# Patient Record
Sex: Female | Born: 1935
Health system: Southern US, Community
[De-identification: ages and names within clinical notes are randomized; demographics above are authoritative.]

## PROBLEM LIST (undated history)

## (undated) DIAGNOSIS — C50919 Malignant neoplasm of unspecified site of unspecified female breast: Secondary | ICD-10-CM

## (undated) DIAGNOSIS — I10 Essential (primary) hypertension: Secondary | ICD-10-CM

## (undated) DIAGNOSIS — K2970 Gastritis, unspecified, without bleeding: Secondary | ICD-10-CM

## (undated) DIAGNOSIS — I639 Cerebral infarction, unspecified: Secondary | ICD-10-CM

## (undated) DIAGNOSIS — E039 Hypothyroidism, unspecified: Secondary | ICD-10-CM

## (undated) DIAGNOSIS — C20 Malignant neoplasm of rectum: Secondary | ICD-10-CM

## (undated) DIAGNOSIS — R011 Cardiac murmur, unspecified: Secondary | ICD-10-CM

## (undated) DIAGNOSIS — T7840XA Allergy, unspecified, initial encounter: Secondary | ICD-10-CM

## (undated) DIAGNOSIS — Z8719 Personal history of other diseases of the digestive system: Secondary | ICD-10-CM

## (undated) DIAGNOSIS — I872 Venous insufficiency (chronic) (peripheral): Secondary | ICD-10-CM

## (undated) DIAGNOSIS — I739 Peripheral vascular disease, unspecified: Secondary | ICD-10-CM

## (undated) DIAGNOSIS — M545 Low back pain, unspecified: Secondary | ICD-10-CM

## (undated) DIAGNOSIS — K589 Irritable bowel syndrome without diarrhea: Secondary | ICD-10-CM

## (undated) DIAGNOSIS — J42 Unspecified chronic bronchitis: Secondary | ICD-10-CM

## (undated) DIAGNOSIS — K5790 Diverticulosis of intestine, part unspecified, without perforation or abscess without bleeding: Secondary | ICD-10-CM

## (undated) DIAGNOSIS — G459 Transient cerebral ischemic attack, unspecified: Secondary | ICD-10-CM

## (undated) DIAGNOSIS — C189 Malignant neoplasm of colon, unspecified: Secondary | ICD-10-CM

## (undated) DIAGNOSIS — Z9289 Personal history of other medical treatment: Secondary | ICD-10-CM

## (undated) DIAGNOSIS — K219 Gastro-esophageal reflux disease without esophagitis: Secondary | ICD-10-CM

## (undated) DIAGNOSIS — M1990 Unspecified osteoarthritis, unspecified site: Secondary | ICD-10-CM

## (undated) DIAGNOSIS — F419 Anxiety disorder, unspecified: Secondary | ICD-10-CM

## (undated) DIAGNOSIS — I509 Heart failure, unspecified: Secondary | ICD-10-CM

## (undated) DIAGNOSIS — M8580 Other specified disorders of bone density and structure, unspecified site: Secondary | ICD-10-CM

## (undated) HISTORY — PX: NISSEN FUNDOPLICATION: SHX2091

## (undated) HISTORY — DX: Transient cerebral ischemic attack, unspecified: G45.9

## (undated) HISTORY — DX: Unspecified osteoarthritis, unspecified site: M19.90

## (undated) HISTORY — PX: MASTECTOMY: SHX3

## (undated) HISTORY — PX: ABDOMINAL HYSTERECTOMY: SHX81

## (undated) HISTORY — PX: BREAST SURGERY: SHX581

## (undated) HISTORY — DX: Low back pain: M54.5

## (undated) HISTORY — DX: Heart failure, unspecified: I50.9

## (undated) HISTORY — DX: Peripheral vascular disease, unspecified: I73.9

## (undated) HISTORY — PX: BACK SURGERY: SHX140

## (undated) HISTORY — DX: Hypothyroidism, unspecified: E03.9

## (undated) HISTORY — DX: Cerebral infarction, unspecified: I63.9

## (undated) HISTORY — DX: Low back pain, unspecified: M54.50

## (undated) HISTORY — DX: Essential (primary) hypertension: I10

## (undated) HISTORY — DX: Irritable bowel syndrome, unspecified: K58.9

## (undated) HISTORY — DX: Unspecified chronic bronchitis: J42

## (undated) HISTORY — DX: Other specified disorders of bone density and structure, unspecified site: M85.80

## (undated) HISTORY — DX: Venous insufficiency (chronic) (peripheral): I87.2

## (undated) HISTORY — DX: Anxiety disorder, unspecified: F41.9

## (undated) HISTORY — DX: Malignant neoplasm of unspecified site of unspecified female breast: C50.919

## (undated) HISTORY — PX: APPENDECTOMY: SHX54

## (undated) HISTORY — PX: JOINT REPLACEMENT: SHX530

## (undated) HISTORY — DX: Gastro-esophageal reflux disease without esophagitis: K21.9

## (undated) HISTORY — PX: EYE SURGERY: SHX253

## (undated) HISTORY — DX: Gastritis, unspecified, without bleeding: K29.70

---

## 1998-11-19 ENCOUNTER — Encounter: Payer: Self-pay | Admitting: Specialist

## 1998-11-19 ENCOUNTER — Ambulatory Visit (HOSPITAL_COMMUNITY): Admission: RE | Admit: 1998-11-19 | Discharge: 1998-11-19 | Payer: Self-pay | Admitting: Specialist

## 1998-12-08 ENCOUNTER — Encounter: Admission: RE | Admit: 1998-12-08 | Discharge: 1998-12-18 | Payer: Self-pay

## 1998-12-10 ENCOUNTER — Encounter: Payer: Self-pay | Admitting: Specialist

## 1998-12-10 ENCOUNTER — Ambulatory Visit (HOSPITAL_COMMUNITY): Admission: RE | Admit: 1998-12-10 | Discharge: 1998-12-10 | Payer: Self-pay | Admitting: Specialist

## 1998-12-24 ENCOUNTER — Encounter: Payer: Self-pay | Admitting: Specialist

## 1998-12-24 ENCOUNTER — Ambulatory Visit (HOSPITAL_COMMUNITY): Admission: RE | Admit: 1998-12-24 | Discharge: 1998-12-24 | Payer: Self-pay | Admitting: Specialist

## 1999-05-14 ENCOUNTER — Ambulatory Visit (HOSPITAL_COMMUNITY): Admission: RE | Admit: 1999-05-14 | Discharge: 1999-05-14 | Payer: Self-pay | Admitting: Specialist

## 1999-05-14 ENCOUNTER — Encounter: Payer: Self-pay | Admitting: Specialist

## 1999-05-31 ENCOUNTER — Encounter: Payer: Self-pay | Admitting: Specialist

## 1999-05-31 ENCOUNTER — Ambulatory Visit (HOSPITAL_COMMUNITY): Admission: RE | Admit: 1999-05-31 | Discharge: 1999-05-31 | Payer: Self-pay | Admitting: Specialist

## 1999-06-15 ENCOUNTER — Encounter: Payer: Self-pay | Admitting: Specialist

## 1999-06-15 ENCOUNTER — Ambulatory Visit: Admission: RE | Admit: 1999-06-15 | Discharge: 1999-06-15 | Payer: Self-pay | Admitting: Specialist

## 1999-09-03 ENCOUNTER — Ambulatory Visit (HOSPITAL_COMMUNITY): Admission: RE | Admit: 1999-09-03 | Discharge: 1999-09-03 | Payer: Self-pay | Admitting: Specialist

## 1999-09-06 ENCOUNTER — Ambulatory Visit (HOSPITAL_COMMUNITY): Admission: RE | Admit: 1999-09-06 | Discharge: 1999-09-06 | Payer: Self-pay | Admitting: Specialist

## 1999-09-06 ENCOUNTER — Encounter: Payer: Self-pay | Admitting: Specialist

## 2000-05-18 ENCOUNTER — Ambulatory Visit (HOSPITAL_COMMUNITY): Admission: RE | Admit: 2000-05-18 | Discharge: 2000-05-18 | Payer: Self-pay | Admitting: Specialist

## 2000-05-18 ENCOUNTER — Encounter: Payer: Self-pay | Admitting: Specialist

## 2000-06-01 ENCOUNTER — Ambulatory Visit: Admission: RE | Admit: 2000-06-01 | Discharge: 2000-06-01 | Payer: Self-pay | Admitting: Specialist

## 2000-06-01 ENCOUNTER — Encounter: Payer: Self-pay | Admitting: Specialist

## 2001-01-01 ENCOUNTER — Encounter: Admission: RE | Admit: 2001-01-01 | Discharge: 2001-01-01 | Payer: Self-pay | Admitting: Specialist

## 2001-01-01 ENCOUNTER — Encounter: Payer: Self-pay | Admitting: Specialist

## 2001-01-16 ENCOUNTER — Encounter: Admission: RE | Admit: 2001-01-16 | Discharge: 2001-01-16 | Payer: Self-pay | Admitting: Specialist

## 2001-01-16 ENCOUNTER — Encounter: Payer: Self-pay | Admitting: Specialist

## 2001-01-30 ENCOUNTER — Encounter: Admission: RE | Admit: 2001-01-30 | Discharge: 2001-01-30 | Payer: Self-pay | Admitting: Specialist

## 2001-01-30 ENCOUNTER — Encounter: Payer: Self-pay | Admitting: Specialist

## 2001-12-05 ENCOUNTER — Encounter: Payer: Self-pay | Admitting: Specialist

## 2001-12-10 ENCOUNTER — Encounter: Payer: Self-pay | Admitting: Specialist

## 2001-12-10 ENCOUNTER — Inpatient Hospital Stay (HOSPITAL_COMMUNITY): Admission: RE | Admit: 2001-12-10 | Discharge: 2001-12-13 | Payer: Self-pay | Admitting: Specialist

## 2003-02-07 ENCOUNTER — Encounter: Payer: Self-pay | Admitting: Gastroenterology

## 2003-08-19 ENCOUNTER — Ambulatory Visit (HOSPITAL_COMMUNITY): Admission: RE | Admit: 2003-08-19 | Discharge: 2003-08-19 | Payer: Self-pay | Admitting: Gastroenterology

## 2003-08-21 ENCOUNTER — Ambulatory Visit (HOSPITAL_COMMUNITY): Admission: RE | Admit: 2003-08-21 | Discharge: 2003-08-21 | Payer: Self-pay | Admitting: Gastroenterology

## 2003-10-06 ENCOUNTER — Inpatient Hospital Stay (HOSPITAL_COMMUNITY): Admission: RE | Admit: 2003-10-06 | Discharge: 2003-10-09 | Payer: Self-pay | Admitting: Surgery

## 2004-05-06 ENCOUNTER — Encounter: Admission: RE | Admit: 2004-05-06 | Discharge: 2004-05-06 | Payer: Self-pay | Admitting: Surgery

## 2004-05-07 ENCOUNTER — Encounter: Admission: RE | Admit: 2004-05-07 | Discharge: 2004-05-07 | Payer: Self-pay | Admitting: Surgery

## 2004-05-11 ENCOUNTER — Encounter: Admission: RE | Admit: 2004-05-11 | Discharge: 2004-05-11 | Payer: Self-pay | Admitting: Surgery

## 2004-05-13 ENCOUNTER — Encounter: Admission: RE | Admit: 2004-05-13 | Discharge: 2004-05-13 | Payer: Self-pay | Admitting: Surgery

## 2004-07-08 ENCOUNTER — Ambulatory Visit: Payer: Self-pay | Admitting: Pulmonary Disease

## 2004-07-15 ENCOUNTER — Ambulatory Visit: Payer: Self-pay | Admitting: Pulmonary Disease

## 2004-08-24 ENCOUNTER — Ambulatory Visit: Payer: Self-pay | Admitting: Pulmonary Disease

## 2004-09-07 ENCOUNTER — Ambulatory Visit: Payer: Self-pay | Admitting: Pulmonary Disease

## 2004-09-13 ENCOUNTER — Ambulatory Visit: Payer: Self-pay | Admitting: Internal Medicine

## 2004-09-15 ENCOUNTER — Encounter: Admission: RE | Admit: 2004-09-15 | Discharge: 2004-09-15 | Payer: Self-pay | Admitting: Specialist

## 2004-09-20 ENCOUNTER — Ambulatory Visit: Payer: Self-pay

## 2004-10-20 ENCOUNTER — Ambulatory Visit: Payer: Self-pay | Admitting: Internal Medicine

## 2004-12-07 ENCOUNTER — Ambulatory Visit: Payer: Self-pay | Admitting: Pulmonary Disease

## 2005-02-08 ENCOUNTER — Ambulatory Visit (HOSPITAL_COMMUNITY): Admission: RE | Admit: 2005-02-08 | Discharge: 2005-02-08 | Payer: Self-pay | Admitting: Surgery

## 2005-02-17 ENCOUNTER — Ambulatory Visit: Payer: Self-pay | Admitting: Pulmonary Disease

## 2005-03-15 ENCOUNTER — Ambulatory Visit: Payer: Self-pay | Admitting: Pulmonary Disease

## 2005-03-17 ENCOUNTER — Ambulatory Visit: Payer: Self-pay | Admitting: Gastroenterology

## 2005-03-21 ENCOUNTER — Ambulatory Visit (HOSPITAL_COMMUNITY): Admission: RE | Admit: 2005-03-21 | Discharge: 2005-03-21 | Payer: Self-pay | Admitting: Gastroenterology

## 2005-03-23 ENCOUNTER — Ambulatory Visit: Payer: Self-pay | Admitting: Gastroenterology

## 2005-04-21 ENCOUNTER — Ambulatory Visit: Payer: Self-pay | Admitting: Pulmonary Disease

## 2005-04-26 ENCOUNTER — Inpatient Hospital Stay (HOSPITAL_COMMUNITY): Admission: RE | Admit: 2005-04-26 | Discharge: 2005-04-28 | Payer: Self-pay | Admitting: Surgery

## 2005-05-20 ENCOUNTER — Ambulatory Visit: Payer: Self-pay | Admitting: Pulmonary Disease

## 2005-05-25 ENCOUNTER — Ambulatory Visit: Payer: Self-pay | Admitting: Cardiology

## 2005-07-05 ENCOUNTER — Ambulatory Visit: Payer: Self-pay | Admitting: Pulmonary Disease

## 2005-08-18 ENCOUNTER — Ambulatory Visit: Payer: Self-pay | Admitting: Pulmonary Disease

## 2005-08-29 ENCOUNTER — Ambulatory Visit: Payer: Self-pay | Admitting: Pulmonary Disease

## 2005-08-31 ENCOUNTER — Ambulatory Visit: Payer: Self-pay

## 2005-09-02 ENCOUNTER — Ambulatory Visit: Payer: Self-pay | Admitting: Pulmonary Disease

## 2005-09-13 ENCOUNTER — Ambulatory Visit: Payer: Self-pay | Admitting: Internal Medicine

## 2005-10-12 ENCOUNTER — Ambulatory Visit: Payer: Self-pay | Admitting: Pulmonary Disease

## 2005-10-17 ENCOUNTER — Ambulatory Visit: Payer: Self-pay | Admitting: Internal Medicine

## 2005-10-25 ENCOUNTER — Ambulatory Visit: Payer: Self-pay | Admitting: Cardiovascular Disease

## 2005-10-28 ENCOUNTER — Ambulatory Visit: Payer: Self-pay | Admitting: Pulmonary Disease

## 2005-10-31 ENCOUNTER — Ambulatory Visit: Payer: Self-pay | Admitting: Cardiology

## 2005-11-04 ENCOUNTER — Ambulatory Visit: Payer: Self-pay | Admitting: Gastroenterology

## 2005-11-18 ENCOUNTER — Ambulatory Visit: Payer: Self-pay | Admitting: Internal Medicine

## 2005-11-29 ENCOUNTER — Ambulatory Visit: Payer: Self-pay

## 2005-12-19 ENCOUNTER — Ambulatory Visit: Payer: Self-pay | Admitting: Pulmonary Disease

## 2006-01-03 ENCOUNTER — Ambulatory Visit: Payer: Self-pay | Admitting: Gastroenterology

## 2006-01-06 ENCOUNTER — Emergency Department (HOSPITAL_COMMUNITY): Admission: EM | Admit: 2006-01-06 | Discharge: 2006-01-06 | Payer: Self-pay | Admitting: *Deleted

## 2006-01-06 ENCOUNTER — Ambulatory Visit: Payer: Self-pay | Admitting: Gastroenterology

## 2006-01-06 DIAGNOSIS — K449 Diaphragmatic hernia without obstruction or gangrene: Secondary | ICD-10-CM | POA: Insufficient documentation

## 2006-01-10 ENCOUNTER — Ambulatory Visit: Payer: Self-pay | Admitting: Gastroenterology

## 2006-01-10 ENCOUNTER — Ambulatory Visit: Payer: Self-pay | Admitting: Pulmonary Disease

## 2006-02-03 ENCOUNTER — Ambulatory Visit: Payer: Self-pay | Admitting: Pulmonary Disease

## 2006-02-10 ENCOUNTER — Ambulatory Visit: Payer: Self-pay | Admitting: Pulmonary Disease

## 2006-03-13 ENCOUNTER — Ambulatory Visit: Payer: Self-pay | Admitting: Pulmonary Disease

## 2006-04-19 ENCOUNTER — Ambulatory Visit: Payer: Self-pay | Admitting: Pulmonary Disease

## 2006-06-08 ENCOUNTER — Ambulatory Visit: Payer: Self-pay | Admitting: Gastroenterology

## 2006-06-29 ENCOUNTER — Ambulatory Visit: Payer: Self-pay | Admitting: Pulmonary Disease

## 2006-07-10 ENCOUNTER — Ambulatory Visit (HOSPITAL_COMMUNITY): Admission: RE | Admit: 2006-07-10 | Discharge: 2006-07-10 | Payer: Self-pay | Admitting: Gastroenterology

## 2006-07-20 ENCOUNTER — Ambulatory Visit: Payer: Self-pay | Admitting: Gastroenterology

## 2006-07-27 ENCOUNTER — Ambulatory Visit: Payer: Self-pay | Admitting: Gastroenterology

## 2006-08-24 ENCOUNTER — Ambulatory Visit: Payer: Self-pay | Admitting: Gastroenterology

## 2006-08-29 LAB — CONVERTED CEMR LAB
Alkaline Phosphatase: 58 units/L (ref 39–117)
BUN: 11 mg/dL (ref 6–23)
CO2: 31 meq/L (ref 19–32)
Creatinine, Ser: 1 mg/dL (ref 0.4–1.2)
Eosinophils Absolute: 0.2 10*3/uL (ref 0.0–0.6)
Eosinophils Relative: 3.2 % (ref 0.0–5.0)
Glucose, Bld: 118 mg/dL — ABNORMAL HIGH (ref 70–99)
HCT: 42.6 % (ref 36.0–46.0)
Hemoglobin: 14.3 g/dL (ref 12.0–15.0)
Lymphocytes Relative: 36.2 % (ref 12.0–46.0)
MCV: 95.1 fL (ref 78.0–100.0)
Monocytes Absolute: 0.5 10*3/uL (ref 0.2–0.7)
Neutro Abs: 2.9 10*3/uL (ref 1.4–7.7)
Neutrophils Relative %: 51.1 % (ref 43.0–77.0)
Potassium: 4.2 meq/L (ref 3.5–5.1)
Sodium: 145 meq/L (ref 135–145)
Total Bilirubin: 0.6 mg/dL (ref 0.3–1.2)
Total Protein: 6.4 g/dL (ref 6.0–8.3)
Transferrin: 267.8 mg/dL (ref >=212.0)
WBC: 5.6 10*3/uL (ref 4.5–10.5)

## 2006-09-04 ENCOUNTER — Ambulatory Visit: Payer: Self-pay | Admitting: Gastroenterology

## 2006-09-12 ENCOUNTER — Ambulatory Visit: Payer: Self-pay | Admitting: Pulmonary Disease

## 2006-11-28 ENCOUNTER — Ambulatory Visit: Payer: Self-pay | Admitting: Pulmonary Disease

## 2006-12-04 ENCOUNTER — Ambulatory Visit: Payer: Self-pay | Admitting: Gastroenterology

## 2006-12-07 ENCOUNTER — Ambulatory Visit: Payer: Self-pay | Admitting: Gastroenterology

## 2006-12-21 ENCOUNTER — Ambulatory Visit: Payer: Self-pay | Admitting: Pulmonary Disease

## 2006-12-26 ENCOUNTER — Ambulatory Visit: Payer: Self-pay | Admitting: Internal Medicine

## 2007-01-03 ENCOUNTER — Inpatient Hospital Stay (HOSPITAL_COMMUNITY): Admission: AD | Admit: 2007-01-03 | Discharge: 2007-01-10 | Payer: Self-pay | Admitting: Pulmonary Disease

## 2007-01-03 ENCOUNTER — Ambulatory Visit: Payer: Self-pay | Admitting: Cardiovascular Disease

## 2007-01-03 ENCOUNTER — Ambulatory Visit: Payer: Self-pay | Admitting: Pulmonary Disease

## 2007-01-04 ENCOUNTER — Encounter: Payer: Self-pay | Admitting: Pulmonary Disease

## 2007-01-08 ENCOUNTER — Ambulatory Visit: Payer: Self-pay | Admitting: Vascular Surgery

## 2007-01-08 ENCOUNTER — Encounter: Payer: Self-pay | Admitting: Internal Medicine

## 2007-01-17 ENCOUNTER — Ambulatory Visit: Payer: Self-pay | Admitting: Pulmonary Disease

## 2007-01-31 ENCOUNTER — Ambulatory Visit: Payer: Self-pay

## 2007-02-02 ENCOUNTER — Ambulatory Visit: Payer: Self-pay | Admitting: Cardiovascular Disease

## 2007-02-07 ENCOUNTER — Ambulatory Visit: Payer: Self-pay | Admitting: Pulmonary Disease

## 2007-03-07 ENCOUNTER — Ambulatory Visit: Payer: Self-pay | Admitting: Pulmonary Disease

## 2007-03-13 ENCOUNTER — Ambulatory Visit: Payer: Self-pay | Admitting: Pulmonary Disease

## 2007-03-13 LAB — CONVERTED CEMR LAB
Bilirubin Urine: NEGATIVE
Crystals: NEGATIVE
Nitrite: NEGATIVE
Specific Gravity, Urine: 1.01 (ref 1.000–1.03)

## 2007-04-23 DIAGNOSIS — E039 Hypothyroidism, unspecified: Secondary | ICD-10-CM

## 2007-04-23 DIAGNOSIS — I519 Heart disease, unspecified: Secondary | ICD-10-CM

## 2007-04-23 DIAGNOSIS — K589 Irritable bowel syndrome without diarrhea: Secondary | ICD-10-CM

## 2007-04-23 DIAGNOSIS — M545 Low back pain: Secondary | ICD-10-CM

## 2007-04-23 DIAGNOSIS — F411 Generalized anxiety disorder: Secondary | ICD-10-CM | POA: Insufficient documentation

## 2007-04-23 DIAGNOSIS — K219 Gastro-esophageal reflux disease without esophagitis: Secondary | ICD-10-CM | POA: Insufficient documentation

## 2007-04-23 DIAGNOSIS — I10 Essential (primary) hypertension: Secondary | ICD-10-CM | POA: Insufficient documentation

## 2007-04-23 DIAGNOSIS — M1990 Unspecified osteoarthritis, unspecified site: Secondary | ICD-10-CM | POA: Insufficient documentation

## 2007-04-23 DIAGNOSIS — G459 Transient cerebral ischemic attack, unspecified: Secondary | ICD-10-CM | POA: Insufficient documentation

## 2007-05-15 ENCOUNTER — Ambulatory Visit: Payer: Self-pay | Admitting: Pulmonary Disease

## 2007-06-12 ENCOUNTER — Telehealth: Payer: Self-pay | Admitting: Pulmonary Disease

## 2007-07-05 HISTORY — PX: TOTAL KNEE ARTHROPLASTY: SHX125

## 2007-07-27 ENCOUNTER — Inpatient Hospital Stay (HOSPITAL_COMMUNITY): Admission: RE | Admit: 2007-07-27 | Discharge: 2007-08-02 | Payer: Self-pay | Admitting: Orthopaedic Surgery

## 2007-10-16 ENCOUNTER — Ambulatory Visit: Payer: Self-pay | Admitting: Pulmonary Disease

## 2007-10-16 DIAGNOSIS — J42 Unspecified chronic bronchitis: Secondary | ICD-10-CM

## 2007-10-16 DIAGNOSIS — M899 Disorder of bone, unspecified: Secondary | ICD-10-CM | POA: Insufficient documentation

## 2007-10-16 DIAGNOSIS — M949 Disorder of cartilage, unspecified: Secondary | ICD-10-CM

## 2007-10-16 DIAGNOSIS — C50919 Malignant neoplasm of unspecified site of unspecified female breast: Secondary | ICD-10-CM | POA: Insufficient documentation

## 2007-10-16 DIAGNOSIS — I872 Venous insufficiency (chronic) (peripheral): Secondary | ICD-10-CM | POA: Insufficient documentation

## 2007-10-16 LAB — CONVERTED CEMR LAB
ALT: 14 units/L (ref 0–35)
Albumin: 3.7 g/dL (ref 3.5–5.2)
Alkaline Phosphatase: 57 units/L (ref 39–117)
BUN: 11 mg/dL (ref 6–23)
Calcium: 10.1 mg/dL (ref 8.4–10.5)
Eosinophils Relative: 2.5 % (ref 0.0–5.0)
GFR calc Af Amer: 89 mL/min
Glucose, Bld: 92 mg/dL (ref 70–99)
HCT: 43.4 % (ref 36.0–46.0)
Hemoglobin: 14.4 g/dL (ref 12.0–15.0)
Monocytes Absolute: 0.7 10*3/uL (ref 0.1–1.0)
Monocytes Relative: 10.4 % (ref 3.0–12.0)
Neutro Abs: 2.9 10*3/uL (ref 1.4–7.7)
RBC: 4.59 M/uL (ref 3.87–5.11)
Saturation Ratios: 31.6 % (ref 20.0–50.0)
Total Protein: 6.6 g/dL (ref 6.0–8.3)
Transferrin: 257.6 mg/dL (ref >=212.0)
WBC: 6.4 10*3/uL (ref 4.5–10.5)

## 2007-10-30 ENCOUNTER — Ambulatory Visit: Payer: Self-pay | Admitting: Gastroenterology

## 2007-11-29 DIAGNOSIS — K2990 Gastroduodenitis, unspecified, without bleeding: Secondary | ICD-10-CM

## 2007-11-29 DIAGNOSIS — K2970 Gastritis, unspecified, without bleeding: Secondary | ICD-10-CM | POA: Insufficient documentation

## 2007-12-04 ENCOUNTER — Ambulatory Visit: Payer: Self-pay | Admitting: Gastroenterology

## 2007-12-10 ENCOUNTER — Encounter: Payer: Self-pay | Admitting: Gastroenterology

## 2007-12-10 ENCOUNTER — Ambulatory Visit: Payer: Self-pay | Admitting: Gastroenterology

## 2007-12-26 ENCOUNTER — Encounter: Payer: Self-pay | Admitting: Gastroenterology

## 2007-12-28 ENCOUNTER — Telehealth: Payer: Self-pay | Admitting: Gastroenterology

## 2008-01-01 ENCOUNTER — Ambulatory Visit (HOSPITAL_COMMUNITY): Admission: RE | Admit: 2008-01-01 | Discharge: 2008-01-01 | Payer: Self-pay | Admitting: Gastroenterology

## 2008-01-03 ENCOUNTER — Telehealth (INDEPENDENT_AMBULATORY_CARE_PROVIDER_SITE_OTHER): Payer: Self-pay | Admitting: *Deleted

## 2008-01-28 ENCOUNTER — Encounter: Payer: Self-pay | Admitting: Pulmonary Disease

## 2008-02-04 ENCOUNTER — Telehealth: Payer: Self-pay | Admitting: Gastroenterology

## 2008-02-15 ENCOUNTER — Telehealth: Payer: Self-pay | Admitting: Gastroenterology

## 2008-03-07 ENCOUNTER — Telehealth (INDEPENDENT_AMBULATORY_CARE_PROVIDER_SITE_OTHER): Payer: Self-pay | Admitting: *Deleted

## 2008-03-17 ENCOUNTER — Ambulatory Visit: Payer: Self-pay | Admitting: Pulmonary Disease

## 2008-03-17 DIAGNOSIS — I739 Peripheral vascular disease, unspecified: Secondary | ICD-10-CM | POA: Insufficient documentation

## 2008-03-28 ENCOUNTER — Ambulatory Visit (HOSPITAL_COMMUNITY): Admission: RE | Admit: 2008-03-28 | Discharge: 2008-03-28 | Payer: Self-pay | Admitting: Surgery

## 2008-04-01 ENCOUNTER — Telehealth: Payer: Self-pay | Admitting: Gastroenterology

## 2008-04-03 ENCOUNTER — Ambulatory Visit: Payer: Self-pay | Admitting: Gastroenterology

## 2008-04-07 ENCOUNTER — Encounter: Payer: Self-pay | Admitting: Gastroenterology

## 2008-04-07 ENCOUNTER — Ambulatory Visit: Payer: Self-pay | Admitting: Gastroenterology

## 2008-04-08 ENCOUNTER — Encounter: Payer: Self-pay | Admitting: Gastroenterology

## 2008-04-09 ENCOUNTER — Ambulatory Visit: Payer: Self-pay | Admitting: Pulmonary Disease

## 2008-04-21 ENCOUNTER — Telehealth: Payer: Self-pay | Admitting: Gastroenterology

## 2008-05-08 ENCOUNTER — Telehealth: Payer: Self-pay | Admitting: Gastroenterology

## 2008-08-15 ENCOUNTER — Ambulatory Visit (HOSPITAL_COMMUNITY): Admission: RE | Admit: 2008-08-15 | Discharge: 2008-08-15 | Payer: Self-pay | Admitting: Specialist

## 2008-08-25 ENCOUNTER — Telehealth (INDEPENDENT_AMBULATORY_CARE_PROVIDER_SITE_OTHER): Payer: Self-pay | Admitting: *Deleted

## 2008-08-25 ENCOUNTER — Encounter: Payer: Self-pay | Admitting: Pulmonary Disease

## 2008-09-16 ENCOUNTER — Ambulatory Visit: Payer: Self-pay | Admitting: Pulmonary Disease

## 2008-09-18 ENCOUNTER — Ambulatory Visit: Payer: Self-pay | Admitting: Pulmonary Disease

## 2008-09-23 LAB — CONVERTED CEMR LAB
AST: 21 units/L (ref 0–37)
Albumin: 3.5 g/dL (ref 3.5–5.2)
BUN: 15 mg/dL (ref 6–23)
Basophils Relative: 0.9 % (ref 0.0–3.0)
Bilirubin Urine: NEGATIVE
Calcium: 9.5 mg/dL (ref 8.4–10.5)
Cholesterol: 152 mg/dL (ref 0–200)
Eosinophils Relative: 2 % (ref 0.0–5.0)
GFR calc non Af Amer: 63.81 mL/min (ref >60)
HCT: 44.4 % (ref 36.0–46.0)
HDL: 63.8 mg/dL (ref >39.00)
Hemoglobin: 15.2 g/dL — ABNORMAL HIGH (ref 12.0–15.0)
LDL Cholesterol: 78 mg/dL (ref 0–99)
Lymphs Abs: 2.8 10*3/uL (ref 0.7–4.0)
MCV: 94.6 fL (ref 78.0–100.0)
Monocytes Absolute: 0.5 10*3/uL (ref 0.1–1.0)
Monocytes Relative: 7.5 % (ref 3.0–12.0)
Neutro Abs: 2.7 10*3/uL (ref 1.4–7.7)
Platelets: 154 10*3/uL (ref 150.0–400.0)
Potassium: 4.1 meq/L (ref 3.5–5.1)
RBC: 4.69 M/uL (ref 3.87–5.11)
Specific Gravity, Urine: 1.025 (ref 1.000–1.030)
Total Bilirubin: 0.6 mg/dL (ref 0.3–1.2)
Total Protein, Urine: NEGATIVE mg/dL
Urine Glucose: NEGATIVE mg/dL
VLDL: 10.4 mg/dL (ref 0.0–40.0)
Vit D, 25-Hydroxy: 21 ng/mL — ABNORMAL LOW (ref 30–89)
WBC: 6.2 10*3/uL (ref 4.5–10.5)
pH: 6 (ref 5.0–8.0)

## 2008-10-14 ENCOUNTER — Encounter: Payer: Self-pay | Admitting: Pulmonary Disease

## 2008-11-13 ENCOUNTER — Telehealth (INDEPENDENT_AMBULATORY_CARE_PROVIDER_SITE_OTHER): Payer: Self-pay | Admitting: *Deleted

## 2008-11-18 ENCOUNTER — Telehealth: Payer: Self-pay | Admitting: Pulmonary Disease

## 2008-11-20 ENCOUNTER — Telehealth (INDEPENDENT_AMBULATORY_CARE_PROVIDER_SITE_OTHER): Payer: Self-pay | Admitting: *Deleted

## 2008-12-05 ENCOUNTER — Ambulatory Visit (HOSPITAL_COMMUNITY): Admission: RE | Admit: 2008-12-05 | Discharge: 2008-12-06 | Payer: Self-pay | Admitting: Specialist

## 2009-02-09 ENCOUNTER — Ambulatory Visit (HOSPITAL_COMMUNITY): Admission: RE | Admit: 2009-02-09 | Discharge: 2009-02-09 | Payer: Self-pay | Admitting: Specialist

## 2009-02-18 ENCOUNTER — Encounter: Payer: Self-pay | Admitting: Pulmonary Disease

## 2009-03-11 ENCOUNTER — Telehealth: Payer: Self-pay | Admitting: Gastroenterology

## 2009-03-13 ENCOUNTER — Encounter: Payer: Self-pay | Admitting: Pulmonary Disease

## 2009-03-17 ENCOUNTER — Ambulatory Visit: Payer: Self-pay | Admitting: Pulmonary Disease

## 2009-03-25 ENCOUNTER — Encounter: Payer: Self-pay | Admitting: Pulmonary Disease

## 2009-03-30 ENCOUNTER — Telehealth: Payer: Self-pay | Admitting: Pulmonary Disease

## 2009-03-31 ENCOUNTER — Encounter: Payer: Self-pay | Admitting: Pulmonary Disease

## 2009-04-01 ENCOUNTER — Ambulatory Visit: Payer: Self-pay | Admitting: Pulmonary Disease

## 2009-04-08 ENCOUNTER — Encounter: Payer: Self-pay | Admitting: Pulmonary Disease

## 2009-05-03 ENCOUNTER — Emergency Department (HOSPITAL_COMMUNITY): Admission: EM | Admit: 2009-05-03 | Discharge: 2009-05-03 | Payer: Self-pay | Admitting: Emergency Medicine

## 2009-06-10 ENCOUNTER — Telehealth: Payer: Self-pay | Admitting: Pulmonary Disease

## 2009-06-12 ENCOUNTER — Encounter: Payer: Self-pay | Admitting: Pulmonary Disease

## 2009-07-24 ENCOUNTER — Encounter: Payer: Self-pay | Admitting: Pulmonary Disease

## 2009-09-15 ENCOUNTER — Ambulatory Visit: Payer: Self-pay | Admitting: Pulmonary Disease

## 2009-09-16 ENCOUNTER — Encounter: Admission: RE | Admit: 2009-09-16 | Discharge: 2009-09-16 | Payer: Self-pay | Admitting: Specialist

## 2009-09-20 LAB — CONVERTED CEMR LAB
ALT: 20 units/L (ref 0–35)
Albumin: 4 g/dL (ref 3.5–5.2)
Alkaline Phosphatase: 56 units/L (ref 39–117)
Basophils Absolute: 0 10*3/uL (ref 0.0–0.1)
Bilirubin, Direct: 0.2 mg/dL (ref 0.0–0.3)
CO2: 32 meq/L (ref 19–32)
Calcium: 10 mg/dL (ref 8.4–10.5)
Chloride: 103 meq/L (ref 96–112)
Creatinine, Ser: 0.7 mg/dL (ref 0.4–1.2)
Eosinophils Absolute: 0.2 10*3/uL (ref 0.0–0.7)
Glucose, Bld: 104 mg/dL — ABNORMAL HIGH (ref 70–99)
HDL: 91.6 mg/dL (ref >39.00)
Hemoglobin: 15.9 g/dL — ABNORMAL HIGH (ref 12.0–15.0)
Lymphocytes Relative: 35.3 % (ref 12.0–46.0)
MCHC: 32.7 g/dL (ref 30.0–36.0)
Monocytes Relative: 8 % (ref 3.0–12.0)
Neutrophils Relative %: 53.6 % (ref 43.0–77.0)
RDW: 14.4 % (ref 11.5–14.6)
Sodium: 141 meq/L (ref 135–145)
TSH: 2.37 microintl units/mL (ref 0.35–5.50)
Total CHOL/HDL Ratio: 2
Total Protein: 7.2 g/dL (ref 6.0–8.3)
Triglycerides: 76 mg/dL (ref 0.0–149.0)
Vit D, 25-Hydroxy: 15 ng/mL — ABNORMAL LOW (ref 30–89)

## 2009-09-21 ENCOUNTER — Emergency Department (HOSPITAL_COMMUNITY): Admission: EM | Admit: 2009-09-21 | Discharge: 2009-09-21 | Payer: Self-pay | Admitting: Emergency Medicine

## 2009-09-24 ENCOUNTER — Emergency Department (HOSPITAL_COMMUNITY): Admission: EM | Admit: 2009-09-24 | Discharge: 2009-09-24 | Payer: Self-pay | Admitting: Emergency Medicine

## 2009-09-28 ENCOUNTER — Ambulatory Visit: Payer: Self-pay | Admitting: Pulmonary Disease

## 2009-10-20 ENCOUNTER — Encounter: Payer: Self-pay | Admitting: Pulmonary Disease

## 2009-11-02 ENCOUNTER — Encounter: Admission: RE | Admit: 2009-11-02 | Discharge: 2009-11-02 | Payer: Self-pay | Admitting: Specialist

## 2009-12-07 ENCOUNTER — Ambulatory Visit (HOSPITAL_COMMUNITY): Admission: RE | Admit: 2009-12-07 | Discharge: 2009-12-07 | Payer: Self-pay | Admitting: Orthopedic Surgery

## 2010-01-16 ENCOUNTER — Encounter: Payer: Self-pay | Admitting: Pulmonary Disease

## 2010-02-11 ENCOUNTER — Encounter: Payer: Self-pay | Admitting: Pulmonary Disease

## 2010-03-16 ENCOUNTER — Ambulatory Visit: Payer: Self-pay | Admitting: Pulmonary Disease

## 2010-04-08 ENCOUNTER — Encounter: Payer: Self-pay | Admitting: Pulmonary Disease

## 2010-06-22 ENCOUNTER — Telehealth (INDEPENDENT_AMBULATORY_CARE_PROVIDER_SITE_OTHER): Payer: Self-pay | Admitting: *Deleted

## 2010-07-01 ENCOUNTER — Ambulatory Visit
Admission: RE | Admit: 2010-07-01 | Discharge: 2010-07-01 | Payer: Self-pay | Source: Home / Self Care | Attending: Pulmonary Disease | Admitting: Pulmonary Disease

## 2010-07-01 ENCOUNTER — Ambulatory Visit: Payer: Self-pay | Admitting: Pulmonary Disease

## 2010-07-24 ENCOUNTER — Encounter: Payer: Self-pay | Admitting: Surgery

## 2010-07-26 ENCOUNTER — Encounter: Payer: Self-pay | Admitting: Specialist
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
## 2010-08-05 NOTE — Progress Notes (Signed)
Summary: req to see dr Kriste Basque - pt scheduled 12/29 with SN  Phone Note Call from Patient Call back at Home Phone [phone]   Caller: Patient Call For: nadel Summary of Call: pt c/o non-productive cough x 1 month. requests to see dr Kriste Basque next wk. (only dr nadel per ot). no fever, no N or V. nothing OTC has helped.  Initial call taken by: Tivis Ringer, CNA,  June 22, 2010 10:03 AM  Follow-up for Phone Call        Called, spoke with pt.  She c/o nonprod cough and chest tightness x 1 1/2 months.  Also states she has some increased SOB x 2wks.  Denies fever.  Has tried Robitussion and cough drops with no relief.  States "I don't feel good about what's going" -- would prefer to see SN for this.    ** Note:  Pt states in Oct Dr. Lequita Halt ordered a "body scan." States at the time of the scan the lady who done the scan told her she saw something in her chest and tried to get intouch with Aluisio.  Per pt, she was unable to contact him and told pt they would call her back regarding this but she never received call back.  Dr. Ernest Mallick, pls advise if pt can be worked in.  Thanks! Follow-up by: Gweneth Dimitri RN,  June 22, 2010 11:24 AM  Additional Follow-up for Phone Call Additional follow up Details #1::        per SN---pt will need ov next week   ok to add her on  12-29 AT 11:30.  THANKS Randell Loop CMA  June 22, 2010 12:12 PM    OV scheduled in [place] -- ATC pt but NA and unable to leave message.  [facility] patient called back this morning stated she forgot to turn her answering machine on yesterday and would like a call back today at [phone] Additional Follow-up by: Gweneth Dimitri RN,  June 22, 2010 1:56 PM    Additional Follow-up for Phone Call Additional follow up Details #2::    pt aware of apt and verbalized understanding og her apt time and date Carver Fila  June 23, 2010 5:36 PM
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
## 2010-08-05 NOTE — Op Note (Signed)
Summary: Richard Ramos,MD  Richard Ramos,MD   Imported By: Lester [place] 08/01/2009 11:06:31  _____________________________________________________________________  External Attachment:    Type:   Image     Comment:   External Document

## 2010-08-05 NOTE — Letter (Signed)
Summary: [facility]  [facility]   Imported By: Lennie Odor 04/22/2010 14:29:10  _____________________________________________________________________  External Attachment:    Type:   Image     Comment:   External Document

## 2010-08-05 NOTE — Consult Note (Signed)
Summary: [name], [name]  [name], [name]   Imported By: Lester [place] 01/14/2010 08:59:24  _____________________________________________________________________  External Attachment:    Type:   Image     Comment:   External Document

## 2010-08-05 NOTE — Letter (Signed)
Summary: [facility]   Imported By: Sherian Rein 02/03/2010 08:23:30  _____________________________________________________________________  External Attachment:    Type:   Image     Comment:   External Document

## 2010-08-05 NOTE — Letter (Signed)
Summary: [facility]  [facility]   Imported By: Sherian Rein 02/25/2010 09:17:36  _____________________________________________________________________  External Attachment:    Type:   Image     Comment:   External Document
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
## 2010-08-12 ENCOUNTER — Encounter: Payer: Self-pay | Admitting: Pulmonary Disease

## 2010-08-12 ENCOUNTER — Ambulatory Visit (INDEPENDENT_AMBULATORY_CARE_PROVIDER_SITE_OTHER): Payer: Medicare Other | Admitting: Pulmonary Disease

## 2010-08-12 DIAGNOSIS — K589 Irritable bowel syndrome without diarrhea: Secondary | ICD-10-CM

## 2010-08-12 DIAGNOSIS — I739 Peripheral vascular disease, unspecified: Secondary | ICD-10-CM

## 2010-08-12 DIAGNOSIS — R05 Cough: Secondary | ICD-10-CM

## 2010-08-12 DIAGNOSIS — J42 Unspecified chronic bronchitis: Secondary | ICD-10-CM

## 2010-08-12 DIAGNOSIS — I872 Venous insufficiency (chronic) (peripheral): Secondary | ICD-10-CM

## 2010-08-12 DIAGNOSIS — I509 Heart failure, unspecified: Secondary | ICD-10-CM

## 2010-08-12 DIAGNOSIS — I10 Essential (primary) hypertension: Secondary | ICD-10-CM

## 2010-08-12 DIAGNOSIS — K219 Gastro-esophageal reflux disease without esophagitis: Secondary | ICD-10-CM
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
## 2010-09-06 ENCOUNTER — Telehealth (INDEPENDENT_AMBULATORY_CARE_PROVIDER_SITE_OTHER): Payer: Self-pay | Admitting: *Deleted

## 2010-09-13 ENCOUNTER — Encounter (INDEPENDENT_AMBULATORY_CARE_PROVIDER_SITE_OTHER): Payer: Self-pay | Admitting: *Deleted

## 2010-09-13 ENCOUNTER — Other Ambulatory Visit: Payer: Self-pay | Admitting: Pulmonary Disease

## 2010-09-13 ENCOUNTER — Other Ambulatory Visit: Payer: Medicare Other

## 2010-09-13 ENCOUNTER — Encounter: Payer: Self-pay | Admitting: Pulmonary Disease

## 2010-09-13 DIAGNOSIS — E7800 Pure hypercholesterolemia, unspecified: Secondary | ICD-10-CM

## 2010-09-13 DIAGNOSIS — E785 Hyperlipidemia, unspecified: Secondary | ICD-10-CM

## 2010-09-13 DIAGNOSIS — R748 Abnormal levels of other serum enzymes: Secondary | ICD-10-CM

## 2010-09-13 DIAGNOSIS — E039 Hypothyroidism, unspecified: Secondary | ICD-10-CM

## 2010-09-13 DIAGNOSIS — D649 Anemia, unspecified: Secondary | ICD-10-CM

## 2010-09-13 DIAGNOSIS — I10 Essential (primary) hypertension: Secondary | ICD-10-CM

## 2010-09-13 LAB — BASIC METABOLIC PANEL
BUN: 14 mg/dL (ref 6–23)
CO2: 31 mEq/L (ref 19–32)
Calcium: 9.9 mg/dL (ref 8.4–10.5)
Chloride: 101 mEq/L (ref 96–112)
Creatinine, Ser: 0.9 mg/dL (ref 0.4–1.2)
Glucose, Bld: 122 mg/dL — ABNORMAL HIGH (ref 70–99)

## 2010-09-13 LAB — LDL CHOLESTEROL, DIRECT: Direct LDL: 84 mg/dL

## 2010-09-13 LAB — HEPATIC FUNCTION PANEL
ALT: 20 U/L (ref 0–35)
Albumin: 4 g/dL (ref 3.5–5.2)
Bilirubin, Direct: 0.1 mg/dL (ref 0.0–0.3)
Total Protein: 6.7 g/dL (ref 6.0–8.3)

## 2010-09-13 LAB — CBC WITH DIFFERENTIAL/PLATELET
Basophils Absolute: 0 10*3/uL (ref 0.0–0.1)
Basophils Relative: 0.2 % (ref 0.0–3.0)
Eosinophils Absolute: 0.1 10*3/uL (ref 0.0–0.7)
Lymphocytes Relative: 16.7 % (ref 12.0–46.0)
MCHC: 33.6 g/dL (ref 30.0–36.0)
Monocytes Relative: 3.4 % (ref 3.0–12.0)
Neutrophils Relative %: 78.8 % — ABNORMAL HIGH (ref 43.0–77.0)
RBC: 5 Mil/uL (ref 3.87–5.11)
WBC: 9.1 10*3/uL (ref 4.5–10.5)

## 2010-09-13 LAB — LIPID PANEL
Cholesterol: 203 mg/dL — ABNORMAL HIGH (ref 0–200)
Triglycerides: 137 mg/dL (ref 0.0–149.0)

## 2010-09-14 NOTE — Progress Notes (Signed)
Summary: lab order  Phone Note Call from Patient Call back at Home Phone [phone]   Caller: Patient Call For: nadel Reason for Call: Talk to Doctor Summary of Call: Patient has an upcoming appt w/ Dr. Kriste Basque and would like to go ahead and get labs drawn.  Asking for order. Initial call taken by: Lehman Prom,  September 06, 2010 1:40 PM  Follow-up for Phone Call        Pt has pending appt with SN on March 14.  Requesting to come get labs drawn sometime next week.  Dr. Kriste Basque, pls advise what labs pt needs.  Thanks! Gweneth Dimitri RN  September 06, 2010 4:58 PM   Additional Follow-up for Phone Call Additional follow up Details #1::        per SN----lip-272.0/bmp-401.9/hepat-790.5/cbcd-285.9/tsh-244.9/vit d-733.00.  thanks Randell Loop CMA  September 07, 2010 9:16 AM     Additional Follow-up for Phone Call Additional follow up Details #2::    Spoke with pt orders placed in Epic for labs to be drawn on 09-13-10. Abigail Miyamoto RN  September 07, 2010 9:45 AM

## 2010-09-15 ENCOUNTER — Ambulatory Visit (INDEPENDENT_AMBULATORY_CARE_PROVIDER_SITE_OTHER): Payer: Medicare Other | Admitting: Pulmonary Disease

## 2010-09-15 ENCOUNTER — Encounter: Payer: Self-pay | Admitting: Pulmonary Disease

## 2010-09-15 ENCOUNTER — Telehealth: Payer: Self-pay | Admitting: Pulmonary Disease

## 2010-09-15 DIAGNOSIS — J42 Unspecified chronic bronchitis: Secondary | ICD-10-CM

## 2010-09-15 DIAGNOSIS — K219 Gastro-esophageal reflux disease without esophagitis: Secondary | ICD-10-CM

## 2010-09-15 DIAGNOSIS — I739 Peripheral vascular disease, unspecified: Secondary | ICD-10-CM

## 2010-09-15 DIAGNOSIS — I872 Venous insufficiency (chronic) (peripheral): Secondary | ICD-10-CM

## 2010-09-15 DIAGNOSIS — K589 Irritable bowel syndrome without diarrhea: Secondary | ICD-10-CM

## 2010-09-15 DIAGNOSIS — I10 Essential (primary) hypertension: Secondary | ICD-10-CM

## 2010-09-15 DIAGNOSIS — I509 Heart failure, unspecified: Secondary | ICD-10-CM

## 2010-09-15 DIAGNOSIS — R05 Cough: Secondary | ICD-10-CM

## 2010-09-19 LAB — CONVERTED CEMR LAB: Vit D, 25-Hydroxy: 43 ng/mL (ref 30–89)

## 2010-09-21 NOTE — Progress Notes (Signed)
Summary: wants to know if alprazolam refill can be called [facility]  Phone Note Call from Patient   Caller: Patient Call For: Kriste Basque Summary of Call: Patient phoned stated that she saw Dr Kriste Basque this morning and they were going to call a prescription in for her and she has another one that she wants to know if it can be called into [facility] for her Alprazolam 0.5 mg. she can be reached at [phone] Initial call taken by: Vedia Coffer,  September 15, 2010 3:11 PM  Follow-up for Phone Call        Pt needs new RX for Plavix sent to [facility] and this has been printed and placed on SN cart for signature. Pls advise on refills for Alprazolam to [facility] per pt request.Lori Excell Seltzer [facility]  September 15, 2010 4:37 PM    Prescriptions: PLAVIX 75 MG  TABS (CLOPIDOGREL BISULFATE) Take 1 tablet by mouth once a day  #90 x 4   Entered by:   Randell Loop CMA   Authorized by:   Michele Mcalpine MD   Signed by:   Randell Loop CMA on 09/16/2010   Method used:   Faxed to ...       [facility] ([facility])       [address] [place], [place]  [address]       Ph: [phone]       Fax: [phone]   RxID:   [id number] PLAVIX 75 MG  TABS (CLOPIDOGREL BISULFATE) Take 1 tablet by mouth once a day  #90 x 4   Entered by:   Michel Bickers CMA   Authorized by:   Michele Mcalpine MD   Signed by:   Michel Bickers CMA on 09/15/2010   Method used:   Print then Give to Patient   RxID:   [id number]  rx for the plavix has been faxed to the pharmacy per pts request and the refill for the alprazolam has been sent to the local pharmacy Randell Loop CMA  September 16, 2010 12:07 PM
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
## 2010-10-07 LAB — POCT I-STAT, CHEM 8
BUN: 14 mg/dL (ref 6–23)
Calcium, Ion: 1.16 mmol/L (ref 1.12–1.32)
Creatinine, Ser: 0.9 mg/dL (ref 0.4–1.2)
TCO2: 28 mmol/L (ref 0–100)

## 2010-10-07 LAB — TYPE AND SCREEN: ABO/RH(D): A NEG

## 2010-10-11 ENCOUNTER — Telehealth: Payer: Self-pay | Admitting: Pulmonary Disease

## 2010-10-11 LAB — CBC
HCT: 44.9 % (ref 36.0–46.0)
HCT: 50.2 % — ABNORMAL HIGH (ref 36.0–46.0)
Hemoglobin: 15.3 g/dL — ABNORMAL HIGH (ref 12.0–15.0)
MCHC: 33.7 g/dL (ref 30.0–36.0)
MCHC: 34 g/dL (ref 30.0–36.0)
Platelets: 175 10*3/uL (ref 150–400)
RBC: 4.62 MIL/uL (ref 3.87–5.11)
RDW: 14.1 % (ref 11.5–15.5)
RDW: 14.6 % (ref 11.5–15.5)

## 2010-10-11 LAB — COMPREHENSIVE METABOLIC PANEL
Albumin: 4 g/dL (ref 3.5–5.2)
BUN: 15 mg/dL (ref 6–23)
Calcium: 10.3 mg/dL (ref 8.4–10.5)
Creatinine, Ser: 0.91 mg/dL (ref 0.4–1.2)
Potassium: 4.3 mEq/L (ref 3.5–5.1)
Total Protein: 7 g/dL (ref 6.0–8.3)

## 2010-10-11 LAB — DIFFERENTIAL
Lymphocytes Relative: 36 % (ref 12–46)
Lymphs Abs: 2.7 10*3/uL (ref 0.7–4.0)
Monocytes Absolute: 0.7 10*3/uL (ref 0.1–1.0)
Monocytes Relative: 10 % (ref 3–12)
Neutro Abs: 4 10*3/uL (ref 1.7–7.7)

## 2010-10-11 MED ORDER — DICLOFENAC SODIUM 1 % TD GEL: TRANSDERMAL | Status: DC

## 2010-10-11 MED ORDER — RALOXIFENE HCL 60 MG PO TABS: 60.0000 mg | ORAL_TABLET | Freq: Every day | ORAL | Status: DC

## 2010-10-11 MED ORDER — IMIPRAMINE HCL 25 MG PO TABS: 25.0000 mg | ORAL_TABLET | Freq: Two times a day (BID) | ORAL | Status: DC

## 2010-10-11 MED ORDER — METOPROLOL SUCCINATE ER 50 MG PO TB24: 50.0000 mg | ORAL_TABLET | Freq: Every day | ORAL | Status: DC

## 2010-10-11 MED ORDER — SUCRALFATE 1 GM/10ML PO SUSP: ORAL | Status: DC

## 2010-10-11 NOTE — Telephone Encounter (Signed)
Called, spoke with pt.  States she received a call from [facility] stating they need additional information on her order from Dr. Jodelle Green office.  She did not know what this was regarding.  Advised I would call to see and call her back.  Called [facility], spoke with Reva.  Per Reva, they received several rx requests but they were either expired or needed additional refills.  Rxs are for evista, metoprolol succinate, impramine, carafate, and voltaren.   Rxs sent - LMTCB to inform pt.

## 2010-10-11 NOTE — Telephone Encounter (Signed)
Pt returned call. Per mindy I advised pt that her rx's had been called in. Nothing further needed per pt. Tivis Ringer

## 2010-10-13 MED ORDER — METOPROLOL SUCCINATE ER 50 MG PO TB24: 50.0000 mg | ORAL_TABLET | Freq: Every day | ORAL | Status: DC

## 2010-10-13 MED ORDER — RALOXIFENE HCL 60 MG PO TABS: 60.0000 mg | ORAL_TABLET | Freq: Every day | ORAL | Status: DC

## 2010-10-13 MED ORDER — DICLOFENAC SODIUM 1 % TD GEL: TRANSDERMAL | Status: DC

## 2010-10-13 MED ORDER — IMIPRAMINE HCL 25 MG PO TABS: 25.0000 mg | ORAL_TABLET | Freq: Two times a day (BID) | ORAL | Status: DC

## 2010-10-13 MED ORDER — SUCRALFATE 1 GM/10ML PO SUSP: ORAL | Status: DC

## 2010-10-13 NOTE — Telephone Encounter (Signed)
Addended by: Wardell Honour on: 10/13/2010 09:25 AM   Modules accepted: Orders

## 2010-10-13 NOTE — Telephone Encounter (Signed)
rx for these meds did not go through to [facility]---reordered meds and meds were resent for pt

## 2010-10-25 ENCOUNTER — Telehealth: Payer: Self-pay | Admitting: Pulmonary Disease

## 2010-10-25 NOTE — Telephone Encounter (Signed)
Pt states she only received 1 1/2 bottle of carafate. [facility]oke [facility]/ [facility] and he states they faxed over a medication request for pt's carafate and it had originally 1800 ml's on it but when faxed back it was crossed back w/ 600 ml's. Sharma Covert states they can't change order and pt will have to call and request refill when she is almost out. I advised pt of this and she states she will

## 2010-11-01 ENCOUNTER — Encounter: Payer: Self-pay | Admitting: Pulmonary Disease

## 2010-11-02 ENCOUNTER — Ambulatory Visit (INDEPENDENT_AMBULATORY_CARE_PROVIDER_SITE_OTHER): Payer: Medicare Other | Admitting: Pulmonary Disease

## 2010-11-02 ENCOUNTER — Encounter: Payer: Self-pay | Admitting: Pulmonary Disease

## 2010-11-02 DIAGNOSIS — I739 Peripheral vascular disease, unspecified: Secondary | ICD-10-CM

## 2010-11-02 DIAGNOSIS — K219 Gastro-esophageal reflux disease without esophagitis: Secondary | ICD-10-CM

## 2010-11-02 DIAGNOSIS — I10 Essential (primary) hypertension: Secondary | ICD-10-CM

## 2010-11-02 DIAGNOSIS — G459 Transient cerebral ischemic attack, unspecified: Secondary | ICD-10-CM

## 2010-11-02 DIAGNOSIS — J42 Unspecified chronic bronchitis: Secondary | ICD-10-CM

## 2010-11-02 DIAGNOSIS — F411 Generalized anxiety disorder: Secondary | ICD-10-CM

## 2010-11-02 DIAGNOSIS — M1990 Unspecified osteoarthritis, unspecified site: Secondary | ICD-10-CM

## 2010-11-02 DIAGNOSIS — M545 Low back pain: Secondary | ICD-10-CM

## 2010-11-02 DIAGNOSIS — M899 Disorder of bone, unspecified: Secondary | ICD-10-CM

## 2010-11-02 MED ORDER — HYDROXYZINE HCL 25 MG PO TABS: 25.0000 mg | ORAL_TABLET | Freq: Three times a day (TID) | ORAL | Status: AC | PRN

## 2010-11-02 MED ORDER — PREDNISONE 5 MG PO TABS: 5.0000 mg | ORAL_TABLET | Freq: Every day | ORAL | Status: DC

## 2010-11-02 NOTE — Patient Instructions (Signed)
Today we updated your med list...    We decided to add HYDROXYZINE Pamoate 25mg > take 1 every 6H as needed for the nervous feeling & hot sensation...    We decided to continue the Prednisone the same> 5mg - one tab daily in the AM...  Call for any questions... Let's plan a follow up visit in 3 months, sooner if the need arises.Marland KitchenMarland Kitchen

## 2010-11-02 NOTE — Progress Notes (Signed)
Subjective:    Patient ID: [name], female    DOB: [birth year], 77 y.o.   MRN: [id number]  HPI [name] here for a follow up visit... she has mult med problems as noted below...   ~  July 01, 2010:  Add-on for cough- mostly dry but feeling gets "hung" in esoph, and worse at night... Robitussin w/o help, she's distressed by this & under incr stress due to husb cancer... denies f/c/s, no discolored phlegm, etc... recall hx "supersens esoph" w/ full evals from DrPatterson, on max Rx w/ Nexium Bid & Carafate liq Qid... she's had prev Nissen in 90s, and re-do in 2006 (see below)... she has NOT been on max anti-reflux regimen & we discussed meds + elev HOB 6", no eating or drinking after dinner, PLUS rx w/ Depo/ Pred 10mg -7d taper, Symbicort, Tussionex...  ~  August 12, 2010:  states she improved w/ rx but cough "returned" 4d after she ran out of Pred, & the cough bothers her husb... we discussed getting back on Pred 10mg /d & using the Tussionex Prn (+continue all other therapies> Symbicort, Mucinex, elev HOB, not eating after dinner, Nexium, Carafate, etc)...  ~  September 15, 2010:  106mo ROV- breathing better/ improved & we discussed decr Pred from 10mg /d to 5mg /d til return... she is c/o "nervous in my chest at night" DrRamos rx w/ Lyrica, & c/o knee pain w/ shot in knee... reminded to take Rebeca Allegra regularly for nerves.  ~  Nov 02, 2010:  6wk ROV & she appears reasonably stable> on the Pred 10mg - 1/2 tab daily (keep same for now), and taking the Alpraz 0.5mg  Bid "it helps my nerves" but still c/o "nervous feeling in my chest- I get hot" & I think it would be worth a trial of HYDROXYZINE 25mg  prn for this...  Her CC is still her knee pain & this gets her focused off her other somatic complaints (still follows w/ DrAlusio & Ramos)...  No apparent resp exac; BP controlled on meds; denies ischemic cerebral or other symptoms; GI appears stable & she requests Carafate refill; etc...         Problem  List:  BRONCHITIS, RECURRENT (ICD-491.9) - prev off Symbicort & stable until 12/11 w/ refractory cough (esp at night) & reflux related symptoms >>> we discussed Depo/ PRED10mg -7d taper, SYMBICORT160-2spBid, TUSSIONEX Qhs, & antireflux  regimen ==> improved but symptoms ret off the Pred therefore restarted Pred 10mg /d 2/12 w/ slower taper... ~  CXR 3/11 showed sternal fx, old right clavic fx, clear lungs, prev right breast surg. ~  CXR 12/11 showed sternal deformity, & few chr changes, s/p right breast ca surg clips, borderline Cor & Ao calcif, NAD.  HYPERTENSION (ICD-401.9) - on TOPROL XL 50mg /d, VERAPAMIL 180mg /d, & IMDUR 30mg /d... not on a diuretic due to ASH. ~  2DEcho 7/08 showed LVH and asymmetric septal hypertrophy- on Toprol & Verapamil. ~  3/12:  BP = 150/90 today & denies HA, visual changes, CP, palipit, dizziness, syncope, dyspnea, etc... she notes some fatigue and edema... ~  5/12:  BP= 132/70 & she is unchanged...  CONGESTIVE HEART FAILURE (ICD-428.0) - pt was felt to have diast dysfuction by cardiology in 2007. ~  hx atypic CP w/ neg Myoview 5/07 showing no ischemia or infarction & EF=80%. ~  holter monitor w/ PVC's only (eval for palpit and dizzy)  PERIPHERAL VASCULAR DISEASE (ICD-443.9) -  on PLAVIX 75mg /d, she stopped ASA on her own... Hx of ASPVD with previous TIA-  evaluation revealed CT brain with moderate atrophy and small-vessel disease; MRI confirms; MRA showed moderate branch vessel arteriosclerotic changes and MRA of the neck showed several stenoses including 30% stenosis proximal right  innominate artery, 70% stenosis proximal left CCA, 45% prox left subclav, 40% right subclav, & ?12% prox left ICA stenosis. ** some of this was felt to be artifactual by DrCooper on his eval 8/08- CDoppler w/ 0-39% bilat ICAs.   VENOUS INSUFFICIENCY (ICD-459.81) - she's had some edema in the past... control w/ low sodium, elevation, and support hose... avoid diuretics w/ ASH.  GERD  (ICD-530.81) - on NEXIUM 40mg Bid, & CARAFATE 1gm/61ml- taking 1tspQid... ~  hx of "supersensitive esophagus" per DrPatterson w/ hx Nissen Fundoplication in the 90's and subseq re-do Lap Nissen w/ gore-tex patch in 2006 by DrMMartin. ~  prev EGD 7/07 showed HH surg x2, Barrett's esoph, gastritis w/ neg HPylori... ~  extensive eval in Jun09 by DrPatterson w/ recurrent reflux symptoms, CP & dysphagia... EGD w/ severe esophagitis/ gastritis- HPylori neg and Bx neg for eosinophilic esoph... Barium esophogram showed recurrent hernia w/ marked GE reflux... on max acid suppression and referred to DrMMartin for further surgery... he sent her back to DrPatterson w/ another EGD 10/09 showing intact Nissen, ?Barrett's & gastritis... she remains on max meds w/ Nexium/ Carafate. ~  12/11:  Add-on for noct cough & rec for max antireflux regimen> elev HOB 6" blocks, NPO after dinner, etc...  IRRITABLE BOWEL SYNDROME (ICD-564.1) - ? bact overgrowth in the past Rx'd w/ Xifaxan & Align... also taking LEVSIN Prn and IMIPRAMINE Bid... ~  last colonoscopy 3/08 by DrPatterson showed divertics only...  Hx of UTI (ICD-599.0)  Hx of ADENOCARCINOMA, BREAST (ICD-174.9) - see prev notes from surg & oncology.  HYPOTHYROIDISM (ICD-244.9) - on SYNTHROID 118mcg/d... ~  labs 3/10 on Synth100 showed TSH= 2.37  DEGENERATIVE JOINT DISEASE (ICD-715.90) - s/p left TKR 1/09 by DrBlackmon... she takes [facility] 10/325 per DrRamos...  also uses VOLTAREN GEL Prn... ~  c/o considerable left knee pain since surg> eval by Susann Givens (he did synovectomy 6/10), DrRamos (he feels poss neuropathic pain & tried nerve block Rx w/ IMIPRAMINE 25mg Bid), and 2nd opinion DrHowe at [facility]... ~  she saw DrRamos & Alusio> neuropathic pain w/ trial LYRICA 75mg  1-2 daily; Bone Scan was neg w/o knee uptake. ~  persistant discomfort left knee despite everything & she says "DrNitka knows there is something bad wrong in there but he can't find it"  LOW BACK PAIN,  CHRONIC (ICD-724.2) - she has had 2 prev back surgeries and prev CSpine surgery as well...s/p epid steroid shots by DrRamos...  OSTEOPENIA (ICD-733.90) - on EVISTA 60mg /d, + calcium, vits... ~  labs 3/10 showed Vit D level = 21... rec> Vit D OTC 2000 u daily. ~  labs 3/11 showed Vit D level = 15... rec> take Vit D 2000 u daily. ~  9/11: pt indicates that she is still not taking OTC Vit D supplement- rec to switch to 50,000 u weekly Rx.  TRANSIENT ISCHEMIC ATTACK (ICD-435.9) - on PLAVIX 75mg /d & she stopped ASA on her own... ~  CTBrain 7/08 showed mod atrophy and sm vessel dis...  ~  MRI Brain w/ similar findings & MRA showed mod branch vessel ateriosclerotic changes... ~  MRA neck showed mult stenoses- 30% prox R innominate, 70% prox LICA, 45% prox L subclav, etc.     This was felt to be artifactual? and subseq eval by DrCooper 8/08 showed CDoppler w/ min plaque only,  0-39% bilat.  ANXIETY (ICD-300.00) - she has mult somatic complaints> takes ALPRAZOLAM 0.5mg - 1/2 to 1 Tid as needed.   Past Surgical History  Procedure Date  . Total knee arthroplasty 1 2009    left, Dr. Magnus Ivan  . Nissen fundoplication     and re-do nissen with Gore-Tex ptch 2006 Dr. Daphine Deutscher  . Appendectomy   . Abdominal hysterectomy   . Mastectomy     Outpatient Encounter Prescriptions as of 11/02/2010  Medication Sig Dispense Refill  . ALPRAZolam (XANAX) 0.5 MG tablet Take 1/2 to 1 tablet by mouth three times a day as needed for nerves       . budesonide-formoterol (SYMBICORT) 160-4.5 MCG/ACT inhaler Inhale 2 puffs into the lungs 2 (two) times daily.        . chlorpheniramine-HYDROcodone (TUSSIONEX PENNKINETIC ER) 10-8 MG/5ML LQCR Take 5 mLs by mouth every 12 (twelve) hours as needed.        . Cholecalciferol (VITAMIN D3) 50000 UNITS CAPS Take 1 capsule by mouth once a week.        . clopidogrel (PLAVIX) 75 MG tablet Take 75 mg by mouth daily.        . diclofenac sodium (VOLTAREN) 1 % GEL Use as directed  3 Tube  3    . HYDROcodone-acetaminophen (VICODIN ES) 7.5-750 MG per tablet Take 1 tablet by mouth 2 (two) times daily as needed.        . hyoscyamine (ANASPAZ) 0.125 MG TBDP Place 0.125 mg under the tongue every 4 (four) hours as needed.        Marland Kitchen imipramine (TOFRANIL) 25 MG tablet Take 1 tablet (25 mg total) by mouth 2 (two) times daily.  180 tablet  3  . isosorbide mononitrate (IMDUR) 30 MG 24 hr tablet Take 30 mg by mouth daily.        Marland Kitchen levothyroxine (SYNTHROID, LEVOTHROID) 100 MCG tablet Take 100 mcg by mouth daily.        . metoprolol (TOPROL XL) 50 MG 24 hr tablet Take 1 tablet (50 mg total) by mouth daily.  90 tablet  3  . predniSONE (DELTASONE) 5 MG tablet Take 1 tablet (5 mg total) by mouth daily.  30 tablet  5  . raloxifene (EVISTA) 60 MG tablet Take 1 tablet (60 mg total) by mouth daily.  90 tablet  3  . sucralfate (CARAFATE) 1 GM/10ML suspension Take 1 tsp by mouth 4 times per day as directed  1800 mL  3  . verapamil (COVERA HS) 180 MG (CO) 24 hr tablet Take 180 mg by mouth at bedtime.        . hydrOXYzine (ATARAX) 25 MG tablet Take 1 tablet (25 mg total) by mouth 3 (three) times daily as needed for itching.  50 tablet  5  . DISCONTD: pregabalin (LYRICA) 75 MG capsule Take 75 mg by mouth 2 (two) times daily.          Allergies  Allergen Reactions  . Codeine     REACTION: itching  . Pregabalin     REACTION: causes nervousness    Review of Systems        See HPI - all other systems neg except as noted... The patient complains of dyspnea on exertion and muscle weakness.  The patient denies anorexia, fever, weight loss, weight gain, vision loss, decreased hearing, hoarseness, chest pain, syncope, peripheral edema, prolonged cough, headaches, hemoptysis, abdominal pain, melena, hematochezia, severe indigestion/heartburn, hematuria, incontinence, suspicious skin lesions, transient blindness, difficulty walking, depression,  unusual weight change, abnormal bleeding, enlarged lymph nodes, and  angioedema.     Objective:   Physical Exam     WD, WN, [name] in NAD... GENERAL:  Alert & oriented; pleasant & cooperative... HEENT:  [place]/AT, EOM-wnl, PERRLA, EACs-clear, TMs-wnl, NOSE-clear, THROAT-clear & wnl. NECK:  Supple w/ fairROM; no JVD; normal carotid impulses w/o bruits; no thyromegaly or nodules palpated; no lymphadenopathy. CHEST:  Chest is essent clear now> no rales rhonchi or signs of consolidation... HEART:  Regular Rhythm;  gr 1/6 SEM without rubs or gallops detected... ABDOMEN:  Soft & min epig tender; normal bowel sounds; no organomegaly or masses palpated... EXT: without deformities, mild arthritic changes; no varicose veins/ +venous insuffic/ tr edema. some pain w/ ROM left knee> not red, hot, swollen, etc... NEURO:  CN's intact;  no focal neuro deficits... DERM:  No lesions noted; no rash etc...   Assessment & Plan:   Bronchitis>  Refractory cough etc is improved on regimen> continue Pred 5mg /d for now, Symbicort, Tussionex prn, & anti reflux regimen...  HBP>  Controlled on meds, continue same...  ASPVD, Hx TIA>  On Plavix w/o ischemic symptoms, continue same...  GERD>  She needs to be diligent about the antireflux regimen... F/u w/ DrPatterson prn.  DJD>  On going Ortho issues are her CC & attended by DrAlusio, DrRamos, Antony Haste..Marland Kitchen

## 2010-11-14 ENCOUNTER — Encounter: Payer: Self-pay | Admitting: Pulmonary Disease

## 2010-11-16 NOTE — Progress Notes (Signed)
[facility]                        PERIPHERAL VASCULAR OFFICE NOTE   NAME:Morais, ELLENORE ROSCOE                      MRN:          [id number]  DATE:02/02/2007                            DOB:          Nov 14, 1924    Thedora Hinders returned for hospital followup at the [facility] peripheral  vascular office on February 02, 2007.  She is a delightful, 77 year old  woman, who was recently hospitalized from July 2 through July 9 at [facility] with asthmatic bronchitis and syncope.  It was noted that she had a  past history of TIA, remotely.  She underwent an MRA in the hospital  that was suggestive of carotid arterial disease.  Her MRA of the neck  revealed 30% stenosis of the right innominate, 70% stenosis of the  proximal left common carotid and minor internal carotid stenoses.  I was  asked to see her in consultation for peripheral arterial disease.  There  was mention on the MRA report that some of the findings may be  artifactual and I elected to follow her up with a carotid ultrasound.  Her carotid ultrasound was completed on July 30 and it demonstrated  normal carotid velocities, as well as normal antegrade flow in her  vertebral arteries.  There was very minor plaque seen in the carotid  bulbs bilaterally, as well as equal systolic pressures in the brachial  arteries with normal triphasic wave forms.   From a symptomatic standpoint, Ms. Leeb has done well.  She complains  of lack of energy and fatigue, but otherwise has no specific complaints.  She has had no further syncope.  She has not had any transient  neurologic symptoms.   CURRENT MEDICATIONS INCLUDE:  1. Verapamil 120 mg daily.  2. Toprol XL 50 mg daily.  3. Plavix 75 mg daily.  4. Nexium 40 mg twice daily.  5. Carafate 1 g four times daily.  6. Evista 60 mg daily.  7. Synthroid 100 micrograms daily.  8. Imipramine 25 mg twice daily.  9. Klonopin 0.5 mg daily.  10.Symbicort 80/4.5 mg twice  daily.  11.Caltrate twice daily.  12.Imdur 30 mg daily.   ALLERGIES:  CODEINE.   PHYSICAL EXAM:  Patient is alert and oriented.  She is in no acute  distress.  There are very soft bilateral carotid bruits.  Jugular venous pressure  is normal.  HEART:  Regular rate and rhythm with a 2/6 midsystolic murmur along the  left sternal border.  LUNGS:  Clear to auscultation bilaterally.  ABDOMEN:  Soft, nontender, no organomegaly, no bruits.  EXTREMITIES:  No clubbing, cyanosis or edema.  Peripheral pulses are 2+  and equal throughout.   ASSESSMENT:  Ms. Duvall is a 77 year old woman with mild bilateral  carotid arterial disease.  She has 0-39% stenoses with normal carotid  velocities.  The stenoses identified on MRA were likely artifactual, but  would recommend a yearly carotid ultrasound followup to prove stability  and, if there is no major change, then she should not require any  further serial followup.  It is reasonable to treat Ms. Weseman  aggressively regarding  atherosclerotic risk factors.  I will plan on  seeing her back on a yearly basis.     Veverly Fells. Excell Seltzer, MD  Electronically Signed    MDC/MedQ  DD: 02/15/2007  DT: 02/16/2007  Job #: [id number]

## 2010-11-16 NOTE — Consult Note (Signed)
[name], FEDORKO               ACCOUNT NO.:  [id number]   MEDICAL RECORD NO.:  [id number]          PATIENT TYPE:  INP   LOCATION:  3712                         FACILITY:  [facility]   PHYSICIAN:  Veverly Fells. Excell Seltzer, MD  DATE OF BIRTH:  1924/11/16   DATE OF CONSULTATION:  01/09/2007  DATE OF DISCHARGE:                                 CONSULTATION   REQUESTING PHYSICIAN:  Dr. Alroy Dust.   REASON FOR CONSULTATION:  Carotid arterial disease.   HISTORY OF PRESENT ILLNESS:  [name] is a 77 year old woman with  long-standing hypertension and a remote stroke (approximately 20 years  ago), who presented on January 05, 2007 after a syncopal episode.  She had  an episode of syncope that occurred after she had chest pain.  She has  long-standing esophageal problems, and had her typical pain related to  that.  Following the episode of pain, she went into the bathroom and as  her husband was helping her up from the commode, she collapsed to the  ground and apparently was unconscious for an uncertain amount of time.  There was some confusion following the episode, and she ultimately has  recovered without any further symptoms.  She feels well at the time of  my interview.  She has not had other syncopal episodes.  She has had no  further chest pain, dyspnea, orthopnea, P and D, edema, or other  complaints.  She had no focal neurologic signs at the time of the  episode.  She had no seizure activity reported   She has undergone an extensive neurologic evaluation, and has also had  some cardiac evaluation at this point.  Her workup has been remarkable  for the following:  1. An MRA of the head and neck.  Demonstrated moderate stenosis of the      proximal left common carotid artery, which may be artifactual.  She      had a mild stenosis described in the right innominate, left      subclavian artery, right subclavian artery, and left internal      carotid artery.  There is no hemodynamically  significant stenosis      in the right carotid artery.  The vertebral arteries were clear      other than a kink and narrowing in the proximal left vertebral.      The vertebral arteries were co-dominant.  The brain MRI      demonstrated some atrophy, but no acute infarct and non enhancing      lesions.  There were non-specific white matter changes related to      small vessel disease.  2. An echocardiogram was also performed, which demonstrated left      ventricular hypertrophy with an LV EF of 60%.  There was asymmetric      septal hypertrophy with a small gradient across the left      ventricular outflow tract.  Aortic valve thickness was also mildly      increased and mildly calcified with very mild aortic stenosis      noted.  There was mild  mitral regurgitation and mild to moderate      left atrial dilatation.  3. An EEG was performed which was normal.   HOME MEDICATIONS:  1. Alprazolam 0.5 mg three times daily.  2. BuSpar 30 mg daily.  3. Plavix 75 mg daily.  4. Toprol XL 100 mg daily.  5. Protonix 80 mg twice daily.  6. Evista 60 mg daily.  7. Carafate at bedtime.  8. Verapamil 180 mg daily.  9. Tussionex for cough.   ALLERGIES:  CODEINE CAUSES ITCHING.   PAST MEDICAL HISTORY:  Pertinent for the following:  1. Normal coronary arteries at cath in 1984.  2. Normal adenosine Cardiolite in 2005.  3. Hypertensive cardiomyopathy as detailed.  4. Chronic diastolic heart failure.  5. Remote CVA with no significant residual.  6. Gastroesophageal reflux with reflux esophagitis.  7. Hiatal hernia repair in 2006.  8. Chronic irritable bowel syndrome.  9. Inguinal hernia repair.  10.Hysterectomy.  11.Appendectomy.  12.Mastectomy.  13.Degenerative joint disease.   SOCIAL HISTORY:  The patient live in [place] with her husband.  She  has no history of tobacco or alcohol.  She maintains a very active  lifestyle.   FAMILY HISTORY:  [name] mother died at age 54 of a  stroke.  Her  father died at age 93 and had COPD and history of myocardial infarction.   REVIEW OF SYSTEMS:  A complete 12-point review of systems was performed.  Pertinent positives included vision loss with corrective lenses, urinary  urgency, anxiety, joint swelling, gastroesophageal reflux disease with  severe esophageal problems, and thyroid disease.  All other systems were  reviewed and are negative except as detailed.   PHYSICAL EXAMINATION:  GENERAL:  The patient is alert and oriented.  She  is in no acute distress.  She is a very bright elderly woman.  VITAL SIGNS:  Her temperature is 98.2, heart rate 83, respiratory rate  20, blood pressure is 117/71, oxygen saturation is 100% on room air.  HEENT:  Normal.  NECK:  Normal carotid upstrokes with soft bilateral bruits.  Jugular  venous pressure is normal.  There is no thyromegaly or thyroid nodules.  There is no lymphadenopathy.  CARDIOVASCULAR:  The apex is discrete and nondisplaced.  The heart is  regular rate and rhythm.  There is a 2/6 mid systolic murmur along the  left sternal border.  There are no gallops.  There are no diastolic  murmurs.  LUNGS:  Clear to auscultation bilaterally.  ABDOMEN:  Soft, nontender.  No organomegaly.  No abdominal bruits.  No  masses.  EXTREMITIES:  No clubbing, cyanosis, or edema.  There is some minor  varicosities in the legs.  Pulses are 2+ and equal throughout.  SKIN:  Warm and dry without rashes.  MUSCULOSKELETAL:  There are no joint deformities.  BACK:  There is no CVA tenderness.  NEUROLOGIC:  Alert and oriented all spheres.  Cranial nerves II-XII are  grossly intact.  Strength is 5/5 and equal in the arms and legs  bilaterally.   A chest x-ray shows improving bibasilar aeration and no acute changes.   LABORATORY DATA:  Shows a creatinine of 0.7.  Hemoglobin of 16.4.  Negative cardiac biomarkers.  INR of 1.  TSH 9.49.  BNP of 88.  D-dimer  0.93.   MRA findings as detailed  above.   ASSESSMENT/PLAN:  This is a 77 year old woman with suspected unilateral  carotid arterial disease.  It is possible that this is a real  finding,  although the issue of artifact was raised, and this is a somewhat  atypical area for carotid stenosis, although it is not unheard of.  I  would recommend an outpatient carotid duplex scan to better define the  patient's carotid arterial disease.  In any event, I suspect an  incidental finding as unilateral carotid stenosis would not be an  etiology of syncope.  With mild plaque described in the grade vessels,  it would be reasonable to treat Mrs. Creary aggressively as she has a  coronary risk equivalent, and she should have aggressive treatment of  her hypertension.  Also I would recommend starting a Statin.  She is  intolerant to aspirin and she should be continued on Plavix with her  remote history of stroke and plaque disease as detailed above.  Clinically, she has no signs or symptoms of vertebral basilar  insufficiency, and I suspect the narrowing described in the left  vertebral artery is also an incidental finding, and artifact is a  possibility as well.  We can assess her vertebral flow by duplex  ultrasound as well to make sure that she does not have any evidence of  subclavian steal.  Again, there is no clinical history of this.   In summary, I will make arrangements for a carotid duplex in the office  and will follow up with the patient following her duplex scan with  serial carotid ultrasounds as indicated by the degree of stenosis seen  on her initial study.  Otherwise, would recommend medical therapy for  her vascular disease as detailed above with Plavix and the addition of a  Statin to her medical regimen.   Regarding her syncope, it is possible that her asymmetric hypertrophy  seen on echocardiography is playing a role.  She could have dynamic  outflow obstruction, and could be susceptible to reduced cardiac  output  at times of decreased preload.  I talked to her about maintaining  adequate hydration with caution to avoid dehydration.   Thank you for the opportunity to see Mrs. Rayon.  Please feel free to  call at any time with questions regarding her care.      Veverly Fells. Excell Seltzer, MD  Electronically Signed     MDC/MEDQ  D:  01/09/2007  T:  01/10/2007  Job:  [id number]   cc:   Doylene Canning. Ladona Ridgel, MD

## 2010-11-16 NOTE — Op Note (Signed)
NAMELEO, WEYANDT               ACCOUNT NO.:  [id number]   MEDICAL RECORD NO.:  [id number]          PATIENT TYPE:  OIB   LOCATION:  5036                         FACILITY:  [facility]   PHYSICIAN:  Kerrin Champagne, M.D.   DATE OF BIRTH:  [birth year]   DATE OF PROCEDURE:  12/05/2008  DATE OF DISCHARGE:                               OPERATIVE REPORT   PREOPERATIVE DIAGNOSES:  1. Left knee pain following total knee arthroplasty, anteroinferior      knee pain, likely arthrofibrosis.  No radiographic signs of      loosening.  2. Arthrofibrosis, left anterior knee with fibrous scar found within      the distal intercondylar notch of the femur component also with the      anterior aspect of the tibial insert at the rotating platform      interval.   POSTOPERATIVE DIAGNOSES:  1. Left knee pain following total knee arthroplasty anteroinferior      deep vein likely arthrofibrosis.  No radiographic signs of      loosening.  2. Arthrofibrosis, left anterior knee with fibrous scar found within      the distal intercondylar notch of the femur component also over the      anterior aspect of the tibial insert at the rotating platform      interval.   PROCEDURE:  Left knee arthroscopy with anterior synovectomy.   SURGEON:  Kerrin Champagne, MD   ASSISTANT:  None.   ANESTHESIA:  General via orotracheal intubation, Dr. Laverle Hobby,  supplemented with local infiltration with Marcaine 0.5% with 1:200,000  epinephrine, 20 mL.   FINDINGS:  Arthrofibrosis into the intercondylar notch of the distal  femur and over bilateral anterior femoral condyles.   ESTIMATED BLOOD LOSS:  30 mL.   COMPLICATIONS:  None.   TOTAL TOURNIQUET TIME:  At 300 mmHg, 80 minutes.   The patient returned to the PACU in good condition.   HISTORY OF PRESENT ILLNESS:  The patient is an 77 year old female who  has undergone left total knee arthroplasty in January 2009.  She had  uneventful surgical procedure and postoperative  hospital course,  discharged with therapy.  Following therapy, she developed excellent  range of motion, after period of 3-4 weeks, however, she began  experiencing increasing pain and discomfort over the anteroinferior  aspect of the left knee.  She has subsequently undergone evaluations  including bone scans, serial x-ray evaluation, and even consultation by  myself and Dr. Providence Lanius of [place].  After undergoing consultation,  it was felt that the likely cause is arthrofibrosis, and diagnostic and  operative arthroscopy may potentially be of some benefit.   INTRAOPERATIVE FINDINGS:  The patient was found to have arthrofibrosis  with scar tissue entering into the intercondylar notch and over the ends  of the condyles limiting the patient's movement without a great deal of  tethering of the anteroinferior aspect in this patient's anterior knee  joint.   DESCRIPTION OF PROCEDURE:  After adequate general anesthesia, the  patient had left knee marked in the preop holding area.  Standard  preoperative antibiotics  of Ancef.  Following general anesthesia, supine  position, all pressure points were well padded under the sides.  The  right leg in the knee holder, well padded, the left leg with end of the  table dropped off within the left leg knee holder.  Tourniquet about the  left upper thigh.  Standard prep with DuraPrep solution, left foot all  the way encompassing the calf and the thigh, draped in the usual manner.  The leg was placed into a half drape and was manipulated while using a  rolling chair or stool.   Infiltration carried out in the anteroinferior lateral portal site and  identification of joint line using the needle, Marcaine with 5 mL.  Stab  incision with 11-blade scalpel made the anteroinferior lateral portal  site and the sleeve for the arthroscope was then inserted and the knee  inflated using 80 mmHg pressure for irrigant solution.  Examination  demonstrated  abundant scar tissue within the notch.  A stab incision was  then made over the anteroinferior medial aspect of the left knee using  an 11-blade scalpel after infiltration of Marcaine 0.5% with 1:200,000  epinephrine.  A trocar was then introduced into the knee and then a 4.5  full-radius shaver inserted.  Full-radius shaver was used to shave the  scar tissue over the anterior aspect of the knee towards the scope until  scope was visible and the knee joint visible.  Then, the scar tissue was  then sequentially debrided across the anterior aspect of the  anteroinferior aspect removing scar over the ends of the condyles using  full-radius shaver in shaving motion.  All attempts were made to keep  the cutting portion of the shaver away from the metallic surfaces as  well as the polypropylene surfaces.  Abundant scar tissue was found  within the intercondylar notch as well as over the distal ends of the  femur with the knee flexed.  These were all shaved back until the lower  50% of the anterior aspect of the knee then debrided of its scarred  synovial layers.  Some scar tissue was noted that extend through the  intercondylar notch into the joint line especially laterally and these  were debrided back until completely free.  Following debridement, then  the anterior edge of the poly insert was also debrided the scar tissue  down to the anterior surface of the poly tibial tray where articulation  was present here.  Once this was completed, then irrigation was carried  out of the knee joint, felt that the knee was elevated and exsanguinated  with Esmarch bandage prior to beginning of the surgical case and also  standard preoperative and intraoperative time-out was carried out  identifying the patient, site of the surgery and expected surgical  procedure, length and time, and any concerns regarding complications.  With this, then the knee was irrigated with copious amounts of irrigant  solution.   Permanent images were obtained prior to debridement as well  as during debridement and after debridement of the knee for  documentation purposes.  With this, then irrigation was completed.  The  sleeves for the scope and the instrumentation were then removed.  Each  of the stab incisions were closed with interrupted sutures of 3-0  Vicryl.  Then, the skin closed with interrupted vertical mattress  sutures of 4-0 nylon.  Adaptic, 4x4s fixed to the skin with sterile  Kerlix.  This was done after infiltration of the knee joint in each  of  the stab incisions again with Marcaine 0.5% with 1:200,000 epinephrine,  then an ACE wrap was applied from the left ankle to the left upper  thigh.  The patient was then reactivated, extubated, returned to the  recovery room in satisfactory condition.  All instruments and sponge  counts were correct.      Kerrin Champagne, M.D.  Electronically Signed     JEN/MEDQ  D:  12/05/2008  T:  12/06/2008  Job:  [id number]

## 2010-11-16 NOTE — Procedures (Signed)
EEG NUMBER:  [id number]   CLINICAL HISTORY:  The patient is a 77 year old with episodes of syncope  (Code 780.2).   PROCEDURE:  The tracing was carried out of 32-channel digital Cadwell  recorder reformatted into 16-channel montages with one devoted to EKG.  The patient was awake and drowsy.   MEDICATIONS INCLUDE:  Protonix, Plavix, Evista, Synthroid, Toprol,  BuSpar and Xanax.   DESCRIPTION OF FINDINGS:  The dominant frequency is at 8-10 Hz 30  microvolt activity.  Superimposed upon this is a frontally predominant  under 10 microvolt beta range activity, and centrally predominant 20-30  microvolt theta range activity.   The patient drifts into drowsiness which was predominately theta range  background.  Light natural sleep was not achieved.   Photic stimulation was carried out and failed to induce a driving  response.  Hyperventilation was not carried out.   EKG showed a regular sinus rhythm with ventricular response of 60 beats  per minute.   IMPRESSION:  Normal record with the patient awake and drowsy.      Deanna Artis. Sharene Skeans, M.D.  Electronically Signed     ZDG:UYQI  D:  01/04/2007 18:52:14  T:  01/05/2007 10:52:47  Job #:  [id number]   cc:   Lonzo Cloud. Kriste Basque, MD  [address]  [place]  [place] [address]

## 2010-11-16 NOTE — Assessment & Plan Note (Signed)
[facility]                             PULMONARY OFFICE NOTE   NAME:[name], [name]                      MRN:          [id number]  DATE:02/07/2007                            DOB:          [birth year]    HISTORY OF PRESENT ILLNESS:  The patient is a 77 year old white female  patient of Dr.  Jodelle Green  who has a known history of asthmatic  bronchitis, hypertension, congestive heart failure, and gastroesophageal  reflux disease who presents today for a two week followup. The patient  recently has been hospitalized July 2 through July 9 for a refractory  asthmatic bronchitic episode and with associated syncopal episodes as  well. The patient reports that she has been doing well since her  hospitalization and is slowly improving. Cough is almost totally  resolved. The patient had multiple changes in her medications during her  recent hospitalization and has brought all of her medications in today  for medication assistance. [name] medications are correct with  our medication list.   PAST MEDICAL HISTORY:  Reviewed.   CURRENT MEDICATIONS:  Reviewed.   PHYSICAL EXAMINATION:  The patient is pleasant female in no acute  distress. She is afebrile with stable vital signs. O2 saturation is 93%  on room air.  HEENT: Unremarkable.  NECK: Supple without cervical adenopathy. No JVD.  LUNGS: Lung sounds are diminished in the bases, otherwise, clear.  CARDIAC: Regular rate.  ABDOMEN: Soft and nontender.  EXTREMITIES: Warm without calf cyanosis or clubbing. There is trace  edema.   IMPRESSION/PLAN:  1. Recent slow to resolve asthmatic bronchitic exacerbation, now      improved status post hospitalization with treatment of antibiotic      and systemic steroids and nebulized bronchodilators.  2. Recent syncopal episode of uncertain etiology. The patient had      extensive neurological examination during her hospitalization, with      no source being  identified and also cardiac revealed only transient      bradycardia with adjustment of her Toprol and her verapamil. The      patient has had no further syncopal episodes and will continue on      her Plavix therapy.  3. Complex medication regimen. [name] medications were reviewed      in detail. The patient education was provided. A computerized      medication calendar was completed for      this patient and reviewed in detail. The patient will followup with      Dr. Kriste Basque as scheduled or sooner if needed.      Rubye Oaks, NP  Electronically Signed      Lonzo Cloud. Kriste Basque, MD  Electronically Signed   TP/MedQ  DD: 02/07/2007  DT: 02/07/2007  Job #: [id number]

## 2010-11-16 NOTE — Assessment & Plan Note (Signed)
[facility]                         GASTROENTEROLOGY OFFICE NOTE   NAME:STANLEYParthenia, Tellefsen                      MRN:          [id number]  DATE:12/04/2006                            DOB:          10-23-1924    PROGRESS NOTE:  Ms. Rummell again presents with atypical chest pain  despite repair of her hiatal hernia in October 2006 with Mesh  implantation by Dr. Daphine Deutscher. Since that time, she has had repeat  endoscopy a year ago, which was unremarkable. She had chest pain after  endoscopy and had a negative barium swallow examination done.   She continues to complain of recurrent pain in her subxyphoid area,  radiating to her back, which lasts approximately 5 minutes in duration.  Has no real precipitating or alleviating elements. Cannot tolerate  nitroglycerin as prescribed by Dr. Kriste Basque. She is on maximal acid  suppressive therapy with Nexium 40 mg twice a day and Carafate liquid  after meals and at bedtime. She has no associated dysphagia or  hepatobiliary complaints. She has not had an ultrasound examination in  many years. She denies any cardiopulmonary symptoms whatsoever adn had a  negative recent cardiac workup and EKG. Appetite is good and her weight  is stable. She denies any lower GI problems. Last manometry in February  2005 showed no evidence of esophageal dysmotility. Her 24 hour pH probe  study done in January of this year was remarkable in that she had a  normal DeMeester score but there were 95 upright reflex episodes but  only 2 were greater than 5 minutes. There was a positive symptom index  for heartburn but not for chest pain. It was felt that she most likely  had some upright acid reflux that should respond to PPI therapy. Last  colonoscopy was in March of this year and also was unremarkable. The  patient does carry a diagnosis of chronic irritable bowel syndrome, and  diverticulosis coli.   PHYSICAL EXAMINATION:  GENERAL:  Examination  today shows her to be awake  and alert and in no acute distress.  VITAL SIGNS:  She weighs 159 pounds, which is actually down 10 pounds  from February of this year. Blood pressure 166/90 and pulse was 92 and  regular.  SKIN:  I could not appreciate stigmata of chronic liver disease.  CHEST:  Was entirely clear to auscultation and percussion.  CARDIOVASCULAR:  She was in a regular rhythm without significant murmur,  rub, or gallop.  ABDOMEN:  Examination showed some mild tenderness of her subxyphoid  process in the epigastric area but otherwise, her abdominal examination  was unremarkable without organomegaly, masses, or tenderness. Bowel  sounds were normal.  NEUROLOGIC:  Mental status was clear.   ASSESSMENT:  1. ATYPICAL CHEST PAIN OF QUESTIONABLE ETIOLOGY:  I suspect the      patient has a so called supra-sensitive esophagus with associated      esophageal spasm  2. RULE OUT  CHOLELITHIASIS.  3. CHRONIC IRRITABLE BOWEL SYNDROME.  4. HISTORY OF HYPERTENSION.  5. STATUS POST MULTIPLE SURGICAL PROCEDURES INCLUDING HIATAL HERNIA      REPAIRS,  INGUINAL HERNIA REPAIRS, HYSTERECTOMY, APPENDECTOMY, AND      MASTECTOMY.   RECOMMENDATIONS:  1. Continue strict anti-reflux regimen along with avoidance of hot and      cold liquids. I have asked her to use all liquids at room      temperature.  2. Trial of Darvocet N-100 1 before bedtime with p.r.n. sublingual      Levsin 0.125 mg, use every 6 to 8 hours.  3. Continue Nexium and Carafate prescription.  4. Check abdominal ultrasound examination.  5. Consider referral to tertiary medical center.     [name]. Jarold Motto, MD, Caleen Essex, FAGA  Electronically Signed    DRP/MedQ  DD: 12/04/2006  DT: 12/04/2006  Job #: [id number]   cc:   Lonzo Cloud. Kriste Basque, MD

## 2010-11-16 NOTE — Discharge Summary (Signed)
[name], [name]               ACCOUNT NO.:  [id number]   MEDICAL RECORD NO.:  [id number]          PATIENT TYPE:  INP   LOCATION:  5021                         FACILITY:  [facility]   PHYSICIAN:  [name], M.D.DATE OF BIRTH:  1924-11-03   DATE OF ADMISSION:  07/27/2007  DATE OF DISCHARGE:  08/02/2007                               DISCHARGE SUMMARY   ADMITTING DIAGNOSIS:  Severe osteoarthritis, left knee.   DISCHARGE DIAGNOSIS:  Severe osteoarthritis, left knee.   PROCEDURES:  Left total knee arthroplasty.   HOSPITAL COURSE:  Briefly, [name] is an 77 year old female with  severe debilitating arthritis affecting her left knee.  This has gotten  to the point where it was impacting her activities of daily living.  She  is a very spry individual who is very active.  We had tried  antiinflammatories and injections in her knee and it got to the point  where she wanted to proceed with total knee arthroplasty.  She really  did understand the risks and benefits of the surgery and wanted to  proceed with surgery __________ in an effort to increase her pain and  increase her levels of activity.   PROCEDURE DESCRIPTION:  [name] was taken to the operating room the  day of admission where a left total knee arthroplasty was performed  without complications.  Please refer to the dictated operative note for  details and description of the operation.  Postoperatively, she began  working with physical therapy and was in a CPM, or a continuous passive  motion device, working on range of motion of her knee.  She proceeded  well throughout the week with physical therapy and by the day of  discharge was tolerating her oral pain medications, as well as an oral  diet.  Her incision was found to be clean, dry, and intact.  Physical  therapy had said that she was doing well enough to be discharged safely  to home.  During hospitalization she did require a transfusion of blood  secondary to acute blood loss anemia and this responded well.  By day of  discharge again she was very comfortable and feeling quite well.   DISPOSITION:  To home.   DISCHARGE INSTRUCTIONS:  Once she is at home, she will continue to  weight bear as tolerated and work with range of motion of her knees  using the home health physical therapy services.  She will be given  Coumadin for DVT prophylaxis with adjusting for an INR with a target of  2.0 to 2.5.  She can get her incision wet in the shower and can put dry  dressings on it only as needed.  A detailed list of her medications were  provided in her medical reconciliation form but I will give her  prescriptions for Percocet for pain and Robaxin for spasms, as well as  the Coumadin.  Followup will be in the office in 2 weeks.      [name], M.D.  Electronically Signed    CYB/MEDQ  D:  08/02/2007  T:  08/02/2007  Job:  [id number]
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
## 2010-11-16 NOTE — Assessment & Plan Note (Signed)
[facility]                             PULMONARY OFFICE NOTE   NAME:[name] [name] YAGI                      MRN:          [id number]  DATE:12/26/2006                            DOB:          16-Jun-1925    HISTORY OF PRESENT ILLNESS:  The patient is a 77 year old white female  patient of Dr. Kriste Basque who has a known history of hypertension, congestive  heart failure, gastroesophageal reflux, and osteoarthritis and presents  today for persistent cough and congestion.  The patient was seen here  one week ago for an acute upper respiratory infection and given Ceclor.  The patient reports she has had no improvement in symptoms and continues  to have significant coughing and congestion.  The patient denies any  hemoptysis, chest pain, orthopnea, PND,  or leg swelling.   PAST MEDICAL HISTORY:  Reviewed.   CURRENT MEDICATIONS:  Reviewed.   PHYSICAL EXAMINATION:  The patient is a pleasant female in no acute  distress.  She is afebrile with stable vital signs.  She saturates on  97% on room air.  HEENT:  Nasal mucosa slightly red.  Nontender sinuses.  Posterior  pharynx is clear.  NECK:  Supple without cervical adenopathy.  No JVD.  LUNG SOUNDS:  Reveal coarse breath sounds bilaterally.  CARDIAC:  Regular rate.  ABDOMEN:  Soft and nontender.  EXTREMITIES:  Warm without any cyanosis, clubbing, or edema.   IMPRESSION AND PLAN:  Acute tracheobronchitis, slow to resolve.  The  patient will change over to Avelox x5 days.  Add in Mucinex DM twice  daily.  Prednisone taper over the next week.  The patient may use  Tussionex as needed for cough, #4 ounces, 1 teaspoon every 12 hours as  needed for cough control.  The patient is worse today, in fact.  The  patient is also recommended to hold fish oil at this time which could e  irritating her airway.  The patient will return back with Dr. Kriste Basque as  scheduled.      Rubye Oaks, NP  Electronically  Signed      Lonzo Cloud. Kriste Basque, MD  Electronically Signed   TP/MedQ  DD: 12/26/2006  DT: 12/26/2006  Job #: [id number]

## 2010-11-16 NOTE — Assessment & Plan Note (Signed)
[facility]                         GASTROENTEROLOGY OFFICE NOTE   NAME:[name], [name]                      MRN:          [id number]  DATE:10/30/2007                            DOB:          1925-01-19    Ms. [name] is an elderly white female who has had chronic GERD, fairly  well controlled on twice-daily PPI therapy.  She has had a long,  exhaustive GI workup which has shown evidence of irritable bowel  syndrome and diverticulosis.  She continues with abdominal gas,  bloating, occasional crampy left lower quadrant pain with some loose  stools without melena or hematochezia.  I will not detail her GI workup,  but this includes recent colonoscopy in March, 2008.  She has been  empirically treated in the past for bacterial overgrowth syndrome,  suspected segmental colitis, and a variety of other conditions without  response.   Despite all of her above complaints, she has had no anorexia, weight  loss, fever, chills, etc.   Her laboratory data has all been unremarkable, although she has had  recurrent urinary tract infections and was on Cipro 250 mg twice daily  this past fall.  She has had several gallbladder exams which have been  unremarkable.  She has had multiple surgical procedures, including  hiatal hernia repairs, inguinal hernia repairs, hysterectomy,  appendectomy, and mastectomy.   PHYSICAL EXAMINATION:  She is awake and alert in no acute distress.  She weighs 160 pounds, which is her normal weight.  Blood pressure  124/70.  Pulse 68 and regular.  ABDOMEN:  Entirely benign without organomegaly, masses, tenderness, or  distention.  Bowel sounds were normal.  Inspection of the rectum was  unremarkable, as was rectal exam, and there was soft stool that was  underlying formed, and was guaiac negative.   ASSESSMENT:  1. Symptomatic diverticulosis coli without any evidence of other more      serious underlying lower gastrointestinal  pathology.  2. Status post hiatal hernia repair with continued super-sensitive      esophagus type of problems with negative workup for esophageal      motility disorder or eosinophilic esophagitis.  She has a long      history of noncardiac chest pain.  3. Hypertensive cardiovascular disease, followed by Dr. Kriste Basque.  4. Status post surgery for adenocarcinoma of the breast.  5. Chronic thyroid dysfunction, on Synthroid.  6. Chronic degenerative osteoarthritis and osteopenia, on appropriate      therapy per Dr. Kriste Basque.  7. History of chronic anxiety syndrome, on Klonopin therapy.   RECOMMENDATIONS:  1. Continue all medications as outlined per Dr. Jodelle Green extensive      notes.  2. See no need for further GI evaluation at this time but will      continue current medications.  3. High-fiber diet with daily Benefiber and liberal po fluids.   ADDENDUM:  Patient does not need colonoscopy, which was last completed  in March, 2008.     [name]. Jarold Motto, MD, Caleen Essex, FAGA  Electronically Signed    DRP/MedQ  DD: 10/30/2007  DT: 10/30/2007  Job #:  [id number]   cc:   Lonzo Cloud. Kriste Basque, MD

## 2010-11-16 NOTE — Op Note (Signed)
[name], [name]               ACCOUNT NO.:  [id number]   MEDICAL RECORD NO.:  [id number]          PATIENT TYPE:  INP   LOCATION:  2899                         FACILITY:  [facility]   PHYSICIAN:  Vanita Panda. Magnus Ivan, M.D.DATE OF BIRTH:  [birth year]   DATE OF PROCEDURE:  07/27/2007  DATE OF DISCHARGE:                               OPERATIVE REPORT   PREOPERATIVE DIAGNOSIS:  Left knee severe degenerative joint  disease/osteoarthritis.   POSTOPERATIVE DIAGNOSIS:  Left knee severe degenerative joint  disease/osteoarthritis.   PROCEDURE:  Left total knee arthroplasty utilizing computer navigation.   IMPLANTS:  DePuy rotating platform knee with size 3 femur, size 3 tibial  tray, 12.5 polyethylene, size 32 mm patella button.   SURGEON:  Doneen Poisson, MD   ASSISTANT:  Maud Deed, PA-C   ANESTHESIA:  General.   ANTIBIOTICS:  1 gram IV Ancef.   TOURNIQUET TIME:  1 hour 26 minutes.   BLOOD LOSS:  200 mL.   COMPLICATIONS:  None.   INDICATIONS:  Briefly, [name] is an 77 year old female with known  debilitating arthritis involving her left knee.  She had tried a series  of injections and anti-inflammatories, and it got to where this was  greatly affecting her activities of daily living.  Her main pain was the  at the medial side of her knee, and it was recommended she undergo total  knee arthroplasty given this affect on her activities of daily living.  Risks and benefits of surgery were explained to her as well as the risk  of DVT and fatal PE, and she agreed to proceed with surgery.   PROCEDURE DESCRIPTION:  After informed consent was obtained, appropriate  left leg was marked.  She was brought to the operating room, placed  supine on the operating table.  General anesthesia was then obtained; a  nonsterile tourniquet was placed on her upper left thigh, and her leg  was prepped and draped with DuraPrep and sterile drapes including  sterile stockinette.  A  time-out was called to identify the correct  patient, correct extremity, and then I used an Esmarch to wrap out the  leg and was tourniquet was inflated 300 mm pressure.  With the bed  raised, a midline incision was made from the distal femur, carried down  to just medial to the tibial tubercle through the side, carried through  the soft tissues and then was able to visualize the quadriceps tendon  and patellar tendon.  I then took a medial patellar arthrotomy just off  the lateral edge of the VMO and carried this again through a medial  parapatellar arthrotomy.  Right away we could see there was bone-on-bone  wear on the medial side of the knee.  There was just mild arthritic  changes in the patella, the patellofemoral joint in the lateral side.  There was degenerative meniscal tearing as well.  We were then able to  flex the knee and cleaned the knee of debris including removed the ACL  and PCL and cleaned off any osteophytes using a rongeur.  Next, we  proceeded with the computer navigation  portion of the case.  Two  separate stab incision were made at the mid tibia and 2 Steinmann pins  were put in the anterior medial to posterior lateral direction.  Two  separate pins were also put through the main incision in the distal  femur from anterior medial posterior lateral.  Navigation globe/array  were placed on the Steinmann pins to proceed with computer navigation.  I then mapped out the leg at the ankle to the knee and then the femur.  Once the computer guide at set points for mapping the tibia and the  femur, we could proceed with the navigation portion.  We decided to take  similar cuts off the tibia with approximately 9-10 mm.  Using the  navigation, we were able to select the appropriate space for the tibia  cut and using oscillating saw, the tibia cut was made.  This was then  verified under computer navigation and verified with flexion and  extension blocks to have equal spaces.   Next, the computer helped [place]  select the femoral component, and we decided on a size 3.  Distal femur  cut was made approximately 9 mm, and then we set the rotation for slight  external rotation, again using computer navigation to verify the  alignment overall the knee.  Chamfer cuts were then made using the size  3 cutting guide.  This included the posterior cuts as well.  Again,  flexion and extension blocks were placed to verify these cuts.  Finally  the femoral box cut was made as well.  We then turned back attention to  the tibia for tibia preparation.  A size 3 platform was chosen for the  tibia tray, and this was reamed and cut as well.  A trial 3 tibial  component and a trial 3 femoral component were placed followed by a 10  mm polyethylene insert followed by 12.5 polyethylene insert.  I felt the  12.5 offered more stability and, again, under computer navigation, this  felt to be the appropriate implant as well for maintaining a normal  alignment of the knee, selected a 32 patellar button, and holes were  made for this for the patellar button.  All components were then  removed, and we copiously irrigated the knee with pulsatile lavage and  normal saline solution.  The cement was mixed, and then we cemented in  the real components of the size 3 femur, size 3 tibial plateau, and I  then placed a 12.5 real polyethylene insert followed by cementing the  patellar button.  Once these were cemented in place, put the knee  through, again, a range of motion.  It was found to have excellent and  full range of motion with no instability.  The wound was copiously  irrigated again after the tourniquet was let down, and hemostasis was  obtained.  Total tourniquet time was 1 hour and 26 minutes.  A medium  Hemovac drain was placed through a lateral portal site.  I then closed  the arthrotomy with interrupted #1 Vicryl suture followed by 0 Vicryl in  the next layer then followed by 2-0 Vicryl and  a running 4-0 Vicryl  suture in the subcutaneous closure.  Steri-Strips were then applied as  well.  The Steinmann pins were removed from the femur and the tibia  without complications.  Well-padded sterile dressing was applied, and  the patient's knee was placed in knee immobilizer.  She was awakened,  extubated and taken to  the recovery room in stable condition.  Final  blood loss was 200 mL.  There were no complications noted, and final  counts were correct.      Vanita Panda. Magnus Ivan, M.D.  Electronically Signed     CYB/MEDQ  D:  07/27/2007  T:  07/27/2007  Job:  [id number]

## 2010-11-16 NOTE — Assessment & Plan Note (Signed)
[facility]                             PULMONARY OFFICE NOTE   NAME:[place], TAMEKA HOILAND                      MRN:          [id number]  DATE:03/07/2007                            DOB:          January 13, 1925    HISTORY OF PRESENT ILLNESS:  The patient is a pleasant 77 year old white  female, a patient of Dr. Lonzo Cloud. Nadel's, who has a known history of  asthmatic bronchitis, hypertension, congestive heart failure,  hypothyroidism and osteoarthritis who presents today for an acute office  visit.  The patient complains that she has had decreased hearing and ear  stuffiness over the last several months.  She has been noticing a  progressive hearing loss.  She went for a hearing aid checkup and was  noted to have wax along her right ear.  She presents today to have it  removed.  She denies any fever, injury or drainage.   The patient complains that over the last three weeks she has had  intermittent left knee pain and swelling.  Over the last several days  the swelling has increased and is quite painful to walk.  The patient  denies any known injury.  The patient reports she has had intermittent  problems with her knees over the years.  She has not taken any  medications.  She used some ice last evening.  The patient denies any  chest pain, palpitations, calf pain or known injury.   PAST MEDICAL HISTORY:  1. Asthmatic bronchitis.  2. Arteriosclerotic peripheral vascular disease with history of      previous TIA, on Plavix.  3. Venous insufficiency with edema.  4. Gastroesophageal reflux disease with previous Nissen fundoplication      and redo laparoscopic Nissen with Gore-Tex patch in 2006, by Dr.      Molli Hazard B. Daphine Deutscher.  5. Degenerative disk disease with multi-level low back pain.  6. Breast cancer with surgery in 1992.  7. Urinary tract infections.  8. Status post hysterectomy.  9. Hypothyroidism.   CURRENT MEDICATIONS:  1. Verapamil 120 mg daily.  2. Toprol XL 50 mg daily.  3. Plavix 75 mg daily.  4. Nexium 40 mg twice daily.  5. Carafate one teaspoonful four times daily.  6. Evista 60 mg daily.  7. Synthroid 0.088 mg daily.  8. Imipramine 25 mg q.h.s.  9. Klonopin 0.5 mg daily.  10.Symbicort 80/4.5 mcg, two puffs twice daily.  11.Caltrate with vitamin D twice daily.  12.Imdur 30 mg daily.  13.Nitroglycerin p.r.n.  14.Darvocet p.r.n.   PHYSICAL EXAMINATION:  GENERAL:  The patient is a very pleasant female,  in no acute distress.  VITAL SIGNS:  She is afebrile with stable vital signs.  O2 saturation is  99% on room air.  HEENT:  Nasal mucosa is normal.  Posterior pharynx clear.  Left tympanic  membrane and EAC are clear.  Right EAC has a large cerumen impaction.  NECK:  Supple without cervical adenopathy.  No jugular venous  distention.  LUNGS:  Sounds are clear.  HEART:  A regular rate.  ABDOMEN:  Soft, nontender.  EXTREMITIES:  Warm without any cyanosis or clubbing.  There is trace  edema bilaterally.  Along the left knee the patient has some generalized  tenderness.  No significant erythema.  Some mild generalized edema is  noted.  The patient has a decreased range of motion with reproducible  symptoms.   IMPRESSION/PLAN:  1. Right cerumen impaction:  Irrigation was done with the removal of      wax.  The patient is recommended to use Debrox on an as-needed      basis.  The patient is to continue to follow up with her      audiologist, to determine if she continues to need a hearing aid      device.  2. Left knee pain and swelling, of questionable etiology:  The patient      is to begin Motrin 40 mg twice daily with food.  Keep the leg      elevated and ice as needed.  The patient has been referred to her      orthopedist, Dr. Kerrin Champagne, for further evaluation and      treatment options.      Rubye Oaks, NP  Electronically Signed      Lonzo Cloud. Kriste Basque, MD  Electronically Signed   TP/MedQ  DD:  03/07/2007  DT: 03/07/2007  Job #: [id number]   cc:   Kerrin Champagne, M.D.

## 2010-11-16 NOTE — Discharge Summary (Signed)
[name], [name]               ACCOUNT NO.:  [id number]   MEDICAL RECORD NO.:  [id number]          PATIENT TYPE:  INP   LOCATION:  3712                         FACILITY:  [facility]   PHYSICIAN:  Lonzo Cloud. Kriste Basque, MD     DATE OF BIRTH:  06/03/1925   DATE OF ADMISSION:  01/03/2007  DATE OF DISCHARGE:  01/10/2007                               DISCHARGE SUMMARY   FINAL DIAGNOSES:  1. Admitted January 03, 2007, with refractory asthmatic bronchitis and      syncopal episode that occurred approximately 1 week prior to      admission.  Asthmatic bronchitis responded to inhaled      bronchodilators and Symbicort plus Mucinex, Tussionex, etcetera.      Initial chest x-ray showed bilateral lower lobe atelectasis which      improved serially.  The patient being discharged on Symbicort,      Mucinex and Tussionex.  2. Syncopal episode of uncertain etiology.  She had extensive      neurologic testing this admission with consultation from Dr.      Orlin Hilding, et al, and no source being identified.  Cardiac monitor      revealed transient bradycardia and cardiac medications were      adjusted this admission.  The patient being continued on Plavix      therapy.  3. History of hypertension - medications adjusted this admission.  4. A 2-D echocardiogram with left ventricular hypertrophy and      asymmetric septal hypertrophy - Toprol and verapamil adjusted this      admission.  5. History of atypical chest pain with cardiac evaluation 2007, Dr.      Ladona Ridgel, showing normal Myoview without ischemia and ejection      fraction 80%, and normal left ventricular function on 2-D at that      time.  The patient felt to have diastolic dysfunction per      cardiology.  6. History of palpitations and episodic dizziness.  Previous Holter      monitor showed premature ventricular contractions only.  7. Arteriosclerotic peripheral vascular disease with history of a      previous transient ischemic attack, on Plavix.   Evaluation this      admission revealed CT brain with moderate atrophy and small-vessel      disease; similar findings on MRI brain; MRA revealing moderate      branch vessel arteriosclerotic changes and MRA of the neck      revealing several stenoses including 30% stenosis proximal right      innominate artery, 70% stenosis proximal left common carotid      artery, 45% stenosis proximal left subclavian artery, 40% right      subclavian stenosis, 12% proximal left internal carotid stenosis.  8. History of venous insufficiency and edema.  9. History of severe esophageal problems with supersensitive      esophagus per Dr. Jarold Motto.  She has had previous Nissen      fundoplication and redo laparoscopic Nissen with Gore-Tex patch in      2006 by Dr. Daphine Deutscher.  She is  on multiple medications for maximal      medical management of this problem.  10.Degenerative disk disease with multilevel low-back pain followed      Dr. Ethelene Hal.  She has had previous epidural steroids and refused      surgical referral.  Two previous back surgeries in 1993 and 2001      and a previous C-spine surgery as well.  11.History of breast cancer with surgery in 1992 and no known      recurrence.  12.History of urinary tract infections with thorough urologic      evaluation Dr. Aldean Ast, et al, on imipramine therapy.  13.Status post hysterectomy in 1976.  14.Hypothyroidism on Synthroid.   BRIEF HISTORY AND PHYSICAL:  The patient is a 77 year old white female  known to me with multiple medical issues as noted above.  She presented  to the office on January 03, 2007, with refractory asthmatic bronchitis and  a syncopal spell that occurred approximately 1 week prior to admission.  She states she had had an upper respiratory tract infection for several  weeks that was hard to shake.  She noted cough and chest congestion,  sore throat, hoarseness, and upper respiratory symptoms that progressed  to cough with yellow sputum  production and chest tightness.  She was  placed on Ceclor and Magic Mouthwash as an outpatient and subsequently  changed to Avelox and prednisone.  She stated she had no improvement on  these medications.  She denied fevers, chills and sweats.  She was very  distraught over her lack of response to the medication and admission was  felt necessary.  Her husband then mentioned a syncopal episode that  occurred approximately 1 week prior to admission.  He described sudden  syncope, stating that his wife had blacked out and fell without injury.  There was no nausea, vomiting, or incontinence.  No seizure-like  activity and no prodromal symptoms.  He states that she was out for  several hours (?) but was not amnesic when she awoke.  The husband  placed her in bed and did not call 9-1-1, etc.  They denied any chest  pain, palpitations, headache, focal weakness, slurred speech, etc.  She  does have a history of TIA and takes Plavix.  She is not on aspirin due  to her severe esophageal problems.   PAST MEDICAL HISTORY:  As noted, she has a history of hypertension with  a hypertensive cardiomyopathy and is on verapamil and Toprol-XL,  followed by Dr. Ladona Ridgel for cardiology.  She has a history of atypical  chest pain in the past with evaluation in 2007 showing a normal Myoview  without ischemia and a negative 2-D echocardiogram without left  ventricular regional wall motion changes.  She has carried the diagnosis  of diastolic dysfunction.  She has a history of palpitations and  episodic dizziness and had a Holter monitor in the past that showed only  PVCs.  She has arteriosclerotic peripheral vascular disease with known  cerebral atrophy and small-vessel disease.  She takes Plavix for this.  She is not able to take aspirin due to her esophageal problems.  She has  venous insufficiency and edema.  As noted, she has severe esophageal  problems and a supersensitive esophagus per Dr. Jarold Motto.  She  had a  previous Nissen fundoplication and a redo laparoscopic Nissen with Gore-  Tex patch in 2006 by Dr. Daphine Deutscher.  She is on numerous medications and  maximum medical management of this condition.  She has history of low-  back pain, degenerative disk disease at multiple levels, and followed  Dr. Ethelene Hal.  She has had previous epidural steroid shots.  She has  refused surgical referral recently.  She has had two previous back  surgeries and 1993 in 2001.  She has also had C-spine surgery in the  past.  She has a history of breast cancer with surgery in 1992 and  immediate reconstruction by Dr. Orpah Greek, and was seen by Dr. Britt Bolognese at  that time.  She has had no known recurrence.  She has a history of  urinary tract infections with urologic evaluation by Dr. Aldean Ast and  Dr. Wanda Plump, no other cause being found.  She is on imipramine therapy  for this.  She had a previous hysterectomy.  She has a history of  hypothyroidism and takes Synthroid.   PHYSICAL EXAMINATION:  Physical examination revealed a 81-year white  female in no acute distress.  Blood pressure 126/72, pulse 86 per minute  and regular, respirations 20 per minute and not labored, temperature 97  degrees.  HEENT exam revealed slightly dry mucous membranes, otherwise  negative.  Neck exam showed no jugular distension, no carotid bruits, no  thyromegaly or lymphadenopathy.  Chest exam was clear to percussion and  auscultation.  Cardiac exam revealed a regular rhythm, normal S1 and S2  without murmurs, rubs or gallops heard.  Abdomen was soft and nontender  without evidence organomegaly or masses.  Extremities showed no  cyanosis, clubbing or edema.  Neurologic exam was intact without focal  abnormalities detected.  Skin exam was negative.   LABORATORY DATA:  EKG showed sinus bradycardia, left axis deviation,  LVH, nonspecific ST-T wave changes.  A 2-D echocardiogram showed overall  normal left ventricular systolic function with  ejection fraction around  60%.  There were no left ventricular regional wall motion abnormalities  noted.  Left ventricular wall thickness was moderately to markedly  increased with moderate asymmetric septal hypertrophy and a small  gradient.  The aortic valve was mildly calcified, consistent with very  mild aortic stenosis, and there was mild mitral regurgitation.  Chest x-  ray showed cardiomegaly, bilateral basilar atelectasis.  Serial films  showed improvement in basilar aeration.  CT of the brain showed moderate  atrophy and small-vessel disease, arteriosclerotic changes noted in the  cavernous carotid arteries.  MRI of the brain showed atrophy without  hydrocephalus, no acute infarcts, no enhancing lesions, and nonspecific  small-vessel changes.  MRA of the brain showed moderate branch-type  atherosclerotic changes and a subsequent MRA of the neck showed multiple  stenoses including 30% stenosis in the proximal right innominate artery;  70% stenosis in the proximal left common carotid artery; 45% stenosis in  the proximal left subclavian artery; 40% stenosis in the right  subclavian artery; 12% stenosis in the proximal left internal carotid  artery.  Hemoglobin 16.4; hematocrit 49.1; white count 11,600 with  normal differential.  Sed rate 2.  Pro time 12.8, INR 1.0, PTT 27  seconds.  Sodium 136, potassium 4.2, chloride 102, CO2 26, BUN 12,  creatinine 0.93, blood sugar 163, repeat 102, calcium 9.3, total protein  5.6, albumin 3.1, AST 28, ALT 51, alk phos 52, total bilirubin 0.8.  CPK  37, negative MB and negative troponin.  BNP 49.  TSH 9.49.  Urinalysis  clear.  Urine culture grew diphtheroids.  The patient had venous  Dopplers of her legs this hospitalization revealing no evidence of DVT.  HOSPITAL COURSE:  The patient was admitted with refractory asthmatic  bronchitis and syncopal spell for evaluation.  Her asthmatic bronchitis  was treated with IV fluids, low-flow oxygen and  supportive care.  She  was given nebulizer treatments and Tussionex.  The nebulizer was  switched to Symbicort via the AeroChamber and she improved.  She will be  discharged on these medications.   The syncopal episode was evaluated both neurologically and cardiac-wise.  Neurologic evaluation and consultation by Dr. Orlin Hilding with results  above.  She had an EEG as well that was negative.  Although she has  arteriosclerotic peripheral vascular disease, there was no significant  vertebrobasilar insufficiency and no cause for her syncopal episode  identified.  Dr. Orlin Hilding recommended continuing Plavix therapy and  follow with neurology p.r.n.   From the cardiac standpoint, she had a 2-D echocardiogram that revealed  left ventricular hypertrophy and moderate asymmetric septal hypertrophy.  She did have some transient bradycardia on July 4 and her verapamil and  Toprol-XL were adjusted.  There were no long pauses and no significant  arrhythmias identified.  She was seen in consultation by Dr. Diona Browner  for peripheral vascular service.  He recommended continuing Plavix  therapy with outpatient followup with carotid Dopplers.   MEDICATIONS AT DISCHARGE:  1. Verapamil 120 mg p.o. daily.  2. Toprol-XL 50 one tablet p.o. daily.  3. Plavix 75 mg p.o. daily.  4. Nexium 40 mg p.o. b.i.d.  5. Carafate 1 g q.i.d.  6. Levsin 0.125 mg q.i.d.  7. Imdur 30 mg p.o. daily.  8. Evista 60 mg p.o. daily.  9. Synthroid 88 mcg p.o. daily.  10.Imipramine 25 mg p.o. b.i.d.  11.Klonopin 0.5 mg p.o. t.i.d.  12.Symbicort two sprays b.i.d. via AeroChamber.  13.Mucinex one to two tablets p.o. b.i.d.  14.Tussionex one teaspoon q.12h. p.r.n. cough.  15.Calcium supplement daily.  16.Multivitamin daily.  17.Fish oil supplement daily.   The patient will return to see me the office on July 16 at 2 p.m.  We  will make referral to the lipid clinic in the hope of getting her  started on a statin that she can tolerate  (she has a history of  intolerance to statins in the past).   CONDITION AT DISCHARGE:  Improved.      Lonzo Cloud. Kriste Basque, MD  Electronically Signed     SMN/MEDQ  D:  01/10/2007  T:  01/10/2007  Job:  [id number]

## 2010-11-19 NOTE — Discharge Summary (Signed)
[name], [name]                         ACCOUNT NO.:  [id number]   MEDICAL RECORD NO.:  [id number]                   PATIENT TYPE:  INP   LOCATION:  0344                                 FACILITY:  [facility]   PHYSICIAN:  Thornton Park. Daphine Deutscher, M.D.             DATE OF BIRTH:  May 15, 1925   DATE OF ADMISSION:  10/06/2003  DATE OF DISCHARGE:  10/09/2003                                 DISCHARGE SUMMARY   ADMITTING DIAGNOSIS:  Large hiatal hernia with reflux.   DISCHARGE DIAGNOSIS:  Large hiatal hernia with reflux status post  laparoscopic hiatal hernia repair and Nissan fundoplication over a #50  Bougie.   COURSE IN THE HOSPITAL:  [name] is a 77 year old lady with the above-  mentioned diagnosis who was admitted on October 06, 2003 at the time of her  surgery.  She underwent a laparoscopic repair which is depicted in the chart  showing a moderately-sized hiatal hernia with the stomach sliding up into it  which was taken down and repaired with closure of the hiatus and wrap around  the distal esophagus.  The patient had minimal complaints postoperatively.  She was started on liquids and advanced to a full liquid diet.  She checked  a chest x-ray and EKG because she had some chest soreness on April 6 and  really showed no change.  She was ready for discharge on April 7.  She was  given the dietary instructions sheets from the [facility] Surgery web  site, [email or web address], and was sent home with a prescription  for [facility] take if needed for pain.  She was asked to return to the  office in approximately 3 weeks for follow-up.   CONDITION:  Good.   FINAL DIAGNOSIS:  Status post repair of hiatal hernia.                                               Thornton Park Daphine Deutscher, M.D.    MBM/MEDQ  D:  10/09/2003  T:  10/10/2003  Job:  [id number]   cc:   [name]. Jarold Motto, M.D. [facility]   Scott M. Kriste Basque, M.D. [facility]

## 2010-11-19 NOTE — Assessment & Plan Note (Signed)
[facility]                         GASTROENTEROLOGY OFFICE NOTE   NAME:[name], [name]                      MRN:          [id number]  DATE:06/08/2006                            DOB:          1925/03/18    [name]'s acid reflux is actually doing better on Zegerid, prescribed by  Dr. Kriste Basque.  She really has no dysphagia at this time and is keeping her  weight up, and is sleeping well at night.  She does have IBS-type  complaints, has known diverticulitis.  Is not taking fiber supplements.   Her vital signs were all normal, as was her abdominal exam.   RECOMMENDATIONS:  1. Zegerid 40 mg at bedtime and before breakfast, and a third dose as      needed during the day.  I do not think we need to combine this with      Nexium or H2 blocker therapy.  2. High fiber diet with fiber supplements.  3. P.r.n. GI followup as needed.     [name]. Jarold Motto, MD, Caleen Essex, FAGA  Electronically Signed    DRP/MedQ  DD: 06/08/2006  DT: 06/08/2006  Job #: [id number]   cc:   Lonzo Cloud. Kriste Basque, MD

## 2010-11-19 NOTE — Op Note (Signed)
NAMEMARIAEDUARDA, [name]                         ACCOUNT NO.:  [id number]   MEDICAL RECORD NO.:  [id number]                   PATIENT TYPE:  INP   LOCATION:  X004                                 FACILITY:  [facility]   PHYSICIAN:  Thornton Park. Daphine Deutscher, M.D.             DATE OF BIRTH:  12-23-1924   DATE OF PROCEDURE:  10/06/2003  DATE OF DISCHARGE:                                 OPERATIVE REPORT   PREOPERATIVE DIAGNOSIS:  Hiatal hernia with gastroesophageal reflux disease.   POSTOPERATIVE DIAGNOSIS:  Moderately sized sliding hiatal hernia with  gastroesophageal reflux disease, status post laparoscopic repair of hiatal  hernia and Nissen fundoplication over a #50 lighted Bougie.   SURGEON:  Thornton Park. Daphine Deutscher, M.D.   ASSISTANT:  Adolph Pollack, M.D.   ANESTHESIA:  General endotracheal.   DESCRIPTION OF PROCEDURE:  [name] is a 77 year old lady, who has  previously undergone a right mastectomy and TRAM flap, who has had  increasing difficulty with GERD, awakening at night with heartburn and  spontaneous reflux with bending over.   She was taken to room 11, given general anesthesia.  The abdomen was prepped  with Betadine and draped sterilely.  We used a bladeless trocar in the  OptiView approach to enter the abdomen with a 0-degree 10 mm to the left  midline.  Four other 10-11's were placed in the upper abdomen.  Some  adhesions were taken down from the midline, and we began by first putting a  5 mm in the upper midline for the squiggly retractor which retracted the  left lateral segment nicely.  I then dissected the gastrohepatic window and  carried this anteriorly across the esophagogastric junction and began  dissecting the patient's left crus as well.  We were able to reduce this  hernia, and depicted in the chart is a picture of the hiatal hernia prior to  this.  Once this was completely mobilized, the sac seen to come down with  [place], and then we were able to get good length  into the abdomen.  I then freed  this up posteriorly, preserving the posterior vagus nerve and getting a good  sized window present.  I put a Penrose drain around this and used that for  retraction and got good length.   I then repaired the esophageal hiatus with two pledgeted sutures using the  EndoStitch, incorporating this with the Ethibond, and tying these outside  the abdomen.  When this was done, the lighted Bougie was then passed without  difficulty and with that in place, I constructed a wrap using contiguous  portions of the stomach and sutured with three sutures; each had the knots  clipped for radiographic visualization if necessary.  This anchored the wrap  above the EG junction.  A good healthy wrap is depicted at the end of the  case, and then the Bougie was removed following this without difficulty.  All sponge and needle counts were correct.  The Penrose drain had been  removed.  The port sites were not bleeding.  They were all injected with  0.5% Marcaine, and then the abdomen was deflated, and the wounds were closed  with 4-0 Vicryl and Benzoin and Steri-Strips.  The patient seemed to  tolerate the procedure well.  She was taken to the recovery room in  satisfactory condition.                                               Thornton Park Daphine Deutscher, M.D.    MBM/MEDQ  D:  10/06/2003  T:  10/06/2003  Job:  [id number]   cc:   Lonzo Cloud. Kriste Basque, M.D. [facility]   [name]. Jarold Motto, M.D. [facility]

## 2010-11-19 NOTE — Assessment & Plan Note (Signed)
[name]                             PULMONARY OFFICE NOTE   NAME:[name]                      MRN:          [id number]  DATE:06/29/2006                            DOB:          31-Oct-1924    HISTORY OF PRESENT ILLNESS:  The patient is a 77 year old, white female  patient of Dr. Jodelle [name] who has a known history of hypertension and  congestive heart failure who presents for a 1-week history of productive  cough with thick, yellow mucus, hoarseness, and shortness of breath.  The patient denies any chest pain, orthopnea, PND, or leg swelling.   PAST MEDICAL HISTORY:  Reviewed.   CURRENT MEDICATIONS:  Reviewed.   PHYSICAL EXAM:  The patient is an elderly female in no acute distress.  She is afebrile with stable vital signs.  O2 saturation is 97% on room  air.  HEENT:  Unremarkable.  NECK:  Supple without adenopathy or JVD.  Lung sounds reveal coarse breath sounds without any wheezing or  crackles.  CARDIAC:  Regular rate.  ABDOMEN:  Soft and benign.  EXTREMITIES:  Warm without any edema.   IMPRESSION AND PLAN:  Acute upper respiratory infection.  The patient is  to begin Omnicef x5 days.  Mucinex-DM twice daily.  Endal-HD 8 ounces  1/2 to 1 teaspoon every 6 hours as needed for cough.  The patient is to  return as scheduled with Dr. Kriste [name] or sooner if needed.      Rubye Oaks, NP  Electronically Signed      Lonzo Cloud. [name] Basque, MD  Electronically Signed   TP/MedQ  DD: 07/03/2006  DT: 07/03/2006  Job #: [id number]

## 2010-11-19 NOTE — Op Note (Signed)
[name], [name]                         ACCOUNT NO.:  [id number]   MEDICAL RECORD NO.:  [id number]                   PATIENT TYPE:  OUT   LOCATION:  XRAY                                 FACILITY[facility]  [facility]   PHYSICIAN:  [name]. Jarold Motto, M.D. [facility]        DATE OF BIRTH:  June 07, 1925   DATE OF PROCEDURE:  08/19/2003  DATE OF DISCHARGE:  08/21/2003                                 OPERATIVE REPORT   PROCEDURE PERFORMED:  Esophageal manometry.   Esophageal manometry was completed at [facility] on August 19, 2003.  This patient suffers from chronic refractory reflux, coughing, and  occasional dysphagia.   Results of the manometry as follows:  1. Upper esophageal sphincter normal.  2. Lower esophageal sphincter.  The lower esophageal sphincter is     incompetent with pressure less than 10 mmHg.  There appeared to be normal     relaxation with swallow.  3. Motility pattern.  There are normally propagated peristaltic waves of     normal amplitude and duration throughout the length of the esophagus with     wet and dry swallows.  Mean amplitude of contractions approximately 140     mmHg.   ASSESSMENT:  This is a normal manometry except for an incompetent lower  esophageal sphincter consistent with the patient's refractory reflux  symptoms.   RECOMMENDATIONS:  Refer to Dr. Ovidio Kin for consideration of  laparoscopic fundoplication.                                               [name]. Jarold Motto, M.D. [facility]    DRP/MEDQ  D:  09/02/2003  T:  09/02/2003  Job:  [id number]   cc:   Lonzo Cloud. Kriste Basque, M.D. [facility]   Sandria Bales. Ezzard Standing, M.D.  [address], [address]  [place]  [place] [address]  Fax: [phone]

## 2010-11-19 NOTE — Op Note (Signed)
[name], [name]               ACCOUNT NO.:  [id number]   MEDICAL RECORD NO.:  [id number]          PATIENT TYPE:  AMB   LOCATION:  ENDO                         FACILITY:  [facility]   PHYSICIAN:  Sandria Bales. Ezzard Standing, M.D.  DATE OF BIRTH:  1925-03-14   DATE OF PROCEDURE:  02/08/2005  DATE OF DISCHARGE:                                 OPERATIVE REPORT   PREOPERATIVE DIAGNOSIS:  Esophageal spasm and pain, status post hiatal  hernia repair.   POSTOPERATIVE DIAGNOSIS:  Normal duodenum, normal stomach.  Probable small  slipped hiatal hernia with normal esophagus.   PROCEDURE:  Esophagogastroduodenoscopy.   SURGEON:  Dr. Ezzard Standing   ANESTHESIA:  1.  Demerol 50 mg.  2.  Versed 3 mg.   COMPLICATIONS:  None.   INDICATION FOR PROCEDURE:  [name] is a 77 year old white female, who in  approximately May 2005, had a large hiatal hernia repair laparoscopically by  Dr. Wenda Low.  She has developed some retrosternal pain and apparently  has been evaluated from a cardiac standpoint, and there si a question  whether this actually has a GI cause.   The indications, potential complications of the endoscopy explained to the  patient.  Potential complications include but not limited to, perforation  and bleeding.   OPERATIVE NOTE:  The patient had the back of her throat anesthetized with  Cetacaine.  Her airway was measured ASA2 from an anesthesia standpoint.  She  had nasal oxygen in place, pulse oximetry in place, EKG and blood pressure  cuff.   After her throat was anesthetized with Cetacaine, I placed a bite-block in  her mouth, rolled her on her left side, and passed the Olympus endoscope  down the back of her throat without difficulty.   I cannulated the pylorus with the scope and got down to the second portion  of the duodenum.  The duodenum was normal with normal mucosa and no evidence  of ulcer.  The pylorus itself was widely patent without evidence of ulcer or  scarring.  Her  gastric mucosa was unremarkable for any ulceration,  inflammation, or changes.   In retroflexing the scope, I could see what looked like a fold coming off  the left side.  It looks like when I pulled the scope back through this  fold, this was at about the 40 cm mark.  Her Z-line of her esophagogastric  junction was at about the 36-37 cm mark.  It was hard to tell whether this  fold was due to part of a wrap or his was part of the diaphragm.   Her esophagus was unremarkable for any mass or lesion, and actually her  esophagus and where this fold is was widely patent, so there no evidence of  any obstructive pattern that I could see.   It is unclear what the cause of her pain is.  I think she does have a small  slippage of either a wrap or her hiatal hernia.  Again, it is hard to tell  whether she has a fold from the wrap or fold from the diaphragm, but this  represents a 3-4 cm slippage/hiatal hernia.   Dr. Daphine Deutscher was present during the entire case and saw the entire case.  The  patient tolerated the procedure well.  Discussion with the husband after the  case, and the patient will see Dr. Daphine Deutscher back in about 4 weeks.      Sandria Bales. Ezzard Standing, M.D.  Electronically Signed     DHN/MEDQ  D:  02/08/2005  T:  02/08/2005  Job:  [id number]   cc:   [place] Daphine Deutscher, MD  [address]  [place]  [place] [address]   Lonzo Cloud. Kriste Basque, M.D. [facility]  [address]  [place]  [place] [address]

## 2010-11-19 NOTE — Op Note (Signed)
[name], [name]               ACCOUNT NO.:  [id number]   MEDICAL RECORD NO.:  [id number]          PATIENT TYPE:  INP   LOCATION:  1413                         FACILITY:  [facility]   PHYSICIAN:  Thornton Park. Daphine Deutscher, MD  DATE OF BIRTH:  [birth year]   DATE OF PROCEDURE:  04/26/2005  DATE OF DISCHARGE:                                 OPERATIVE REPORT   PREOPERATIVE DIAGNOSIS:  Recurrent hiatal hernia.   POSTOPERATIVE DIAGNOSIS:  Recurrent hiatal hernia.   PROCEDURE:  Laparoscopic repair of hiatus hernia with Gore-Tex patch and  redo laparoscopic Nissen fundoplication.   SURGEON:  Dr. Daphine Deutscher   ASSISTANT:  Dr. Johna Sheriff.   ANESTHESIA:  General endotracheal.   SPECIMENS:  None.   ESTIMATED BLOOD LOSS:  Minimal.   COMPLICATIONS:  None.  Wrap done over a #50 dilator.   DESCRIPTION OF PROCEDURE:  [name] is a 77 year old lady with above-  mentioned problems noted on an upper GI.  She was taken to OR 1, given  general anesthesia.  Entered the abdomen using an OptiVu technique without  difficulty and insufflated the abdomen. A total of 5 trocars were used.  I  used the harmonic scalpel first to take the adhesions down from the left  lateral segment to the upper portion of the stomach and demonstrated a  fairly large tear of the anterior crus, allowing stomach to kind of herniate  above the wrap, outside of the wrap, and into the chest.  This was reduced  using the harmonic to take down the sac and was pretty well mobilized.  I  then put a Penrose drain down around the distal esophagus.  I could see the  repair of the posterior hiatus with pledgets, and that seemed to be intact.  I also took down the wrap and delineated this and set that up for subsequent  redo.  With a 50 lighted bougie in place, I went in and repaired the hiatus  using a Gore-Tex patch.  I cut this in a semicircular fashion and sewed the  round edge with a running 4-0 nylon to keep the rough side to the rough  side, exposing only the smooth side.  This was then sewn to the edges of the  anterior left and right crus to patch the hiatus.   When this was completed, I tacked it down posteriorly to the esophagus and  then also later to the wrap.  The wrap was then redone with 3 sutures.  All  intracorporeal suturing was done with the EndoStitch and a braided permanent  suture.  When completed, a good healthy wrap was in place.  The 50 lighted  bougie was removed and the trocars removed. And the wounds were closed with  4-0 Vicryl, Benzoin, and Steri-Strips.  The patient seemed to tolerate the  procedure well and was taken to the recovery room in satisfactory condition.      [place] Park Daphine Deutscher, MD  Electronically Signed     MBM/MEDQ  D:  04/26/2005  T:  04/26/2005  Job:  [id number]   cc:   Lonzo Cloud. Kriste Basque, M.D.  [facility]  [address]  [place]  [place] [address]   [name]. Jarold Motto, M.D. [facility]  [address]  [place]  [place] [address]

## 2010-11-19 NOTE — Assessment & Plan Note (Signed)
[facility]                         GASTROENTEROLOGY OFFICE NOTE   NAME:Krasinski, ZYANNA LEISINGER                      MRN:          [id number]  DATE:07/27/2006                            DOB:          1924-08-30    Royce underwent 24-hour pH probe testing on July 10, 2006.  This  showed a total DeMeester score that was 21, which was normal.  She had a  borderline increased percentage of time the pH was less than of 5.9%,  normal being less than 6.3%.  There were 95 upright reflux episodes and  only 2 longer than 5 minutes in duration.  There was a positive symptom  index for heartburn but not for chest pain or trouble swallowing.  There was no significant acid reflux in recumbent position.   As from my previous notes, Mrs. Mathia has had two previous hiatal  hernia repairs.  Her last endoscopy in July did show evidence of some  Barrett's mucosa but her ramp appeared to be intact, and esophageal  biopsies showed no evidence of eosinophilic esophagitis, but did show  evidence of Barrett's mucosa.  She is currently taking Zegerid 40 mg  twice a day and has pretty good success with her acid reflux as long as  she takes her medications.  Her main complaint today is continued  burning and also some pain in her left upper quadrant area.  She has  known diverticulosis and in the past has had some thickened red haustral  folds in her sigmoid colon area.  Last colonoscopy was in August of 2004  which showed probable segmental colitis.   Exam today shows a weight of 165 pounds which is up from a normal  weight, blood pressure 138/88.  Pulse was 74 and regular.  CHEST:  Entirely clear.  There were no murmurs, gallops or rubs noted.  Could not appreciate chest wall tenderness.  ABDOMINAL EXAM:  Entirely unremarkable except for some vague discomfort  in the left lower quadrant area of the sigmoid colon.  Bowel sounds were  normal.   ASSESSMENT:  1. This patient has  a so-called super sensitive esophagus from acid      reflux and recent 24 hour pH probe testing did not really confirm      significant reflux while on therapy.  I am beginning to wonder if      there may be an element of bile reflux involved with her acid      reflux disease.  2. Probable segmental colitis associated with diverticulosis.  3. Chronic medical problems otherwise followed by Dr. Kriste Basque.   RECOMMENDATIONS:  1. Trial of Carafate suspension 1 tsp after meals and at bedtime.      Will continue Zegerid 40 mg twice a day.  2. Trial of Lialda 2.4 g p.o. per day.  3. Continue other medications per Dr. Kriste Basque.  4. GI followup in one month's time.  If she continues to be      symptomatic we will consider referral to tertiary medical center.     [name]. Jarold Motto, MD, Caleen Essex, FAGA  Electronically Signed  DRP/MedQ  DD: 07/27/2006  DT: 07/27/2006  Job #: [id number]   cc:   Lonzo Cloud. Kriste Basque, MD  [place] Daphine Deutscher, MD

## 2010-11-19 NOTE — Op Note (Signed)
[place]  Patient:    [name], [name] Visit Number: [id number] MRN: [id number]          Service Type: SUR Location: 5000 [id number] Attending Physician:  Lubertha South Dictated by:   Kerrin Champagne, M.D. Proc. Date: 12/10/01 Admit Date:  12/10/2001                             Operative Report  PREOPERATIVE DIAGNOSIS:  Left-sided lateral recess stenosis, L3-4 and L4-5 with foraminal entrapment, left L3 and L4 nerve roots.  POSTOPERATIVE DIAGNOSIS:  Primarily lateral recess stenosis left side  at L4-5, and foraminal entrapment, left side at the L3-4 level.  Compression of both left L3 and L4 nerve roots.  The left L5  nerve root also appeared to be entrapped secondary to lateral recess stenosis at L4-5.  OPERATION PERFORMED:  Left-sided hemilaminectomy with excision of the spinous process of L3 and L4 and the left side hemilamina of L4 and nearly complete total hemilaminectomy at L3.  Left-sided partial facetectomy at the L3-4 level and L4-5 level with foraminotomy of the left L3 and L4 nerve roots. Foraminotomy left L5 nerve root.  SURGEON:  Kerrin Champagne, M.D.  ASSISTANT:  Javier Docker, M.D.  ANESTHESIA:  General orotracheal, Dr. Laverle Hobby.  ESTIMATED BLOOD LOSS:  700 cc.  DRAINS:  Hemovac times one, Foley catheter to straight drain.  INDICATIONS FOR PROCEDURE:  The patient is a 77 year old female with a nearly two year history of left anterior thigh pain with radiation to the left knee. She has undergone extensive evaluation and previous studies have indicated foraminal disk protrusions, left side L3-4 and L4-5 and she has been treated conservatively with selective nerve root blocks and then later with left-sided facet blocks at L3-4 and L4-5.  She has done well with conservative management and only recently over the last three to four months has developed progressively worsening left thigh pain with neurogenic  claudication that is unrelieved by further injection treatment.  She underwent a restudy with MRI which demonstrated severe degenerative disk changes at L4-5 and L3-4 with foraminal entrapment at the L3-4 level, lateral  recess stenosis at both L3-4 and L4-5 on the left side affecting the L3 and L4 nerve roots primarily.  She was brought to the operating room to undergo left-sided foraminotomies at L3 and L4 as well as lateral recess decompression for findings of lateral  recess stenosis, left L3-4 and L4-5.  DESCRIPTION OF PROCEDURE:  After adequate general anesthesia, the patient in the knee-chest position, Andrews frame, standard preoperative antibiotics, standard prep with DuraPrep solution draped in the usual manner, then a Vi-drape was used.  Incision after lateral radiograph demonstrated the spinal needle at the L3 level superiorly and the incision carried about an inch and a half inferiorly, superior to the needle, another inch and a half over a three inch area.  This was done after infiltration of the skin and subcutaneous layers with Marcaine 0.5% with 1:200,000, incision in the midline through the skin and subcutaneous layers down to the lumbodorsal fascia in the midline. The lumbodorsal fascia was then incised on both sides of the expected spinous process of L3 and L4, clamps placed over these spinous process and a second intraoperative radiograph demonstrating the upper clamp at L2 and the lower at L3 such that the incision was carried lower by another inch and carried through the skin and subcutaneous  layers down to the lumbodorsal fascia and the spinous process of L4 inferiorly.  The lumbodorsal fascia was then incised on both sides of the spinous process of L4. Two Cobbs were then used to elevate the paralumbar muscles bilaterally off the posterior aspects of the lamina and spinous process at L4 and L5, incising the muscle attachments off the bone using electrocautery.   McCullough retractors were then inserted exposing the posterior aspects of the spinous processes and lamina from the L4-5 inferiorly up to L2.  A Leksell rongeur was then used to remove the spinous process of L3 and L4.  Then used to thin the posterior aspect of the lamina of L3 and L4.  A 3 mm Kerrison was introduced beneath the inferior aspect of the lamina at L4 and used to remove central portions of the lamina and left hemilaminectomy at the L4 level.  This was carried then through the L3-4 level and a semihemilaminectomy carried out on the left side at the L3 level removing about 75% of the lamina on the left side.  This was done in order to be able to visualize the left side thecal sac out the neural foramen during foraminotomies.  A 3 mm Kerrison rongeur was then used to resect the ligamentum flavum and its attachments off the medial aspect of the facet at the L3-4 level off the anterior aspect of the lamina of the left side at L3 and then carried distally and caudally again resecting the ligamentum flavum off the medial aspect of the facet at L4-5 performing lateral recess decompression by resecting about 30% of the facet at L4-5 and L3-4.  Bleeding was significant associated with bone bleeding primarily.  Bone wax was applied to bleeding cancellous bone surfaces.  Bipolar electrocautery was used to control small epidural bleeding. Eventually a median vein underlying the midportion of the lamina centrally was able to be hemostased using bone wax decreasing blood loss at that point.  The lateral recess was visualized using a DErrico retractor and the medial aspect of the L3-4 and 4-5 facets were further resected to allow for exposure of the lateral aspect of the thecal sac and the L3 and L4 nerve roots identified. The L3 nerve root as it exited was found to be severely entrapped due to reflected portion of ligamentum flavum.  Very large spurs off the posterior aspect of the disk  space at L3-4 appeared to be causing entrapment within the foramen along the first and second portions of the lateral recess.  By  resecting the medial aspect of the facet, however, the lateral recess was well decompressed.  The undersurface of the facet was also resected further decompressing the left L3 neural foramen such that a hockey stick probe could be passed out the neural foramen both anterior and posterior to the L3 nerve root demonstrating a well-decompressed left L3 neural foramen.  Similarly, the neural foramen for the L4 nerve root was decompressed by resecting the ligamentum flavum that was reflected and a small portion of the superior aspect of the superior articular process of L5.  This resection of the superior aspect of the superior articular process of L5 decompressed the L4-5 neural foramen nicely.  Foraminotomy was performed over the L5 nerve root inferiorly.  The ligamentum flavum was resected along the left side at the L4-5 level to further decompress left side at L4-5 thecal sac.  Bleeding was controlled using bipolar electrocautery as well as thrombin soaked Gelfoam and Surgicel.  Bleeding was completely hemostased.  A medium Hemovac drain was placed in the depth of the incision.  A small portion of thrombin soaked Gelfoam was placed over the laminotomy defect central and left side.  The deep muscle layers were then approximated loosely in the midline with interrupted #1 Vicryl sutures.  Fascial layer approximated with interrupted #1 and 0 Vicryl sutures in simple fashion.  Deep subcutaneous layer was reapproximated with interrupted #1 and 0 Vicryl sutures, the more superficial layers with interrupted 2-0 Vicryl sutures.  Skin closed with a running subcutaneous stitch of 4-0 Vicryl.  Tincture of benzoin and Steri-Strips applied, 4 x 4s and ABD pad, fixed to the skin with HypaFix tape.  The patient was then reactivated and returned to the recovery room following  return to a supine position extubated in satisfactory condition. Dictated by:   Kerrin Champagne, M.D. Attending Physician:  Lubertha South DD:  12/10/01 TD:  12/12/01 Job: [id number]

## 2010-11-19 NOTE — H&P (Signed)
[place]  Patient:    [name], [name] Visit Number: [id number] MRN: [id number]          Service Type: SUR Location: 5000 5023 01 Attending Physician:  Lubertha South Dictated by:   Alexzandrew L. Perkins, P.A.-C. Admit Date:  12/10/2001                           History and Physical  DATE OF BIRTH:  01-23-1925  DATE OF OFFICE VISIT HISTORY AND PHYSICAL:  December 05, 2001  CHIEF COMPLAINT:  Back pain and leg pain.  HISTORY OF PRESENT ILLNESS:  The patient is a 77 year old female who has been seen and evaluated by Dr. Kerrin Champagne for ongoing back pain and left leg pain.  She has been treated conservatively in the past utilizing medications; she has also undergone localized injections to the left side at L3-4 and L4-5 without relief of her pain.  She does state the pain that she is having is interfering with her daily activities and lifestyle.  She has discomfort with standing and walking.  The pain does radiate into the left anterior thigh and down into the knee and into the anterior aspect of her leg.  It does worsen with standing and walking and even has difficulty standing and doing her dishes.  She was sent for an MRI which did show lateral recess stenosis on the left at L4-5 and some foraminal entrapment with lateral disk protrusion at L3-4, affecting the L3 nerve root.  It is felt due to her significant pain and the findings on MRI, she could benefit from undergoing surgery.  Risks and benefits have been discussed and she is subsequently admitted to the hospital.  ALLERGIES:  CODEINE.  CURRENT MEDICATIONS:  1. Nexium 40 mg daily.  2. Plavix 75 mg daily -- stopped prior to surgery.  3. Evista 60 mg daily.  4. Synthroid 88 mcg daily.  5. Metoclopramide 10 mg three times a day.  6. Toprol-XL 50 mg daily.  7. Norvasc 5 mg daily.  8. Alprazolam 0.5 mg daily.  9. Imipramine 25 mg two tablets at bedtime. 10. Aspirin 81 mg  daily -- stopped prior to surgery.  PAST MEDICAL HISTORY:  CVA in 1993, hypertension, hiatal hernia, constipation, history of breast cancer.  PAST SURGICAL HISTORY:  Back surgery in 1960s, hysterectomy in 1970, a right mastectomy in 1991; she has also had right breast reconstruction in 1996.  SOCIAL HISTORY:  She is married.  Denies the use of tobacco products or alcohol products.  Her husband will assist with her care following her surgery.  She has a one-story home with two stairs entering.  FAMILY HISTORY:  Mother deceased at age 48 with a history of stroke.  She does have a sister with rheumatic fever.  Father deceased at age 53 with a history of emphysema and MI.  REVIEW OF SYSTEMS:  GENERAL:  No fevers, chills or night sweats.  NEUROLOGIC: The patient does have a history of CVA.  No blurred vision, double vision or blackout spells.  RESPIRATORY:  No shortness of breath, productive cough or hemoptysis.  CARDIOVASCULAR:  She does have a history of hypertension.  No chest pain or angina.  GI:  She does have a history of a hiatal hernia which does cause some indigestion and also a history of chronic constipation. Denies nausea, vomiting, diarrhea, blood or mucus in the stool.  GU:  No dysuria, hematuria or discharge.  MUSCULOSKELETAL:  Pertinent to the back and the left leg found in the history of present illness.  PHYSICAL EXAMINATION:  VITAL SIGNS:  Pulse 84, respirations 16, blood pressure 146/78.  GENERAL:  The patient is a 77 year old white female, well-nourished, well-developed, who appears to be in no acute distress.  She is accompanied by her husband, alert, oriented and cooperative.  HEENT:  Normocephalic, atraumatic.  Pupils are round and reactive.  EOMs are intact.  Oropharynx clear; the patient does have upper and lower dentures noted.  NECK:  Supple.  No carotid bruits.  CHEST:  Clear, anterior and posterior chest walls.  No rhonchi, rales  or wheezing.  HEART:  Regular rate and rhythm.  No murmurs.  ABDOMEN:  Soft and nontender.  Bowel sounds are present.  RECTAL:  Not done, not pertinent to present illness.  BREASTS:  Not done, not pertinent to present illness.  GENITALIA:  Not done, not pertinent to present illness.  EXTREMITIES:  She has deep tendon reflexes noted at the knees, +2 and symmetrical, and 0 at the ankles bilaterally.  She has diminished left foot dorsiflexion and especially against resistant on motor function testing. Sensation ______.  Pulses noted to be intact.  IMPRESSION:  1. Lumbar spinal stenosis.  2. History of stroke, 82.  3. Hypertension.  4. Hiatal hernia.  5. Chronic constipation.  6. Past history of breast cancer.  PLAN:  The patient will be admitted to [facility] to undergo a lateral recess decompression at L3-4 and L4-5.  Surgery will be performed by Dr. Kerrin Champagne. Dictated by:   Alexzandrew L. Perkins, P.A.-C. Attending Physician:  Lubertha South DD:  12/07/01 TD:  12/08/01 Job: [id number]

## 2010-11-19 NOTE — Assessment & Plan Note (Signed)
[facility]                         GASTROENTEROLOGY OFFICE NOTE   NAME:[name], [name] RIEF                      MRN:          [id number]  DATE:07/10/2005                            DOB:          [birth year]    This is an esophageal 24-hour pH probe test.   [name] is a 77 year old white female who has had chronic acid  reflux and two Nissen hernia repairs by Dr. Daphine Deutscher.  She continues to  have severe chest pain and reflux symptoms, but has actually done better  recently on Zegerid 40 mg twice a day.  We performed 24-hour pH probe  testing on therapy to see if she is having significant acid reflux.  The  results are as follows:   The patient has a total DeMeester score of 21, normal being less than  22.  There is borderline increase in the percentage of time the pH is  less than 4, it is 5.9%, normal being less than 6.3%.  There are 95  brief upright episodes noted during this 24-hour period, but only 2 are  longer than 5 minutes.  There is a positive symptom index for  heartburn at 72%, but there is no positive symptom correlation for her  complaints of chest pain or trouble swallowing.  There is no acid reflux  whatsoever in the recumbent position.   ASSESSMENT:  Taken overall, this 24-hour pH probe test result is not out  of the normal range.  The patient seems to have a very sensitive  esophagus to any acid reflux, which does occur in the upright position,  it is very brief in nature.  We will probably go ahead and increase her  proton pump inhibitor therapy to 3 times a day, to see how she does  symptomatically.     [name]. Jarold Motto, MD, Caleen Essex, FAGA  Electronically Signed    DRP/MedQ  DD: 07/18/2006  DT: 07/19/2006  Job #: [id number]   cc:   [place] Daphine Deutscher, MD  Lonzo Cloud. Kriste Basque, MD

## 2010-11-19 NOTE — Discharge Summary (Signed)
[place]  Patient:    [name], [name] Visit Number: [id number] MRN: [id number]          Service Type: SUR Location: [id number] Attending Physician:  Lubertha South Dictated by:   Ottie Glazier Wynona Neat, P.A.-C. Admit Date:  12/10/2001 Discharge Date: 12/13/2001                             Discharge Summary  ADMISSION DIAGNOSES: 1. Left L3-L4, L4-L5 lateral recess stenosis and foraminal stenosis L3-L4 and    foraminal stenosis at L5. 2. History of depression. 3. History of hypertension. 4. History of hiatal hernia. 5. History of constipation.  DISCHARGE DIAGNOSES: 1. Left L3-L4, L4-L5 lateral recess stenosis and foraminal stenosis L3-L4 and    foraminal stenosis at L5, improved. 2. History of depression. 3. History of hypertension. 4. History of hiatal hernia. 5. History of constipation.  PROCEDURES PERFORMED:  Left foraminotomy, L3-L4, L5 with left hemilaminectomy L3-L4 and L4-L5, surgeon Dr. Vira Browns.  CONSULTS:  Physical medicine and rehabilitation, Dr. Thomasena Edis.  HISTORY OF PRESENT ILLNESS:  The patient is a 77 year old female who had been seen and evaluated at the [facility] by Dr. Vira Browns for chronic ongoing back pain and left leg radicular pain. The patient has failed conservative treatment in the past and had also undergone localized injections of left-sided L3-L4 and L4-L5 without relief. The patient was found to have lateral recess stenosis on the left at L4-L5 and foraminal entrapment with lateral disk protrusion at L3-L4 affecting the L3 nerve root. Due to her increasing symptomatology, the decision was made to proceed with operative intervention.  HOSPITAL COURSE:  The patient was admitted on December 10, 2001, and underwent the above procedure per Dr. Vira Browns with Dr. Shelle Iron assisting. Please see operative report for details. On postoperative day #1 the patient was having some problems with  nausea, stating she had vomited once that morning. She was unable to eat breakfast. She did state that her leg pain had improved since surgery, otherwise she had no complaints.  Examination of her back showed her dressings were clean, dry and intact. A Hemovac showed clotting in the tube, no blood in the bag, therefore it was removed without complications. The bilateral lower extremities showed she had good dorsi and plantar flexion. Her EHL was 4/5, sensation was intact. Abdomen was soft, nontender, bowel sounds were positive. Her PCA pump was discontinued at that time. She was given Protonix 40 mg IV and she was to continue her p.o. medications. Her Foley catheter was discontinued. Percocet was changed to Mepergan 4 a day, 1 to 2 p.o. q.4-6h. p.r.n. Later that day the patient was seen and evaluated by physical therapy. Initially they recommended outpatient physical therapy.  The patient on postoperative day #2 was awake. Her husband was in the room with her. She states she was sore as expected, otherwise she was doing well. Vital signs were stable. Her temperature was 99.3, hemoglobin was 11.4. Her lower back dressings were changed and the incisions were clean, dry and intact, no erythema or discharge. The bilateral lower extremity sensation was intact. The EHL was positive with positive dorsiflexion and plantar flexion.  Physical therapy and occupational therapy continued that day. However, she did not progress well with physical therapy that day, and after a discussion with them, it was recommended that a rehabilitation consult was placed. Her hip well was discontinued that evening.  Discharge planning was also consulted. Dr. Thomasena Edis from physical medicine and rehabilitation saw and evaluated the patient and noted that if she continued to progress well, she may be discharged. Otherwise she may be a candidate for rehabilitation and he would continue to monitor her progress.  On  postoperative day #3 the patient was doing quite well. She felt much better, eating breakfast. She states she had been up to the restroom early that morning and bathed herself. She complained of no bowel movements since her hospitalization, however, she did note she had a long history of constipation. She denied any bowel or bladder incontinence, no numbness and tingling. She states that her pain medications made her very tired and sometimes hard to talk.  Her blood pressure was 107/51, vital signs were stable and she was afebrile. Her lower back dressings were changed and the incision was clean, dry and intact. The skin was intact with no erythema or discharge. The bilateral lower extremities had good sensation, positive dorsiflexion and plantar flexion and EHL 5/5. Her abdomen was soft and nontender, bowel sounds were positive, somewhat hypoactive. Due to her improved condition and her progress with physical therapy, the decision was made to discharge her home in the care of her family with [facility] services following her postoperatively.  FINAL DIAGNOSES: 1. Status post L3-L4, L4-L5 hemilaminectomy. 2. History of depression. 3. History of hypertension. 4. History of hiatal hernia. 5. History of constipation.  CONDITION ON DISCHARGE:  Improved.  DISCHARGE INSTRUCTIONS:  Diet is regular.  For activity she is to keep her dressing clean, dry and intact, no heavy lifting, pushing, pulling or pinning. She may shower on postoperative day #5. [facility] Services will be following her for physical therapy and occupational therapy needs.  DISCHARGE MEDICATIONS: 1. Percocet 5/325 1 to 2 tabs p.o. q.4-6h. p.r.n. pain, #40. 2. Robaxin 500 mg 1 p.o. q.6h. p.r.n. spasm, #40. 3. She will receive a laxative prior to discharge in anticipation of a bowel    movement and she is to continue her regular stool softeners, etc, at home,    when she is discharged.  FOLLOWUP:  She  is to follow up with Dr. Otelia Sergeant in 7 to 10 days. Call [phone] for an appointment or questions or concerns.  Dictated by:   Ottie Glazier. Wynona Neat, P.A.-C. Attending Physician:  Lubertha South DD:  01/03/02 TD:  01/07/02 Job: [id number]

## 2010-11-19 NOTE — Assessment & Plan Note (Signed)
[facility]ALTH[facility]                         GASTROENTEROLOGY OFFICE NOTE   NAME:[name], [name]                      MRN:          [id number]  DATE:08/24/2006                            DOB:          [birth year]    HISTORY OF PRESENT ILLNESS:  [name] continues with chest pain despite  taking Zegerid 40 mg b.i.d. and Carafate q.i.d.  As per her previous  history, she has had at least two hiatal hernia repairs and recent upper  GI.  Endoscopy showed no evidence of significant recurrence of her acid  reflux.  She does have a short segment of Barrett's mucosa.  Last  endoscopy was in July 2007.  Biopsies for eosinophilic esophagitis have  been negative.   She also has a segmental colitis and has diarrhea with rectal bleeding.  I recently placed her on Lialdia 2.4 g daily, and she has had no  improvement over the last month, continues with diarrhea and perirectal  discomfort.  She has abdominal gas and bloating and cramping.  Her last  colonoscopy was several years ago.   She has had recent cardiac and pulmonary evaluations for chest pain, and  these have been negative.  She does have a history of hypertension,  mitral valve prolapse and stable coronary artery disease and history of  TIA's.  She also has chronic hypothyroidism.  She does not have any  history of diabetes.  She has had multiple surgical procedures including  inguinal hernia repair, hysterectomy, appendectomy, mastectomy for  breast cancer.  She is followed from cardiac standpoint by Dr.  Gerri Spore.  She carries the diagnosis of hypertensive cardiomyopathy.   MEDICATIONS:  1. Zegerid.  2. Carafate.  3. Lialdia.  4. Verapamil 180 mg daily.  5. Plavix 75 mg daily.  6. Evista 60 mg daily.  7. Synthroid 0.088 mg daily.  8. __________  500 mg two a day.  9. Toprol XL 100 mg daily.   ALLERGIES:  SENSITIVITY TO CODEINE.   FAMILY HISTORY:  Noncontributory.   REVIEW OF SYSTEMS:  I can otherwise  get no specific cardiovascular or  pulmonary complaints at this time.  She gives no symptoms of congestive  heart failure or peripheral vascular disease.  Despite all of her above  complaints, her appetite is good and she is actually gaining weight.   PHYSICAL EXAMINATION:  GENERAL:  She is a healthy appearing white female  in no acute distress with no stigmata or chronic liver disease.  Her  chest is clear and I cannot appreciate murmurs, gallops, rubs.  She  appears to be in regular rhythm.  There is no hepatosplenomegaly,  abdominal masses, tenderness or significant distention.  Bowel sounds  are normal.  There is no peripheral edema, phlebitis or swollen joints.  Mental status is clear.  Inspection of the rectum shows perirectal anal  erythema and irritation of the rather prominent posterior skin tag and  irritation with some external hemorrhoids present.  Rectal exam showed  no masses or tenderness with soft stool in the rectal vault that was  markedly guaiac positive.   ASSESSMENT:  1. History of  refractory chest pain with previous 24-hour pH probe      monitoring, all with therapy showing multiple upright acid reflux      episodes which I am surprised have not responded to acid      suppressive therapy, making me think that she has some functional      chest pain problems.  She has had an extensive upper GI workup and      may need referral to a tertiary medical center.  2. Continue diarrhea with guaiac positive stools and history of      segmental colitis and possible Crohn's disease.  3. Multiple medical problems, hypertensive cardiomyopathy.  4. Status post mastectomy for breast carcinoma.  5. Status post multiple surgical procedures.   RECOMMENDATIONS:  1. Check screening laboratory parameters.  2. Outpatient colonoscopy exam.  3. Increase Zegerid to 40 mg t.i.d. before meals with Carafate after      meals and at bedtime.  4. Consider tertiary medical center referral.   5. Other medications follow up with Dr. Kriste Basque as previously planned.     [name]. Jarold Motto, MD, Caleen Essex, FAGA  Electronically Signed    DRP/MedQ  DD: 08/24/2006  DT: 08/24/2006  Job #: [id number]   cc:   Lonzo Cloud. Kriste Basque, MD  [place] Daphine Deutscher, MD

## 2010-11-24 ENCOUNTER — Telehealth: Payer: Self-pay | Admitting: Pulmonary Disease

## 2010-11-24 MED ORDER — SUCRALFATE 1 GM/10ML PO SUSP: ORAL | Status: DC

## 2010-11-24 NOTE — Telephone Encounter (Signed)
Called and spoke with pt and she stated that the mail order keeps sending her 30 day supply  Of the carafate--they told her that she will need a new rx sent in for this.  Pt is totally out of this med so a rx was to sent to local pharmacy to last until the shipment comes in .

## 2010-12-01 ENCOUNTER — Telehealth: Payer: Self-pay | Admitting: Pulmonary Disease

## 2010-12-01 NOTE — Telephone Encounter (Signed)
I called same and was disconnected. Will try back again in the morning.

## 2010-12-01 NOTE — Telephone Encounter (Signed)
Pt advised she received a letter today dated 11/23/10 stating "we are not able to supply larger amount" due to doctor denied or did not reply to request. Pt states # provided is [phone].

## 2010-12-02 NOTE — Telephone Encounter (Signed)
Pt called back.  She stated everything was okay now and didn't need anything else from [place].

## 2010-12-02 NOTE — Telephone Encounter (Signed)
[email or web address].Kweku Stankey, CMA  

## 2010-12-02 NOTE — Telephone Encounter (Signed)
Patient called back stated that she did receive her medicine today. She stated that she is sorry that you guys had so much trouble she can be reached at [phone].[name]

## 2011-02-02 ENCOUNTER — Ambulatory Visit: Payer: Medicare Other | Admitting: Pulmonary Disease

## 2011-02-15 ENCOUNTER — Ambulatory Visit: Payer: Medicare Other | Admitting: Pulmonary Disease

## 2011-02-17 ENCOUNTER — Inpatient Hospital Stay (INDEPENDENT_AMBULATORY_CARE_PROVIDER_SITE_OTHER)
Admission: RE | Admit: 2011-02-17 | Discharge: 2011-02-17 | Disposition: A | Discharge status: Home / Self Care | Payer: Medicare Other | Source: Ambulatory Visit | Attending: Emergency Medicine | Admitting: Emergency Medicine

## 2011-02-17 DIAGNOSIS — I10 Essential (primary) hypertension: Secondary | ICD-10-CM

## 2011-02-17 DIAGNOSIS — H6120 Impacted cerumen, unspecified ear: Secondary | ICD-10-CM

## 2011-03-01 ENCOUNTER — Encounter: Payer: Self-pay | Admitting: Pulmonary Disease

## 2011-03-01 ENCOUNTER — Ambulatory Visit (INDEPENDENT_AMBULATORY_CARE_PROVIDER_SITE_OTHER): Payer: Medicare Other | Admitting: Pulmonary Disease

## 2011-03-01 DIAGNOSIS — I739 Peripheral vascular disease, unspecified: Secondary | ICD-10-CM

## 2011-03-01 DIAGNOSIS — I10 Essential (primary) hypertension: Secondary | ICD-10-CM

## 2011-03-01 DIAGNOSIS — K219 Gastro-esophageal reflux disease without esophagitis: Secondary | ICD-10-CM

## 2011-03-01 DIAGNOSIS — G459 Transient cerebral ischemic attack, unspecified: Secondary | ICD-10-CM

## 2011-03-01 DIAGNOSIS — I509 Heart failure, unspecified: Secondary | ICD-10-CM

## 2011-03-01 DIAGNOSIS — I872 Venous insufficiency (chronic) (peripheral): Secondary | ICD-10-CM

## 2011-03-01 DIAGNOSIS — F411 Generalized anxiety disorder: Secondary | ICD-10-CM

## 2011-03-01 DIAGNOSIS — M545 Low back pain, unspecified: Secondary | ICD-10-CM

## 2011-03-01 DIAGNOSIS — M1990 Unspecified osteoarthritis, unspecified site: Secondary | ICD-10-CM

## 2011-03-01 DIAGNOSIS — K589 Irritable bowel syndrome without diarrhea: Secondary | ICD-10-CM

## 2011-03-01 DIAGNOSIS — C50919 Malignant neoplasm of unspecified site of unspecified female breast: Secondary | ICD-10-CM

## 2011-03-01 DIAGNOSIS — E039 Hypothyroidism, unspecified: Secondary | ICD-10-CM

## 2011-03-01 DIAGNOSIS — M949 Disorder of cartilage, unspecified: Secondary | ICD-10-CM

## 2011-03-01 DIAGNOSIS — M899 Disorder of bone, unspecified: Secondary | ICD-10-CM

## 2011-03-01 MED ORDER — METOPROLOL SUCCINATE ER 100 MG PO TB24: 100.0000 mg | ORAL_TABLET | Freq: Every day | ORAL | Status: DC

## 2011-03-01 MED ORDER — VERAPAMIL HCL ER 240 MG PO TBCR: 240.0000 mg | EXTENDED_RELEASE_TABLET | Freq: Every day | ORAL | Status: DC

## 2011-03-01 MED ORDER — LORAZEPAM 1 MG PO TABS: ORAL_TABLET | ORAL | Status: DC

## 2011-03-01 NOTE — Patient Instructions (Signed)
Today we updated your med list in EPIC...    We decided to increase your TOPROL XL to 100mg  daily; and increase you VERAPAMIL to 240mg /d...    We also changed your Xanax to LORAZEPAM 1mg  tabs- take 1/2 to 1 tab up to 3 times daily (try 1/2 during the days & a whole tab a night for rest)...  Be sure to avoid salt/ sodium in your diet...  Let's plan a recheck in 3 weeks time.Marland KitchenMarland Kitchen

## 2011-03-07 ENCOUNTER — Encounter: Payer: Self-pay | Admitting: Pulmonary Disease

## 2011-03-07 NOTE — Progress Notes (Signed)
Subjective:    Patient ID: [name], female    DOB: 1925-01-20, 77 y.o.   MRN: [id number]  HPI [name] here for a follow up visit... she has mult med problems as noted below...   ~  July 01, 2010:  Add-on for cough- mostly dry but feeling gets "hung" in esoph, and worse at night... Robitussin w/o help, she's distressed by this & under incr stress due to husb cancer... denies f/c/s, no discolored phlegm, etc... recall hx "supersens esoph" w/ full evals from DrPatterson, on max Rx w/ Nexium Bid & Carafate liq Qid... she's had prev Nissen in 90s, and re-do in 2006 (see below)... she has NOT been on max anti-reflux regimen & we discussed meds + elev HOB 6", no eating or drinking after dinner, PLUS rx w/ Depo/ Pred 10mg -7d taper, Symbicort, Tussionex...  ~  August 12, 2010:  states she improved w/ rx but cough "returned" 4d after she ran out of Pred, & the cough bothers her husb... we discussed getting back on Pred 10mg /d & using the Tussionex Prn (+continue all other therapies> Symbicort, Mucinex, elev HOB, not eating after dinner, Nexium, Carafate, etc)...  ~  September 15, 2010:  343mo ROV- breathing better/ improved & we discussed decr Pred from 10mg /d to 5mg /d til return... she is c/o "nervous in my chest at night" DrRamos rx w/ Lyrica, & c/o knee pain w/ shot in knee... reminded to take [name] regularly for nerves.  ~  Nov 02, 2010:  6wk ROV & she appears reasonably stable> on the Pred 10mg - 1/2 tab daily (keep same for now), and taking the Alpraz 0.5mg  Bid "it helps my nerves" but still c/o "nervous feeling in my chest- I get hot" & I think it would be worth a trial of [name] 25mg  prn for this...  Her CC is still her knee pain & this gets her focused off her other somatic complaints (still follows w/ DrAlusio & Ramos)...  No apparent resp exac; BP controlled on meds; denies ischemic cerebral or other symptoms; GI appears stable & she requests Carafate refill; etc...  ~  March 01, 2011:   43mo ROV & [name] passed away 343mo ago w/ brain mets from his lung cancer, hospice involved & [name] is doing as well as can be expected but feels very alone, daugh busy w/ her family etc;  She notes some incr in BP & assoc HA recently, measures 164/82 today & up to 180/90 at home> she's been on MetoprololER50 & Verap180 plus her Imdur30; we decided to incr to ToprolXL100 & Verap240 plus low salt diet & switch Xanax to Lorazepam1mg  & take more regularly... Plan ROV in 3wks to recheck BP; she had full labs 3/12 (reviewed); she saw DrRamos 5/12 for lumbar epid steroid injection==> some improvement.          Problem List:  BRONCHITIS, RECURRENT (ICD-491.9) - prev off Symbicort & stable until 12/11 w/ refractory cough (esp at night) & reflux related symptoms >>> we discussed Depo/ PRED10mg -7d taper, SYMBICORT160-2spBid, TUSSIONEX Qhs, & antireflux  regimen ==> improved but symptoms ret off the Pred therefore restarted Pred 10mg /d 2/12 w/ slower taper... ~  CXR 3/11 showed sternal fx, old right clavic fx, clear lungs, prev right breast surg. ~  CXR 12/11 showed sternal deformity, & few chr changes, s/p right breast ca surg clips, borderline Cor & Ao calcif, NAD.  HYPERTENSION (ICD-401.9) - on TOPROL XL 50mg /d, VERAPAMIL 180mg /d, & IMDUR 30mg /d... not on a diuretic  due to [facility]. ~  2DEcho 7/08 showed LVH and asymmetric septal hypertrophy- on Toprol & Verapamil. ~  3/12:  BP = 150/90 today & denies HA, visual changes, CP, palipit, dizziness, syncope, dyspnea, etc... she notes some fatigue and edema... ~  5/12:  BP= 132/70 & she is unchanged... ~  8/12:  BP elev w/ the stress of husb's passing; we decided to incr the ToprolXL to 100mg /d & the Verapamil to 240mg /d, plus low sodium etc...  CONGESTIVE HEART FAILURE (ICD-428.0) - pt was felt to have diast dysfuction by cardiology in 2007. ~  hx atypic CP w/ neg Myoview 5/07 showing no ischemia or infarction & EF=80%. ~  holter monitor w/ PVC's only (eval for palpit  and dizzy)  PERIPHERAL VASCULAR DISEASE (ICD-443.9) -  on PLAVIX 75mg /d, she stopped ASA on her own... Hx of ASPVD with previous TIA- evaluation revealed CT brain with moderate atrophy and small-vessel disease; MRI confirms; MRA showed moderate branch vessel arteriosclerotic changes and MRA of the neck showed several stenoses including 30% stenosis proximal right  innominate artery, 70% stenosis proximal left CCA, 45% prox left subclav, 40% right subclav, & ?12% prox left ICA stenosis. NOTE: some of this was felt to be artifactual by DrCooper on his eval 8/08- CDoppler w/ 0-39% bilat ICAs.   VENOUS INSUFFICIENCY (ICD-459.81) - she's had some edema in the past... control w/ low sodium, elevation, and support hose... avoid diuretics w/ ASH.  GERD (ICD-530.81) - on NEXIUM 40mg Bid, & CARAFATE 1gm/57ml- taking 1tspQid... ~  hx of "supersensitive esophagus" per DrPatterson w/ hx Nissen Fundoplication in the 90's and subseq re-do Lap Nissen w/ gore-tex patch in 2006 by DrMMartin. ~  prev EGD 7/07 showed HH surg x2, Barrett's esoph, gastritis w/ neg HPylori... ~  extensive eval in Jun09 by DrPatterson w/ recurrent reflux symptoms, CP & dysphagia... EGD w/ severe esophagitis/ gastritis- HPylori neg and Bx neg for eosinophilic esoph... Barium esophogram showed recurrent hernia w/ marked GE reflux... on max acid suppression and referred to DrMMartin for further surgery... he sent her back to DrPatterson w/ another EGD 10/09 showing intact Nissen, ?Barrett's & gastritis... she remains on max meds w/ Nexium/ Carafate. ~  12/11:  Add-on for noct cough & rec for max antireflux regimen> elev HOB 6" blocks, NPO after dinner, etc...  IRRITABLE BOWEL SYNDROME (ICD-564.1) - ? bact overgrowth in the past Rx'd w/ Xifaxan & Align... also taking LEVSIN Prn and IMIPRAMINE Bid... ~  last colonoscopy 3/08 by DrPatterson showed divertics only...  Hx of UTI (ICD-599.0)  Hx of ADENOCARCINOMA, BREAST (ICD-174.9) - see prev notes  from surg & oncology.  HYPOTHYROIDISM (ICD-244.9) - on SYNTHROID 118mcg/d... ~  labs 3/10 on Synth100 showed TSH= 2.37  DEGENERATIVE JOINT DISEASE (ICD-715.90) - s/p left TKR 1/09 by DrBlackmon... she takes [facility] 10/325 per DrRamos...  also uses VOLTAREN GEL Prn... ~  c/o considerable left knee pain since surg> eval by Susann Givens (he did synovectomy 6/10), DrRamos (he feels poss neuropathic pain & tried nerve block Rx w/ IMIPRAMINE 25mg Bid), and 2nd opinion [facility]Howe at [facility]... ~  she saw DrRamos & Alusio> neuropathic pain w/ trial LYRICA 75mg  1-2 daily; Bone Scan was neg w/o knee uptake. ~  persistant discomfort left knee despite everything & she says "DrNitka knows there is something bad wrong in there but he can't find it"  LOW BACK PAIN, CHRONIC (ICD-724.2) - she has had 2 prev back surgeries and prev CSpine surgery as well...s/p epid steroid shots by DrRamos...  OSTEOPENIA (ICD-733.90) -  on EVISTA 60mg /d, + calcium, vits... ~  labs 3/10 showed Vit D level = 21... rec> Vit D OTC 2000 u daily. ~  labs 3/11 showed Vit D level = 15... rec> take Vit D 2000 u daily. ~  9/11: pt indicates that she is still not taking OTC Vit D supplement- rec to switch to 50,000 u weekly Rx.  TRANSIENT ISCHEMIC ATTACK (ICD-435.9) - on PLAVIX 75mg /d & she stopped ASA on her own... ~  CTBrain 7/08 showed mod atrophy and sm vessel dis...  ~  MRI Brain w/ similar findings & MRA showed mod branch vessel ateriosclerotic changes... ~  MRA neck showed mult stenoses- 30% prox R innominate, 70% prox LICA, 45% prox L subclav, etc.     This was felt to be artifactual? and subseq eval by DrCooper 8/08 showed CDoppler w/ min plaque only, 0-39% bilat.  ANXIETY (ICD-300.00) - she has mult somatic complaints> takes ALPRAZOLAM 0.5mg - 1/2 to 1 Tid as needed. ~  8/12:  She doesn't like the Alpraz, so we switched to LORAZEPAM 1mg  tid prn...   Past Surgical History  Procedure Date  . Total knee arthroplasty 1 2009    left, Dr.  Magnus Ivan  . Nissen fundoplication     and re-do nissen with Gore-Tex ptch 2006 Dr. Daphine Deutscher  . Appendectomy   . Abdominal hysterectomy   . Mastectomy     Outpatient Encounter Prescriptions as of 03/01/2011  Medication Sig Dispense Refill  . chlorpheniramine-HYDROcodone (TUSSIONEX PENNKINETIC ER) 10-8 MG/5ML LQCR Take 5 mLs by mouth every 12 (twelve) hours as needed.        . Cholecalciferol (VITAMIN D3) 50000 UNITS CAPS Take 1 capsule by mouth once a week.        . clopidogrel (PLAVIX) 75 MG tablet Take 75 mg by mouth daily.        Marland Kitchen HYDROcodone-acetaminophen (VICODIN ES) 7.5-750 MG per tablet Take 1 tablet by mouth 2 (two) times daily as needed.        . hyoscyamine (ANASPAZ) 0.125 MG TBDP Place 0.125 mg under the tongue every 4 (four) hours as needed.        Marland Kitchen imipramine (TOFRANIL) 25 MG tablet Take 1 tablet (25 mg total) by mouth 2 (two) times daily.  180 tablet  3  . isosorbide mononitrate (IMDUR) 30 MG 24 hr tablet Take 30 mg by mouth daily.        Marland Kitchen levothyroxine (SYNTHROID, LEVOTHROID) 100 MCG tablet Take 100 mcg by mouth daily.        . sucralfate (CARAFATE) 1 GM/10ML suspension Take 1 tsp by mouth 4 times per day as directed  420 mL  0  . budesonide-formoterol (SYMBICORT) 160-4.5 MCG/ACT inhaler Inhale 2 puffs into the lungs 2 (two) times daily.        . diclofenac sodium (VOLTAREN) 1 % GEL Use as directed  3 Tube  3  . LORazepam (ATIVAN) 1 MG tablet Take 1/2 tablet to 1 tablet three times a day as needed  90 tablet  0  . metoprolol (TOPROL XL) 100 MG 24 hr tablet Take 1 tablet (100 mg total) by mouth daily.  90 tablet  3  . predniSONE (DELTASONE) 5 MG tablet Take 1 tablet (5 mg total) by mouth daily.  30 tablet  5  . raloxifene (EVISTA) 60 MG tablet Take 1 tablet (60 mg total) by mouth daily.  90 tablet  3  . verapamil (CALAN-SR) 240 MG CR tablet Take 1 tablet (240 mg total)  by mouth daily.  90 tablet  3    Allergies  Allergen Reactions  . Codeine     REACTION: itching  .  Pregabalin     REACTION: causes nervousness    Current Medications, Allergies, Past Medical History, Past Surgical History, Family History, and Social History were reviewed in [employer] record.    Review of Systems        See HPI - all other systems neg except as noted... The patient complains of dyspnea on exertion and sl HA.  The patient denies anorexia, fever, weight loss, weight gain, vision loss, decreased hearing, hoarseness, chest pain, syncope, peripheral edema, prolonged cough, headaches, hemoptysis, abdominal pain, melena, hematochezia, severe indigestion/heartburn, hematuria, incontinence, suspicious skin lesions, transient blindness, difficulty walking, depression, unusual weight change, abnormal bleeding, enlarged lymph nodes, and angioedema.     Objective:   Physical Exam     WD, WN, [name] in NAD... GENERAL:  Alert & oriented; pleasant & cooperative... HEENT:  [place]/AT, EOM-wnl, PERRLA, EACs-clear, TMs-wnl, NOSE-clear, THROAT-clear & wnl. NECK:  Supple w/ fairROM; no JVD; normal carotid impulses w/o bruits; no thyromegaly or nodules palpated; no lymphadenopathy. CHEST:  Chest is essent clear now> no rales rhonchi or signs of consolidation... HEART:  Regular Rhythm;  gr 1/6 SEM without rubs or gallops detected... ABDOMEN:  Soft & min epig tender; normal bowel sounds; no organomegaly or masses palpated... EXT: without deformities, mild arthritic changes; no varicose veins/ +venous insuffic/ tr edema. some pain w/ ROM left knee> not red, hot, swollen, etc... NEURO:  CN's intact;  no focal neuro deficits... DERM:  No lesions noted; no rash etc...   Assessment & Plan:   HBP>  BP elev recently & we decid3ed to incr meds: METOPROLOL ER to 100mg /d & VERAPAMIL to 240mg /d; she knows to elim salt & get wt down...  Bronchitis>  Refractory cough etc is improved on regimen> continue Pred 5mg /d for now, Symbicort, Tussionex prn, & anti reflux  regimen...  ASPVD, Hx TIA>  On Plavix w/o ischemic symptoms, continue same...  GERD>  She needs to be diligent about the antireflux regimen... F/u w/ DrPatterson prn.  DJD>  On going Ortho issues are her CC & attended by DrAlusio, DrRamos, etal... She had epid steroid shot 5/12 from DrRamos- helped...  ANXIETY>  Stress from loss of husb & family issues;  We will switch Xanax for LORAZEPAM 1mg  Tid Prn.Marland KitchenMarland Kitchen

## 2011-03-22 ENCOUNTER — Other Ambulatory Visit: Payer: Self-pay | Admitting: Pulmonary Disease

## 2011-03-22 ENCOUNTER — Encounter: Payer: Self-pay | Admitting: Pulmonary Disease

## 2011-03-22 ENCOUNTER — Ambulatory Visit (INDEPENDENT_AMBULATORY_CARE_PROVIDER_SITE_OTHER): Payer: Medicare Other | Admitting: Pulmonary Disease

## 2011-03-22 DIAGNOSIS — M1990 Unspecified osteoarthritis, unspecified site: Secondary | ICD-10-CM

## 2011-03-22 DIAGNOSIS — F411 Generalized anxiety disorder: Secondary | ICD-10-CM

## 2011-03-22 DIAGNOSIS — K219 Gastro-esophageal reflux disease without esophagitis: Secondary | ICD-10-CM

## 2011-03-22 DIAGNOSIS — M949 Disorder of cartilage, unspecified: Secondary | ICD-10-CM

## 2011-03-22 DIAGNOSIS — I10 Essential (primary) hypertension: Secondary | ICD-10-CM

## 2011-03-22 DIAGNOSIS — I872 Venous insufficiency (chronic) (peripheral): Secondary | ICD-10-CM

## 2011-03-22 DIAGNOSIS — I509 Heart failure, unspecified: Secondary | ICD-10-CM

## 2011-03-22 DIAGNOSIS — G459 Transient cerebral ischemic attack, unspecified: Secondary | ICD-10-CM

## 2011-03-22 DIAGNOSIS — I739 Peripheral vascular disease, unspecified: Secondary | ICD-10-CM

## 2011-03-22 DIAGNOSIS — E039 Hypothyroidism, unspecified: Secondary | ICD-10-CM

## 2011-03-22 DIAGNOSIS — K589 Irritable bowel syndrome without diarrhea: Secondary | ICD-10-CM

## 2011-03-22 DIAGNOSIS — M545 Low back pain: Secondary | ICD-10-CM

## 2011-03-22 MED ORDER — LEVOTHYROXINE SODIUM 100 MCG PO TABS: 100.0000 ug | ORAL_TABLET | Freq: Every day | ORAL | Status: DC

## 2011-03-22 MED ORDER — VITAMIN D3 1.25 MG (50000 UT) PO CAPS: 1.0000 | ORAL_CAPSULE | ORAL | Status: DC

## 2011-03-22 MED ORDER — SUCRALFATE 1 GM/10ML PO SUSP: ORAL | Status: DC

## 2011-03-22 NOTE — Progress Notes (Signed)
Subjective:    Patient ID: [name], female    DOB: 10/01/1924, 77 y.o.   MRN: [id number]  HPI [name] here for a follow up visit... she has mult med problems as noted below...   ~  Nov 02, 2010:  6wk ROV & she appears reasonably stable> on the Pred 10mg - 1/2 tab daily (keep same for now), and taking the Alpraz 0.5mg  Bid "it helps my nerves" but still c/o "nervous feeling in my chest- I get hot" & I think it would be worth a trial of HYDROXYZINE 25mg  prn for this...  Her CC is still her knee pain & this gets her focused off her other somatic complaints (still follows w/ DrAlusio & Ramos)...  No apparent resp exac; BP controlled on meds; denies ischemic cerebral or other symptoms; GI appears stable & she requests Carafate refill; etc...  ~  03-17-2011:  5mo ROV & [name] passed away 75mo ago w/ brain mets from his lung cancer, hospice involved & [name] is doing as well as can be expected but feels very alone, daugh busy w/ her family etc;  She notes some incr in BP & assoc HA recently, measures 164/82 today & up to 180/90 at home> she's been on MetoprololER50 & Verap180 plus her Imdur30; we decided to incr to ToprolXL100 & Verap240 plus low salt diet & switch Xanax to Lorazepam1mg  & take more regularly... Plan ROV in 3wks to recheck BP; she had full labs 3/12 (reviewed); she saw DrRamos 5/12 for lumbar epid steroid injection==> some improvement.  ~  March 22, 2011:  3wk ROV & BP improved ~140/80 both here & at home w/ incr in the Metoprolol & Verapamil, tolerated well; still very anxious "I'm a nervous wreck" not resting well & rec to pursue hospice counseling;  She didn't even mention that her daughter got her a new dog & that they are inseparable companions now... Continue current meds.          Problem List:  BRONCHITIS, RECURRENT (ICD-491.9) - prev off Symbicort & stable until 12/11 w/ refractory cough (esp at night) & reflux related symptoms >>> we discussed Depo/ PRED10mg -7d taper,  SYMBICORT160-2spBid, TUSSIONEX Qhs, & antireflux  regimen ==> improved but symptoms ret off the Pred therefore restarted Pred 10mg /d 2/12 w/ slower taper... ~  CXR 3/11 showed sternal fx, old right clavic fx, clear lungs, prev right breast surg. ~  CXR 12/11 showed sternal deformity, & few chr changes, s/p right breast ca surg clips, borderline Cor & Ao calcif, NAD.  HYPERTENSION (ICD-401.9) - on TOPROL XL 100mg /d, VERAPAMIL 240mg /d, & IMDUR 30mg /d... not on a diuretic due to [facility]. ~  2DEcho 7/08 showed LVH and asymmetric septal hypertrophy- on Toprol & Verapamil. ~  3/12:  BP = 150/90 today & denies HA, visual changes, CP, palipit, dizziness, syncope, dyspnea, etc... she notes some fatigue and edema... ~  5/12:  BP= 132/70 & she is unchanged... ~  8/12:  BP elev w/ the stress of husb's passing; we decided to incr the ToprolXL to 100mg /d & the Verapamil to 240mg /d, plus low sodium etc... ~  9/12:  BP improved, continue same...  CONGESTIVE HEART FAILURE (ICD-428.0) - pt was felt to have diast dysfuction by cardiology in 2007. ~  hx atypic CP w/ neg Myoview 5/07 showing no ischemia or infarction & EF=80%. ~  holter monitor w/ PVC's only (eval for palpit and dizzy)  PERIPHERAL VASCULAR DISEASE (ICD-443.9) -  on PLAVIX 75mg /d, she stopped  ASA on her own... Hx of ASPVD with previous TIA- evaluation revealed CT brain with moderate atrophy and small-vessel disease; MRI confirms; MRA showed moderate branch vessel arteriosclerotic changes and MRA of the neck showed several stenoses including 30% stenosis proximal right  innominate artery, 70% stenosis proximal left CCA, 45% prox left subclav, 40% right subclav, & ?12% prox left ICA stenosis. NOTE: some of this was felt to be artifactual by DrCooper on his eval 8/08- CDoppler w/ 0-39% bilat ICAs.   VENOUS INSUFFICIENCY (ICD-459.81) - she's had some edema in the past... control w/ low sodium, elevation, and support hose... avoid diuretics w/ ASH.  GERD  (ICD-530.81) - on NEXIUM 40mg Bid, & CARAFATE 1gm/75ml- taking 1tspQid... ~  hx of "supersensitive esophagus" per DrPatterson w/ hx Nissen Fundoplication in the 90's and subseq re-do Lap Nissen w/ gore-tex patch in 2006 by DrMMartin. ~  prev EGD 7/07 showed HH surg x2, Barrett's esoph, gastritis w/ neg HPylori... ~  extensive eval in Jun09 by DrPatterson w/ recurrent reflux symptoms, CP & dysphagia... EGD w/ severe esophagitis/ gastritis- HPylori neg and Bx neg for eosinophilic esoph... Barium esophogram showed recurrent hernia w/ marked GE reflux... on max acid suppression and referred to DrMMartin for further surgery... he sent her back to DrPatterson w/ another EGD 10/09 showing intact Nissen, ?Barrett's & gastritis... she remains on max meds w/ Nexium/ Carafate. ~  12/11:  Add-on for noct cough & rec for max antireflux regimen> elev HOB 6" blocks, NPO after dinner, etc...  IRRITABLE BOWEL SYNDROME (ICD-564.1) - ? bact overgrowth in the past Rx'd w/ Xifaxan & Align... also taking LEVSIN Prn and IMIPRAMINE Bid... ~  last colonoscopy 3/08 by DrPatterson showed divertics only...  Hx of UTI (ICD-599.0)  Hx of ADENOCARCINOMA, BREAST (ICD-174.9) - see prev notes from surg & oncology.  HYPOTHYROIDISM (ICD-244.9) - on SYNTHROID 151mcg/d... ~  labs 3/10 on Synth100 showed TSH= 2.37  DEGENERATIVE JOINT DISEASE (ICD-715.90) - s/p left TKR 1/09 by DrBlackmon... she takes [facility] 10/325 per DrRamos...  also uses VOLTAREN GEL Prn... ~  c/o considerable left knee pain since surg> eval by Susann Givens (he did synovectomy 6/10), DrRamos (he feels poss neuropathic pain & tried nerve block Rx w/ IMIPRAMINE 25mg Bid), and 2nd opinion DrHowe at [facility]... ~  she saw DrRamos & Alusio> neuropathic pain w/ trial LYRICA 75mg  1-2 daily; Bone Scan was neg w/o knee uptake. ~  persistant discomfort left knee despite everything & she says "DrNitka knows there is something bad wrong in there but he can't find it"  LOW BACK PAIN,  CHRONIC (ICD-724.2) - she has had 2 prev back surgeries and prev CSpine surgery as well...s/p epid steroid shots by DrRamos...  OSTEOPENIA (ICD-733.90) - on EVISTA 60mg /d, + calcium, vits... ~  labs 3/10 showed Vit D level = 21... rec> Vit D OTC 2000 u daily. ~  labs 3/11 showed Vit D level = 15... rec> take Vit D 2000 u daily. ~  9/11: pt indicates that she is still not taking OTC Vit D supplement- rec to switch to 50,000 u weekly Rx.  TRANSIENT ISCHEMIC ATTACK (ICD-435.9) - on PLAVIX 75mg /d & she stopped ASA on her own... ~  CTBrain 7/08 showed mod atrophy and sm vessel dis...  ~  MRI Brain w/ similar findings & MRA showed mod branch vessel ateriosclerotic changes... ~  MRA neck showed mult stenoses- 30% prox R innominate, 70% prox LICA, 45% prox L subclav, etc.     This was felt to be artifactual? and subseq  eval by DrCooper 8/08 showed CDoppler w/ min plaque only, 0-39% bilat.  ANXIETY (ICD-300.00) - she has mult somatic complaints> takes ALPRAZOLAM 0.5mg - 1/2 to 1 Tid as needed. ~  8/12:  She doesn't like the Alpraz, so we switched to LORAZEPAM 1mg  tid prn...   Past Surgical History  Procedure Date  . Total knee arthroplasty 1 2009    left, Dr. Magnus Ivan  . Nissen fundoplication     and re-do nissen with Gore-Tex ptch 2006 Dr. Daphine Deutscher  . Appendectomy   . Abdominal hysterectomy   . Mastectomy     Outpatient Encounter Prescriptions as of 03/22/2011  Medication Sig Dispense Refill  . budesonide-formoterol (SYMBICORT) 160-4.5 MCG/ACT inhaler Inhale 2 puffs into the lungs 2 (two) times daily.        . chlorpheniramine-HYDROcodone (TUSSIONEX PENNKINETIC ER) 10-8 MG/5ML LQCR Take 5 mLs by mouth every 12 (twelve) hours as needed.        . Cholecalciferol (VITAMIN D3) 50000 UNITS CAPS Take 1 capsule by mouth once a week.  4 capsule  11  . clopidogrel (PLAVIX) 75 MG tablet Take 75 mg by mouth daily.        . diclofenac sodium (VOLTAREN) 1 % GEL Use as directed  3 Tube  3  .  HYDROcodone-acetaminophen (VICODIN ES) 7.5-750 MG per tablet Take 1 tablet by mouth 2 (two) times daily as needed.        . hyoscyamine (ANASPAZ) 0.125 MG TBDP Place 0.125 mg under the tongue every 4 (four) hours as needed.        Marland Kitchen imipramine (TOFRANIL) 25 MG tablet Take 1 tablet (25 mg total) by mouth 2 (two) times daily.  180 tablet  3  . isosorbide mononitrate (IMDUR) 30 MG 24 hr tablet Take 30 mg by mouth daily.        Marland Kitchen levothyroxine (SYNTHROID, LEVOTHROID) 100 MCG tablet Take 1 tablet (100 mcg total) by mouth daily.  90 tablet  3  . LORazepam (ATIVAN) 1 MG tablet Take 1/2 tablet to 1 tablet three times a day as needed  90 tablet  0  . metoprolol (TOPROL XL) 100 MG 24 hr tablet Take 1 tablet (100 mg total) by mouth daily.  90 tablet  3  . predniSONE (DELTASONE) 5 MG tablet Take 1 tablet (5 mg total) by mouth daily.  30 tablet  5  . raloxifene (EVISTA) 60 MG tablet Take 1 tablet (60 mg total) by mouth daily.  90 tablet  3  . sucralfate (CARAFATE) 1 GM/10ML suspension Take 1 tsp by mouth 4 times per day as directed  1800 mL  3  . verapamil (CALAN-SR) 240 MG CR tablet Take 1 tablet (240 mg total) by mouth daily.  90 tablet  3    Allergies  Allergen Reactions  . Codeine     REACTION: itching  . Pregabalin     REACTION: causes nervousness    Current Medications, Allergies, Past Medical History, Past Surgical History, Family History, and Social History were reviewed in [employer] record.    Review of Systems        See HPI - all other systems neg except as noted... The patient complains of dyspnea on exertion and sl HA.  The patient denies anorexia, fever, weight loss, weight gain, vision loss, decreased hearing, hoarseness, chest pain, syncope, peripheral edema, prolonged cough, headaches, hemoptysis, abdominal pain, melena, hematochezia, severe indigestion/heartburn, hematuria, incontinence, suspicious skin lesions, transient blindness, difficulty walking,  depression, unusual weight change,  abnormal bleeding, enlarged lymph nodes, and angioedema.     Objective:   Physical Exam     WD, WN, [name] in NAD... GENERAL:  Alert & oriented; pleasant & cooperative... HEENT:  [place]/AT, EOM-wnl, PERRLA, EACs-clear, TMs-wnl, NOSE-clear, THROAT-clear & wnl. NECK:  Supple w/ fairROM; no JVD; normal carotid impulses w/o bruits; no thyromegaly or nodules palpated; no lymphadenopathy. CHEST:  Chest is essent clear now> no rales rhonchi or signs of consolidation... HEART:  Regular Rhythm;  gr 1/6 SEM without rubs or gallops detected... ABDOMEN:  Soft & min epig tender; normal bowel sounds; no organomegaly or masses palpated... EXT: without deformities, mild arthritic changes; no varicose veins/ +venous insuffic/ tr edema. some pain w/ ROM left knee> not red, hot, swollen, etc... NEURO:  CN's intact;  no focal neuro deficits... DERM:  No lesions noted; no rash etc...   Assessment & Plan:   HBP>  BP improved w/ METOPROLOL ER 100mg /d & VERAPAMIL 240mg /d; she knows to elim salt & get wt down...  Bronchitis>  Refractory cough etc is improved on regimen> continue Pred 5mg /d for now, Symbicort, Tussionex prn, & anti reflux regimen...  ASPVD, Hx TIA>  On Plavix w/o ischemic symptoms, continue same...  GERD>  She needs to be diligent about the antireflux regimen... F/u w/ DrPatterson prn.  DJD>  On going Ortho issues are her CC & attended by DrAlusio, DrRamos, etal... She had epid steroid shot 5/12 from DrRamos- helped...  ANXIETY>  Stress from loss of husb & family issues;  We will switch Xanax for LORAZEPAM 1mg  Tid Prn.Marland KitchenMarland Kitchen

## 2011-03-22 NOTE — Patient Instructions (Signed)
Today we updated your med list in EPIC...'    Continue your current meds the same as your BP is improved...    Continue the Lorazepam 1mg  three times daily for your anxiety...  We will help to set up the hospice counseling services you need to help w/ your grief...  Call for any questions...  Let's plan a follow up visit in 4 months.Marland KitchenMarland Kitchen

## 2011-03-25 LAB — CBC
HCT: 13.1 — ABNORMAL LOW
HCT: 47.2 — ABNORMAL HIGH
Hemoglobin: 11.1 — ABNORMAL LOW
Hemoglobin: 9.6 — ABNORMAL LOW
MCHC: 33
MCHC: 33.6
MCHC: 34.3
MCHC: 34.7
MCV: 98
MCV: 98.2
MCV: 98.3
MCV: 99.7
Platelets: 123 — ABNORMAL LOW
Platelets: 149 — ABNORMAL LOW
Platelets: 56 — ABNORMAL LOW
RBC: 4.8
RDW: 14.3
RDW: 14.3
RDW: 14.5
RDW: 15.7 — ABNORMAL HIGH
WBC: 3.9 — ABNORMAL LOW
WBC: 8.6
WBC: 9.9

## 2011-03-25 LAB — CROSSMATCH
ABO/RH(D): A NEG
Antibody Screen: NEGATIVE

## 2011-03-25 LAB — BASIC METABOLIC PANEL
BUN: 6
BUN: 7
BUN: 8
CO2: 24
CO2: 27
CO2: 29
Calcium: 8.2 — ABNORMAL LOW
Calcium: 8.5
Chloride: 102
Chloride: 104
Chloride: 104
Creatinine, Ser: 0.57
Creatinine, Ser: 0.68
Creatinine, Ser: 0.68
Creatinine, Ser: 0.9
GFR calc Af Amer: >60
GFR calc Af Amer: >60
GFR calc non Af Amer: >60
Glucose, Bld: 118 — ABNORMAL HIGH
Glucose, Bld: 121 — ABNORMAL HIGH
Glucose, Bld: 135 — ABNORMAL HIGH
Potassium: 3.4 — ABNORMAL LOW
Potassium: 4.2
Sodium: 132 — ABNORMAL LOW
Sodium: 134 — ABNORMAL LOW

## 2011-03-25 LAB — PROTIME-INR
INR: 1.2
INR: 1.5
Prothrombin Time: 15.7 — ABNORMAL HIGH
Prothrombin Time: 16.3 — ABNORMAL HIGH
Prothrombin Time: 18.3 — ABNORMAL HIGH
Prothrombin Time: 19.4 — ABNORMAL HIGH
Prothrombin Time: 27.7 — ABNORMAL HIGH

## 2011-03-25 LAB — HEMOGLOBIN AND HEMATOCRIT, BLOOD
HCT: 34.3 — ABNORMAL LOW
Hemoglobin: 11.5 — ABNORMAL LOW

## 2011-03-25 LAB — ABO/RH: ABO/RH(D): A NEG

## 2011-04-04 ENCOUNTER — Encounter: Payer: Self-pay | Admitting: Pulmonary Disease

## 2011-04-04 LAB — BASIC METABOLIC PANEL
BUN: 8
CO2: 33 — ABNORMAL HIGH
Chloride: 108
Creatinine, Ser: 0.84
Glucose, Bld: 81
Potassium: 4

## 2011-04-04 LAB — HEMOGLOBIN AND HEMATOCRIT, BLOOD: Hemoglobin: 15.4 — ABNORMAL HIGH

## 2011-04-12 ENCOUNTER — Other Ambulatory Visit: Payer: Self-pay | Admitting: Gastroenterology

## 2011-04-19 LAB — URINALYSIS, ROUTINE W REFLEX MICROSCOPIC
Bilirubin Urine: NEGATIVE
Glucose, UA: NEGATIVE
Ketones, ur: NEGATIVE
Protein, ur: NEGATIVE
pH: 7.5

## 2011-04-19 LAB — URINE MICROSCOPIC-ADD ON

## 2011-04-19 LAB — DIFFERENTIAL
Basophils Relative: 0
Eosinophils Absolute: 0.1
Eosinophils Relative: 1
Lymphs Abs: 3.6 — ABNORMAL HIGH
Monocytes Relative: 7

## 2011-04-19 LAB — BASIC METABOLIC PANEL
BUN: 8
CO2: 26
Calcium: 9.3
Creatinine, Ser: 0.69
GFR calc non Af Amer: >60
Glucose, Bld: 102 — ABNORMAL HIGH

## 2011-04-19 LAB — CARDIAC PANEL(CRET KIN+CKTOT+MB+TROPI)
CK, MB: 1
Relative Index: INVALID
Total CK: 37

## 2011-04-19 LAB — CBC
Hemoglobin: 16.4 — ABNORMAL HIGH
MCHC: 33.5
RBC: 5.1
RDW: 15.5 — ABNORMAL HIGH

## 2011-04-19 LAB — COMPREHENSIVE METABOLIC PANEL
ALT: 51 — ABNORMAL HIGH
AST: 28
Alkaline Phosphatase: 52
CO2: 26
Calcium: 9.3
GFR calc Af Amer: >60
Glucose, Bld: 163 — ABNORMAL HIGH
Potassium: 4.2
Sodium: 136
Total Protein: 5.7 — ABNORMAL LOW

## 2011-04-19 LAB — URINE CULTURE
Colony Count: 80000
Special Requests: NEGATIVE

## 2011-04-19 LAB — B-NATRIURETIC PEPTIDE (CONVERTED LAB): Pro B Natriuretic peptide (BNP): 49

## 2011-05-12 ENCOUNTER — Telehealth: Payer: Self-pay | Admitting: Pulmonary Disease

## 2011-05-12 MED ORDER — LORAZEPAM 1 MG PO TABS: ORAL_TABLET | ORAL | Status: DC

## 2011-05-12 NOTE — Telephone Encounter (Signed)
rx refill for lorazepam has been called to the pts pharmacy.  Called and spoke with pt and she is aware.

## 2011-07-04 ENCOUNTER — Telehealth: Payer: Self-pay | Admitting: Pulmonary Disease

## 2011-07-04 MED ORDER — GUAIFENESIN ER 600 MG PO TB12: 600.0000 mg | ORAL_TABLET | Freq: Two times a day (BID) | ORAL | Status: DC

## 2011-07-04 MED ORDER — PREDNISONE (PAK) 5 MG PO TABS: ORAL_TABLET | ORAL | Status: DC

## 2011-07-04 MED ORDER — AMOXICILLIN-POT CLAVULANATE 875-125 MG PO TABS: 1.0000 | ORAL_TABLET | Freq: Two times a day (BID) | ORAL | Status: AC

## 2011-07-04 NOTE — Telephone Encounter (Signed)
Per SN---ok for augmentin 875mg   #14  1 po bid until gone, pred dosepak  5mg    6 day pack as directed, fluids, mucinex otc 2 po bid, tylenol as needed.  thanks

## 2011-07-04 NOTE — Telephone Encounter (Signed)
I spoke with pt and she c/o cough w/ grey phlem, sore throat, fever of 102 (started last night), nasal congestion, PND, headache x 2 days. Pt denies any body aches, nausea, vomiting. Pt is requesting to have something called in for her. Please advise Dr. Kriste Basque, thanks  Allergies  Allergen Reactions  . Codeine     REACTION: itching  . Pregabalin     REACTION: causes nervousness

## 2011-07-04 NOTE — Telephone Encounter (Signed)
Called spoke with patient, advised of SN's recs.  Pt verbalized her understanding.  rx's sent to verified pharmacy.  Pt asked if she could take some hydromet that prescribed to her husband, for which I advised not to.  Pt okay with this.  Advised to take delsym and the plain mucinex for her cough with plenty of fluids.  Pt verbalized her understanding.  rx called into her verified pharmacy with the request to add the plain mucinex for her.

## 2011-07-05 DIAGNOSIS — I639 Cerebral infarction, unspecified: Secondary | ICD-10-CM

## 2011-07-05 HISTORY — DX: Cerebral infarction, unspecified: I63.9

## 2011-07-20 DIAGNOSIS — M7530 Calcific tendinitis of unspecified shoulder: Secondary | ICD-10-CM | POA: Diagnosis not present

## 2011-07-26 ENCOUNTER — Encounter: Payer: Self-pay | Admitting: Pulmonary Disease

## 2011-07-26 ENCOUNTER — Ambulatory Visit (INDEPENDENT_AMBULATORY_CARE_PROVIDER_SITE_OTHER): Payer: Medicare Other | Admitting: Pulmonary Disease

## 2011-07-26 DIAGNOSIS — Z23 Encounter for immunization: Secondary | ICD-10-CM

## 2011-07-26 DIAGNOSIS — M1990 Unspecified osteoarthritis, unspecified site: Secondary | ICD-10-CM

## 2011-07-26 DIAGNOSIS — M899 Disorder of bone, unspecified: Secondary | ICD-10-CM

## 2011-07-26 DIAGNOSIS — I10 Essential (primary) hypertension: Secondary | ICD-10-CM

## 2011-07-26 DIAGNOSIS — K219 Gastro-esophageal reflux disease without esophagitis: Secondary | ICD-10-CM

## 2011-07-26 DIAGNOSIS — E039 Hypothyroidism, unspecified: Secondary | ICD-10-CM

## 2011-07-26 DIAGNOSIS — I509 Heart failure, unspecified: Secondary | ICD-10-CM

## 2011-07-26 DIAGNOSIS — M545 Low back pain, unspecified: Secondary | ICD-10-CM

## 2011-07-26 DIAGNOSIS — K589 Irritable bowel syndrome without diarrhea: Secondary | ICD-10-CM

## 2011-07-26 DIAGNOSIS — C50919 Malignant neoplasm of unspecified site of unspecified female breast: Secondary | ICD-10-CM

## 2011-07-26 DIAGNOSIS — I872 Venous insufficiency (chronic) (peripheral): Secondary | ICD-10-CM

## 2011-07-26 DIAGNOSIS — I739 Peripheral vascular disease, unspecified: Secondary | ICD-10-CM

## 2011-07-26 DIAGNOSIS — F411 Generalized anxiety disorder: Secondary | ICD-10-CM

## 2011-07-26 DIAGNOSIS — J42 Unspecified chronic bronchitis: Secondary | ICD-10-CM | POA: Diagnosis not present

## 2011-07-26 MED ORDER — IMIPRAMINE HCL 25 MG PO TABS: 25.0000 mg | ORAL_TABLET | Freq: Two times a day (BID) | ORAL | Status: DC

## 2011-07-26 MED ORDER — LORAZEPAM 1 MG PO TABS: ORAL_TABLET | ORAL | Status: DC

## 2011-07-26 MED ORDER — SUCRALFATE 1 GM/10ML PO SUSP: ORAL | Status: DC

## 2011-07-26 MED ORDER — DICLOFENAC SODIUM 75 MG PO TBEC: 75.0000 mg | DELAYED_RELEASE_TABLET | Freq: Two times a day (BID) | ORAL | Status: DC

## 2011-07-26 MED ORDER — CLOPIDOGREL BISULFATE 75 MG PO TABS: 75.0000 mg | ORAL_TABLET | Freq: Every day | ORAL | Status: DC

## 2011-07-26 NOTE — Patient Instructions (Signed)
Today we updated your med list in our EPIC system...    Continue your current medications the same...    We wrote a new prescription for Voltaren 75mg  take 1 tab twice daily as needed for arthritis pain...  Let's plan a follow up visit in about 4 months w/ FASTING blood work at that time.Marland KitchenMarland Kitchen

## 2011-07-26 NOTE — Progress Notes (Signed)
Subjective:    Patient ID: [name], female    DOB: 03-03-1925, 77 y.o.   MRN: [id number]  HPI 78 y/o WF here for a follow up visit... she has mult med problems as noted below...   ~  Nov 02, 2010:  6wk ROV & she appears reasonably stable> on the Pred 10mg - 1/2 tab daily (keep same for now), and taking the Alpraz 0.5mg  Bid "it helps my nerves" but still c/o "nervous feeling in my chest- I get hot" & I think it would be worth a trial of HYDROXYZINE 25mg  prn for this...  Her CC is still her knee pain & this gets her focused off her other somatic complaints (still follows w/ DrAlusio & Ramos)...  No apparent resp exac; BP controlled on meds; denies ischemic cerebral or other symptoms; GI appears stable & she requests Carafate refill; etc...  ~  Mar 21, 2011:  46mo ROV & [name] passed away 76mo ago w/ brain mets from his lung cancer, hospice involved & [name] is doing as well as can be expected but feels very alone, daugh busy w/ her family etc;  She notes some incr in BP & assoc HA recently, measures 164/82 today & up to 180/90 at home> she's been on MetoprololER50 & Verap180 plus her Imdur30; we decided to incr to ToprolXL100 & Verap240 plus low salt diet & switch Xanax to Lorazepam1mg  & take more regularly... Plan ROV in 3wks to recheck BP; she had full labs 3/12 (reviewed); she saw DrRamos 5/12 for lumbar epid steroid injection==> some improvement.  ~  March 22, 2011:  3wk ROV & BP improved ~140/80 both here & at home w/ incr in the Metoprolol & Verapamil, tolerated well; still very anxious "I'm a nervous wreck" not resting well & rec to pursue hospice counseling;  She didn't even mention that her daughter got her a new dog & that they are inseparable companions now... Continue current meds.  ~  July 26, 2011:  46mo ROV & [name] is getting over a URI w/ "head cold" symptoms, hasn't yet had the 2012 Flu vaccine & we gave it today- reminded to get this early in the season next yr... Her CC  remains her left knee discomfort & she saw DrNitka 11/12> she needs a TKR revision but she is against any additional surg;  She did get an injection in her left shoulder w/ some improvement there...     BP remains well controlled on meds & she has no edema; she remains on Plavix & has not had any cerebral ischemic symptoms; her nerves are better on the Lorazepam Rx...          Problem List:  BRONCHITIS, RECURRENT (ICD-491.9) - prev off Symbicort & stable until 12/11 w/ refractory cough (esp at night) & reflux related symptoms >> we discussed Depo/ PRED10mg -7d taper, SYMBICORT160-2spBid, TUSSIONEX Qhs, & antireflux  regimen ==> improved but symptoms ret off the Pred therefore restarted Pred 10mg /d 2/12 w/ slower taper... ~  CXR 3/11 showed sternal fx, old right clavic fx, clear lungs, prev right breast surg. ~  CXR 12/11 showed sternal deformity, & few chr changes, s/p right breast ca surg clips, borderline Cor & Ao calcif, NAD.  HYPERTENSION (ICD-401.9) - on TOPROL XL 100mg /d, VERAPAMIL 240mg /d, & IMDUR 30mg /d... not on a diuretic due to [facility]. ~  2DEcho 7/08 showed LVH and asymmetric septal hypertrophy- on Toprol & Verapamil. ~  3/12:  BP = 150/90 today & denies HA, visual changes, CP,  palipit, dizziness, syncope, dyspnea, etc... she notes some fatigue and edema... ~  5/12:  BP= 132/70 & she is unchanged... ~  8/12:  BP elev w/ the stress of husb's passing; we decided to incr the ToprolXL to 100mg /d & the Verapamil to 240mg /d, plus low sodium etc... ~  1/13:  BP= 134/86 & she denies CP, palpit, SOB, edema> continue same...  CONGESTIVE HEART FAILURE (ICD-428.0) - pt was felt to have diast dysfuction by cardiology in 2007. ~  hx atypic CP w/ neg Myoview 5/07 showing no ischemia or infarction & EF=80%. ~  holter monitor w/ PVC's only (eval for palpit and dizzy)  PERIPHERAL VASCULAR DISEASE (ICD-443.9) -  on PLAVIX 75mg /d, she stopped ASA on her own... Hx of ASPVD with previous TIA- evaluation  revealed CT brain with moderate atrophy and small-vessel disease; MRI confirms; MRA showed moderate branch vessel arteriosclerotic changes and MRA of the neck showed several stenoses including 30% stenosis proximal right  innominate artery, 70% stenosis proximal left CCA, 45% prox left subclav, 40% right subclav, & ?12% prox left ICA stenosis. NOTE: some of this was felt to be artifactual by DrCooper on his eval 8/08- CDoppler w/ 0-39% bilat ICAs.   VENOUS INSUFFICIENCY (ICD-459.81) - she's had some edema in the past... control w/ low sodium, elevation, and support hose... avoid diuretics w/ ASH.  GERD (ICD-530.81) - on CARAFATE 1gm/65ml- taking 1tspQid & off prev Nexium (was on 40mg  Bid). ~  hx of "supersensitive esophagus" per DrPatterson w/ hx Nissen Fundoplication in the 90's and subseq re-do Lap Nissen w/ gore-tex patch in 2006 by DrMMartin. ~  prev EGD 7/07 showed HH surg x2, Barrett's esoph, gastritis w/ neg HPylori... ~  extensive eval in Jun09 by DrPatterson w/ recurrent reflux symptoms, CP & dysphagia... EGD w/ severe esophagitis/ gastritis- HPylori neg and Bx neg for eosinophilic esoph... Barium esophogram showed recurrent hernia w/ marked GE reflux... on max acid suppression and referred to DrMMartin for further surgery... he sent her back to DrPatterson w/ another EGD 10/09 showing intact Nissen, ?Barrett's & gastritis... she remains on max meds w/ Nexium/ Carafate. ~  12/11:  Add-on for noct cough & rec for max antireflux regimen> elev HOB 6" blocks, NPO after dinner, etc...  IRRITABLE BOWEL SYNDROME (ICD-564.1) - ? bact overgrowth in the past Rx'd w/ Xifaxan & Align... also taking LEVSIN Prn and IMIPRAMINE Bid... ~  last colonoscopy 3/08 by DrPatterson showed divertics only...  Hx of UTI (ICD-599.0)  Hx of ADENOCARCINOMA, BREAST (ICD-174.9) - see prev notes from surg & oncology.  HYPOTHYROIDISM (ICD-244.9) - on SYNTHROID 133mcg/d... ~  labs 3/10 on Synth100 showed TSH= 2.37 ~  Labs  3/12 showed TSH= 5.28... Reminded to take med every day!  DEGENERATIVE JOINT DISEASE (ICD-715.90) - s/p left TKR 1/09 by DrBlackmon... she takes VICODIN ES 7.5/750 per DrRamos...  also uses VOLTAREN GEL Prn... ~  c/o considerable left knee pain since surg> eval by Susann Givens (he did synovectomy 6/10), DrRamos (he feels poss neuropathic pain & tried nerve block Rx w/ IMIPRAMINE 25mg Bid), and [facility]nd opinion [facility]... ~  she saw DrRamos & Alusio> neuropathic pain w/ trial LYRICA 75mg  1-2 daily; Bone Scan was neg w/o knee uptake. ~  persistant discomfort left knee despite everything & she says "Susann Givens knows there is something bad wrong in there but he can't find it"> he has rec TKR revision but she is against more surg... ~  1/13:  She requests anti-inflamm arthritis med, she likes the Voltaren gel,  therefore Rx for DICLOFENAC 75mg  Bid prn...  LOW BACK PAIN, CHRONIC (ICD-724.2) - she has had 2 prev back surgeries and prev CSpine surgery as well...s/p epid steroid shots by DrRamos...  OSTEOPENIA (ICD-733.90) - on EVISTA 60mg /d, + calcium, vits... ~  labs 3/10 showed Vit D level = 21... rec> Vit D OTC 2000 u daily. ~  labs 3/11 showed Vit D level = 15... rec> take Vit D 2000 u daily. ~  9/11: pt indicates that she is still not taking OTC Vit D supplement- rec to switch to 50,000 u weekly Rx.  TRANSIENT ISCHEMIC ATTACK (ICD-435.9) - on PLAVIX 75mg /d & she stopped ASA on her own... ~  CTBrain 7/08 showed mod atrophy and sm vessel dis...  ~  MRI Brain w/ similar findings & MRA showed mod branch vessel ateriosclerotic changes... ~  MRA neck showed mult stenoses- 30% prox R innominate, 70% prox LICA, 45% prox L subclav, etc.     This was felt to be artifactual? and subseq eval by DrCooper 8/08 showed CDoppler w/ min plaque only, 0-39% bilat.  ANXIETY (ICD-300.00) - she has mult somatic complaints> prev on Alprazolam 0.5 prn... ~  8/12:  She doesn't like the Alpraz, so we switched to LORAZEPAM 1mg  tid  prn...   Past Surgical History  Procedure Date  . Total knee arthroplasty 1 2009    left, Dr. Magnus Ivan  . Nissen fundoplication     and re-do nissen with Gore-Tex ptch 2006 Dr. Daphine Deutscher  . Appendectomy   . Abdominal hysterectomy   . Mastectomy     Outpatient Encounter Prescriptions as of 07/26/2011  Medication Sig Dispense Refill  . Cholecalciferol (VITAMIN D3) 50000 UNITS CAPS Take 1 capsule by mouth once a week.  4 capsule  11  . clopidogrel (PLAVIX) 75 MG tablet Take 75 mg by mouth daily.        . diclofenac sodium (VOLTAREN) 1 % GEL Use as directed  3 Tube  3  . HYDROcodone-acetaminophen (VICODIN ES) 7.5-750 MG per tablet Take 1 tablet by mouth 2 (two) times daily as needed.        Marland Kitchen imipramine (TOFRANIL) 25 MG tablet Take 1 tablet (25 mg total) by mouth 2 (two) times daily.  180 tablet  3  . levothyroxine (SYNTHROID, LEVOTHROID) 100 MCG tablet Take 1 tablet (100 mcg total) by mouth daily.  90 tablet  3  . LORazepam (ATIVAN) 1 MG tablet Take 1/2 tablet to 1 tablet three times a day as needed  90 tablet  1  . metoprolol (TOPROL XL) 100 MG 24 hr tablet Take 1 tablet (100 mg total) by mouth daily.  90 tablet  3  . sucralfate (CARAFATE) 1 GM/10ML suspension Take 1 tsp by mouth 4 times per day as directed  1800 mL  3  . verapamil (CALAN-SR) 240 MG CR tablet Take 1 tablet (240 mg total) by mouth daily.  90 tablet  3  . budesonide-formoterol (SYMBICORT) 160-4.5 MCG/ACT inhaler Inhale 2 puffs into the lungs 2 (two) times daily.        Marland Kitchen guaiFENesin (MUCINEX) 600 MG 12 hr tablet Take 1 tablet (600 mg total) by mouth 2 (two) times daily.      . isosorbide mononitrate (IMDUR) 30 MG 24 hr tablet Take 30 mg by mouth daily.        Marland Kitchen DISCONTD: chlorpheniramine-HYDROcodone (TUSSIONEX PENNKINETIC ER) 10-8 MG/5ML LQCR Take 5 mLs by mouth every 12 (twelve) hours as needed.        Marland Kitchen  DISCONTD: hyoscyamine (ANASPAZ) 0.125 MG TBDP Place 0.125 mg under the tongue every 4 (four) hours as needed.        Marland Kitchen  DISCONTD: predniSONE (DELTASONE) 5 MG tablet Take 1 tablet (5 mg total) by mouth daily.  30 tablet  5  . DISCONTD: predniSONE (STERAPRED UNI-PAK) 5 MG TABS Take as directed.  21 tablet  0  . DISCONTD: raloxifene (EVISTA) 60 MG tablet Take 1 tablet (60 mg total) by mouth daily.  90 tablet  3    Allergies  Allergen Reactions  . Codeine     REACTION: itching  . Pregabalin     REACTION: causes nervousness    Current Medications, Allergies, Past Medical History, Past Surgical History, Family History, and Social History were reviewed in [employer] record.    Review of Systems        See HPI - all other systems neg except as noted... The patient complains of dyspnea on exertion and sl HA.  The patient denies anorexia, fever, weight loss, weight gain, vision loss, decreased hearing, hoarseness, chest pain, syncope, peripheral edema, prolonged cough, headaches, hemoptysis, abdominal pain, melena, hematochezia, severe indigestion/heartburn, hematuria, incontinence, suspicious skin lesions, transient blindness, difficulty walking, depression, unusual weight change, abnormal bleeding, enlarged lymph nodes, and angioedema.     Objective:   Physical Exam     WD, WN, 78 y/o WF in NAD... GENERAL:  Alert & oriented; pleasant & cooperative... HEENT:  [place]/AT, EOM-wnl, PERRLA, EACs-clear, TMs-wnl, NOSE-clear, THROAT-clear & wnl. NECK:  Supple w/ fairROM; no JVD; normal carotid impulses w/o bruits; no thyromegaly or nodules palpated; no lymphadenopathy. CHEST:  Chest is essent clear now> no rales rhonchi or signs of consolidation... HEART:  Regular Rhythm;  gr 1/6 SEM without rubs or gallops detected... ABDOMEN:  Soft & min epig tender; normal bowel sounds; no organomegaly or masses palpated... EXT: without deformities, mild arthritic changes; no varicose veins/ +venous insuffic/ tr edema. some pain w/ ROM left knee> not red, hot, swollen, etc... NEURO:  CN's intact;  no focal  neuro deficits... DERM:  No lesions noted; no rash etc...  RADIOLOGY DATA:  Reviewed in the EPIC EMR & discussed w/ the patient...  LABORATORY DATA:  Reviewed in the EPIC EMR & discussed w/ the patient...   Assessment & Plan:   HBP>  BP improved w/ METOPROLOL ER 100mg /d & VERAPAMIL 240mg /d; she knows to elim salt & get wt down...  Bronchitis>  Refractory cough has resolved & she stopped the Pred, Symbicort, Tussionex, etc; advised to continue the antireflux regimen etc...  ASPVD, Hx TIA>  On Plavix w/o ischemic symptoms, continue same...  GERD>  She needs to be diligent about the antireflux regimen... F/u w/ DrPatterson prn.  DJD>  On going Ortho issues are her CC & attended by DrAlusio, DrRamos, etal... She had epid steroid shot 5/12 from DrRamos- helped, and a shot in left shoulder...  ANXIETY>  Stress from loss of husb & family issues;  Stable on LORAZEPAM 1mg  Tid Prn.Marland KitchenMarland Kitchen

## 2011-11-02 DIAGNOSIS — H04129 Dry eye syndrome of unspecified lacrimal gland: Secondary | ICD-10-CM | POA: Diagnosis not present

## 2011-11-02 DIAGNOSIS — H4011X0 Primary open-angle glaucoma, stage unspecified: Secondary | ICD-10-CM | POA: Diagnosis not present

## 2011-11-02 DIAGNOSIS — H409 Unspecified glaucoma: Secondary | ICD-10-CM | POA: Diagnosis not present

## 2011-11-08 DIAGNOSIS — M545 Low back pain: Secondary | ICD-10-CM | POA: Diagnosis not present

## 2011-11-21 ENCOUNTER — Telehealth: Payer: Self-pay | Admitting: Pulmonary Disease

## 2011-11-21 DIAGNOSIS — M545 Low back pain: Secondary | ICD-10-CM

## 2011-11-21 DIAGNOSIS — E039 Hypothyroidism, unspecified: Secondary | ICD-10-CM

## 2011-11-21 DIAGNOSIS — Z0000 Encounter for general adult medical examination without abnormal findings: Secondary | ICD-10-CM

## 2011-11-21 DIAGNOSIS — M949 Disorder of cartilage, unspecified: Secondary | ICD-10-CM

## 2011-11-21 DIAGNOSIS — I10 Essential (primary) hypertension: Secondary | ICD-10-CM

## 2011-11-21 DIAGNOSIS — E7800 Pure hypercholesterolemia, unspecified: Secondary | ICD-10-CM

## 2011-11-21 DIAGNOSIS — K219 Gastro-esophageal reflux disease without esophagitis: Secondary | ICD-10-CM

## 2011-11-21 DIAGNOSIS — Z13220 Encounter for screening for lipoid disorders: Secondary | ICD-10-CM

## 2011-11-21 NOTE — Telephone Encounter (Signed)
Please advise on what labs to order for pt appt on 11-23-11. Carron Curie, CMA

## 2011-11-21 NOTE — Telephone Encounter (Signed)
Last seen by SN 07/2011, told to follow up in 4 months with fasting blood work.  Last labs were done 09/2010.  Ok for pt to have: TSH, BMET, LIPID, CBCD, Vit D, Hepatic, UA.  Orders have been placed.  LMOM TCB x1 to inform pt.

## 2011-11-22 ENCOUNTER — Other Ambulatory Visit (INDEPENDENT_AMBULATORY_CARE_PROVIDER_SITE_OTHER): Payer: Medicare Other

## 2011-11-22 DIAGNOSIS — Z13220 Encounter for screening for lipoid disorders: Secondary | ICD-10-CM | POA: Diagnosis not present

## 2011-11-22 DIAGNOSIS — E039 Hypothyroidism, unspecified: Secondary | ICD-10-CM

## 2011-11-22 DIAGNOSIS — K219 Gastro-esophageal reflux disease without esophagitis: Secondary | ICD-10-CM | POA: Diagnosis not present

## 2011-11-22 DIAGNOSIS — E7800 Pure hypercholesterolemia, unspecified: Secondary | ICD-10-CM

## 2011-11-22 DIAGNOSIS — M899 Disorder of bone, unspecified: Secondary | ICD-10-CM | POA: Diagnosis not present

## 2011-11-22 DIAGNOSIS — I10 Essential (primary) hypertension: Secondary | ICD-10-CM | POA: Diagnosis not present

## 2011-11-22 DIAGNOSIS — M545 Low back pain: Secondary | ICD-10-CM

## 2011-11-22 DIAGNOSIS — M949 Disorder of cartilage, unspecified: Secondary | ICD-10-CM

## 2011-11-22 LAB — CBC WITH DIFFERENTIAL/PLATELET
Basophils Absolute: 0 10*3/uL (ref 0.0–0.1)
Eosinophils Absolute: 0.2 10*3/uL (ref 0.0–0.7)
Hemoglobin: 15.1 g/dL — ABNORMAL HIGH (ref 12.0–15.0)
Lymphocytes Relative: 27.9 % (ref 12.0–46.0)
MCHC: 33.2 g/dL (ref 30.0–36.0)
Neutro Abs: 4.7 10*3/uL (ref 1.4–7.7)
Neutrophils Relative %: 60.3 % (ref 43.0–77.0)
Platelets: 200 10*3/uL (ref 150.0–400.0)
RDW: 13.8 % (ref 11.5–14.6)

## 2011-11-22 LAB — BASIC METABOLIC PANEL
BUN: 14 mg/dL (ref 6–23)
CO2: 28 mEq/L (ref 19–32)
Calcium: 9.5 mg/dL (ref 8.4–10.5)
Creatinine, Ser: 0.9 mg/dL (ref 0.4–1.2)
GFR: 65.85 mL/min (ref >60.00)
Glucose, Bld: 109 mg/dL — ABNORMAL HIGH (ref 70–99)

## 2011-11-22 LAB — TSH: TSH: 11.31 u[IU]/mL — ABNORMAL HIGH (ref 0.35–5.50)

## 2011-11-22 LAB — HEPATIC FUNCTION PANEL
Albumin: 3.7 g/dL (ref 3.5–5.2)
Bilirubin, Direct: 0.1 mg/dL (ref 0.0–0.3)
Total Protein: 6.7 g/dL (ref 6.0–8.3)

## 2011-11-22 LAB — URINALYSIS, ROUTINE W REFLEX MICROSCOPIC
Hgb urine dipstick: NEGATIVE
Nitrite: NEGATIVE
Specific Gravity, Urine: 1.01 (ref 1.000–1.030)
Total Protein, Urine: NEGATIVE
Urobilinogen, UA: 0.2 (ref 0.0–1.0)

## 2011-11-22 LAB — LIPID PANEL
LDL Cholesterol: 77 mg/dL (ref 0–99)
Total CHOL/HDL Ratio: 2

## 2011-11-22 NOTE — Telephone Encounter (Signed)
I spoke with pt and is aware lab orders have been placed. Pt states she will come in the AM prior to her apt for the labs

## 2011-11-23 ENCOUNTER — Ambulatory Visit (INDEPENDENT_AMBULATORY_CARE_PROVIDER_SITE_OTHER): Payer: Medicare Other | Admitting: Pulmonary Disease

## 2011-11-23 ENCOUNTER — Encounter: Payer: Self-pay | Admitting: Pulmonary Disease

## 2011-11-23 ENCOUNTER — Ambulatory Visit (INDEPENDENT_AMBULATORY_CARE_PROVIDER_SITE_OTHER)
Admission: RE | Admit: 2011-11-23 | Discharge: 2011-11-23 | Disposition: A | Discharge status: Home / Self Care | Payer: Medicare Other | Source: Ambulatory Visit | Attending: Pulmonary Disease | Admitting: Pulmonary Disease

## 2011-11-23 VITALS — BP 140/82 | HR 93 | Temp 98.4°F | Ht 66.0 in | Wt 162.2 lb

## 2011-11-23 DIAGNOSIS — R0600 Dyspnea, unspecified: Secondary | ICD-10-CM

## 2011-11-23 DIAGNOSIS — K219 Gastro-esophageal reflux disease without esophagitis: Secondary | ICD-10-CM

## 2011-11-23 DIAGNOSIS — I509 Heart failure, unspecified: Secondary | ICD-10-CM | POA: Diagnosis not present

## 2011-11-23 DIAGNOSIS — C50919 Malignant neoplasm of unspecified site of unspecified female breast: Secondary | ICD-10-CM

## 2011-11-23 DIAGNOSIS — R0989 Other specified symptoms and signs involving the circulatory and respiratory systems: Secondary | ICD-10-CM

## 2011-11-23 DIAGNOSIS — I10 Essential (primary) hypertension: Secondary | ICD-10-CM | POA: Diagnosis not present

## 2011-11-23 DIAGNOSIS — R0609 Other forms of dyspnea: Secondary | ICD-10-CM | POA: Diagnosis not present

## 2011-11-23 DIAGNOSIS — F411 Generalized anxiety disorder: Secondary | ICD-10-CM

## 2011-11-23 DIAGNOSIS — M797 Fibromyalgia: Secondary | ICD-10-CM

## 2011-11-23 DIAGNOSIS — M1990 Unspecified osteoarthritis, unspecified site: Secondary | ICD-10-CM

## 2011-11-23 DIAGNOSIS — I872 Venous insufficiency (chronic) (peripheral): Secondary | ICD-10-CM

## 2011-11-23 DIAGNOSIS — M545 Low back pain: Secondary | ICD-10-CM

## 2011-11-23 DIAGNOSIS — R05 Cough: Secondary | ICD-10-CM | POA: Diagnosis not present

## 2011-11-23 DIAGNOSIS — K589 Irritable bowel syndrome without diarrhea: Secondary | ICD-10-CM

## 2011-11-23 DIAGNOSIS — J42 Unspecified chronic bronchitis: Secondary | ICD-10-CM

## 2011-11-23 DIAGNOSIS — E039 Hypothyroidism, unspecified: Secondary | ICD-10-CM

## 2011-11-23 DIAGNOSIS — I739 Peripheral vascular disease, unspecified: Secondary | ICD-10-CM

## 2011-11-23 MED ORDER — CLONAZEPAM 0.5 MG PO TBDP: 0.5000 mg | ORAL_TABLET | Freq: Two times a day (BID) | ORAL | Status: DC

## 2011-11-23 MED ORDER — IMIPRAMINE HCL 25 MG PO TABS: 25.0000 mg | ORAL_TABLET | Freq: Two times a day (BID) | ORAL | Status: DC

## 2011-11-23 NOTE — Patient Instructions (Signed)
Today we updated your med list in our EPIC system...    Continue your current medications the same...  For your shortness of breath:      Take the new KLONOPIN 0.5mg - 1/2 to 1 tab twice daily to relax your chest wall muscles & allow a deeper/ more satisfying breath...    Start w/ 1/2 tab twice daily on a regular basis & increase as needed for maximum benefit...  Please take the Thyroid med & Vit D supplement regularly:    Take the Levothyroxine tabs- one tab EVERY DAY!!!    Take the Vit D supplement 50,000u once per week, every week...  Today we did your follow up CXR & a breathing test...    We also went over the results of your recent blood work (it's all good except for not taking the thyroid & VitD)...  Let's plan a follow up to check your progress in 6-8 weeks on the meds.Marland KitchenMarland Kitchen

## 2011-11-23 NOTE — Progress Notes (Signed)
Subjective:    Patient ID: [name], female    DOB: 26-Jul-1924, 78 y.o.   MRN: [id number]  HPI 78 y/o WF here for a follow up visit... she has mult med problems as noted below...   ~  Nov 02, 2010:  6wk ROV & she appears reasonably stable> on the Pred 10mg - 1/2 tab daily (keep same for now), and taking the Alpraz 0.5mg  Bid "it helps my nerves" but still c/o "nervous feeling in my chest- I get hot" & I think it would be worth a trial of HYDROXYZINE 25mg  prn for this...  Her CC is still her knee pain & this gets her focused off her other somatic complaints (still follows w/ DrAlusio & Ramos)...  No apparent resp exac; BP controlled on meds; denies ischemic cerebral or other symptoms; GI appears stable & she requests Carafate refill; etc...  ~  03-09-11:  325mo ROV & [name] passed away 67mo ago w/ brain mets from his lung cancer, hospice involved & [name] is doing as well as can be expected but feels very alone, daugh busy w/ her family etc;  She notes some incr in BP & assoc HA recently, measures 164/82 today & up to 180/90 at home> she's been on MetoprololER50 & Verap180 plus her Imdur30; we decided to incr to ToprolXL100 & Verap240 plus low salt diet & switch Xanax to Lorazepam1mg  & take more regularly... Plan ROV in 3wks to recheck BP; she had full labs 3/12 (reviewed); she saw DrRamos 5/12 for lumbar epid steroid injection==> some improvement.  ~  March 22, 2011:  3wk ROV & BP improved ~140/80 both here & at home w/ incr in the Metoprolol & Verapamil, tolerated well; still very anxious "I'm a nervous wreck" not resting well & rec to pursue hospice counseling;  She didn't even mention that her daughter got her a new dog & that they are inseparable companions now... Continue current meds.  ~  July 26, 2011:  325mo ROV & [name] is getting over a URI w/ "head cold" symptoms, hasn't yet had the 2012 Flu vaccine & we gave it today- reminded to get this early in the season next yr... Her CC  remains her left knee discomfort & she saw DrNitka 11/12> she needs a TKR revision but she is against any additional surg;  She did get an injection in her left shoulder w/ some improvement there...     BP remains well controlled on meds & she has no edema; she remains on Plavix & has not had any cerebral ischemic symptoms; her nerves are better on the Lorazepam Rx...  ~  Nov 23, 2011:  325mo ROV & [name] has persistent mult somatic complaints including aching sore etc in back, legs, & arms "acing all over, really"; she is using Voltaren 75mg Bid & ran out of her Vicodin (she gets it from [employer] "I like DrRamos");  She is also not taking her Synthroid & VitD as directed... "I can't understand what happened to me" she says> c/o SOB/ DOE & "I have to take deep breaths" We reviewed her medical problems, meds, xrays & labs> see below>> CXR 5/13 showed cardiomeg, COPD w/o edema or infiltrates, prev right mastectomy, NAD... PFT 5/13 showed FVC=2.13 (75%), FEV1=1.51 (75%), %1sec=71, mid-flows=65% pred (tracings poorly reproduced)...  REC> trial KLONOPIN 0.5mg Bid. LABS 5/13:  FLP- at goals on diet alone;  Chems- wnl w/ BS=109;  CBC- wnl;  TSH=11.31 & not taking Levothy100 regularly;  VitD=22 & not taking  vitD weekly;  UA+bact & few wbc...          Problem List:  BRONCHITIS, RECURRENT (ICD-491.9) - prev off Symbicort & stable until 12/11 w/ refractory cough (esp at night) & reflux related symptoms >> we discussed Depo/ PRED10mg -7d taper, SYMBICORT160-2spBid, TUSSIONEX Qhs, & antireflux  regimen ==> improved but symptoms ret off the Pred therefore restarted Pred 10mg /d 2/12 w/ slower taper... ~  CXR 3/11 showed sternal fx, old right clavic fx, clear lungs, prev right breast surg. ~  CXR 12/11 showed sternal deformity, & few chr changes, s/p right breast ca surg clips, borderline Cor & Ao calcif, NAD. ~  CXR 5/13 showed cardiomeg, COPD w/o edema or infiltrates, prev right mastectomy, NAD... ~  PFT 5/13 showed  FVC=2.13 (75%), FEV1=1.51 (75%), %1sec=71, mid-flows=65% pred (tracings poorly reproduced)...  REC> trial KLONOPIN 0.5mg Bid for dyspnea & "can't get a deep breath"  HYPERTENSION (ICD-401.9) - on TOPROL XL 100mg /d, VERAPAMIL 240mg /d, & IMDUR 30mg /d... not on a diuretic due to [facility]. ~  2DEcho 7/08 showed LVH and asymmetric septal hypertrophy- on Toprol & Verapamil. ~  3/12:  BP = 150/90 today & denies HA, visual changes, CP, palipit, dizziness, syncope, dyspnea, etc... she notes some fatigue and edema... ~  5/12:  BP= 132/70 & she is unchanged... ~  8/12:  BP elev w/ the stress of husb's passing; we decided to incr the ToprolXL to 100mg /d & the Verapamil to 240mg /d, plus low sodium etc... ~  1/13:  BP= 134/86 & she denies CP, palpit, SOB, edema> continue same... ~  5/13:  140/82 & while she c/o DOE/ SOB, she denies CP/ palpit/ edema...  CONGESTIVE HEART FAILURE (ICD-428.0) - pt was felt to have diast dysfuction by cardiology in 2007. ~  hx atypic CP w/ neg Myoview 5/07 showing no ischemia or infarction & EF=80%; BNP=88 in 2008... ~  holter monitor w/ PVC's only (eval for palpit and dizzy)  PERIPHERAL VASCULAR DISEASE (ICD-443.9) -  on PLAVIX 75mg /d, she stopped ASA on her own... Hx of ASPVD with previous TIA- evaluation revealed CT brain with moderate atrophy and small-vessel disease; MRI confirms; MRA showed moderate branch vessel arteriosclerotic changes and MRA of the neck showed several stenoses including 30% stenosis proximal right  innominate artery, 70% stenosis proximal left CCA, 45% prox left subclav, 40% right subclav, & ?12% prox left ICA stenosis. NOTE: some of this was felt to be artifactual by DrCooper on his eval 8/08- CDoppler w/ 0-39% bilat ICAs.   VENOUS INSUFFICIENCY (ICD-459.81) - she's had some edema in the past... control w/ low sodium, elevation, and support hose... avoid diuretics w/ ASH.  GERD (ICD-530.81) - on CARAFATE 1gm/39ml- taking 1tspQid & off prev Nexium (was on 40mg   Bid). ~  hx of "supersensitive esophagus" per DrPatterson w/ hx Nissen Fundoplication in the 90's and subseq re-do Lap Nissen w/ gore-tex patch in 2006 by DrMMartin. ~  prev EGD 7/07 showed HH surg x2, Barrett's esoph, gastritis w/ neg HPylori... ~  extensive eval in Jun09 by DrPatterson w/ recurrent reflux symptoms, CP & dysphagia... EGD w/ severe esophagitis/ gastritis- HPylori neg and Bx neg for eosinophilic esoph... Barium esophogram showed recurrent hernia w/ marked GE reflux... on max acid suppression and referred to DrMMartin for further surgery... he sent her back to DrPatterson w/ another EGD 10/09 showing intact Nissen, ?Barrett's & gastritis... she remains on max meds w/ Nexium/ Carafate. ~  12/11:  Add-on for noct cough & rec for max antireflux regimen> elev HOB 6" blocks,  NPO after dinner, etc...  IRRITABLE BOWEL SYNDROME (ICD-564.1) - ? bact overgrowth in the past Rx'd w/ Xifaxan & Align... also taking LEVSIN Prn and IMIPRAMINE Bid... ~  last colonoscopy 3/08 by DrPatterson showed divertics only...  Hx of UTI (ICD-599.0)  Hx of ADENOCARCINOMA, BREAST (ICD-174.9) - see prev notes from surg & oncology.  HYPOTHYROIDISM (ICD-244.9) - on SYNTHROID 192mcg/d... ~  labs 3/10 on Synth100 showed TSH= 2.37 ~  Labs 3/12 showed TSH= 5.28... Reminded to take med every day! ~  Labs 5/13 showed TSH= 11.31 & she admits to irregular dosing! Again asked to take it every day..  DEGENERATIVE JOINT DISEASE (ICD-715.90) - s/p left TKR 1/09 by DrBlackmon... she takes VICODIN ES 7.5/750 per DrRamos...  also uses VOLTAREN GEL Prn... ~  c/o considerable left knee pain since surg> eval by Susann Givens (he did synovectomy 6/10), DrRamos (he feels poss neuropathic pain & tried nerve block Rx w/ IMIPRAMINE 25mg Bid), and 2nd opinion DrHowe at [facility]... ~  she saw DrRamos & Alusio> neuropathic pain w/ trial LYRICA 75mg  1-2 daily; Bone Scan was neg w/o knee uptake. ~  persistant discomfort left knee despite everything &  she says "Susann Givens knows there is something bad wrong in there but he can't find it"> he has rec TKR revision but she is against more surg... ~  1/13:  She requests anti-inflamm arthritis med, she likes the Voltaren gel, therefore Rx for DICLOFENAC 75mg  Bid prn...  LOW BACK PAIN, CHRONIC (ICD-724.2) - she has had 2 prev back surgeries and prev CSpine surgery as well...s/p epid steroid shots by DrRamos... ~  5/13:  She reports a shot in her back by DrRamos was very helpful...  OSTEOPENIA (ICD-733.90) - on EVISTA 60mg /d, + calcium, vits... ~  labs 3/10 showed Vit D level = 21... rec> Vit D OTC 2000 u daily. ~  labs 3/11 showed Vit D level = 15... rec> take Vit D 2000 u daily. ~  9/11: pt indicates that she is still not taking OTC Vit D supplement- rec to switch to 50,000 u weekly Rx. ~  Labs 5/13 showed Vit D level = 22 & not taking the Vit D supplement weekly as sched...  TRANSIENT ISCHEMIC ATTACK (ICD-435.9) - on PLAVIX 75mg /d & she stopped ASA on her own... ~  CTBrain 7/08 showed mod atrophy and sm vessel dis...  ~  MRI Brain w/ similar findings & MRA showed mod branch vessel ateriosclerotic changes... ~  MRA neck showed mult stenoses- 30% prox R innominate, 70% prox LICA, 45% prox L subclav, etc.     This was felt to be artifactual? and subseq eval by DrCooper 8/08 showed CDoppler w/ min plaque only, 0-39% bilat.  ANXIETY (ICD-300.00) - she has mult somatic complaints> prev on Alprazolam 0.5 prn... ~  8/12:  She doesn't like the Alpraz, so we switched to LORAZEPAM 1mg  tid prn... ~  5/13:  We opted for a trial of KLONOPIN 0.5mg  Bid for her symptoms...   Past Surgical History  Procedure Date  . Total knee arthroplasty 1 2009    left, Dr. Magnus Ivan  . Nissen fundoplication     and re-do nissen with Gore-Tex ptch 2006 Dr. Daphine Deutscher  . Appendectomy   . Abdominal hysterectomy   . Mastectomy     Outpatient Encounter Prescriptions as of 11/23/2011  Medication Sig Dispense Refill  .  budesonide-formoterol (SYMBICORT) 160-4.5 MCG/ACT inhaler Inhale 2 puffs into the lungs 2 (two) times daily.        . clopidogrel (  PLAVIX) 75 MG tablet Take 1 tablet (75 mg total) by mouth daily.  90 tablet  3  . diclofenac (VOLTAREN) 75 MG EC tablet Take 1 tablet (75 mg total) by mouth 2 (two) times daily.  180 tablet  3  . diclofenac sodium (VOLTAREN) 1 % GEL Use as directed  3 Tube  3  . guaiFENesin (MUCINEX) 600 MG 12 hr tablet Take 600 mg by mouth as needed.      Marland Kitchen imipramine (TOFRANIL) 25 MG tablet Take 1 tablet (25 mg total) by mouth 2 (two) times daily.  180 tablet  3  . levothyroxine (SYNTHROID, LEVOTHROID) 100 MCG tablet Take 1 tablet (100 mcg total) by mouth daily.  90 tablet  3  . LORazepam (ATIVAN) 1 MG tablet Take 1/2 tablet to 1 tablet three times a day as needed  90 tablet  5  . metoprolol (TOPROL XL) 100 MG 24 hr tablet Take 1 tablet (100 mg total) by mouth daily.  90 tablet  3  . sucralfate (CARAFATE) 1 GM/10ML suspension Take 1 tsp by mouth 4 times per day as directed  1800 mL  3  . verapamil (CALAN-SR) 240 MG CR tablet Take 1 tablet (240 mg total) by mouth daily.  90 tablet  3  . DISCONTD: guaiFENesin (MUCINEX) 600 MG 12 hr tablet Take 1 tablet (600 mg total) by mouth 2 (two) times daily.      . Cholecalciferol (VITAMIN D3) 50000 UNITS CAPS Take 1 capsule by mouth once a week.  4 capsule  11  . HYDROcodone-acetaminophen (VICODIN ES) 7.5-750 MG per tablet Take 1 tablet by mouth 2 (two) times daily as needed.        . isosorbide mononitrate (IMDUR) 30 MG 24 hr tablet Take 30 mg by mouth daily.          Allergies  Allergen Reactions  . Codeine     REACTION: itching  . Pregabalin     REACTION: causes nervousness    Current Medications, Allergies, Past Medical History, Past Surgical History, Family History, and Social History were reviewed in [employer] record.    Review of Systems        See HPI - all other systems neg except as noted... The  patient complains of dyspnea on exertion and sl HA.  The patient denies anorexia, fever, weight loss, weight gain, vision loss, decreased hearing, hoarseness, chest pain, syncope, peripheral edema, prolonged cough, headaches, hemoptysis, abdominal pain, melena, hematochezia, severe indigestion/heartburn, hematuria, incontinence, suspicious skin lesions, transient blindness, difficulty walking, depression, unusual weight change, abnormal bleeding, enlarged lymph nodes, and angioedema.     Objective:   Physical Exam     WD, WN, 78 y/o WF in NAD... GENERAL:  Alert & oriented; pleasant & cooperative... HEENT:  /AT, EOM-wnl, PERRLA, EACs-clear, TMs-wnl, NOSE-clear, THROAT-clear & wnl. NECK:  Supple w/ fairROM; no JVD; normal carotid impulses w/o bruits; no thyromegaly or nodules palpated; no lymphadenopathy. CHEST:  Chest is essent clear now> no rales rhonchi or signs of consolidation... HEART:  Regular Rhythm;  gr 1/6 SEM without rubs or gallops detected... ABDOMEN:  Soft & min epig tender; normal bowel sounds; no organomegaly or masses palpated... EXT: without deformities, mild arthritic changes; no varicose veins/ +venous insuffic/ tr edema. some pain w/ ROM left knee> not red, hot, swollen, etc... NEURO:  CN's intact;  no focal neuro deficits... DERM:  No lesions noted; no rash etc...  RADIOLOGY DATA:  Reviewed in the EPIC EMR & discussed  w/ the patient...  LABORATORY DATA:  Reviewed in the EPIC EMR & discussed w/ the patient...   Assessment & Plan:   HBP>  BP improved w/ METOPROLOL ER 100mg /d & VERAPAMIL 240mg /d; she knows to elim salt & get wt down...  Bronchitis>  Refractory cough has resolved & she stopped the Pred, Symbicort, Tussionex, etc; advised to continue the antireflux regimen etc...  ASPVD, Hx TIA>  On Plavix w/o ischemic symptoms, continue same...  GERD>  She needs to be diligent about the antireflux regimen... F/u w/ DrPatterson prn.  DJD>  On going Ortho issues are  her CC & attended by DrAlusio, DrRamos, etal... She had another epid steroid shot 5/13 from DrRamos- helped...  ANXIETY>  Stress from loss of husb & family issues;  Stable on LORAZEPAM 1mg  Tid Prn...   Patient's Medications  New Prescriptions   CLONAZEPAM (KLONOPIN) 0.5 MG DISINTEGRATING TABLET    Take 1 tablet (0.5 mg total) by mouth 2 (two) times daily.  Previous Medications   BUDESONIDE-FORMOTEROL (SYMBICORT) 160-4.5 MCG/ACT INHALER    Inhale 2 puffs into the lungs 2 (two) times daily.     CHOLECALCIFEROL (VITAMIN D3) 50000 UNITS CAPS    Take 1 capsule by mouth once a week.   CLOPIDOGREL (PLAVIX) 75 MG TABLET    Take 1 tablet (75 mg total) by mouth daily.   DICLOFENAC (VOLTAREN) 75 MG EC TABLET    Take 1 tablet (75 mg total) by mouth 2 (two) times daily.   DICLOFENAC SODIUM (VOLTAREN) 1 % GEL    Use as directed   HYDROCODONE-ACETAMINOPHEN (VICODIN ES) 7.5-750 MG PER TABLET    Take 1 tablet by mouth 2 (two) times daily as needed.     ISOSORBIDE MONONITRATE (IMDUR) 30 MG 24 HR TABLET    Take 30 mg by mouth daily.     LEVOTHYROXINE (SYNTHROID, LEVOTHROID) 100 MCG TABLET    Take 1 tablet (100 mcg total) by mouth daily.   LORAZEPAM (ATIVAN) 1 MG TABLET    Take 1/2 tablet to 1 tablet three times a day as needed   METOPROLOL (TOPROL XL) 100 MG 24 HR TABLET    Take 1 tablet (100 mg total) by mouth daily.   SUCRALFATE (CARAFATE) 1 GM/10ML SUSPENSION    Take 1 tsp by mouth 4 times per day as directed   VERAPAMIL (CALAN-SR) 240 MG CR TABLET    Take 1 tablet (240 mg total) by mouth daily.  Modified Medications   Modified Medication Previous Medication   GUAIFENESIN (MUCINEX) 600 MG 12 HR TABLET guaiFENesin (MUCINEX) 600 MG 12 hr tablet      Take 600 mg by mouth as needed.    Take 1 tablet (600 mg total) by mouth 2 (two) times daily.   IMIPRAMINE (TOFRANIL) 25 MG TABLET imipramine (TOFRANIL) 25 MG tablet      Take 1 tablet (25 mg total) by mouth 2 (two) times daily.    Take 1 tablet (25 mg total) by  mouth 2 (two) times daily.  Discontinued Medications   No medications on file

## 2011-11-24 ENCOUNTER — Telehealth: Payer: Self-pay | Admitting: Pulmonary Disease

## 2011-11-24 NOTE — Telephone Encounter (Signed)
Notes Recorded by Michele Mcalpine, MD on 11/24/2011 at 8:23 AM Please notify patient>  CXR looks ok> no infiltrate, no spots, no fluid...  I spoke with patient about results and she verbalized understanding and had no questions

## 2011-12-08 ENCOUNTER — Telehealth: Payer: Self-pay | Admitting: Pulmonary Disease

## 2011-12-08 MED ORDER — HYDROCODONE-ACETAMINOPHEN 7.5-750 MG PO TABS: 1.0000 | ORAL_TABLET | Freq: Two times a day (BID) | ORAL | Status: DC | PRN

## 2011-12-08 NOTE — Telephone Encounter (Signed)
Per SN---ok to refill the vicodin #90   1 po tid --do not exceed 3 per day  With no refills.  thanks

## 2011-12-08 NOTE — Telephone Encounter (Signed)
I spoke with pt and she is asking if SN would refill her vicodin for her. She has been having a lot of left knee pain, back pain, and left leg pain x forever per pt. She states SN had advised her to speak with Dr. Ethelene Hal regarding this but she still has not heard anything from them. She ran out of her vicodin last week and does not have anything else to take. Please advise SN thanks

## 2011-12-08 NOTE — Telephone Encounter (Signed)
Pt is on vicodin 7.5-750 and states 1 po BID PRN. Please advise if still okay to send for TID, thanks

## 2011-12-08 NOTE — Telephone Encounter (Signed)
LMTCB

## 2011-12-08 NOTE — Telephone Encounter (Signed)
Rx was called to pharm and I spoke with pt and notified her that this was done. She verbalized understanding and states nothing further needed.

## 2011-12-08 NOTE — Telephone Encounter (Signed)
Ok to send in the vicodin 7.5/750   #60 with 1 bid with no refills.  thanks

## 2011-12-19 ENCOUNTER — Emergency Department (HOSPITAL_COMMUNITY): Payer: Medicare Other

## 2011-12-19 ENCOUNTER — Inpatient Hospital Stay (HOSPITAL_COMMUNITY): Payer: Medicare Other

## 2011-12-19 ENCOUNTER — Encounter (HOSPITAL_COMMUNITY): Admission: EM | Disposition: A | Discharge status: Skilled Nursing Facility | Payer: Self-pay | Source: Ambulatory Visit

## 2011-12-19 ENCOUNTER — Inpatient Hospital Stay (HOSPITAL_COMMUNITY)
Admission: EM | Admit: 2011-12-19 | Discharge: 2012-01-03 | DRG: 503 | Disposition: A | Discharge status: Skilled Nursing Facility | Payer: Medicare Other | Source: Ambulatory Visit | Attending: General Surgery | Admitting: General Surgery

## 2011-12-19 ENCOUNTER — Encounter (HOSPITAL_COMMUNITY): Payer: Self-pay | Admitting: Certified Registered"

## 2011-12-19 ENCOUNTER — Inpatient Hospital Stay (HOSPITAL_COMMUNITY): Payer: Medicare Other | Admitting: Certified Registered"

## 2011-12-19 DIAGNOSIS — I469 Cardiac arrest, cause unspecified: Secondary | ICD-10-CM | POA: Diagnosis not present

## 2011-12-19 DIAGNOSIS — J90 Pleural effusion, not elsewhere classified: Secondary | ICD-10-CM | POA: Diagnosis not present

## 2011-12-19 DIAGNOSIS — E46 Unspecified protein-calorie malnutrition: Secondary | ICD-10-CM | POA: Diagnosis not present

## 2011-12-19 DIAGNOSIS — I509 Heart failure, unspecified: Secondary | ICD-10-CM | POA: Diagnosis not present

## 2011-12-19 DIAGNOSIS — Z9289 Personal history of other medical treatment: Secondary | ICD-10-CM

## 2011-12-19 DIAGNOSIS — I4891 Unspecified atrial fibrillation: Secondary | ICD-10-CM | POA: Diagnosis not present

## 2011-12-19 DIAGNOSIS — E039 Hypothyroidism, unspecified: Secondary | ICD-10-CM | POA: Diagnosis not present

## 2011-12-19 DIAGNOSIS — S91009A Unspecified open wound, unspecified ankle, initial encounter: Secondary | ICD-10-CM | POA: Diagnosis present

## 2011-12-19 DIAGNOSIS — T07XXXA Unspecified multiple injuries, initial encounter: Secondary | ICD-10-CM

## 2011-12-19 DIAGNOSIS — S81809A Unspecified open wound, unspecified lower leg, initial encounter: Secondary | ICD-10-CM | POA: Diagnosis not present

## 2011-12-19 DIAGNOSIS — R579 Shock, unspecified: Secondary | ICD-10-CM | POA: Diagnosis not present

## 2011-12-19 DIAGNOSIS — M7989 Other specified soft tissue disorders: Secondary | ICD-10-CM | POA: Diagnosis not present

## 2011-12-19 DIAGNOSIS — IMO0001 Reserved for inherently not codable concepts without codable children: Secondary | ICD-10-CM | POA: Diagnosis present

## 2011-12-19 DIAGNOSIS — Z4789 Encounter for other orthopedic aftercare: Secondary | ICD-10-CM | POA: Diagnosis not present

## 2011-12-19 DIAGNOSIS — I10 Essential (primary) hypertension: Secondary | ICD-10-CM | POA: Diagnosis present

## 2011-12-19 DIAGNOSIS — S93326A Dislocation of tarsometatarsal joint of unspecified foot, initial encounter: Secondary | ICD-10-CM | POA: Diagnosis not present

## 2011-12-19 DIAGNOSIS — S3981XA Other specified injuries of abdomen, initial encounter: Secondary | ICD-10-CM | POA: Diagnosis not present

## 2011-12-19 DIAGNOSIS — Z515 Encounter for palliative care: Secondary | ICD-10-CM | POA: Diagnosis not present

## 2011-12-19 DIAGNOSIS — Z96659 Presence of unspecified artificial knee joint: Secondary | ICD-10-CM

## 2011-12-19 DIAGNOSIS — S8252XA Displaced fracture of medial malleolus of left tibia, initial encounter for closed fracture: Secondary | ICD-10-CM

## 2011-12-19 DIAGNOSIS — G890 Central pain syndrome: Secondary | ICD-10-CM | POA: Diagnosis not present

## 2011-12-19 DIAGNOSIS — J9383 Other pneumothorax: Secondary | ICD-10-CM

## 2011-12-19 DIAGNOSIS — Z66 Do not resuscitate: Secondary | ICD-10-CM | POA: Diagnosis not present

## 2011-12-19 DIAGNOSIS — D649 Anemia, unspecified: Secondary | ICD-10-CM | POA: Diagnosis not present

## 2011-12-19 DIAGNOSIS — I519 Heart disease, unspecified: Secondary | ICD-10-CM | POA: Diagnosis present

## 2011-12-19 DIAGNOSIS — S81009A Unspecified open wound, unspecified knee, initial encounter: Secondary | ICD-10-CM | POA: Diagnosis present

## 2011-12-19 DIAGNOSIS — S52599A Other fractures of lower end of unspecified radius, initial encounter for closed fracture: Secondary | ICD-10-CM | POA: Diagnosis present

## 2011-12-19 DIAGNOSIS — N170 Acute kidney failure with tubular necrosis: Secondary | ICD-10-CM | POA: Diagnosis not present

## 2011-12-19 DIAGNOSIS — S2220XA Unspecified fracture of sternum, initial encounter for closed fracture: Secondary | ICD-10-CM | POA: Diagnosis present

## 2011-12-19 DIAGNOSIS — Z853 Personal history of malignant neoplasm of breast: Secondary | ICD-10-CM

## 2011-12-19 DIAGNOSIS — R918 Other nonspecific abnormal finding of lung field: Secondary | ICD-10-CM | POA: Diagnosis not present

## 2011-12-19 DIAGNOSIS — K449 Diaphragmatic hernia without obstruction or gangrene: Secondary | ICD-10-CM

## 2011-12-19 DIAGNOSIS — S92309B Fracture of unspecified metatarsal bone(s), unspecified foot, initial encounter for open fracture: Secondary | ICD-10-CM | POA: Diagnosis not present

## 2011-12-19 DIAGNOSIS — K589 Irritable bowel syndrome without diarrhea: Secondary | ICD-10-CM | POA: Diagnosis present

## 2011-12-19 DIAGNOSIS — J95821 Acute postprocedural respiratory failure: Secondary | ICD-10-CM | POA: Diagnosis present

## 2011-12-19 DIAGNOSIS — S92309A Fracture of unspecified metatarsal bone(s), unspecified foot, initial encounter for closed fracture: Principal | ICD-10-CM | POA: Diagnosis present

## 2011-12-19 DIAGNOSIS — S270XXA Traumatic pneumothorax, initial encounter: Secondary | ICD-10-CM | POA: Diagnosis present

## 2011-12-19 DIAGNOSIS — S27329A Contusion of lung, unspecified, initial encounter: Secondary | ICD-10-CM | POA: Diagnosis not present

## 2011-12-19 DIAGNOSIS — G459 Transient cerebral ischemic attack, unspecified: Secondary | ICD-10-CM | POA: Diagnosis not present

## 2011-12-19 DIAGNOSIS — Z8673 Personal history of transient ischemic attack (TIA), and cerebral infarction without residual deficits: Secondary | ICD-10-CM

## 2011-12-19 DIAGNOSIS — I872 Venous insufficiency (chronic) (peripheral): Secondary | ICD-10-CM | POA: Diagnosis present

## 2011-12-19 DIAGNOSIS — J9610 Chronic respiratory failure, unspecified whether with hypoxia or hypercapnia: Secondary | ICD-10-CM | POA: Diagnosis not present

## 2011-12-19 DIAGNOSIS — S62309A Unspecified fracture of unspecified metacarpal bone, initial encounter for closed fracture: Secondary | ICD-10-CM | POA: Diagnosis not present

## 2011-12-19 DIAGNOSIS — I739 Peripheral vascular disease, unspecified: Secondary | ICD-10-CM | POA: Diagnosis present

## 2011-12-19 DIAGNOSIS — S8010XA Contusion of unspecified lower leg, initial encounter: Secondary | ICD-10-CM | POA: Diagnosis not present

## 2011-12-19 DIAGNOSIS — R0902 Hypoxemia: Secondary | ICD-10-CM | POA: Diagnosis not present

## 2011-12-19 DIAGNOSIS — R062 Wheezing: Secondary | ICD-10-CM | POA: Diagnosis not present

## 2011-12-19 DIAGNOSIS — D62 Acute posthemorrhagic anemia: Secondary | ICD-10-CM | POA: Diagnosis not present

## 2011-12-19 DIAGNOSIS — I2699 Other pulmonary embolism without acute cor pulmonale: Secondary | ICD-10-CM | POA: Diagnosis not present

## 2011-12-19 DIAGNOSIS — R58 Hemorrhage, not elsewhere classified: Secondary | ICD-10-CM | POA: Diagnosis present

## 2011-12-19 DIAGNOSIS — S52609A Unspecified fracture of lower end of unspecified ulna, initial encounter for closed fracture: Secondary | ICD-10-CM | POA: Diagnosis not present

## 2011-12-19 DIAGNOSIS — Y92410 Unspecified street and highway as the place of occurrence of the external cause: Secondary | ICD-10-CM

## 2011-12-19 DIAGNOSIS — J9601 Acute respiratory failure with hypoxia: Secondary | ICD-10-CM | POA: Diagnosis present

## 2011-12-19 DIAGNOSIS — R1310 Dysphagia, unspecified: Secondary | ICD-10-CM | POA: Diagnosis not present

## 2011-12-19 DIAGNOSIS — E872 Acidosis, unspecified: Secondary | ICD-10-CM | POA: Diagnosis present

## 2011-12-19 DIAGNOSIS — S52502A Unspecified fracture of the lower end of left radius, initial encounter for closed fracture: Secondary | ICD-10-CM

## 2011-12-19 DIAGNOSIS — S8253XA Displaced fracture of medial malleolus of unspecified tibia, initial encounter for closed fracture: Secondary | ICD-10-CM | POA: Diagnosis not present

## 2011-12-19 DIAGNOSIS — S298XXA Other specified injuries of thorax, initial encounter: Secondary | ICD-10-CM | POA: Diagnosis not present

## 2011-12-19 DIAGNOSIS — Z9911 Dependence on respirator [ventilator] status: Secondary | ICD-10-CM | POA: Diagnosis not present

## 2011-12-19 DIAGNOSIS — S52509A Unspecified fracture of the lower end of unspecified radius, initial encounter for closed fracture: Secondary | ICD-10-CM | POA: Diagnosis not present

## 2011-12-19 DIAGNOSIS — R4182 Altered mental status, unspecified: Secondary | ICD-10-CM | POA: Diagnosis not present

## 2011-12-19 DIAGNOSIS — S2249XA Multiple fractures of ribs, unspecified side, initial encounter for closed fracture: Secondary | ICD-10-CM | POA: Diagnosis not present

## 2011-12-19 DIAGNOSIS — E669 Obesity, unspecified: Secondary | ICD-10-CM | POA: Diagnosis present

## 2011-12-19 DIAGNOSIS — K219 Gastro-esophageal reflux disease without esophagitis: Secondary | ICD-10-CM | POA: Diagnosis present

## 2011-12-19 DIAGNOSIS — E876 Hypokalemia: Secondary | ICD-10-CM | POA: Diagnosis not present

## 2011-12-19 DIAGNOSIS — I517 Cardiomegaly: Secondary | ICD-10-CM | POA: Diagnosis not present

## 2011-12-19 DIAGNOSIS — IMO0002 Reserved for concepts with insufficient information to code with codable children: Secondary | ICD-10-CM | POA: Diagnosis not present

## 2011-12-19 DIAGNOSIS — S82899A Other fracture of unspecified lower leg, initial encounter for closed fracture: Secondary | ICD-10-CM | POA: Diagnosis not present

## 2011-12-19 DIAGNOSIS — S61409A Unspecified open wound of unspecified hand, initial encounter: Secondary | ICD-10-CM | POA: Diagnosis present

## 2011-12-19 DIAGNOSIS — F411 Generalized anxiety disorder: Secondary | ICD-10-CM | POA: Diagnosis not present

## 2011-12-19 DIAGNOSIS — S8263XB Displaced fracture of lateral malleolus of unspecified fibula, initial encounter for open fracture type I or II: Secondary | ICD-10-CM | POA: Diagnosis not present

## 2011-12-19 DIAGNOSIS — S8263XA Displaced fracture of lateral malleolus of unspecified fibula, initial encounter for closed fracture: Secondary | ICD-10-CM | POA: Diagnosis present

## 2011-12-19 DIAGNOSIS — N179 Acute kidney failure, unspecified: Secondary | ICD-10-CM | POA: Diagnosis not present

## 2011-12-19 DIAGNOSIS — S6990XA Unspecified injury of unspecified wrist, hand and finger(s), initial encounter: Secondary | ICD-10-CM | POA: Diagnosis not present

## 2011-12-19 DIAGNOSIS — Z79899 Other long term (current) drug therapy: Secondary | ICD-10-CM

## 2011-12-19 DIAGNOSIS — S42009A Fracture of unspecified part of unspecified clavicle, initial encounter for closed fracture: Secondary | ICD-10-CM | POA: Diagnosis present

## 2011-12-19 DIAGNOSIS — R0602 Shortness of breath: Secondary | ICD-10-CM | POA: Diagnosis not present

## 2011-12-19 DIAGNOSIS — I359 Nonrheumatic aortic valve disorder, unspecified: Secondary | ICD-10-CM | POA: Diagnosis not present

## 2011-12-19 DIAGNOSIS — M25579 Pain in unspecified ankle and joints of unspecified foot: Secondary | ICD-10-CM | POA: Diagnosis not present

## 2011-12-19 DIAGNOSIS — J9600 Acute respiratory failure, unspecified whether with hypoxia or hypercapnia: Secondary | ICD-10-CM | POA: Diagnosis not present

## 2011-12-19 DIAGNOSIS — J9819 Other pulmonary collapse: Secondary | ICD-10-CM | POA: Diagnosis not present

## 2011-12-19 DIAGNOSIS — I499 Cardiac arrhythmia, unspecified: Secondary | ICD-10-CM | POA: Diagnosis not present

## 2011-12-19 DIAGNOSIS — S40029A Contusion of unspecified upper arm, initial encounter: Secondary | ICD-10-CM | POA: Diagnosis not present

## 2011-12-19 DIAGNOSIS — R7309 Other abnormal glucose: Secondary | ICD-10-CM | POA: Diagnosis present

## 2011-12-19 DIAGNOSIS — M899 Disorder of bone, unspecified: Secondary | ICD-10-CM | POA: Diagnosis present

## 2011-12-19 DIAGNOSIS — T148XXA Other injury of unspecified body region, initial encounter: Secondary | ICD-10-CM | POA: Diagnosis not present

## 2011-12-19 DIAGNOSIS — S8990XA Unspecified injury of unspecified lower leg, initial encounter: Secondary | ICD-10-CM

## 2011-12-19 DIAGNOSIS — Z09 Encounter for follow-up examination after completed treatment for conditions other than malignant neoplasm: Secondary | ICD-10-CM | POA: Diagnosis not present

## 2011-12-19 DIAGNOSIS — T794XXA Traumatic shock, initial encounter: Secondary | ICD-10-CM | POA: Diagnosis present

## 2011-12-19 DIAGNOSIS — M949 Disorder of cartilage, unspecified: Secondary | ICD-10-CM | POA: Diagnosis present

## 2011-12-19 DIAGNOSIS — E559 Vitamin D deficiency, unspecified: Secondary | ICD-10-CM | POA: Diagnosis not present

## 2011-12-19 DIAGNOSIS — M1990 Unspecified osteoarthritis, unspecified site: Secondary | ICD-10-CM | POA: Diagnosis present

## 2011-12-19 DIAGNOSIS — D696 Thrombocytopenia, unspecified: Secondary | ICD-10-CM | POA: Diagnosis not present

## 2011-12-19 DIAGNOSIS — R079 Chest pain, unspecified: Secondary | ICD-10-CM | POA: Diagnosis not present

## 2011-12-19 DIAGNOSIS — S92902A Unspecified fracture of left foot, initial encounter for closed fracture: Secondary | ICD-10-CM

## 2011-12-19 DIAGNOSIS — Z4689 Encounter for fitting and adjustment of other specified devices: Secondary | ICD-10-CM | POA: Diagnosis not present

## 2011-12-19 DIAGNOSIS — R34 Anuria and oliguria: Secondary | ICD-10-CM | POA: Diagnosis not present

## 2011-12-19 DIAGNOSIS — S42033A Displaced fracture of lateral end of unspecified clavicle, initial encounter for closed fracture: Secondary | ICD-10-CM | POA: Diagnosis not present

## 2011-12-19 DIAGNOSIS — M79609 Pain in unspecified limb: Secondary | ICD-10-CM | POA: Diagnosis not present

## 2011-12-19 DIAGNOSIS — S52539B Colles' fracture of unspecified radius, initial encounter for open fracture type I or II: Secondary | ICD-10-CM | POA: Diagnosis not present

## 2011-12-19 DIAGNOSIS — J9690 Respiratory failure, unspecified, unspecified whether with hypoxia or hypercapnia: Secondary | ICD-10-CM

## 2011-12-19 DIAGNOSIS — J939 Pneumothorax, unspecified: Secondary | ICD-10-CM

## 2011-12-19 DIAGNOSIS — Z043 Encounter for examination and observation following other accident: Secondary | ICD-10-CM | POA: Diagnosis not present

## 2011-12-19 DIAGNOSIS — E8779 Other fluid overload: Secondary | ICD-10-CM | POA: Diagnosis not present

## 2011-12-19 DIAGNOSIS — S8261XA Displaced fracture of lateral malleolus of right fibula, initial encounter for closed fracture: Secondary | ICD-10-CM

## 2011-12-19 HISTORY — DX: Personal history of other medical treatment: Z92.89

## 2011-12-19 HISTORY — PX: I&D EXTREMITY: SHX5045

## 2011-12-19 HISTORY — PX: APPLICATION OF WOUND VAC: SHX5189

## 2011-12-19 LAB — POCT I-STAT 3, ART BLOOD GAS (G3+)
Acid-base deficit: 6 mmol/L — ABNORMAL HIGH (ref 0.0–2.0)
Acid-base deficit: 7 mmol/L — ABNORMAL HIGH (ref 0.0–2.0)
Bicarbonate: 19.5 mEq/L — ABNORMAL LOW (ref 20.0–24.0)
O2 Saturation: 88 %
Patient temperature: 35.1
pH, Arterial: 7.314 — ABNORMAL LOW (ref 7.350–7.400)

## 2011-12-19 LAB — POCT I-STAT, CHEM 8
Calcium, Ion: 1.33 mmol/L — ABNORMAL HIGH (ref 1.12–1.32)
Chloride: 106 mEq/L (ref 96–112)
HCT: 43 % (ref 36.0–46.0)
Hemoglobin: 14.6 g/dL (ref 12.0–15.0)
Potassium: 4.4 mEq/L (ref 3.5–5.1)

## 2011-12-19 LAB — CBC
Hemoglobin: 11.6 g/dL — ABNORMAL LOW (ref 12.0–15.0)
MCH: 31.3 pg (ref 26.0–34.0)
MCV: 94.6 fL (ref 78.0–100.0)
Platelets: 200 10*3/uL (ref 150–400)
RBC: 3.73 MIL/uL — ABNORMAL LOW (ref 3.87–5.11)
RDW: 13.6 % (ref 11.5–15.5)
WBC: 17.5 10*3/uL — ABNORMAL HIGH (ref 4.0–10.5)

## 2011-12-19 LAB — HEMOGLOBIN AND HEMATOCRIT, BLOOD
HCT: 24.1 % — ABNORMAL LOW (ref 36.0–46.0)
Hemoglobin: 8.5 g/dL — ABNORMAL LOW (ref 12.0–15.0)

## 2011-12-19 LAB — URINALYSIS, MICROSCOPIC ONLY
Bilirubin Urine: NEGATIVE
Ketones, ur: NEGATIVE mg/dL
Nitrite: NEGATIVE
Urobilinogen, UA: 0.2 mg/dL (ref 0.0–1.0)

## 2011-12-19 LAB — CARDIAC PANEL(CRET KIN+CKTOT+MB+TROPI)
CK, MB: 16.3 ng/mL (ref 0.3–4.0)
Total CK: 508 U/L — ABNORMAL HIGH (ref 7–177)
Troponin I: 3.95 ng/mL (ref <0.30)

## 2011-12-19 LAB — COMPREHENSIVE METABOLIC PANEL
ALT: 88 U/L — ABNORMAL HIGH (ref 0–35)
AST: 137 U/L — ABNORMAL HIGH (ref 0–37)
Albumin: 3 g/dL — ABNORMAL LOW (ref 3.5–5.2)
Alkaline Phosphatase: 67 U/L (ref 39–117)
Chloride: 105 mEq/L (ref 96–112)
Potassium: 4.2 mEq/L (ref 3.5–5.1)
Sodium: 140 mEq/L (ref 135–145)
Total Bilirubin: 0.3 mg/dL (ref 0.3–1.2)

## 2011-12-19 LAB — PROTIME-INR
INR: 1.17 (ref 0.00–1.49)
INR: 3.02 — ABNORMAL HIGH (ref 0.00–1.49)
Prothrombin Time: 15.1 seconds (ref 11.6–15.2)
Prothrombin Time: 31.8 seconds — ABNORMAL HIGH (ref 11.6–15.2)

## 2011-12-19 LAB — CARBOXYHEMOGLOBIN
Carboxyhemoglobin: 1 % (ref 0.5–1.5)
O2 Saturation: 65.1 %

## 2011-12-19 LAB — BASIC METABOLIC PANEL
BUN: 17 mg/dL (ref 6–23)
Creatinine, Ser: 0.86 mg/dL (ref 0.50–1.10)
GFR calc Af Amer: 70 mL/min — ABNORMAL LOW (ref >90)
GFR calc non Af Amer: 60 mL/min — ABNORMAL LOW (ref >90)

## 2011-12-19 LAB — LACTIC ACID, PLASMA: Lactic Acid, Venous: 3.5 mmol/L — ABNORMAL HIGH (ref 0.5–2.2)

## 2011-12-19 LAB — PREPARE RBC (CROSSMATCH)

## 2011-12-19 LAB — MRSA PCR SCREENING: MRSA by PCR: NEGATIVE

## 2011-12-19 SURGERY — IRRIGATION AND DEBRIDEMENT EXTREMITY
Anesthesia: General | Site: Leg Lower | Laterality: Right | Wound class: Clean

## 2011-12-19 MED ORDER — FUROSEMIDE 10 MG/ML IJ SOLN
INTRAMUSCULAR | Status: DC | PRN
  Administered 2011-12-19: 20 mg via INTRAMUSCULAR

## 2011-12-19 MED ORDER — FENTANYL CITRATE 0.05 MG/ML IJ SOLN
INTRAMUSCULAR | Status: AC
  Filled 2011-12-19: qty 2

## 2011-12-19 MED ORDER — ETOMIDATE 2 MG/ML IV SOLN
INTRAVENOUS | Status: AC | PRN
  Administered 2011-12-19: 20 mg via INTRAVENOUS

## 2011-12-19 MED ORDER — NOREPINEPHRINE BITARTRATE 1 MG/ML IJ SOLN
2.0000 ug/min | INTRAVENOUS | Status: DC
  Administered 2011-12-19: 10 ug/min via INTRAVENOUS
  Administered 2011-12-20: 35 ug/min via INTRAVENOUS
  Administered 2011-12-20: 25 ug/min via INTRAVENOUS
  Filled 2011-12-19 (×2): qty 4

## 2011-12-19 MED ORDER — MIDAZOLAM HCL 5 MG/ML IJ SOLN
2.0000 mg/h | INTRAMUSCULAR | Status: DC
  Administered 2011-12-19 – 2011-12-21 (×3): 2 mg/h via INTRAVENOUS
  Filled 2011-12-19 (×4): qty 10

## 2011-12-19 MED ORDER — MIDAZOLAM HCL 2 MG/2ML IJ SOLN
INTRAMUSCULAR | Status: AC
  Filled 2011-12-19: qty 4

## 2011-12-19 MED ORDER — FENTANYL CITRATE 0.05 MG/ML IJ SOLN
INTRAMUSCULAR | Status: DC | PRN
  Administered 2011-12-19: 100 ug via INTRAVENOUS

## 2011-12-19 MED ORDER — LEVOTHYROXINE SODIUM 100 MCG PO TABS
100.0000 ug | ORAL_TABLET | Freq: Every day | ORAL | Status: DC
  Administered 2011-12-20 – 2011-12-26 (×7): 100 ug
  Filled 2011-12-19 (×9): qty 1

## 2011-12-19 MED ORDER — TRANEXAMIC ACID 100 MG/ML IV SOLN
1000.0000 mg | Freq: Once | INTRAVENOUS | Status: AC
  Administered 2011-12-19 (×2): 1000 mg via INTRAVENOUS
  Filled 2011-12-19: qty 10

## 2011-12-19 MED ORDER — SODIUM BICARBONATE 8.4 % IV SOLN
INTRAVENOUS | Status: DC | PRN
  Administered 2011-12-19 (×3): 50 meq via INTRAVENOUS

## 2011-12-19 MED ORDER — SUCCINYLCHOLINE CHLORIDE 20 MG/ML IJ SOLN
INTRAMUSCULAR | Status: AC | PRN
  Administered 2011-12-19: 150 mg via INTRAVENOUS

## 2011-12-19 MED ORDER — DOPAMINE-DEXTROSE 3.2-5 MG/ML-% IV SOLN
INTRAVENOUS | Status: DC | PRN
  Administered 2011-12-19: 3 ug/kg/min via INTRAVENOUS

## 2011-12-19 MED ORDER — CEFAZOLIN SODIUM 1-5 GM-% IV SOLN
INTRAVENOUS | Status: DC | PRN
  Administered 2011-12-19: 2 g via INTRAVENOUS

## 2011-12-19 MED ORDER — PHENYLEPHRINE HCL 10 MG/ML IJ SOLN
30.0000 ug/min | Freq: Once | INTRAVENOUS | Status: DC
  Filled 2011-12-19 (×2): qty 1

## 2011-12-19 MED ORDER — CEFAZOLIN SODIUM-DEXTROSE 2-3 GM-% IV SOLR
2.0000 g | Freq: Once | INTRAVENOUS | Status: AC
  Administered 2011-12-20: 2 g via INTRAVENOUS
  Filled 2011-12-19: qty 50

## 2011-12-19 MED ORDER — SODIUM CHLORIDE 0.9 % IV SOLN
2.0000 g | Freq: Once | INTRAVENOUS | Status: AC
  Administered 2011-12-19: 2 g via INTRAVENOUS
  Filled 2011-12-19 (×2): qty 20

## 2011-12-19 MED ORDER — SODIUM BICARBONATE 8.4 % IV SOLN
INTRAVENOUS | Status: AC
  Administered 2011-12-19: 100 meq via INTRAVENOUS
  Filled 2011-12-19: qty 100

## 2011-12-19 MED ORDER — ROCURONIUM BROMIDE 50 MG/5ML IV SOLN
INTRAVENOUS | Status: AC
  Filled 2011-12-19: qty 2

## 2011-12-19 MED ORDER — ONDANSETRON HCL 4 MG PO TABS
4.0000 mg | ORAL_TABLET | Freq: Four times a day (QID) | ORAL | Status: DC | PRN
  Administered 2012-01-02: 4 mg via ORAL
  Filled 2011-12-19: qty 1

## 2011-12-19 MED ORDER — PANTOPRAZOLE SODIUM 40 MG PO TBEC: 40.0000 mg | DELAYED_RELEASE_TABLET | Freq: Every day | ORAL | Status: DC

## 2011-12-19 MED ORDER — EPINEPHRINE HCL 0.1 MG/ML IJ SOLN
INTRAMUSCULAR | Status: DC | PRN
  Administered 2011-12-19 (×5): 1 via INTRAVENOUS

## 2011-12-19 MED ORDER — TRANEXAMIC ACID 100 MG/ML IV SOLN
1000.0000 mg | Freq: Once | INTRAVENOUS | Status: AC
  Administered 2011-12-19: 1000 mg via INTRAVENOUS
  Filled 2011-12-19: qty 10

## 2011-12-19 MED ORDER — FENTANYL CITRATE 0.05 MG/ML IJ SOLN
INTRAMUSCULAR | Status: AC | PRN
  Administered 2011-12-19: 50 ug via INTRAVENOUS

## 2011-12-19 MED ORDER — LACTATED RINGERS IV SOLN
INTRAVENOUS | Status: DC | PRN
  Administered 2011-12-19: 17:00:00 via INTRAVENOUS

## 2011-12-19 MED ORDER — FENTANYL BOLUS VIA INFUSION
50.0000 ug | Freq: Four times a day (QID) | INTRAVENOUS | Status: DC | PRN
  Filled 2011-12-19: qty 100

## 2011-12-19 MED ORDER — SODIUM CHLORIDE 0.9 % IV SOLN
50.0000 ug/h | INTRAVENOUS | Status: DC
  Administered 2011-12-19: 50 ug/h via INTRAVENOUS
  Filled 2011-12-19 (×3): qty 50

## 2011-12-19 MED ORDER — POTASSIUM CHLORIDE IN NACL 20-0.45 MEQ/L-% IV SOLN
INTRAVENOUS | Status: DC
  Administered 2011-12-19: 100 mL via INTRAVENOUS
  Administered 2011-12-20: 12:00:00 via INTRAVENOUS
  Filled 2011-12-19 (×5): qty 1000

## 2011-12-19 MED ORDER — ETOMIDATE 2 MG/ML IV SOLN
INTRAVENOUS | Status: AC
  Filled 2011-12-19: qty 20

## 2011-12-19 MED ORDER — IOHEXOL 300 MG/ML  SOLN
100.0000 mL | Freq: Once | INTRAMUSCULAR | Status: AC | PRN
  Administered 2011-12-19: 100 mL via INTRAVENOUS

## 2011-12-19 MED ORDER — SODIUM BICARBONATE 8.4 % IV SOLN
100.0000 meq | Freq: Once | INTRAVENOUS | Status: AC
  Administered 2011-12-19: 100 meq via INTRAVENOUS
  Filled 2011-12-19: qty 100

## 2011-12-19 MED ORDER — SUCCINYLCHOLINE CHLORIDE 20 MG/ML IJ SOLN
INTRAMUSCULAR | Status: AC
  Filled 2011-12-19: qty 10

## 2011-12-19 MED ORDER — LIDOCAINE HCL (CARDIAC) 20 MG/ML IV SOLN
INTRAVENOUS | Status: AC
  Filled 2011-12-19: qty 5

## 2011-12-19 MED ORDER — LACTATED RINGERS IV BOLUS (SEPSIS): 1000.0000 mL | Freq: Four times a day (QID) | INTRAVENOUS | Status: DC | PRN

## 2011-12-19 MED ORDER — SODIUM CHLORIDE 0.9 % IR SOLN
Status: DC | PRN
  Administered 2011-12-19: 3000 mL

## 2011-12-19 MED ORDER — ROCURONIUM BROMIDE 100 MG/10ML IV SOLN
INTRAVENOUS | Status: DC | PRN
  Administered 2011-12-19: 50 mg via INTRAVENOUS

## 2011-12-19 MED ORDER — PANTOPRAZOLE SODIUM 40 MG IV SOLR
40.0000 mg | Freq: Every day | INTRAVENOUS | Status: DC
  Administered 2011-12-20 – 2011-12-23 (×4): 40 mg via INTRAVENOUS
  Filled 2011-12-19 (×4): qty 40

## 2011-12-19 MED ORDER — ONDANSETRON HCL 4 MG/2ML IJ SOLN: 4.0000 mg | Freq: Four times a day (QID) | INTRAMUSCULAR | Status: DC | PRN

## 2011-12-19 MED ORDER — MIDAZOLAM BOLUS VIA INFUSION
1.0000 mg | INTRAVENOUS | Status: DC | PRN
  Filled 2011-12-19: qty 2

## 2011-12-19 MED ORDER — MIDAZOLAM HCL 5 MG/5ML IJ SOLN
INTRAMUSCULAR | Status: AC | PRN
  Administered 2011-12-19: 2 mg via INTRAVENOUS

## 2011-12-19 SURGICAL SUPPLY — 52 items
APPLICATOR CHLORAPREP 3ML ORNG (MISCELLANEOUS) ×2 IMPLANT
BANDAGE ELASTIC 4 VELCRO ST LF (GAUZE/BANDAGES/DRESSINGS) ×1 IMPLANT
BANDAGE GAUZE ELAST BULKY 4 IN (GAUZE/BANDAGES/DRESSINGS) ×10 IMPLANT
BLADE SURG 10 STRL SS (BLADE) ×4 IMPLANT
BNDG COHESIVE 4X5 TAN STRL (GAUZE/BANDAGES/DRESSINGS) ×4 IMPLANT
BNDG GAUZE STRTCH 6 (GAUZE/BANDAGES/DRESSINGS) ×12 IMPLANT
BRUSH SCRUB DISP (MISCELLANEOUS) ×8 IMPLANT
CLOTH BEACON ORANGE TIMEOUT ST (SAFETY) ×4 IMPLANT
CONNECTOR Y ATS VAC SYSTEM (MISCELLANEOUS) ×1 IMPLANT
COVER SURGICAL LIGHT HANDLE (MISCELLANEOUS) ×6 IMPLANT
DRAIN PENROSE 3/4X12 (DRAIN) ×6 IMPLANT
DRAPE C-ARMOR (DRAPES) ×4 IMPLANT
DRAPE U-SHAPE 47X51 STRL (DRAPES) ×5 IMPLANT
DRSG ADAPTIC 3X8 NADH LF (GAUZE/BANDAGES/DRESSINGS) ×4 IMPLANT
DRSG MEPILEX BORDER 4X8 (GAUZE/BANDAGES/DRESSINGS) ×2 IMPLANT
DRSG PAD ABDOMINAL 8X10 ST (GAUZE/BANDAGES/DRESSINGS) ×2 IMPLANT
DRSG VAC ATS LRG SENSATRAC (GAUZE/BANDAGES/DRESSINGS) ×2 IMPLANT
ELECT CAUTERY BLADE 6.4 (BLADE) IMPLANT
ELECT REM PT RETURN 9FT ADLT (ELECTROSURGICAL)
ELECTRODE REM PT RTRN 9FT ADLT (ELECTROSURGICAL) IMPLANT
GLOVE BIO SURGEON STRL SZ7.5 (GLOVE) ×4 IMPLANT
GLOVE BIO SURGEON STRL SZ8 (GLOVE) ×4 IMPLANT
GLOVE BIOGEL PI IND STRL 7.5 (GLOVE) ×3 IMPLANT
GLOVE BIOGEL PI IND STRL 8 (GLOVE) ×3 IMPLANT
GLOVE BIOGEL PI INDICATOR 7.5 (GLOVE) ×1
GLOVE BIOGEL PI INDICATOR 8 (GLOVE) ×1
GLOVE ECLIPSE 7.5 STRL STRAW (GLOVE) ×3 IMPLANT
GOWN PREVENTION PLUS XLARGE (GOWN DISPOSABLE) ×4 IMPLANT
GOWN STRL NON-REIN LRG LVL3 (GOWN DISPOSABLE) ×8 IMPLANT
HANDPIECE INTERPULSE COAX TIP (DISPOSABLE)
KIT BASIN OR (CUSTOM PROCEDURE TRAY) ×4 IMPLANT
KIT ROOM TURNOVER OR (KITS) ×4 IMPLANT
MANIFOLD NEPTUNE II (INSTRUMENTS) ×4 IMPLANT
NS IRRIG 1000ML POUR BTL (IV SOLUTION) ×4 IMPLANT
PACK ORTHO EXTREMITY (CUSTOM PROCEDURE TRAY) ×4 IMPLANT
PAD ARMBOARD 7.5X6 YLW CONV (MISCELLANEOUS) ×8 IMPLANT
PAD NEG PRESSURE SENSATRAC (MISCELLANEOUS) ×2 IMPLANT
PADDING CAST COTTON 6X4 STRL (CAST SUPPLIES) ×4 IMPLANT
SET HNDPC FAN SPRY TIP SCT (DISPOSABLE) IMPLANT
SPONGE GAUZE 4X4 12PLY (GAUZE/BANDAGES/DRESSINGS) ×4 IMPLANT
SPONGE LAP 18X18 X RAY DECT (DISPOSABLE) ×5 IMPLANT
STOCKINETTE IMPERVIOUS 9X36 MD (GAUZE/BANDAGES/DRESSINGS) ×4 IMPLANT
SUT ETHILON 2 0 FS 18 (SUTURE) ×2 IMPLANT
SUT ETHILON 3 0 PS 1 (SUTURE) ×3 IMPLANT
SUT PDS AB 2-0 CT1 27 (SUTURE) IMPLANT
TOWEL OR 17X24 6PK STRL BLUE (TOWEL DISPOSABLE) ×4 IMPLANT
TOWEL OR 17X26 10 PK STRL BLUE (TOWEL DISPOSABLE) ×8 IMPLANT
TUBE ANAEROBIC SPECIMEN COL (MISCELLANEOUS) IMPLANT
TUBE CONNECTING 12X1/4 (SUCTIONS) ×4 IMPLANT
UNDERPAD 30X30 INCONTINENT (UNDERPADS AND DIAPERS) ×4 IMPLANT
WATER STERILE IRR 1000ML POUR (IV SOLUTION) ×4 IMPLANT
YANKAUER SUCT BULB TIP NO VENT (SUCTIONS) ×4 IMPLANT

## 2011-12-19 NOTE — Progress Notes (Signed)
22:25 Responded to request by nurse to visit with pt family at bedside.  Introduced myself to pt dtr outside room and offered pastoral presence and support.  Heard stories of pt life and losses.  Dtr composed and processing situation.  Pt dtr talked about loss of father to cancer 9 mnths ago, as well as other losses of close family.  Offered listening and support to dtr, and to be available further as needed.  She thanked me for my support and presence.  I will follow-up as needed or requested.   12/19/11 2300  Clinical Encounter Type  Visited With Family  Visit Type Spiritual support  Referral From Nurse  Spiritual Encounters  Spiritual Needs Emotional  Stress Factors  Family Stress Factors Major life changes
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
## 2011-12-19 NOTE — Preoperative (Signed)
Beta Blockers   Reason not to administer Beta Blockers:Not Applicable 

## 2011-12-19 NOTE — Procedures (Signed)
Hercules Hasler, MD, MPH, FACS Pager: [phone]  

## 2011-12-19 NOTE — Progress Notes (Signed)
14:30 relieved previous chaplain- Offered spiritual care presence to staff for support.  Pt intubated and sedated, worked with CSW to try to reach family contacts.  Tried to call pt church [facility], but no answer.  Pt heading for scans.  Please page if we can be of further support.

## 2011-12-19 NOTE — ED Notes (Signed)
1-2 nurses on patient room with trauma services, ED Doctor, and surgeon intermittent since patient arrived
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
## 2011-12-19 NOTE — Progress Notes (Signed)
Pt's daughter & grandson with ICU RNs/team at bedside. Spent discussing events since arrival to hospital & very critical state - they were very concerned but expressed understanding & appreciation.

## 2011-12-19 NOTE — Anesthesia Preprocedure Evaluation (Addendum)
Anesthesia Evaluation  Patient identified by MRN, date of birth, ID band Patient unresponsive    Reviewed: Unable to perform ROS - Chart review only  Airway       Dental   Pulmonary  Bilateral PTX, bilateral pulm contusions, intubated in ED + rhonchi         Cardiovascular hypertension, Pt. on medications +CHF Rhythm:Regular Rate:Normal  Hypotensive in ED, 3u PRBCs by ED staff   Neuro/Psych    GI/Hepatic GERD-  Medicated,  Endo/Other    Renal/GU      Musculoskeletal   Abdominal (+) + obese,   Peds  Hematology   Anesthesia Other Findings Intubated in ED  Reproductive/Obstetrics                          Anesthesia Physical Anesthesia Plan  ASA: IV and Emergent  Anesthesia Plan: General   Post-op Pain Management:    Induction: Inhalational  Airway Management Planned: Oral ETT  Additional Equipment: Arterial line  Intra-op Plan:   Post-operative Plan: Post-operative intubation/ventilation  Informed Consent:   Only emergency history available  Plan Discussed with: CRNA and Surgeon  Anesthesia Plan Comments: (Received emergently into OR, hypotensive, unresponsive and intubated.  PRBC resuscitation, A line, central access  )        Anesthesia Quick Evaluation

## 2011-12-19 NOTE — Anesthesia Procedure Notes (Addendum)
Date/Time: 12/19/2011 5:08 PM Performed by: Jerilee Hoh Pre-anesthesia Checklist: Patient identified, Emergency Drugs available, Suction available and Patient being monitored Patient Re-evaluated:Patient Re-evaluated prior to inductionOxygen Delivery Method: Circle system utilized Intubation Type: Inhalational induction with existing ETT Placement Confirmation: positive ETCO2 and breath sounds checked- equal and bilateral    RIJ cordis introducer:  CVP: Timeout, sterile prep, drape, FBP L subclavian/clavicle.  Trendelenburg position.  Unable to get trocar under clavicle, reprep and drape L neck (C-collar removed, head and neck kept neutral throughout)  trocar LIJ 1st pass with US guidance.  Cordis introducer placed over J wire. sterile dressing on.  Sandford Craze, MD

## 2011-12-19 NOTE — OR Nursing (Signed)
Assistance with OR record charting given by Timoteo Expose, RN.
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
## 2011-12-19 NOTE — ED Notes (Signed)
Trauma at bedside placing two chest tubes per Dr Janee Morn time out performed 1544.
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
## 2011-12-19 NOTE — Procedures (Signed)
Central Venous Catheter Insertion Procedure Note [name] [id number] [birth year]  Procedure: Insertion of Central Venous Catheter Indications: Assessment of intravascular volume and Drug and/or fluid administration  Procedure Details Consent: Unable to obtain consent because of emergent medical necessity. Time Out: Verified patient identification, verified procedure, site/side was marked, verified correct patient position, special equipment/implants available, medications/allergies/relevent history reviewed, required imaging and test results available.  Performed  Maximum sterile technique was used including antiseptics, cap, gloves, gown, hand hygiene, mask and sheet. Skin prep: Chlorhexidine; local anesthetic administered A antimicrobial bonded/coated triple lumen catheter was placed in the left internal jugular vein through the Cordis device placed by anesthesia.  Evaluation Blood flow good Complications: No apparent complications Patient did tolerate procedure well. Chest X-ray ordered to verify placement.  CXR: normal.  [name] 12/19/2011, 8:55 PM

## 2011-12-19 NOTE — H&P (Signed)
Continue resuscitation of hemorrhagic shock with PRBC, platelets.  Bleeding source most likely RLE with blood loss at scene.  Going to OR with Dr. Carola Frost.  Pulmonary continues will likely blossom and worsen pulmonary function. Patient examined and I agree with the assessment and plan  [name], MD, MPH, FACS Pager: [phone]  12/19/2011 7:27 PM

## 2011-12-19 NOTE — Progress Notes (Addendum)
Dr. Janee Morn with Trauma called as he was signing out to me due to blood in ETT in OR during ortho procedure & anesthesia was concerned.  I arrived to find the pt getting CPR & ATLS.  CT's bilateral on suction with minimal output.  ETT minimal blood.  Coarse BS bilaterally.  Abd obese but soft & not distended.  Ext degloving wound in wound vac w min blood.  L hand lac deep but no active bleeding.  Compressions stopped & strong femoral pulse felt.  SBP 200's with Aline & HR 100's .  CPR /ATLS held - BP OK rest of case. I re-reviewed CT scans & agree w bad rib fx & pulmon contusions but no HTX.  No solid organ injury no free fluid.  Neg FAST by Dr. Janee Morn.  Will cycle enzymes, ask Pulmon/CCM to see (d/w Dr. Marchelle Gearing), consider ECHO 2D r/o cardiac contusion, correct coagullopathy with Plavix (limited options), warm up, etc
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
## 2011-12-19 NOTE — ED Notes (Signed)
Patient arrived with ecchymosis bilateral shoulders, right forearm left hand light purple light blue in color.  Abrasions light red and ecchymosis light blue chest wall and abdomen.
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
## 2011-12-19 NOTE — Brief Op Note (Signed)
12/19/2011  6:37 PM  PATIENT:  [name]  78 y.o. female  PRE-OPERATIVE DIAGNOSIS:  Bilateral Leg Wounds, left hand lacerations, right foot first metatarsal fracture dislocation, right lateral malleolus fracture, left distal radius fracture  POST-OPERATIVE DIAGNOSIS:   Bilateral Leg Wounds, left hand lacerations, right first metatarsal fracture dislocation, right lateral malleolus fracture, left distal radius fracture  PROCEDURE:  Procedure(s) (LRB): 1. IRRIGATION AND DEBRIDEMENT EXTREMITY (Bilateral) with evacuation of hematoma left and primary closure 2. APPLICATION OF WOUND VAC (Right) 3. IRRIGATION AND DEBRIDEMENT EXTREMITY (Left) hand with exploration of tendons IF MCP joint and MF pip joint 4. CPR  SURGEON:  Surgeon(s) and Role: Panel 1:    * Budd Palmer, MD - Primary  Panel 2:    * Budd Palmer, MD - Primary  PHYSICIAN ASSISTANT: [name], [facility]  ANESTHESIA:   general  EBL:  Total I/O In: 1050 [Blood:1050] Out: -   DRAINS: Penrose drain in the left leg   LOCAL MEDICATIONS USED:  NONE  SPECIMEN:  No Specimen  DISPOSITION OF SPECIMEN:  N/A  COUNTS:  YES  TOURNIQUET:  * No tourniquets in log *  DICTATION: .Other Dictation: Dictation Number [id number]  PLAN OF CARE: ICU  PATIENT DISPOSITION:  ICU - intubated and critically ill.   Delay start of Pharmacological VTE agent (>24hrs) due to surgical blood loss or risk of bleeding: yes
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
## 2011-12-20 ENCOUNTER — Inpatient Hospital Stay (HOSPITAL_COMMUNITY): Payer: Medicare Other

## 2011-12-20 ENCOUNTER — Encounter (HOSPITAL_COMMUNITY): Payer: Self-pay | Admitting: Orthopedic Surgery

## 2011-12-20 DIAGNOSIS — F411 Generalized anxiety disorder: Secondary | ICD-10-CM

## 2011-12-20 DIAGNOSIS — I359 Nonrheumatic aortic valve disorder, unspecified: Secondary | ICD-10-CM

## 2011-12-20 DIAGNOSIS — K449 Diaphragmatic hernia without obstruction or gangrene: Secondary | ICD-10-CM

## 2011-12-20 LAB — BASIC METABOLIC PANEL
BUN: 19 mg/dL (ref 6–23)
BUN: 22 mg/dL (ref 6–23)
BUN: 26 mg/dL — ABNORMAL HIGH (ref 6–23)
CO2: 20 mEq/L (ref 19–32)
Calcium: 7 mg/dL — ABNORMAL LOW (ref 8.4–10.5)
Calcium: 7.3 mg/dL — ABNORMAL LOW (ref 8.4–10.5)
Chloride: 113 mEq/L — ABNORMAL HIGH (ref 96–112)
Chloride: 109 mEq/L (ref 96–112)
Creatinine, Ser: 1.28 mg/dL — ABNORMAL HIGH (ref 0.50–1.10)
GFR calc Af Amer: 43 mL/min — ABNORMAL LOW (ref >90)
GFR calc Af Amer: 32 mL/min — ABNORMAL LOW (ref >90)
GFR calc non Af Amer: 37 mL/min — ABNORMAL LOW (ref >90)
GFR calc non Af Amer: 27 mL/min — ABNORMAL LOW (ref >90)
Glucose, Bld: 150 mg/dL — ABNORMAL HIGH (ref 70–99)
Potassium: 4.1 mEq/L (ref 3.5–5.1)
Potassium: 4.6 mEq/L (ref 3.5–5.1)
Sodium: 146 mEq/L — ABNORMAL HIGH (ref 135–145)
Sodium: 143 mEq/L (ref 135–145)

## 2011-12-20 LAB — POCT I-STAT 7, (LYTES, BLD GAS, ICA,H+H)
Acid-base deficit: 13 mmol/L — ABNORMAL HIGH (ref 0.0–2.0)
Acid-base deficit: 17 mmol/L — ABNORMAL HIGH (ref 0.0–2.0)
Bicarbonate: 16 mEq/L — ABNORMAL LOW (ref 20.0–24.0)
Calcium, Ion: 0.63 mmol/L — CL (ref 1.12–1.32)
HCT: 26 % — ABNORMAL LOW (ref 36.0–46.0)
HCT: 30 % — ABNORMAL LOW (ref 36.0–46.0)
Hemoglobin: 8.8 g/dL — ABNORMAL LOW (ref 12.0–15.0)
Hemoglobin: 10.2 g/dL — ABNORMAL LOW (ref 12.0–15.0)
Sodium: 146 mEq/L — ABNORMAL HIGH (ref 135–145)
Sodium: 141 mEq/L (ref 135–145)
TCO2: 18 mmol/L (ref 0–100)
pH, Arterial: 6.904 — CL (ref 7.350–7.400)
pO2, Arterial: 106 mmHg — ABNORMAL HIGH (ref 80.0–100.0)
pO2, Arterial: 72 mmHg — ABNORMAL LOW (ref 80.0–100.0)

## 2011-12-20 LAB — CBC
HCT: 24.9 % — ABNORMAL LOW (ref 36.0–46.0)
HCT: 25.1 % — ABNORMAL LOW (ref 36.0–46.0)
Hemoglobin: 9 g/dL — ABNORMAL LOW (ref 12.0–15.0)
MCHC: 35.3 g/dL (ref 30.0–36.0)
MCHC: 35.7 g/dL (ref 30.0–36.0)
MCHC: 35.9 g/dL (ref 30.0–36.0)
MCV: 87.1 fL (ref 78.0–100.0)
MCV: 86.3 fL (ref 78.0–100.0)
Platelets: 75 10*3/uL — ABNORMAL LOW (ref 150–400)
Platelets: 72 10*3/uL — ABNORMAL LOW (ref 150–400)
RDW: 16.2 % — ABNORMAL HIGH (ref 11.5–15.5)
RDW: 16.6 % — ABNORMAL HIGH (ref 11.5–15.5)
WBC: 18 10*3/uL — ABNORMAL HIGH (ref 4.0–10.5)
WBC: 19.5 10*3/uL — ABNORMAL HIGH (ref 4.0–10.5)

## 2011-12-20 LAB — POCT I-STAT 3, ART BLOOD GAS (G3+)
Acid-base deficit: 6 mmol/L — ABNORMAL HIGH (ref 0.0–2.0)
Acid-base deficit: 8 mmol/L — ABNORMAL HIGH (ref 0.0–2.0)
Bicarbonate: 17.3 mEq/L — ABNORMAL LOW (ref 20.0–24.0)
O2 Saturation: 94 %
O2 Saturation: 94 %
TCO2: 20 mmol/L (ref 0–100)
TCO2: 20 mmol/L (ref 0–100)
pCO2 arterial: 34.9 mmHg — ABNORMAL LOW (ref 35.0–45.0)
pCO2 arterial: 34.9 mmHg — ABNORMAL LOW (ref 35.0–45.0)
pCO2 arterial: 37 mmHg (ref 35.0–45.0)
pH, Arterial: 7.311 — ABNORMAL LOW (ref 7.350–7.400)
pO2, Arterial: 76 mmHg — ABNORMAL LOW (ref 80.0–100.0)
pO2, Arterial: 79 mmHg — ABNORMAL LOW (ref 80.0–100.0)
pO2, Arterial: 80 mmHg (ref 80.0–100.0)

## 2011-12-20 LAB — COMPREHENSIVE METABOLIC PANEL
AST: 183 U/L — ABNORMAL HIGH (ref 0–37)
Alkaline Phosphatase: 36 U/L — ABNORMAL LOW (ref 39–117)
BUN: 20 mg/dL (ref 6–23)
CO2: 16 mEq/L — ABNORMAL LOW (ref 19–32)
Chloride: 110 mEq/L (ref 96–112)
Creatinine, Ser: 1.49 mg/dL — ABNORMAL HIGH (ref 0.50–1.10)
GFR calc non Af Amer: 31 mL/min — ABNORMAL LOW (ref >90)
Potassium: 4.1 mEq/L (ref 3.5–5.1)
Total Bilirubin: 0.5 mg/dL (ref 0.3–1.2)

## 2011-12-20 LAB — CARDIAC PANEL(CRET KIN+CKTOT+MB+TROPI)
CK, MB: 18 ng/mL (ref 0.3–4.0)
CK, MB: 18.8 ng/mL (ref 0.3–4.0)
Relative Index: 2.3 (ref 0.0–2.5)
Total CK: 662 U/L — ABNORMAL HIGH (ref 7–177)

## 2011-12-20 LAB — LACTIC ACID, PLASMA: Lactic Acid, Venous: 8.3 mmol/L — ABNORMAL HIGH (ref 0.5–2.2)

## 2011-12-20 LAB — GLUCOSE, CAPILLARY

## 2011-12-20 MED ORDER — BIOTENE DRY MOUTH MT LIQD
15.0000 mL | Freq: Four times a day (QID) | OROMUCOSAL | Status: DC
  Administered 2011-12-21 – 2011-12-30 (×38): 15 mL via OROMUCOSAL

## 2011-12-20 MED ORDER — CHLORHEXIDINE GLUCONATE 0.12 % MT SOLN
OROMUCOSAL | Status: AC
  Administered 2011-12-20: 15 mL via OROMUCOSAL
  Filled 2011-12-20: qty 15

## 2011-12-20 MED ORDER — FUROSEMIDE 10 MG/ML IJ SOLN
4.0000 mg/h | INTRAVENOUS | Status: DC
  Filled 2011-12-20: qty 25

## 2011-12-20 MED ORDER — NOREPINEPHRINE BITARTRATE 1 MG/ML IJ SOLN
2.0000 ug/min | INTRAVENOUS | Status: DC
  Administered 2011-12-20: 35 ug/min via INTRAVENOUS
  Filled 2011-12-20: qty 16

## 2011-12-20 MED ORDER — STUDY - INVESTIGATIONAL DRUG SIMPLE RECORD
20.0000 mg | Freq: Every day | Status: DC
  Filled 2011-12-20: qty 20

## 2011-12-20 MED ORDER — PIVOT 1.5 CAL PO LIQD
1000.0000 mL | ORAL | Status: DC
  Administered 2011-12-20 (×2): 1000 mL
  Filled 2011-12-20 (×2): qty 1000

## 2011-12-20 MED ORDER — SODIUM CHLORIDE 0.9 % IV SOLN
INTRAVENOUS | Status: DC
  Administered 2011-12-20 – 2011-12-21 (×2): via INTRAVENOUS
  Administered 2011-12-21: 15 mL/h via INTRAVENOUS

## 2011-12-20 MED ORDER — MAGNESIUM SULFATE 40 MG/ML IJ SOLN
2.0000 g | Freq: Once | INTRAMUSCULAR | Status: AC
  Administered 2011-12-20: 2 g via INTRAVENOUS
  Filled 2011-12-20: qty 50

## 2011-12-20 MED ORDER — FUROSEMIDE 10 MG/ML IJ SOLN
20.0000 mg | INTRAMUSCULAR | Status: DC | PRN
  Filled 2011-12-20 (×2): qty 8

## 2011-12-20 MED ORDER — INSULIN ASPART 100 UNIT/ML ~~LOC~~ SOLN
0.0000 [IU] | SUBCUTANEOUS | Status: DC
  Administered 2011-12-22: 1 [IU] via SUBCUTANEOUS
  Administered 2011-12-24: 2 [IU] via SUBCUTANEOUS
  Administered 2011-12-24: 1 [IU] via SUBCUTANEOUS
  Administered 2011-12-24 – 2011-12-25 (×3): 2 [IU] via SUBCUTANEOUS
  Administered 2011-12-25 (×3): 1 [IU] via SUBCUTANEOUS
  Administered 2011-12-26: 2 [IU] via SUBCUTANEOUS
  Administered 2011-12-26 – 2011-12-28 (×5): 1 [IU] via SUBCUTANEOUS
  Administered 2011-12-29: 2 [IU] via SUBCUTANEOUS
  Administered 2011-12-29 – 2012-01-02 (×8): 1 [IU] via SUBCUTANEOUS

## 2011-12-20 MED ORDER — STUDY - INVESTIGATIONAL DRUG SIMPLE RECORD
40.0000 mg | Freq: Once | Status: AC
  Administered 2011-12-20: 40 mg via ORAL
  Filled 2011-12-20: qty 40

## 2011-12-20 MED ORDER — ACETAMINOPHEN 160 MG/5ML PO SOLN
650.0000 mg | Freq: Four times a day (QID) | ORAL | Status: DC | PRN
  Administered 2011-12-22 – 2011-12-25 (×5): 650 mg via ORAL
  Filled 2011-12-20 (×6): qty 20.3

## 2011-12-20 MED ORDER — CEFAZOLIN SODIUM 1-5 GM-% IV SOLN
1.0000 g | Freq: Two times a day (BID) | INTRAVENOUS | Status: AC
  Administered 2011-12-20 – 2011-12-21 (×2): 1 g via INTRAVENOUS
  Filled 2011-12-20 (×2): qty 50

## 2011-12-20 MED ORDER — HETASTARCH-ELECTROLYTES 6 % IV SOLN
500.0000 mL | Freq: Once | INTRAVENOUS | Status: AC
  Administered 2011-12-20: 500 mL via INTRAVENOUS
  Filled 2011-12-20: qty 500

## 2011-12-20 MED ORDER — CHLORHEXIDINE GLUCONATE 0.12 % MT SOLN
15.0000 mL | Freq: Two times a day (BID) | OROMUCOSAL | Status: DC
  Administered 2011-12-20 – 2011-12-30 (×19): 15 mL via OROMUCOSAL
  Filled 2011-12-20 (×18): qty 15
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
## 2011-12-20 NOTE — Research (Signed)
[facility] clinical research study-Statins for Acutely Injured Lungs from Sepsis discussed with patient's daughter.  All questions and concerns were addressed and answered prior to obtaining informed consent.  No study procedures were initiated prior to obtaining informed consent.  A copy of the signed consent was provided to the patient's LAR.  Please contact CCM physician or study coordinator at [phone] with any questions or concerns. [name] pressor was weaned to off at approximately 1445 and therefore no fluid management will commence until 12 hrs off pressors or last fluid bolus given.

## 2011-12-20 NOTE — Progress Notes (Signed)
  Echocardiogram 2D Echocardiogram has been performed.  [name] [facility] 12/20/2011, 10:19 AM

## 2011-12-20 NOTE — Progress Notes (Signed)
Patient's mouth cleaned with chlorhexidne gluconate, subglottic suction hooked to LIWS, and initial VAP audit completed.

## 2011-12-20 NOTE — Op Note (Signed)
[name]               ACCOUNT NO.:  [id number]  MEDICAL RECORD NO.:  [id number]  LOCATION:  2313                         FACILITY:  [facility]  PHYSICIAN:  Doralee Albino. Carola Frost, M.D. DATE OF BIRTH:  06-28-1925  DATE OF PROCEDURE:  12/19/2011 DATE OF DISCHARGE:                              OPERATIVE REPORT   PREOPERATIVE DIAGNOSES: 1. Bilateral leg wounds with degloving. 2. Left hand lacerations over the index finger metacarpophalangeal     joint, long finger proximal phalanx and proximal interphalangeal     joint, right foot first metatarsal fracture dislocation, right     lateral malleolus fracture, left distal radius fracture.  POSTOPERATIVE DIAGNOSES: 1. Bilateral leg wounds with degloving. 2. Left hand lacerations over the index finger metacarpophalangeal     joint, long finger proximal phalanx and proximal interphalangeal     joint, right foot first metatarsal fracture dislocation, right     lateral malleolus fracture, left distal radius fracture.  PROCEDURES: 1. Exploration, irrigation and debridement of complex, extensive open wound, with evacuation of hematoma, right knee and knee extending into the thigh and calf with few areas of loose primary closure     and application of a large wound VAC. 2. Exploration, irrigation and debridement of complex deep wound and associated skin degloving, left calf. 3. Irrigation and exploration of the left hand, index finger MCP     joint, the MCP area. 4. I and D of left long finger PIP and proximal phalanx dorsal wound     with again exploration. 5. Closed treatment of left distal radius. 6. Closed treatment of right lateral malleolus. 7. Cardiopulmonary resuscitation.  SURGEON:  Doralee Albino. Carola Frost, MD  ASSISTANT:  Mearl Latin, PA-C  ANESTHESIA:  Germaine Pomfret, MD, general.  EBL:  60 mL.  BLOOD ADMINISTERED:  1050 mL.  DRAINS:  Two Penrose from the left leg wound, large VAC from the right leg wound and knee  area.  DISPOSITION:  To the ICU.  CONDITION:  Critical.  BRIEF SUMMARY OF INDICATION FOR PROCEDURE:  [name] is an 78 year old female with multiple medical problems including congestive heart failure, coronary artery disease, peripheral vascular disease, and other vasculopathy who required intubation and placement of bilateral chest tubes for small pneumothoraces and at that time. Recommendation was made by Trauma Service to proceed to the OR for control of the leg wounds, which were  resulting in continuing blood loss aggravated by the patient's anticoagulation.  At the time of transfer to the OR under emergent circumstances, the patient's lactic acid had come back at 3.5, which was elevated and we were concerned about her difficulty of maintaining adequate blood pressure.  The patient was receiving units in transit and applying entry to the OR, the resuscitation efforts continued under Dr. Jean Rosenthal.  BRIEF SUMMARY OF PROCEDURE:  The patient did receive 2 g of Ancef.  The lower extremities were prepped for treatment of her bleeding leg wounds only as we did not anticipate performing any further procedures or orthopedic intervention until she was stabilized adequately, hemodynamically.  Both wounds were washed out thoroughly, but in rapid fashion, finding the degloving to extend from the calf above the  knee and into the thigh on both the medial and lateral sides.  After evacuation of the hematoma, a wound VAC sponge was placed along the medial lateral gutters and a loose closure with far-near and near-far sutures made on the right.  On the left, again rapid irrigation and closer over two Penrose drains.  At that time, the patient who had undergone placement of a radial arterial line on the right and a central line brady down into asystole.  We began CPR with compressions and we were able to achieve profusion and O2 saturation.  This continued over 15 minutes and a detailed  account is in separate dictation by Dr. Jean Rosenthal.  The General Trauma Service surgeon Dr. Michaell Cowing did appear as well for further intraoperative management.  Ultimately, we were able to reestablish spontaneous rhythm and perfusion.  [name] left hand wounds were explored and irrigated thoroughly not finding any deep penetration into the joints or foreign bodies.  Sterile dressings were applied, and in fact, all of the Orthopedic dressings were done concurrently with resuscitation so as not to in any way impede or delay that effort or transfer to the unit.  The patient ultimately received 4 units in the OR as well as platelets and other blood products.  She was transported in critical condition to the SICU.  All counts were correct at the end of the case and a large wound VAC again had been applied to the right leg.  The right first metatarsal fracture dislocation in the foot was reduced closed and splinted along with the fibula and a PRAFO device.  [name], PA-C assisted me throughout the procedure and was necessary to rapidly care for this critically ill patient.  PROGNOSIS:  [name] prognosis remains grave given her multiple and severe comorbidities and pulmonary contusions exacerbated by her more prolonged CPR.  We will continue to follow the patient for her orthopedic conditions and if necessary, we will consider skin grafting when appropriate.  We do not anticipate operative treatment for her remaining fractures.     Doralee Albino. Carola Frost, M.D.     [facility]/MEDQ  D:  12/19/2011  T:  12/20/2011  Job:  [id number]

## 2011-12-20 NOTE — Anesthesia Postprocedure Evaluation (Deleted)
  Anesthesia Post-op Note  Patient: [name]  Procedure(s) Performed: Procedure(s) (LRB): IRRIGATION AND DEBRIDEMENT EXTREMITY (Bilateral) APPLICATION OF WOUND VAC (Right) IRRIGATION AND DEBRIDEMENT EXTREMITY (Left)  Patient Location: PACU  Anesthesia Type: General  Level of Consciousness: awake  Airway and Oxygen Therapy: Patient Spontanous Breathing  Post-op Pain: mild  Post-op Assessment: Post-op Vital signs reviewed, Patient's Cardiovascular Status Stable, Respiratory Function Stable, Patent Airway, No signs of Nausea or vomiting and Pain level controlled  Post-op Vital Signs: stable  Complications: No apparent anesthesia complications

## 2011-12-20 NOTE — Progress Notes (Signed)
12/20/11 11:50 In to see patient no family present. Jim Like RN CCM [facility]

## 2011-12-20 NOTE — Progress Notes (Signed)
Patient ID: [name], female   DOB: [birth year], 78 y.o.   MRN: [id number]   Bedside limited echo for pericardial effusion: No significant effusion.  Grossly normal EF.  Difficult study due to patient position and ventilation.  Would get complete echo in am.   [name] Ancona 12/20/2011 12:57 AM
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
## 2011-12-21 ENCOUNTER — Inpatient Hospital Stay (HOSPITAL_COMMUNITY): Payer: Medicare Other

## 2011-12-21 ENCOUNTER — Telehealth (INDEPENDENT_AMBULATORY_CARE_PROVIDER_SITE_OTHER): Payer: Self-pay

## 2011-12-21 DIAGNOSIS — S270XXA Traumatic pneumothorax, initial encounter: Secondary | ICD-10-CM

## 2011-12-21 DIAGNOSIS — T794XXA Traumatic shock, initial encounter: Secondary | ICD-10-CM

## 2011-12-21 DIAGNOSIS — S8261XA Displaced fracture of lateral malleolus of right fibula, initial encounter for closed fracture: Secondary | ICD-10-CM

## 2011-12-21 DIAGNOSIS — J95821 Acute postprocedural respiratory failure: Secondary | ICD-10-CM

## 2011-12-21 DIAGNOSIS — S2249XA Multiple fractures of ribs, unspecified side, initial encounter for closed fracture: Secondary | ICD-10-CM

## 2011-12-21 DIAGNOSIS — S92902A Unspecified fracture of left foot, initial encounter for closed fracture: Secondary | ICD-10-CM

## 2011-12-21 DIAGNOSIS — S52502A Unspecified fracture of the lower end of left radius, initial encounter for closed fracture: Secondary | ICD-10-CM

## 2011-12-21 DIAGNOSIS — S42033A Displaced fracture of lateral end of unspecified clavicle, initial encounter for closed fracture: Secondary | ICD-10-CM

## 2011-12-21 DIAGNOSIS — N179 Acute kidney failure, unspecified: Secondary | ICD-10-CM

## 2011-12-21 DIAGNOSIS — S8990XA Unspecified injury of unspecified lower leg, initial encounter: Secondary | ICD-10-CM

## 2011-12-21 LAB — BASIC METABOLIC PANEL
BUN: 39 mg/dL — ABNORMAL HIGH (ref 6–23)
Calcium: 6.7 mg/dL — ABNORMAL LOW (ref 8.4–10.5)
Calcium: 7 mg/dL — ABNORMAL LOW (ref 8.4–10.5)
Creatinine, Ser: 2.76 mg/dL — ABNORMAL HIGH (ref 0.50–1.10)
GFR calc Af Amer: 15 mL/min — ABNORMAL LOW (ref >90)
GFR calc non Af Amer: 15 mL/min — ABNORMAL LOW (ref >90)
GFR calc non Af Amer: 13 mL/min — ABNORMAL LOW (ref >90)
Glucose, Bld: 158 mg/dL — ABNORMAL HIGH (ref 70–99)
Potassium: 3.8 mEq/L (ref 3.5–5.1)
Sodium: 142 mEq/L (ref 135–145)

## 2011-12-21 LAB — PREPARE FRESH FROZEN PLASMA
Unit division: 0
Unit division: 0
Unit division: 0

## 2011-12-21 LAB — CBC
HCT: 26 % — ABNORMAL LOW (ref 36.0–46.0)
Hemoglobin: 9.6 g/dL — ABNORMAL LOW (ref 12.0–15.0)
Hemoglobin: 9.7 g/dL — ABNORMAL LOW (ref 12.0–15.0)
MCH: 31.6 pg (ref 26.0–34.0)
MCH: 30.6 pg (ref 26.0–34.0)
MCHC: 36.9 g/dL — ABNORMAL HIGH (ref 30.0–36.0)
Platelets: 56 10*3/uL — ABNORMAL LOW (ref 150–400)
RBC: 3.17 MIL/uL — ABNORMAL LOW (ref 3.87–5.11)
RDW: 15.2 % (ref 11.5–15.5)
WBC: 10.6 10*3/uL — ABNORMAL HIGH (ref 4.0–10.5)

## 2011-12-21 LAB — GLUCOSE, CAPILLARY
Glucose-Capillary: 63 mg/dL — ABNORMAL LOW (ref 70–99)
Glucose-Capillary: 78 mg/dL (ref 70–99)
Glucose-Capillary: 82 mg/dL (ref 70–99)
Glucose-Capillary: 95 mg/dL (ref 70–99)

## 2011-12-21 LAB — POCT I-STAT 3, ART BLOOD GAS (G3+)
Acid-base deficit: 4 mmol/L — ABNORMAL HIGH (ref 0.0–2.0)
Bicarbonate: 20.1 mEq/L (ref 20.0–24.0)
Patient temperature: 37.6
TCO2: 21 mmol/L (ref 0–100)

## 2011-12-21 LAB — CK: Total CK: 1041 U/L — ABNORMAL HIGH (ref 7–177)

## 2011-12-21 LAB — HEMOGLOBIN AND HEMATOCRIT, BLOOD: Hemoglobin: 6.9 g/dL — CL (ref 12.0–15.0)

## 2011-12-21 LAB — APTT: aPTT: 36 seconds (ref 24–37)

## 2011-12-21 LAB — C-REACTIVE PROTEIN: CRP: 9.21 mg/dL — ABNORMAL HIGH (ref <0.60)

## 2011-12-21 LAB — PROTIME-INR: INR: 1.66 — ABNORMAL HIGH (ref 0.00–1.49)

## 2011-12-21 MED ORDER — NEPRO/CARBSTEADY PO LIQD
1000.0000 mL | ORAL | Status: DC
  Administered 2011-12-21: 1000 mL
  Filled 2011-12-21: qty 1000

## 2011-12-21 MED ORDER — KCL IN DEXTROSE-NACL 20-5-0.9 MEQ/L-%-% IV SOLN
INTRAVENOUS | Status: DC
  Administered 2011-12-21: 50 mL/h via INTRAVENOUS
  Filled 2011-12-21: qty 1000

## 2011-12-21 MED ORDER — DEXTROSE 50 % IV SOLN
25.0000 mL | Freq: Once | INTRAVENOUS | Status: AC | PRN
  Administered 2011-12-21 (×2): 25 mL via INTRAVENOUS

## 2011-12-21 MED ORDER — DEXTROSE 10 % IV SOLN
INTRAVENOUS | Status: DC
  Administered 2011-12-21: 21:00:00 via INTRAVENOUS

## 2011-12-21 MED ORDER — DEXTROSE 50 % IV SOLN
INTRAVENOUS | Status: AC
  Administered 2011-12-21: 20:00:00
  Filled 2011-12-21: qty 50

## 2011-12-21 MED ORDER — NEPRO/CARBSTEADY PO LIQD
1000.0000 mL | ORAL | Status: DC
  Administered 2011-12-21 – 2011-12-26 (×4): 1000 mL
  Filled 2011-12-21 (×7): qty 1000

## 2011-12-21 MED ORDER — DEXTROSE 50 % IV SOLN
INTRAVENOUS | Status: AC
  Administered 2011-12-21: 25 mL via INTRAVENOUS
  Filled 2011-12-21: qty 50

## 2011-12-21 MED ORDER — FUROSEMIDE 10 MG/ML IJ SOLN
80.0000 mg | Freq: Two times a day (BID) | INTRAMUSCULAR | Status: DC
  Administered 2011-12-21 (×2): 80 mg via INTRAVENOUS
  Filled 2011-12-21 (×4): qty 8

## 2011-12-21 NOTE — Telephone Encounter (Signed)
Dr. Lindie Spruce paged to call Dr. Darrol Angel @ [phone].

## 2011-12-21 NOTE — Progress Notes (Signed)
Follow-up visit per nurse request.  Visited with pt sister and niece, offered pastoral support and presence at bedside.  Pt niece states pt and pt sister are "very religious", and would appreciate prayer. Offered prayer with sister at bedside, which sister appreciated.  Offered to be available as needed.  Pt sister thanked me for my visit and prayer.

## 2011-12-21 NOTE — Progress Notes (Signed)
CBG: 64  Treatment: D50 IV 25 mL  Symptoms: None  Follow-up CBG: Time:2030 CBG Result:61  Possible Reasons for Event: Unknown  Comments/MD notified:Dr Wyatt, gave1 am D5, CHANGED IVE FLUIDS , THEN INCREASED NEPRO    Alonzo Loving O
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
## 2011-12-21 NOTE — Progress Notes (Signed)
Orthopedic Tech Progress Note Patient Details:  [name] 1925/03/04 [id number]  Ortho Devices Type of Ortho Device: Ankle Air splint Ortho Device/Splint Location: lefty ankle Ortho Device/Splint Interventions: Application   Violetta Lavalle 12/21/2011, 1:26 PM
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
## 2011-12-21 NOTE — Progress Notes (Signed)
CRITICAL VALUE ALERT  Critical value received:  hgb 6.9  Date of notification:  12/21/11  Time of notification:  0015  Critical value read back:yes  Nurse who received alert:  Tillie Fantasia   MD notified (1st page):  Deterding  Time of first page:  0015  MD notified (2nd page):  Time of second page:  Responding MD:  Deterding  Time MD responded:  [phone]

## 2011-12-21 NOTE — Progress Notes (Signed)
3 episodes of SVT non sustained. Also noted T wave depression on monitor with wide QRS complexes. Unsure how new this is. Lasix 80mg  every 12 hours started today. Urinary output 410 cc in past 12 hours. Will notify Dr Lindie Spruce for input, while asking for electrolyte panel and magnesium. As of 6 am, labs are as follows    Results for [name], [name] (MRN [id number]) as of 12/21/2011 22:12  Ref. Range 12/21/2011 06:05  Sodium Latest Range: 135-145 mEq/L 142  Potassium Latest Range: 3.5-5.1 mEq/L 4.0  Chloride Latest Range: 96-112 mEq/L 110  CO2 Latest Range: 19-32 mEq/L 20  BUN Latest Range: 6-23 mg/dL 39 (H)  Creat Latest Range: 0.50-1.10 mg/dL 4.09 (H)  Calcium Latest Range: 8.4-10.5 mg/dL 6.7 (L)  GFR calc non Af Amer Latest Range: >90 mL/min 15 (L)  GFR calc Af Amer Latest Range: >90 mL/min 17 (L)  Glucose Latest Range: 70-99 mg/dL 811 (H)
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
## 2011-12-21 NOTE — Progress Notes (Signed)
Inpatient Diabetes Program Recommendations  AACE/ADA: New Consensus Statement on Inpatient Glycemic Control (2009)  Target Ranges:  Prepandial:   less than 140 mg/dL      Peak postprandial:   less than 180 mg/dL (1-2 hours)      Critically ill patients:  140 - 180 mg/dL   Reason for Visit: Results for AUDA, FINFROCK (MRN [id number]) as of 12/21/2011 14:07  Ref. Range 12/21/2011 04:38 12/21/2011 05:11 12/21/2011 07:57 12/21/2011 11:54  Glucose-Capillary Latest Range: 70-99 mg/dL 73 82 95 80    Inpatient Diabetes Program Recommendations Correction (SSI): Hold Novolog sensitive correction-

## 2011-12-22 ENCOUNTER — Inpatient Hospital Stay (HOSPITAL_COMMUNITY): Payer: Medicare Other

## 2011-12-22 DIAGNOSIS — S8252XA Displaced fracture of medial malleolus of left tibia, initial encounter for closed fracture: Secondary | ICD-10-CM

## 2011-12-22 LAB — POCT I-STAT 3, ART BLOOD GAS (G3+)
Acid-base deficit: 2 mmol/L (ref 0.0–2.0)
Bicarbonate: 21.1 mEq/L (ref 20.0–24.0)
Patient temperature: 37.3
pH, Arterial: 7.474 — ABNORMAL HIGH (ref 7.350–7.400)

## 2011-12-22 LAB — PREPARE PLATELET PHERESIS: Unit division: 0

## 2011-12-22 LAB — CBC
HCT: 23.3 % — ABNORMAL LOW (ref 36.0–46.0)
HCT: 24 % — ABNORMAL LOW (ref 36.0–46.0)
Hemoglobin: 8.4 g/dL — ABNORMAL LOW (ref 12.0–15.0)
MCH: 31.2 pg (ref 26.0–34.0)
MCH: 30.7 pg (ref 26.0–34.0)
MCHC: 35 g/dL (ref 30.0–36.0)
MCV: 86.6 fL (ref 78.0–100.0)
Platelets: 42 10*3/uL — ABNORMAL LOW (ref 150–400)
RDW: 16.1 % — ABNORMAL HIGH (ref 11.5–15.5)
RDW: 16.4 % — ABNORMAL HIGH (ref 11.5–15.5)
WBC: 8.8 10*3/uL (ref 4.0–10.5)

## 2011-12-22 LAB — BASIC METABOLIC PANEL
BUN: 52 mg/dL — ABNORMAL HIGH (ref 6–23)
Calcium: 7.2 mg/dL — ABNORMAL LOW (ref 8.4–10.5)
Chloride: 107 mEq/L (ref 96–112)
Creatinine, Ser: 3.33 mg/dL — ABNORMAL HIGH (ref 0.50–1.10)
GFR calc Af Amer: 14 mL/min — ABNORMAL LOW (ref >90)

## 2011-12-22 LAB — DIFFERENTIAL
Basophils Relative: 0 % (ref 0–1)
Eosinophils Absolute: 0.3 10*3/uL (ref 0.0–0.7)
Eosinophils Relative: 4 % (ref 0–5)
Lymphs Abs: 1.1 10*3/uL (ref 0.7–4.0)
Monocytes Relative: 6 % (ref 3–12)
Neutrophils Relative %: 78 % — ABNORMAL HIGH (ref 43–77)

## 2011-12-22 LAB — GLUCOSE, CAPILLARY
Glucose-Capillary: 112 mg/dL — ABNORMAL HIGH (ref 70–99)
Glucose-Capillary: 113 mg/dL — ABNORMAL HIGH (ref 70–99)
Glucose-Capillary: 114 mg/dL — ABNORMAL HIGH (ref 70–99)
Glucose-Capillary: 61 mg/dL — ABNORMAL LOW (ref 70–99)
Glucose-Capillary: 69 mg/dL — ABNORMAL LOW (ref 70–99)
Glucose-Capillary: 70 mg/dL (ref 70–99)

## 2011-12-22 LAB — PROTIME-INR
INR: 1.47 (ref 0.00–1.49)
Prothrombin Time: 18.1 seconds — ABNORMAL HIGH (ref 11.6–15.2)

## 2011-12-22 MED ORDER — DEXTROSE 50 % IV SOLN: 25.0000 g | Freq: Once | INTRAVENOUS | Status: AC | PRN

## 2011-12-22 MED ORDER — FENTANYL CITRATE 0.05 MG/ML IJ SOLN
50.0000 ug | INTRAMUSCULAR | Status: DC | PRN
  Administered 2011-12-23 – 2011-12-28 (×32): 50 ug via INTRAVENOUS
  Filled 2011-12-22 (×30): qty 2

## 2011-12-22 MED ORDER — POTASSIUM CHLORIDE 10 MEQ/100ML IV SOLN: 10.0000 meq | INTRAVENOUS | Status: DC

## 2011-12-22 MED ORDER — FUROSEMIDE 10 MG/ML IJ SOLN
80.0000 mg | Freq: Four times a day (QID) | INTRAMUSCULAR | Status: DC
  Administered 2011-12-22 – 2011-12-25 (×12): 80 mg via INTRAVENOUS
  Filled 2011-12-22 (×18): qty 8

## 2011-12-22 MED ORDER — DEXTROSE 50 % IV SOLN
INTRAVENOUS | Status: AC
  Administered 2011-12-22: 50 mL
  Filled 2011-12-22: qty 50

## 2011-12-22 MED ORDER — CEFAZOLIN SODIUM 1-5 GM-% IV SOLN
1.0000 g | Freq: Once | INTRAVENOUS | Status: AC
  Administered 2011-12-23 (×2): 1 g via INTRAVENOUS
  Filled 2011-12-22: qty 50

## 2011-12-22 MED ORDER — POTASSIUM CHLORIDE 10 MEQ/50ML IV SOLN
INTRAVENOUS | Status: AC
  Administered 2011-12-22: 10 meq via INTRAVENOUS
  Filled 2011-12-22: qty 50

## 2011-12-22 MED ORDER — POTASSIUM CHLORIDE 10 MEQ/50ML IV SOLN
10.0000 meq | INTRAVENOUS | Status: AC
  Administered 2011-12-22 (×4): 10 meq via INTRAVENOUS
  Filled 2011-12-22: qty 150

## 2011-12-22 NOTE — Progress Notes (Signed)
Nutrition Follow-up  Patient remains intubated and sedated. Transferred to MICU (2100). Nepro formula infusing at 40 ml/hr via OGT providing 1728 kcals, 78 gm protein, 698 ml of free water. No indications for HD today per Renal service.  Diet Order:  NPO  Meds: Scheduled Meds:   . antiseptic oral rinse  15 mL Mouth Rinse QID  .  ceFAZolin (ANCEF) IV  1 g Intravenous Once  . chlorhexidine  15 mL Mouth Rinse BID  . dextrose      . furosemide  80 mg Intravenous Q6H  . insulin aspart  0-9 Units Subcutaneous Q4H  . levothyroxine  100 mcg Per Tube QAC breakfast  . pantoprazole (PROTONIX) IV  40 mg Intravenous Q1200  . potassium chloride  10 mEq Intravenous Q1 Hr x 4  . SAILS Study - rosuvastatin / placebo (PI-Wright)  20 mg Oral Daily  . DISCONTD: furosemide  80 mg Intravenous Q12H  . DISCONTD: potassium chloride  10 mEq Intravenous Q1 Hr x 4   Continuous Infusions:   . sodium chloride 20 mL/hr at 12/21/11 1920  . dextrose 25 mL/hr at 12/21/11 2101  . feeding supplement (NEPRO CARB STEADY) 1,000 mL (12/21/11 2101)  . fentaNYL infusion INTRAVENOUS Stopped (12/22/11 0820)  . midazolam (VERSED) infusion Stopped (12/22/11 0745)  . DISCONTD: dextrose 5 % and 0.9 % NaCl with KCl 20 mEq/L 50 mL/hr (12/21/11 0649)  . DISCONTD: feeding supplement (NEPRO CARB STEADY) 1,000 mL (12/21/11 1100)  . DISCONTD: furosemide (LASIX) infusion Stopped (12/20/11 1535)   PRN Meds:.acetaminophen (TYLENOL) oral liquid 160 mg/5 mL, fentaNYL, midazolam, ondansetron (ZOFRAN) IV, ondansetron, DISCONTD: furosemide  Labs:  CMP     Component Value Date/Time   NA 137 12/22/2011 0500   K 3.7 12/22/2011 0500   CL 107 12/22/2011 0500   CO2 21 12/22/2011 0500   GLUCOSE 237* 12/22/2011 0500   BUN 52* 12/22/2011 0500   CREATININE 3.33* 12/22/2011 0500   CALCIUM 7.2* 12/22/2011 0500   PROT 3.3* 12/20/2011 0231   ALBUMIN 1.8* 12/20/2011 0231   AST 423* 12/21/2011 0605   ALT 100* 12/21/2011 0605   ALKPHOS 36* 12/20/2011 0231   BILITOT 0.5 12/20/2011 0231   GFRNONAA 12* 12/22/2011 0500   GFRAA 14* 12/22/2011 0500     Intake/Output Summary (Last 24 hours) at 12/22/11 1109 Last data filed at 12/22/11 1000  Gross per 24 hour  Intake 2549.92 ml  Output   1735 ml  Net 814.92 ml    CBG (last 3)   Basename 12/22/11 1034 12/22/11 0731 12/22/11 0342  GLUCAP 70 87 113*    Weight Status:  93.4 kg (6/20) -- significantly up, likely due to significant pitting edema  Re-estimated needs:  1400-1500 kcals, 110-125 gm protein  Nutrition Dx:  Inadequate Oral Intake r/t inability to eat as evidenced by NPO status, ongoing  Goal:  EN to meet >90% of estimated nutrition needs, unmet Monitor: EN regimen, weight, labs, I/O's  Intervention:    As HD not indicated and electrolytes WNL at this time, recommend Pivot 1.5 (immune modulating formula) at goal rate of 35 ml/hr with Prostat 30 ml twice daily via tube to provide 1460 total kcals, 109 gm protein, 637 ml of free water  RD to follow for nutrition care plan  Alger Memos Pager #: [phone]

## 2011-12-22 NOTE — Progress Notes (Signed)
Inpatient Diabetes Program Recommendations  AACE/ADA: New Consensus Statement on Inpatient Glycemic Control (2009)  Target Ranges:  Prepandial:   less than 140 mg/dL      Peak postprandial:   less than 180 mg/dL (1-2 hours)      Critically ill patients:  140 - 180 mg/dL   Reason for Visit: CBGs continue to be less than 100 mg/dl  Inpatient Diabetes Program Recommendations Correction (SSI): .Discontinue Novolog correction scale.    Note:

## 2011-12-22 NOTE — Progress Notes (Signed)
CRITICAL VALUE ALERT  Critical value received:  CBG 64  Date of notification:  12/22/11  Time of notification:  1035  Critical value read back:yes  Nurse who received alert:  Little Ishikawa RN  MD notified (1st page):  Dr. Janee Morn  Time of first page:  1035  MD notified (2nd page):  Time of second page:  Responding MD:  Dr. Janee Morn  Time MD responded:  [phone]
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
## 2011-12-22 NOTE — Procedures (Deleted)
Extubation Procedure Note  Patient Details:   Name: KASSIA DEMARINIS DOB: [birth year] MRN: [id number]   Airway Documentation:  Pt extubated to 4L [place]. No stridor noted. Pt able to state name and cough.    Evaluation  O2 sats: stable throughout and currently acceptable Complications: No apparent complications Patient did tolerate procedure well. Bilateral Breath Sounds: Diminished Suctioning: Airway   Christie Beckers 12/22/2011, 11:08 AM
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
## 2011-12-22 NOTE — Progress Notes (Signed)
Chaplain Note:  Chaplain visited with pt and pt's family.  Pt was in bed, intubated.  Pt opened her eyes when spoken to but did not otherwise communicate during this visit. Pt's family was at bedside and expressed feelings of worry and sadness over pt's condition. Chaplain provided spiritual comfort, support, and prayer for pt and pt's family.  Chaplain will follow up as needed.  12/22/11 1300  Clinical Encounter Type  Visited With Patient and family together  Visit Type Spiritual support  Referral From Other (Comment) (Rubby Barbary-referral)  Spiritual Encounters  Spiritual Needs Emotional;Prayer  Stress Factors  Patient Stress Factors Major life changes;Health changes  Family Stress Factors Exhausted;Loss of control;Major life changes   Verdie Shire, chaplain resident [phone]

## 2011-12-23 ENCOUNTER — Encounter (HOSPITAL_COMMUNITY): Payer: Self-pay | Admitting: Anesthesiology

## 2011-12-23 ENCOUNTER — Inpatient Hospital Stay (HOSPITAL_COMMUNITY): Payer: Medicare Other

## 2011-12-23 ENCOUNTER — Inpatient Hospital Stay (HOSPITAL_COMMUNITY): Payer: Medicare Other | Admitting: Anesthesiology

## 2011-12-23 ENCOUNTER — Encounter (HOSPITAL_COMMUNITY): Payer: Self-pay | Admitting: *Deleted

## 2011-12-23 ENCOUNTER — Encounter (HOSPITAL_COMMUNITY): Admission: EM | Disposition: A | Discharge status: Skilled Nursing Facility | Payer: Self-pay | Source: Ambulatory Visit

## 2011-12-23 ENCOUNTER — Inpatient Hospital Stay: Admit: 2011-12-23 | Payer: Self-pay | Admitting: Orthopedic Surgery

## 2011-12-23 HISTORY — PX: PERCUTANEOUS PINNING: SHX2209

## 2011-12-23 HISTORY — PX: I&D EXTREMITY: SHX5045

## 2011-12-23 LAB — TYPE AND SCREEN
Antibody Screen: NEGATIVE
Unit division: 0
Unit division: 0
Unit division: 0
Unit division: 0
Unit division: 0
Unit division: 0

## 2011-12-23 LAB — CBC
HCT: 24.3 % — ABNORMAL LOW (ref 36.0–46.0)
MCHC: 34.6 g/dL (ref 30.0–36.0)
Platelets: 41 10*3/uL — ABNORMAL LOW (ref 150–400)
RDW: 16.9 % — ABNORMAL HIGH (ref 11.5–15.5)
WBC: 13.7 10*3/uL — ABNORMAL HIGH (ref 4.0–10.5)

## 2011-12-23 LAB — BLOOD GAS, ARTERIAL
Acid-base deficit: 2.1 mmol/L — ABNORMAL HIGH (ref 0.0–2.0)
Bicarbonate: 21.3 mEq/L (ref 20.0–24.0)
FIO2: 0.4 %
Patient temperature: 98.6
TCO2: 22.2 mmol/L (ref 0–100)
pH, Arterial: 7.455 — ABNORMAL HIGH (ref 7.350–7.400)

## 2011-12-23 LAB — GLUCOSE, CAPILLARY
Glucose-Capillary: 131 mg/dL — ABNORMAL HIGH (ref 70–99)
Glucose-Capillary: 162 mg/dL — ABNORMAL HIGH (ref 70–99)
Glucose-Capillary: 59 mg/dL — ABNORMAL LOW (ref 70–99)

## 2011-12-23 LAB — BASIC METABOLIC PANEL
BUN: 62 mg/dL — ABNORMAL HIGH (ref 6–23)
GFR calc Af Amer: 13 mL/min — ABNORMAL LOW (ref >90)
GFR calc non Af Amer: 11 mL/min — ABNORMAL LOW (ref >90)
Potassium: 4.4 mEq/L (ref 3.5–5.1)
Sodium: 139 mEq/L (ref 135–145)

## 2011-12-23 LAB — POCT I-STAT 3, ART BLOOD GAS (G3+)
Acid-base deficit: 2 mmol/L (ref 0.0–2.0)
Bicarbonate: 21.5 mEq/L (ref 20.0–24.0)
O2 Saturation: 98 %
pO2, Arterial: 107 mmHg — ABNORMAL HIGH (ref 80.0–100.0)

## 2011-12-23 LAB — ALT: ALT: 6 U/L (ref 0–35)

## 2011-12-23 LAB — AST: AST: 82 U/L — ABNORMAL HIGH (ref 0–37)

## 2011-12-23 LAB — CK: Total CK: 386 U/L — ABNORMAL HIGH (ref 7–177)

## 2011-12-23 SURGERY — IRRIGATION AND DEBRIDEMENT EXTREMITY
Anesthesia: General

## 2011-12-23 SURGERY — IRRIGATION AND DEBRIDEMENT EXTREMITY
Anesthesia: General | Site: Leg Lower | Laterality: Right | Wound class: Clean

## 2011-12-23 MED ORDER — EPHEDRINE SULFATE 50 MG/ML IJ SOLN
INTRAMUSCULAR | Status: DC | PRN
  Administered 2011-12-23: 20 mg via INTRAVENOUS

## 2011-12-23 MED ORDER — ROCURONIUM BROMIDE 100 MG/10ML IV SOLN
INTRAVENOUS | Status: DC | PRN
  Administered 2011-12-23: 50 mg via INTRAVENOUS

## 2011-12-23 MED ORDER — PHENYLEPHRINE HCL 10 MG/ML IJ SOLN
10.0000 mg | INTRAVENOUS | Status: DC | PRN
  Administered 2011-12-23: 50 ug/min via INTRAVENOUS

## 2011-12-23 MED ORDER — FENTANYL CITRATE 0.05 MG/ML IJ SOLN
INTRAMUSCULAR | Status: DC | PRN
  Administered 2011-12-23 (×2): 50 ug via INTRAVENOUS

## 2011-12-23 MED ORDER — SODIUM CHLORIDE 0.9 % IV SOLN
INTRAVENOUS | Status: DC | PRN
  Administered 2011-12-23: 09:00:00 via INTRAVENOUS

## 2011-12-23 MED ORDER — CEFAZOLIN SODIUM 1-5 GM-% IV SOLN
1.0000 g | Freq: Two times a day (BID) | INTRAVENOUS | Status: AC
  Administered 2011-12-23 – 2011-12-24 (×2): 1 g via INTRAVENOUS
  Filled 2011-12-23 (×2): qty 50

## 2011-12-23 MED ORDER — LACTATED RINGERS IV SOLN: INTRAVENOUS | Status: DC | PRN

## 2011-12-23 MED ORDER — PANTOPRAZOLE SODIUM 40 MG PO PACK
40.0000 mg | PACK | Freq: Every day | ORAL | Status: DC
  Administered 2011-12-24 – 2011-12-26 (×3): 40 mg
  Filled 2011-12-23 (×4): qty 20

## 2011-12-23 MED ORDER — DEXTROSE-NACL 5-0.45 % IV SOLN
INTRAVENOUS | Status: DC
  Administered 2011-12-23: 25 mL/h via INTRAVENOUS
  Administered 2011-12-24 – 2011-12-25 (×2): via INTRAVENOUS

## 2011-12-23 SURGICAL SUPPLY — 75 items
BALL CTTN LRG ABS STRL LF (GAUZE/BANDAGES/DRESSINGS)
BANDAGE ELASTIC 3 VELCRO ST LF (GAUZE/BANDAGES/DRESSINGS) ×9 IMPLANT
BANDAGE ELASTIC 4 VELCRO ST LF (GAUZE/BANDAGES/DRESSINGS) ×16 IMPLANT
BANDAGE ELASTIC 6 VELCRO ST LF (GAUZE/BANDAGES/DRESSINGS) ×6 IMPLANT
BANDAGE GAUZE ELAST BULKY 4 IN (GAUZE/BANDAGES/DRESSINGS) ×8 IMPLANT
BLADE DERMATOME SS (BLADE) ×1 IMPLANT
BLADE SURG 10 STRL SS (BLADE) ×2 IMPLANT
BLADE SURG ROTATE 9660 (MISCELLANEOUS) IMPLANT
BNDG COHESIVE 4X5 TAN STRL (GAUZE/BANDAGES/DRESSINGS) ×7 IMPLANT
BNDG GAUZE STRTCH 6 (GAUZE/BANDAGES/DRESSINGS) IMPLANT
BRUSH SCRUB DISP (MISCELLANEOUS) ×8 IMPLANT
CANISTER SUCTION 2500CC (MISCELLANEOUS) IMPLANT
CLOTH BEACON ORANGE TIMEOUT ST (SAFETY) ×4 IMPLANT
COTTONBALL LRG STERILE PKG (GAUZE/BANDAGES/DRESSINGS) ×4 IMPLANT
COVER SURGICAL LIGHT HANDLE (MISCELLANEOUS) ×8 IMPLANT
DEPRESSOR TONGUE BLADE STERILE (MISCELLANEOUS) IMPLANT
DERMACARRIERS GRAFT 1 TO 1.5 (DISPOSABLE)
DRAIN PENROSE 1/4X12 LTX STRL (WOUND CARE) ×4 IMPLANT
DRAPE C-ARMOR (DRAPES) ×2 IMPLANT
DRAPE EXTREMITY T 121X128X90 (DRAPE) IMPLANT
DRAPE INCISE IOBAN 66X45 STRL (DRAPES) ×2 IMPLANT
DRAPE PROXIMA HALF (DRAPES) ×8 IMPLANT
DRAPE U-SHAPE 47X51 STRL (DRAPES) ×4 IMPLANT
DRSG ADAPTIC 3X8 NADH LF (GAUZE/BANDAGES/DRESSINGS) ×6 IMPLANT
DRSG MEPITEL 4X7.2 (GAUZE/BANDAGES/DRESSINGS) ×20 IMPLANT
DRSG PAD ABDOMINAL 8X10 ST (GAUZE/BANDAGES/DRESSINGS) ×14 IMPLANT
DRSG VAC ATS LRG SENSATRAC (GAUZE/BANDAGES/DRESSINGS) ×3 IMPLANT
ELECT CAUTERY BLADE 6.4 (BLADE) ×2 IMPLANT
ELECT REM PT RETURN 9FT ADLT (ELECTROSURGICAL) ×4
ELECTRODE REM PT RTRN 9FT ADLT (ELECTROSURGICAL) ×1 IMPLANT
GAUZE SPONGE 4X4 16PLY XRAY LF (GAUZE/BANDAGES/DRESSINGS) ×1 IMPLANT
GAUZE XEROFORM 5X9 LF (GAUZE/BANDAGES/DRESSINGS) IMPLANT
GLOVE BIO SURGEON STRL SZ7.5 (GLOVE) ×4 IMPLANT
GLOVE BIO SURGEON STRL SZ8 (GLOVE) ×6 IMPLANT
GLOVE BIOGEL PI IND STRL 7.5 (GLOVE) ×4 IMPLANT
GLOVE BIOGEL PI IND STRL 8 (GLOVE) ×3 IMPLANT
GLOVE BIOGEL PI INDICATOR 7.5 (GLOVE) ×2
GLOVE BIOGEL PI INDICATOR 8 (GLOVE) ×1
GOWN PREVENTION PLUS XLARGE (GOWN DISPOSABLE) ×4 IMPLANT
GOWN PREVENTION PLUS XXLARGE (GOWN DISPOSABLE) IMPLANT
GOWN STRL NON-REIN LRG LVL3 (GOWN DISPOSABLE) ×8 IMPLANT
GRAFT DERMACARRIERS 1 TO 1.5 (DISPOSABLE) IMPLANT
HANDPIECE INTERPULSE COAX TIP (DISPOSABLE)
KIT BASIN OR (CUSTOM PROCEDURE TRAY) ×4 IMPLANT
KIT ROOM TURNOVER OR (KITS) ×4 IMPLANT
MANIFOLD NEPTUNE II (INSTRUMENTS) ×4 IMPLANT
NS IRRIG 1000ML POUR BTL (IV SOLUTION) ×4 IMPLANT
PACK GENERAL/GYN (CUSTOM PROCEDURE TRAY) ×4 IMPLANT
PACK ORTHO EXTREMITY (CUSTOM PROCEDURE TRAY) IMPLANT
PAD ARMBOARD 7.5X6 YLW CONV (MISCELLANEOUS) ×8 IMPLANT
PAD CAST 4YDX4 CTTN HI CHSV (CAST SUPPLIES) ×5 IMPLANT
PADDING CAST ABS 3INX4YD NS (CAST SUPPLIES) ×3
PADDING CAST ABS 4INX4YD NS (CAST SUPPLIES) ×3
PADDING CAST ABS COTTON 3X4 (CAST SUPPLIES) ×3 IMPLANT
PADDING CAST ABS COTTON 4X4 ST (CAST SUPPLIES) ×3 IMPLANT
PADDING CAST COTTON 4X4 STRL (CAST SUPPLIES) ×8
PADDING CAST COTTON 6X4 STRL (CAST SUPPLIES) ×2 IMPLANT
PENCIL BUTTON HOLSTER BLD 10FT (ELECTRODE) ×1 IMPLANT
SET HNDPC FAN SPRY TIP SCT (DISPOSABLE) IMPLANT
SPONGE GAUZE 4X4 12PLY (GAUZE/BANDAGES/DRESSINGS) ×16 IMPLANT
SPONGE LAP 18X18 X RAY DECT (DISPOSABLE) ×6 IMPLANT
SPONGE SCRUB IODOPHOR (GAUZE/BANDAGES/DRESSINGS) ×1 IMPLANT
STAPLER VISISTAT (STAPLE) IMPLANT
STAPLER VISISTAT 35W (STAPLE) ×2 IMPLANT
STOCKINETTE IMPERVIOUS 9X36 MD (GAUZE/BANDAGES/DRESSINGS) ×6 IMPLANT
STOCKINETTE IMPERVIOUS LG (DRAPES) IMPLANT
SUT ETHILON 3 0 FSL (SUTURE) ×8 IMPLANT
SUT PDS AB 2-0 CT1 27 (SUTURE) IMPLANT
TOWEL OR 17X24 6PK STRL BLUE (TOWEL DISPOSABLE) ×4 IMPLANT
TOWEL OR 17X26 10 PK STRL BLUE (TOWEL DISPOSABLE) ×10 IMPLANT
TUBE ANAEROBIC SPECIMEN COL (MISCELLANEOUS) IMPLANT
TUBE CONNECTING 12X1/4 (SUCTIONS) ×2 IMPLANT
UNDERPAD 30X30 INCONTINENT (UNDERPADS AND DIAPERS) ×6 IMPLANT
WATER STERILE IRR 1000ML POUR (IV SOLUTION) ×1 IMPLANT
YANKAUER SUCT BULB TIP NO VENT (SUCTIONS) ×1 IMPLANT

## 2011-12-23 NOTE — Anesthesia Preprocedure Evaluation (Signed)
Anesthesia Evaluation  Patient identified by MRN, date of birth, ID band Patient unresponsive    Reviewed: Allergy & Precautions, H&P , NPO status , Patient's Chart, lab work & pertinent test results, reviewed documented beta blocker date and time   Airway       Dental   Pulmonary          Cardiovascular hypertension, Pt. on home beta blockers +CHF     Neuro/Psych Anxiety TIA   GI/Hepatic hiatal hernia, GERD-  Medicated,  Endo/Other  Hypothyroidism   Renal/GU      Musculoskeletal  (+) Fibromyalgia -  Abdominal   Peds  Hematology   Anesthesia Other Findings   Reproductive/Obstetrics                           Anesthesia Physical Anesthesia Plan  ASA: IV  Anesthesia Plan: General   Post-op Pain Management:    Induction: Inhalational and Intravenous  Airway Management Planned: Oral ETT  Additional Equipment: Arterial line and CVP  Intra-op Plan:   Post-operative Plan: Post-operative intubation/ventilation  Informed Consent: I have reviewed the patients History and Physical, chart, labs and discussed the procedure including the risks, benefits and alternatives for the proposed anesthesia with the patient or authorized representative who has indicated his/her understanding and acceptance.     Plan Discussed with: CRNA, Anesthesiologist and Surgeon  Anesthesia Plan Comments:         Anesthesia Quick Evaluation

## 2011-12-23 NOTE — Transfer of Care (Signed)
Immediate Anesthesia Transfer of Care Note  Patient: [name]  Procedure(s) Performed: Procedure(s) (LRB): IRRIGATION AND DEBRIDEMENT EXTREMITY (Bilateral) SKIN GRAFT SPLIT THICKNESS (N/A)  Patient Location: ICU  Anesthesia Type: General  Level of Consciousness: sedated and Patient remains intubated per anesthesia plan  Airway & Oxygen Therapy: Patient Spontanous Breathing, Patient remains intubated per anesthesia plan and Patient placed on Ventilator (see vital sign flow sheet for setting)  Post-op Assessment: Report given to PACU RN  Post vital signs: Reviewed and stable  Complications: No apparent anesthesia complications

## 2011-12-23 NOTE — Op Note (Signed)
[name]               ACCOUNT NO.:  [id number]  MEDICAL RECORD NO.:  [id number]  LOCATION:  2101                         FACILITY:  [facility]  PHYSICIAN:  Doralee Albino. Carola Frost, M.D. DATE OF BIRTH:  1925-02-18  DATE OF PROCEDURE:  12/23/2011 DATE OF DISCHARGE:                              OPERATIVE REPORT   PREOPERATIVE DIAGNOSES: 1. Bilateral skin degloving injuries with the right extending into the     thigh from the calf. 2. Recurrent dislocation of fractured first metatarsal. 3. Left distal radius fracture and multiple left hand lacerations.  POSTOPERATIVE DIAGNOSES: 1. Bilateral skin degloving injuries with the right extending into the     thigh from the calf. 2. Recurrent dislocation of fractured first metatarsal. 3. Left distal radius fracture and multiple left hand lacerations.  PROCEDURES: 1. Irrigation and debridement of right leg degloving from the calf,     knee, and thigh. 2. I and D of left leg degloving with layered closure on the left, 8     cm. 3. Layered closure on the right, 30 cm. 4. Closed reduction and pinning of the tarsal metatarsal dislocation and right first metatarsal. 5. Hand dressing change under anesthesia. 6. New application of short-arm splint, left wrist and new application of     short-leg splint, right leg, for drainage and dressing changes.  SURGEON:  Doralee Albino. Carola Frost, MD  ASSISTANT:  Mearl Latin, PA-C  ANESTHESIA:  General.  COMPLICATIONS:  None.  IN/OUT:  One pack of platelets with additional 20 mL of crystalloid. Out urine of 475, blood loss of 50.  DISPOSITION:  To ICU.  CONDITION:  Stable.  DRAINS:  Two Penrose.  BRIEF SUMMARY OF INDICATION FOR PROCEDURE:  The patient is an 78 year old female multi-trauma patient who had bilateral leg wounds with continuous bleeding resulting in emergent treatment in the OR, during which, the patient coated.  She was resuscitated and has been stabilized in the ICU.  She now returns  for repeat look at her wounds, possible skin grafting versus VAC, trach placement.  The risks and benefits of the procedure include need for further surgery, wound breakdown, infection, and others.  BRIEF SUMMARY OF PROCEDURE:  The patient was taken to the operating room.  After administration of antibiotics, the left hand was addressed first with removal of her dressings, which were quite sticky and adherent.  The patient was under anesthesia for this dressing change. Then applied fresh Adaptic and Gauze  followed by a short-arm splint.  Attention was then turned to the left leg where the traumatic degloving wound was undressed and then, the margins were cleaned and prepped with Betadine with irrigation and then a layered closure over this 8 cm traumatic wound.  Drains were removed.  No purulence was encountered.  Attention was then turned to the right leg where similarly the wound VAC was removed including the sponges that extended over the medial and lateral aspects of the knee into the distal thigh.  After removal of the VAC, these areas were irrigated thoroughly using bulb syringe to avoid excessive trauma to the soft tissue flap.  The skin was then tacked down along the secured 30-cm wound edge  with far-near-near-far suture using nylon.  The fresh Mepitel was placed on all opened surfaces and two fourth-inch Penroses were extended up the medial and lateral gutters into the distal thigh.  Sterile gently compressive dressing was then applied.  Attention was turned distally at that point.  The patient had developed a dorsal blister from recurrent dislocation of her first metatarsal, which was fractured at the base.  This was ballotable and quite unstable.  It was reduced with traction and then two K-wires were placed bending them at the where they protruded and placing gauze to protect the soft tissues.  The adequacy of reduction was confirmed on three views of the foot; AP,  lateral, and oblique.  A short-leg splint was then placed to control the fibular fracture.  After wound change as the prior splint had been removed and this was placed as a bolster under the heel to avoid any potential for pressure there and the bolster was removed following hardening of the splint.  The patient was then transported to the ICU in stable condition.  [name], PA-C assisted me throughout the procedures and greatly expedited the patient's time in the OR.  PROGNOSIS:  [name] remains in critical condition without high risk of complications including infection, wound breakdown, and need for further surgery, which will likely include skin grafting subsequently. We are hopeful that the pins will last for 3 or 4 weeks, at which time, we should have sufficient stability to allow their removal.     Doralee Albino. Carola Frost, M.D.     [facility]/MEDQ  D:  12/23/2011  T:  12/23/2011  Job:  [id number]

## 2011-12-23 NOTE — Progress Notes (Signed)
Pharmacy: Ancef x 24hrs post-op  OBJECTIVE:  SCr 3.56, CrCl~13 ml/min.   ASSESSMENT:  78 y.o. F s/p multiple leg/arm/hand wounds s/p I&D on 6/18 and again today to receive post-op abx for 24 hours due to open wounds. Due to this patient's renal function -- she requires a renal adjustment of Ancef. The patient required her last dose of Ancef intra-op around 0800 on 6/21.   PLAN: Ancef 1g IV every 12 hours x 2 doses to complete 24 hours of post-op coverage.   Rolland Porter, Pharm.D., BCPS Clinical Pharmacist Pager: [phone] 12/23/2011 2:04 PM

## 2011-12-23 NOTE — Brief Op Note (Signed)
12/19/2011 - 12/23/2011  10:14 AM  PATIENT:  [name]  78 y.o. female  PRE-OPERATIVE DIAGNOSIS:  bilateral leg wounds, recurrent right first metarsal fracture dislocation, left distal radius fracture, left hand lacerations  POST-OPERATIVE DIAGNOSIS:  bilateral leg wounds, recurrent right first metarsal fracture dislocation, left distal radius fracture, left hand lacerations  PROCEDURE:  Procedure(s) (LRB): 1. IRRIGATION AND DEBRIDEMENT EXTREMITY (Bilateral) with layered closure 8 cm left, 30 cm right 2. Closed reduction and pinning right first metatarsal 3. Hand dressing change under anesthesia 4.  SURGEON:  Surgeon(s) and Role:    * Budd Palmer, MD - Primary  PHYSICIAN ASSISTANT: [name], [facility]  ANESTHESIA:   general  EBL:  Total I/O In: 408 [I.V.:20; Blood:388] Out: 525 [Urine:475; Blood:50]  BLOOD ADMINISTERED: plts  DRAINS: 2 pensrose   LOCAL MEDICATIONS USED:  NONE  SPECIMEN:  No Specimen  DISPOSITION OF SPECIMEN:  N/A  COUNTS:  YES  TOURNIQUET:  * Missing tourniquet times found for documented tourniquets in log:  [id number] *  DICTATION: .Other Dictation: Dictation Number [id number]  PLAN OF CARE: Admit to inpatient   PATIENT DISPOSITION:  PACU - hemodynamically stable.   Delay start of Pharmacological VTE agent (>24hrs) due to surgical blood loss or risk of bleeding: no

## 2011-12-23 NOTE — Clinical Social Work Note (Signed)
Clinical Social Worker continuing to follow patient and patient family for emotional support and discharge planning needs.  Patient is currently intubated and sedated, however per MD progressing well with injuries.  CSW attempted to reach patient daughter by phone due to no family at bedside.  Left message with patient daughter [name]) at 16:06 on 12/23/2011.  CSW will await return phone call to address SNF search process.  Clinical Social Worker will continue to follow for support and to facilitate patient discharge needs.  Pending patient progress, patient may be appropriate for inpatient rehab dependent upon family support at discharge.  [address] [place], [place], [place] [phone]

## 2011-12-23 NOTE — Anesthesia Postprocedure Evaluation (Signed)
  Anesthesia Post-op Note  Patient: [name]  Procedure(s) Performed: Procedure(s) (LRB): IRRIGATION AND DEBRIDEMENT EXTREMITY (Bilateral) PERCUTANEOUS PINNING EXTREMITY (Right)  Patient Location: SICU  Anesthesia Type: General  Level of Consciousness: sedated and Patient remains intubated per anesthesia plan  Airway and Oxygen Therapy: Patient remains intubated per anesthesia plan and Patient placed on Ventilator (see vital sign flow sheet for setting)  Post-op Pain: none  Post-op Assessment: Post-op Vital signs reviewed, Patient's Cardiovascular Status Stable, Respiratory Function Stable, Patent Airway, No signs of Nausea or vomiting and Pain level controlled  Post-op Vital Signs: stable  Complications: No apparent anesthesia complications

## 2011-12-23 NOTE — Anesthesia Preprocedure Evaluation (Deleted)
Anesthesia Evaluation  Patient identified by MRN, date of birth, ID band Patient awake    Reviewed: Allergy & Precautions, H&P , NPO status , Patient's Chart, lab work & pertinent test results  Airway Mallampati: I TM Distance: <3 FB Neck ROM: full    Dental   Pulmonary          Cardiovascular Rhythm:regular Rate:Normal     Neuro/Psych    GI/Hepatic   Endo/Other    Renal/GU      Musculoskeletal   Abdominal   Peds  Hematology   Anesthesia Other Findings   Reproductive/Obstetrics                           Anesthesia Physical Anesthesia Plan  ASA: I  Anesthesia Plan: General   Post-op Pain Management:    Induction: Intravenous  Airway Management Planned: Oral ETT and LMA  Additional Equipment:   Intra-op Plan:   Post-operative Plan: Extubation in OR  Informed Consent: I have reviewed the patients History and Physical, chart, labs and discussed the procedure including the risks, benefits and alternatives for the proposed anesthesia with the patient or authorized representative who has indicated his/her understanding and acceptance.     Plan Discussed with: CRNA, Anesthesiologist and Surgeon  Anesthesia Plan Comments:         Anesthesia Quick Evaluation  

## 2011-12-23 NOTE — Preoperative (Signed)
Beta Blockers   Reason not to administer Beta Blockers:Not Applicable 

## 2011-12-23 NOTE — Progress Notes (Signed)
Transferred to OR by OR team for I & D of  RLL.  VSS.  No distress noted.  Report given to OR RN.  Family notified.

## 2011-12-23 NOTE — Anesthesia Postprocedure Evaluation (Signed)
  Anesthesia Post-op Note  Patient: [name]  Procedure(s) Performed: Procedure(s) (LRB): IRRIGATION AND DEBRIDEMENT EXTREMITY (Bilateral) SKIN GRAFT SPLIT THICKNESS (N/A)  Patient Location: ICU  Anesthesia Type: General  Level of Consciousness: sedated and Patient remains intubated per anesthesia plan  Airway and Oxygen Therapy: Patient Spontanous Breathing, Patient remains intubated per anesthesia plan and Patient placed on Ventilator (see vital sign flow sheet for setting)  Post-op Pain: none  Post-op Assessment: Post-op Vital signs reviewed, Patient's Cardiovascular Status Stable, Respiratory Function Stable, Patent Airway, No signs of Nausea or vomiting and Pain level controlled  Post-op Vital Signs: Reviewed and stable  Complications: No apparent anesthesia complications

## 2011-12-23 NOTE — Progress Notes (Signed)
Follow up - Trauma and Critical Care  Patient Details:    [name] is an 78 y.o. female.  Lines/tubes : Airway 7.5 mm (Active)  Secured at (cm) 21 cm 12/23/2011 12:15 AM  Measured From Lips 12/23/2011 12:15 AM  Secured Location Right 12/23/2011 12:15 AM  Secured By [employer] 12/23/2011 12:15 AM  Tube Holder Repositioned Yes 12/23/2011 12:15 AM  Cuff Pressure (cm H2O) 24 cm H2O 12/22/2011  4:29 PM  Site Condition Dry 12/23/2011 12:15 AM     CVC Triple Lumen 12/19/11 Left Internal jugular (Active)  Site Assessment Clean;Dry;Intact 12/22/2011  8:00 PM  Proximal Lumen Status Capped (Central line) 12/22/2011  8:00 PM  Medial Infusing 12/22/2011  8:00 PM  Distal Lumen Status Infusing 12/22/2011  8:00 PM  Dressing Type Transparent 12/22/2011  8:00 PM  Dressing Status Clean;Dry;Intact;Antimicrobial disc in place 12/22/2011  8:00 PM  Line Care Cap(s) changed;Connections checked and tightened;Zeroed and calibrated 12/22/2011  8:00 PM  Dressing Intervention Antimicrobial disc changed;New dressing 12/19/2011  8:00 PM  Dressing Change Due 12/28/11 12/22/2011  8:00 AM  Indication for Insertion or Continuance of Line Vasoactive infusions;Poor Vasculature-patient has had multiple peripheral attempts or PIVs lasting less than 24 hours 12/22/2011  8:00 PM     Chest Tube 1 Right 28 Fr. (Active)  Suction To water seal 12/22/2011  8:00 PM  Chest Tube Air Leak None 12/22/2011  8:00 PM  Patency Intervention Tip/tilt 12/22/2011  8:00 PM  Drainage Description Serosanguineous 12/22/2011  8:00 PM  Dressing Status Clean;Dry;Intact 12/22/2011  8:00 PM  Dressing Intervention Dressing changed 12/20/2011  8:00 PM  Site Assessment Clean;Dry;Intact 12/22/2011  8:00 PM  Surrounding Skin Unable to view 12/22/2011  8:00 PM  Output (mL) 100 mL 12/23/2011  6:00 AM     Chest Tube 2 Left 28 Fr. (Active)  Suction To water seal 12/22/2011  8:00 PM  Chest Tube Air Leak None 12/22/2011  8:00 PM  Patency Intervention Tip/tilt  12/22/2011  8:00 PM  Drainage Description Serosanguineous 12/22/2011  8:00 PM  Dressing Status Clean;Dry;Intact 12/22/2011  8:00 PM  Dressing Intervention Dressing changed 12/20/2011  8:00 PM  Site Assessment Clean;Dry;Intact 12/22/2011  8:00 PM  Surrounding Skin Unable to view 12/22/2011  8:00 PM  Output (mL) 55 mL 12/23/2011  6:00 AM     Negative Pressure Wound Therapy Leg Lower;Right (Active)  Site / Wound Assessment Clean;Dry 12/22/2011  8:00 PM  Cycle Continuous 12/22/2011  8:00 PM  Target Pressure (mmHg) 125 12/22/2011  8:00 PM  Canister Changed No 12/22/2011  8:00 PM  Dressing Status Intact 12/22/2011  8:00 PM  Drainage Amount Moderate 12/22/2011  8:00 PM  Drainage Description Serosanguineous 12/22/2011  8:00 PM  Output (mL) 50 mL 12/23/2011  6:00 AM     NG/OG Tube Orogastric Center mouth (Active)  Placement Verification Auscultation 12/22/2011  8:00 PM  Site Assessment Intact;Dry;Clean 12/22/2011  8:00 PM  Status Irrigated;Infusing tube feed 12/22/2011  8:00 PM  Drainage Appearance Brown 12/20/2011  8:00 AM  Gastric Residual 10 mL 12/22/2011  8:00 PM  Intake (mL) 0 mL 12/23/2011 12:00 AM  Output (mL) 0 mL 12/21/2011  4:00 PM     Urethral Catheter Latex 14 Fr. (Active)  Site Assessment Clean;Intact 12/22/2011  8:00 PM  Collection Container Standard drainage bag 12/22/2011  8:00 PM  Securement Method Leg strap 12/22/2011  8:00 PM  Urinary Catheter Interventions Unclamped 12/22/2011  8:00 PM  Indication for Insertion or Continuance of Catheter Physician order;Perioperative use;Prolonged immobilization;Urinary  output monitoring 12/22/2011  8:00 PM  Output (mL) 100 mL 12/23/2011  6:00 AM    Microbiology/Sepsis markers: Results for orders placed during the hospital encounter of 12/19/11  MRSA PCR SCREENING     Status: Normal   Collection Time   12/19/11  6:54 PM      Component Value Range Status Comment   MRSA by PCR NEGATIVE  NEGATIVE Final     Anti-infectives:  Anti-infectives     Start      Dose/Rate Route Frequency Ordered Stop   12/23/11 0700   ceFAZolin (ANCEF) IVPB 1 g/50 mL premix        1 g 100 mL/hr over 30 Minutes Intravenous  Once 12/22/11 0831 12/23/11 0630   12/20/11 1500   ceFAZolin (ANCEF) IVPB 1 g/50 mL premix        1 g 100 mL/hr over 30 Minutes Intravenous Every 12 hours 12/20/11 0952 12/21/11 0312   12/19/11 2300   ceFAZolin (ANCEF) IVPB 2 g/50 mL premix        2 g 100 mL/hr over 30 Minutes Intravenous  Once 12/19/11 1853 12/20/11 0328          Best Practice/Protocols:  VTE Prophylaxis: Mechanical GI Prophylaxis: Proton Pump Inhibitor Intermittent Sedation  Consults: Treatment Team:  Budd Palmer, MD Cecille Aver, MD    Events:  Subjective:    Overnight Issues: BUN and creatinine continue to increase.  Started following commands overnight.  Objective:  Vital signs for last 24 hours: Temp:  [99 F (37.2 C)-101.8 F (38.8 C)] 100 F (37.8 C) (06/21 0700) Pulse Rate:  [87-106] 93  (06/21 0700) Resp:  [0-35] 11  (06/21 0700) BP: (118-176)/(46-70) 164/65 mmHg (06/21 0700) SpO2:  [96 %-100 %] 100 % (06/21 0700) FiO2 (%):  [39.8 %-40.4 %] 40 % (06/21 0700) Weight:  [92.1 kg (203 lb 0.7 oz)] 92.1 kg (203 lb 0.7 oz) (06/21 0000)  Hemodynamic parameters for last 24 hours: CVP:  [7 mmHg-24 mmHg] 8 mmHg  Intake/Output from previous day: 06/20 0701 - 06/21 0700 In: 2023.9 [I.V.:981.9; NG/GT:760; IV Piggyback:282] Out: 2910 [Urine:1905; Drains:850; Chest Tube:155]  Intake/Output this shift:    Vent settings for last 24 hours: Vent Mode:  [-] PRVC FiO2 (%):  [39.8 %-40.4 %] 40 % Set Rate:  [20 bmp] 20 bmp Vt Set:  [480 mL] 480 mL PEEP:  [5 cmH20] 5 cmH20 Pressure Support:  [10 cmH20] 10 cmH20 Plateau Pressure:  [9 cmH20-18 cmH20] 18 cmH20  Physical Exam:  General: no respiratory distress and starting to follow commands, still very sleepy Neuro: oriented, nonfocal exam, RASS -2 and still sleepy but responds  appropriately Resp: clear to auscultation bilaterally and CXr shows not new infiltrates GI: soft, nontender, BS WNL, no r/g and Tolerating tube feedings, but they have been off for surgery Skin: no rash and pressure ulcer stage No actual pressure sore, but skin tears. Extremities: edema 3+ and Still requires surgery and VAC change  Results for orders placed during the hospital encounter of 12/19/11 (from the past 24 hour(s))  PROTIME-INR     Status: Abnormal   Collection Time   12/22/11  8:30 AM      Component Value Range   Prothrombin Time 18.1 (*) 11.6 - 15.2 seconds   INR 1.47  0.00 - 1.49  GLUCOSE, CAPILLARY     Status: Normal   Collection Time   12/22/11 10:34 AM      Component Value Range   Glucose-Capillary 70  70 - 99 mg/dL  GLUCOSE, CAPILLARY     Status: Abnormal   Collection Time   12/22/11 11:26 AM      Component Value Range   Glucose-Capillary 64 (*) 70 - 99 mg/dL  GLUCOSE, CAPILLARY     Status: Abnormal   Collection Time   12/22/11  1:47 PM      Component Value Range   Glucose-Capillary 114 (*) 70 - 99 mg/dL  GLUCOSE, CAPILLARY     Status: Abnormal   Collection Time   12/22/11  3:38 PM      Component Value Range   Glucose-Capillary 112 (*) 70 - 99 mg/dL  CBC     Status: Abnormal   Collection Time   12/22/11  5:00 PM      Component Value Range   WBC 12.1 (*) 4.0 - 10.5 K/uL   RBC 2.74 (*) 3.87 - 5.11 MIL/uL   Hemoglobin 8.4 (*) 12.0 - 15.0 g/dL   HCT 21.3 (*) 08.6 - 57.8 %   MCV 87.6  78.0 - 100.0 fL   MCH 30.7  26.0 - 34.0 pg   MCHC 35.0  30.0 - 36.0 g/dL   RDW 46.9 (*) 62.9 - 52.8 %   Platelets 35 (*) 150 - 400 K/uL  GLUCOSE, CAPILLARY     Status: Abnormal   Collection Time   12/22/11  8:13 PM      Component Value Range   Glucose-Capillary 69 (*) 70 - 99 mg/dL  GLUCOSE, CAPILLARY     Status: Abnormal   Collection Time   12/22/11  8:32 PM      Component Value Range   Glucose-Capillary 164 (*) 70 - 99 mg/dL  GLUCOSE, CAPILLARY     Status: Abnormal    Collection Time   12/22/11 11:44 PM      Component Value Range   Glucose-Capillary 162 (*) 70 - 99 mg/dL   Comment 1 Documented in Chart     Comment 2 Notify RN    GLUCOSE, CAPILLARY     Status: Normal   Collection Time   12/23/11  3:25 AM      Component Value Range   Glucose-Capillary 95  70 - 99 mg/dL  ALT     Status: Normal   Collection Time   12/23/11  4:00 AM      Component Value Range   ALT 6  0 - 35 U/L  AST     Status: Abnormal   Collection Time   12/23/11  4:00 AM      Component Value Range   AST 82 (*) 0 - 37 U/L  CK     Status: Abnormal   Collection Time   12/23/11  4:00 AM      Component Value Range   Total CK 386 (*) 7 - 177 U/L  CBC     Status: Abnormal   Collection Time   12/23/11  4:00 AM      Component Value Range   WBC 13.7 (*) 4.0 - 10.5 K/uL   RBC 2.72 (*) 3.87 - 5.11 MIL/uL   Hemoglobin 8.4 (*) 12.0 - 15.0 g/dL   HCT 41.3 (*) 24.4 - 01.0 %   MCV 89.3  78.0 - 100.0 fL   MCH 30.9  26.0 - 34.0 pg   MCHC 34.6  30.0 - 36.0 g/dL   RDW 27.2 (*) 53.6 - 64.4 %   Platelets 41 (*) 150 - 400 K/uL  BASIC METABOLIC PANEL  Status: Abnormal   Collection Time   12/23/11  4:00 AM      Component Value Range   Sodium 139  135 - 145 mEq/L   Potassium 4.4  3.5 - 5.1 mEq/L   Chloride 106  96 - 112 mEq/L   CO2 22  19 - 32 mEq/L   Glucose, Bld 176 (*) 70 - 99 mg/dL   BUN 62 (*) 6 - 23 mg/dL   Creatinine, Ser 6.29 (*) 0.50 - 1.10 mg/dL   Calcium 8.0 (*) 8.4 - 10.5 mg/dL   GFR calc non Af Amer 11 (*) >90 mL/min   GFR calc Af Amer 13 (*) >90 mL/min  BLOOD GAS, ARTERIAL     Status: Abnormal   Collection Time   12/23/11  5:00 AM      Component Value Range   FIO2 0.40     Delivery systems VENTILATOR     Mode PRESSURE REGULATED VOLUME CONTROL     VT 480     Rate 20     Peep/cpap 5.0     pH, Arterial 7.455 (*) 7.350 - 7.400   pCO2 arterial 30.7 (*) 35.0 - 45.0 mmHg   pO2, Arterial 121.0 (*) 80.0 - 100.0 mmHg   Bicarbonate 21.3  20.0 - 24.0 mEq/L   TCO2 22.2  0 - 100  mmol/L   Acid-base deficit 2.1 (*) 0.0 - 2.0 mmol/L   O2 Saturation 99.5     Patient temperature 98.6     Collection site RIGHT RADIAL     Drawn by [id number]     Sample type ARTERIAL     Allens test (pass/fail) PASS  PASS     Assessment/Plan:   NEURO  Altered Mental Status:  sedation   Plan: Sedation has been withheld, but gets intermittent fentanyl  PULM  Respiratory Alkalosis (compensatory) Lung Trauma (with contusion of lung) and Pneumothorax (traumatic)   Plan: both chaets tubes are on waterseal, probably can start to remove them starting tomorrow.  CARDIO  Sinus Tachycardia and occasional sinus tachycardia   Plan: No changes  RENAL  Oliguria (suspect ATN and urine output bolstered by scheduled IV Lasix)   Plan: Nephrology services involved. Will continue tomonitor fluids closely  GI  No abnormality   Plan: C{M. Restart TF after surgery  ID  No active infections known.   Plan: CPM  HEME  Anemia acute blood loss anemia and anemia of critical illness)   Plan: No blood for now but may need some after surgery  ENDO No specific abnormality.  Patient had been having problems with hypoglycemia, but found to be inaccurate from finger sticks.  Real glucose is higher and pateint likely does not need to be on D10W   Plan: Stop D10W, and change IVFs to D5W  Global Issues  The patient is doing well enough to se weaned formt he ventilator, but will ge going back to OR later today,  Will be more aggressive about weaning her tomorrow.  Will likely need to get CCM involved.    LOS: 4 days   Additional comments:I reviewed the patient's new clinical lab test results. cbc/abg/bmet and I reviewed the patients new imaging test results. CXR  Critical Care Total Time*: 30 Minutes  Keyaria Lawson O 12/23/2011  *Care during the described time interval was provided by me and/or other providers on the critical care team.  I have reviewed this patient's available data, including medical history,  events of note, physical examination and test results as  part of my evaluation.
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
## 2011-12-24 LAB — DIFFERENTIAL
Basophils Absolute: 0.1 10*3/uL (ref 0.0–0.1)
Eosinophils Absolute: 0.6 10*3/uL (ref 0.0–0.7)
Lymphs Abs: 2 10*3/uL (ref 0.7–4.0)
Neutro Abs: 10 10*3/uL — ABNORMAL HIGH (ref 1.7–7.7)

## 2011-12-24 LAB — BLOOD GAS, ARTERIAL
Acid-base deficit: 1.6 mmol/L (ref 0.0–2.0)
Drawn by: 35849
MECHVT: 480 mL
RATE: 16 resp/min
pCO2 arterial: 31.8 mmHg — ABNORMAL LOW (ref 35.0–45.0)
pH, Arterial: 7.452 — ABNORMAL HIGH (ref 7.350–7.400)

## 2011-12-24 LAB — BASIC METABOLIC PANEL
BUN: 73 mg/dL — ABNORMAL HIGH (ref 6–23)
Calcium: 8.4 mg/dL (ref 8.4–10.5)
Creatinine, Ser: 3.46 mg/dL — ABNORMAL HIGH (ref 0.50–1.10)
GFR calc non Af Amer: 11 mL/min — ABNORMAL LOW (ref >90)
Glucose, Bld: 169 mg/dL — ABNORMAL HIGH (ref 70–99)

## 2011-12-24 LAB — CBC
HCT: 21.5 % — ABNORMAL LOW (ref 36.0–46.0)
Hemoglobin: 7.5 g/dL — ABNORMAL LOW (ref 12.0–15.0)
MCH: 32.2 pg (ref 26.0–34.0)
MCHC: 34.9 g/dL (ref 30.0–36.0)

## 2011-12-24 LAB — PREPARE PLATELET PHERESIS

## 2011-12-24 LAB — GLUCOSE, CAPILLARY
Glucose-Capillary: 154 mg/dL — ABNORMAL HIGH (ref 70–99)
Glucose-Capillary: 140 mg/dL — ABNORMAL HIGH (ref 70–99)
Glucose-Capillary: 149 mg/dL — ABNORMAL HIGH (ref 70–99)
Glucose-Capillary: 158 mg/dL — ABNORMAL HIGH (ref 70–99)

## 2011-12-24 MED ORDER — STUDY - INVESTIGATIONAL DRUG SIMPLE RECORD
10.0000 mg | Freq: Every day | Status: DC
  Administered 2011-12-25 – 2011-12-26 (×2): 10 mg via ORAL
  Filled 2011-12-24 (×2): qty 10

## 2011-12-24 MED ORDER — STUDY - INVESTIGATIONAL DRUG SIMPLE RECORD
40.0000 mg | Freq: Once | Status: AC
  Administered 2011-12-24: 40 mg via ORAL
  Filled 2011-12-24: qty 40
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
## 2011-12-24 NOTE — Progress Notes (Signed)
Discussed DNR with Daughter, Jasmine December.   Do not plan to make her no escalation of care or withdrawal of care.  Will initiate DNR so no resuscitation if cardiopulmonary arrest.

## 2011-12-24 NOTE — Progress Notes (Signed)
Placed pt back in rest mode due to increased RR and agitation

## 2011-12-25 LAB — GLUCOSE, CAPILLARY
Glucose-Capillary: 134 mg/dL — ABNORMAL HIGH (ref 70–99)
Glucose-Capillary: 139 mg/dL — ABNORMAL HIGH (ref 70–99)
Glucose-Capillary: 132 mg/dL — ABNORMAL HIGH (ref 70–99)
Glucose-Capillary: 102 mg/dL — ABNORMAL HIGH (ref 70–99)

## 2011-12-25 LAB — CBC
Hemoglobin: 7.7 g/dL — ABNORMAL LOW (ref 12.0–15.0)
MCH: 32.1 pg (ref 26.0–34.0)
RBC: 2.4 MIL/uL — ABNORMAL LOW (ref 3.87–5.11)

## 2011-12-25 LAB — DIFFERENTIAL
Basophils Relative: 2 % — ABNORMAL HIGH (ref 0–1)
Eosinophils Relative: 1 % (ref 0–5)
Lymphocytes Relative: 10 % — ABNORMAL LOW (ref 12–46)
Neutrophils Relative %: 75 % (ref 43–77)

## 2011-12-25 LAB — BASIC METABOLIC PANEL
BUN: 87 mg/dL — ABNORMAL HIGH (ref 6–23)
Creatinine, Ser: 3.51 mg/dL — ABNORMAL HIGH (ref 0.50–1.10)
GFR calc non Af Amer: 11 mL/min — ABNORMAL LOW (ref >90)
Glucose, Bld: 139 mg/dL — ABNORMAL HIGH (ref 70–99)
Potassium: 3.8 mEq/L (ref 3.5–5.1)

## 2011-12-25 LAB — MAGNESIUM: Magnesium: 2.3 mg/dL (ref 1.5–2.5)
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
## 2011-12-25 NOTE — Progress Notes (Signed)
Patient ID: [name], female   DOB: June 22, 1925, 78 y.o.   MRN: [id number] Follow up - Trauma and Critical Care  Patient Details:    [name] is an 78 y.o. female.  Lines/tubes : Airway 7.5 mm (Active)  Secured at (cm) 21 cm 12/23/2011 12:15 AM  Measured From Lips 12/23/2011 12:15 AM  Secured Location Right 12/23/2011 12:15 AM  Secured By [employer] 12/23/2011 12:15 AM  Tube Holder Repositioned Yes 12/23/2011 12:15 AM  Cuff Pressure (cm H2O) 24 cm H2O 12/22/2011  4:29 PM  Site Condition Dry 12/23/2011 12:15 AM     CVC Triple Lumen 12/19/11 Left Internal jugular (Active)  Site Assessment Clean;Dry;Intact 12/22/2011  8:00 PM  Proximal Lumen Status Capped (Central line) 12/22/2011  8:00 PM  Medial Infusing 12/22/2011  8:00 PM  Distal Lumen Status Infusing 12/22/2011  8:00 PM  Dressing Type Transparent 12/22/2011  8:00 PM  Dressing Status Clean;Dry;Intact;Antimicrobial disc in place 12/22/2011  8:00 PM  Line Care Cap(s) changed;Connections checked and tightened;Zeroed and calibrated 12/22/2011  8:00 PM  Dressing Intervention Antimicrobial disc changed;New dressing 12/19/2011  8:00 PM  Dressing Change Due 12/28/11 12/22/2011  8:00 AM  Indication for Insertion or Continuance of Line Vasoactive infusions;Poor Vasculature-patient has had multiple peripheral attempts or PIVs lasting less than 24 hours 12/22/2011  8:00 PM     Chest Tube 1 Right 28 Fr. (Active)  Suction To water seal 12/22/2011  8:00 PM  Chest Tube Air Leak None 12/22/2011  8:00 PM  Patency Intervention Tip/tilt 12/22/2011  8:00 PM  Drainage Description Serosanguineous 12/22/2011  8:00 PM  Dressing Status Clean;Dry;Intact 12/22/2011  8:00 PM  Dressing Intervention Dressing changed 12/20/2011  8:00 PM  Site Assessment Clean;Dry;Intact 12/22/2011  8:00 PM  Surrounding Skin Unable to view 12/22/2011  8:00 PM  Output (mL) 100 mL 12/23/2011  6:00 AM     Chest Tube 2 Left 28 Fr. (Active)  Suction To water seal 12/22/2011  8:00  PM  Chest Tube Air Leak None 12/22/2011  8:00 PM  Patency Intervention Tip/tilt 12/22/2011  8:00 PM  Drainage Description Serosanguineous 12/22/2011  8:00 PM  Dressing Status Clean;Dry;Intact 12/22/2011  8:00 PM  Dressing Intervention Dressing changed 12/20/2011  8:00 PM  Site Assessment Clean;Dry;Intact 12/22/2011  8:00 PM  Surrounding Skin Unable to view 12/22/2011  8:00 PM  Output (mL) 55 mL 12/23/2011  6:00 AM     Negative Pressure Wound Therapy Leg Lower;Right (Active)  Site / Wound Assessment Clean;Dry 12/22/2011  8:00 PM  Cycle Continuous 12/22/2011  8:00 PM  Target Pressure (mmHg) 125 12/22/2011  8:00 PM  Canister Changed No 12/22/2011  8:00 PM  Dressing Status Intact 12/22/2011  8:00 PM  Drainage Amount Moderate 12/22/2011  8:00 PM  Drainage Description Serosanguineous 12/22/2011  8:00 PM  Output (mL) 50 mL 12/23/2011  6:00 AM     NG/OG Tube Orogastric Center mouth (Active)  Placement Verification Auscultation 12/22/2011  8:00 PM  Site Assessment Intact;Dry;Clean 12/22/2011  8:00 PM  Status Irrigated;Infusing tube feed 12/22/2011  8:00 PM  Drainage Appearance Brown 12/20/2011  8:00 AM  Gastric Residual 10 mL 12/22/2011  8:00 PM  Intake (mL) 0 mL 12/23/2011 12:00 AM  Output (mL) 0 mL 12/21/2011  4:00 PM     Urethral Catheter Latex 14 Fr. (Active)  Site Assessment Clean;Intact 12/22/2011  8:00 PM  Collection Container Standard drainage bag 12/22/2011  8:00 PM  Securement Method Leg strap 12/22/2011  8:00 PM  Urinary Catheter Interventions Unclamped  12/22/2011  8:00 PM  Indication for Insertion or Continuance of Catheter Physician order;Perioperative use;Prolonged immobilization;Urinary output monitoring 12/22/2011  8:00 PM  Output (mL) 100 mL 12/23/2011  6:00 AM    Microbiology/Sepsis markers: Results for orders placed during the hospital encounter of 12/19/11  MRSA PCR SCREENING     Status: Normal   Collection Time   12/19/11  6:54 PM      Component Value Range Status Comment   MRSA by PCR  NEGATIVE  NEGATIVE Final     Anti-infectives:  Anti-infectives     Start     Dose/Rate Route Frequency Ordered Stop   12/23/11 2000   ceFAZolin (ANCEF) IVPB 1 g/50 mL premix        1 g 100 mL/hr over 30 Minutes Intravenous Every 12 hours 12/23/11 1406 12/24/11 0858   12/23/11 0700   ceFAZolin (ANCEF) IVPB 1 g/50 mL premix        1 g 100 mL/hr over 30 Minutes Intravenous  Once 12/22/11 0831 12/23/11 0843   12/20/11 1500   ceFAZolin (ANCEF) IVPB 1 g/50 mL premix        1 g 100 mL/hr over 30 Minutes Intravenous Every 12 hours 12/20/11 0952 12/21/11 0312   12/19/11 2300   ceFAZolin (ANCEF) IVPB 2 g/50 mL premix        2 g 100 mL/hr over 30 Minutes Intravenous  Once 12/19/11 1853 12/20/11 0328          Best Practice/Protocols:  VTE Prophylaxis: Mechanical GI Prophylaxis: Proton Pump Inhibitor Intermittent Sedation  Consults: Treatment Team:  [name] Palmer, MD [name] Aver, MD    Events:  Subjective:    Overnight Issues: Tolerated PS yesterday until late pm then tired out & switched back to [facility] for night. On PS now. Made DNR yesterday. CT still on water seal.   Objective:  Vital signs for last 24 hours: Temp:  [99.2 F (37.3 C)-101.3 F (38.5 C)] 100.9 F (38.3 C) (06/23 0900) Pulse Rate:  [99-110] 109  (06/23 0900) Resp:  [8-33] 29  (06/23 0900) BP: (100-143)/(43-77) 130/61 mmHg (06/23 0900) SpO2:  [96 %-100 %] 98 % (06/23 0900) FiO2 (%):  [29.8 %-40 %] 30 % (06/23 0900) Weight:  [202 lb 13.2 oz (92 kg)] 202 lb 13.2 oz (92 kg) (06/23 0300)  Hemodynamic parameters for last 24 hours: CVP:  [3 mmHg-16 mmHg] 11 mmHg  Intake/Output from previous day: 06/22 0701 - 06/23 0700 In: 1593 [I.V.:575; NG/GT:960; IV Piggyback:58] Out: 2040 [Urine:1850; Chest Tube:190]  Intake/Output this shift: Total I/O In: 190 [I.V.:50; NG/GT:140] Out: 325 [Urine:325]  Vent settings for last 24 hours: Vent Mode:  [-] CPAP;PSV FiO2 (%):  [29.8 %-40 %] 30 % Set Rate:   [16 bmp] 16 bmp Vt Set:  [480 mL] 480 mL PEEP:  [4.9 cmH20-5 cmH20] 5 cmH20 Pressure Support:  [10 cmH20] 10 cmH20 Plateau Pressure:  [15 cmH20-20 cmH20] 15 cmH20  Physical Exam:  General: no respiratory distress, follows commands x4, alert; MAE Neuro: interacts with yes/no questions; MAE Resp: clear to auscultation bilaterally  GI: soft, nontender, BS WNL, no r/g, distended.   Skin: Dressings in place on upper and lower extremities.   Extremities: edema RUE 3+, splints in place BLE, +motor/sensory B 1st web space. Good cap refill   Results for orders placed during the hospital encounter of 12/19/11 (from the past 24 hour(s))  GLUCOSE, CAPILLARY     Status: Abnormal   Collection Time   12/24/11 12:05  PM      Component Value Range   Glucose-Capillary 158 (*) 70 - 99 mg/dL  GLUCOSE, CAPILLARY     Status: Abnormal   Collection Time   12/24/11  3:54 PM      Component Value Range   Glucose-Capillary 153 (*) 70 - 99 mg/dL  GLUCOSE, CAPILLARY     Status: Abnormal   Collection Time   12/24/11  7:14 PM      Component Value Range   Glucose-Capillary 168 (*) 70 - 99 mg/dL   Comment 1 Notify RN    GLUCOSE, CAPILLARY     Status: Abnormal   Collection Time   12/24/11 11:26 PM      Component Value Range   Glucose-Capillary 134 (*) 70 - 99 mg/dL  GLUCOSE, CAPILLARY     Status: Abnormal   Collection Time   12/25/11  3:32 AM      Component Value Range   Glucose-Capillary 139 (*) 70 - 99 mg/dL  BASIC METABOLIC PANEL     Status: Abnormal   Collection Time   12/25/11  4:44 AM      Component Value Range   Sodium 139  135 - 145 mEq/L   Potassium 3.8  3.5 - 5.1 mEq/L   Chloride 104  96 - 112 mEq/L   CO2 22  19 - 32 mEq/L   Glucose, Bld 139 (*) 70 - 99 mg/dL   BUN 87 (*) 6 - 23 mg/dL   Creatinine, Ser 9.56 (*) 0.50 - 1.10 mg/dL   Calcium 8.7  8.4 - 21.3 mg/dL   GFR calc non Af Amer 11 (*) >90 mL/min   GFR calc Af Amer 13 (*) >90 mL/min  GLUCOSE, CAPILLARY     Status: Abnormal   Collection  Time   12/25/11  7:35 AM      Component Value Range   Glucose-Capillary 174 (*) 70 - 99 mg/dL     Assessment/Plan:   NEURO  Altered Mental Status:  sedation   Plan: PRN fentanyl, alert.    PULM  Respiratory Alkalosis (compensatory) Lung Trauma (with contusion of lung) and Pneumothorax (traumatic)   Plan: both chest tubes are on waterseal, cont vent weaning today.  Will check CXR Monday am; PS during day; rest on vent as needed  CARDIO  Sinus Tachycardia and occasional sinus tachycardia; cvp 3   Plan: stable.    RENAL  Non-oliguric ARI;     Plan: continue minimizing fluids.    GI  No abnormality   Plan: Tube feeds at goal.  Protonix for prophylaxis.    ID  No active infections known.   Plan: CPM; ancef per ortho  HEME  Anemia acute blood loss anemia and anemia of critical illness)   Plan: appears to be perfusing. Check cbc this am; may transfuse   ENDO Hyperglycemia   Plan: sliding scale insulin.    Global Issues  Aggressive weaning today.  ? Extubation soon.      LOS: 6 days   Additional comments:I reviewed the patient's labs results, consultant's notes  Critical Care Total Time*: 31 Minutes  Mary Sella. Andrey Campanile, MD, FACS General, Bariatric, & Minimally Invasive Surgery [facility], [place]  Gaynelle Adu M 12/25/2011  *Care during the described time interval was provided by me and/or other providers on the critical care team.  I have reviewed this patient's available data, including medical history, events of note, physical examination and test results as part of my evaluation.

## 2011-12-26 ENCOUNTER — Inpatient Hospital Stay (HOSPITAL_COMMUNITY): Payer: Medicare Other

## 2011-12-26 DIAGNOSIS — D62 Acute posthemorrhagic anemia: Secondary | ICD-10-CM

## 2011-12-26 LAB — BLOOD GAS, ARTERIAL
Acid-base deficit: 1.2 mmol/L (ref 0.0–2.0)
MECHVT: 480 mL
O2 Saturation: 99.4 %
Patient temperature: 100.3
RATE: 16 resp/min
TCO2: 22.9 mmol/L (ref 0–100)
pCO2 arterial: 31.6 mmHg — ABNORMAL LOW (ref 35.0–45.0)
pH, Arterial: 7.461 — ABNORMAL HIGH (ref 7.350–7.400)

## 2011-12-26 LAB — COMPREHENSIVE METABOLIC PANEL
AST: 28 U/L (ref 0–37)
Albumin: 1.9 g/dL — ABNORMAL LOW (ref 3.5–5.2)
Alkaline Phosphatase: 77 U/L (ref 39–117)
BUN: 97 mg/dL — ABNORMAL HIGH (ref 6–23)
CO2: 23 mEq/L (ref 19–32)
Chloride: 106 mEq/L (ref 96–112)
Creatinine, Ser: 3.29 mg/dL — ABNORMAL HIGH (ref 0.50–1.10)
GFR calc non Af Amer: 12 mL/min — ABNORMAL LOW (ref >90)
Potassium: 3.4 mEq/L — ABNORMAL LOW (ref 3.5–5.1)
Total Bilirubin: 0.7 mg/dL (ref 0.3–1.2)

## 2011-12-26 LAB — BASIC METABOLIC PANEL
Calcium: 8.8 mg/dL (ref 8.4–10.5)
GFR calc Af Amer: 16 mL/min — ABNORMAL LOW (ref >90)
GFR calc non Af Amer: 14 mL/min — ABNORMAL LOW (ref >90)
Glucose, Bld: 151 mg/dL — ABNORMAL HIGH (ref 70–99)
Potassium: 3.7 mEq/L (ref 3.5–5.1)
Sodium: 144 mEq/L (ref 135–145)

## 2011-12-26 LAB — DIFFERENTIAL
Basophils Absolute: 0.5 10*3/uL — ABNORMAL HIGH (ref 0.0–0.1)
Basophils Relative: 3 % — ABNORMAL HIGH (ref 0–1)
Eosinophils Absolute: 0.2 10*3/uL (ref 0.0–0.7)
Monocytes Absolute: 2.2 10*3/uL — ABNORMAL HIGH (ref 0.1–1.0)
Neutro Abs: 12.2 10*3/uL — ABNORMAL HIGH (ref 1.7–7.7)

## 2011-12-26 LAB — CBC
HCT: 21.8 % — ABNORMAL LOW (ref 36.0–46.0)
Hemoglobin: 7 g/dL — ABNORMAL LOW (ref 12.0–15.0)
MCH: 31 pg (ref 26.0–34.0)
MCHC: 32.1 g/dL (ref 30.0–36.0)
RDW: 18 % — ABNORMAL HIGH (ref 11.5–15.5)

## 2011-12-26 LAB — GLUCOSE, CAPILLARY
Glucose-Capillary: 141 mg/dL — ABNORMAL HIGH (ref 70–99)
Glucose-Capillary: 131 mg/dL — ABNORMAL HIGH (ref 70–99)
Glucose-Capillary: 85 mg/dL (ref 70–99)
Glucose-Capillary: 82 mg/dL (ref 70–99)

## 2011-12-26 LAB — PREALBUMIN: Prealbumin: 8.9 mg/dL — ABNORMAL LOW (ref 17.0–34.0)

## 2011-12-26 MED ORDER — LEVALBUTEROL HCL 0.63 MG/3ML IN NEBU
0.6300 mg | INHALATION_SOLUTION | RESPIRATORY_TRACT | Status: DC | PRN
Start: 2011-12-26 — End: 2012-01-03
  Administered 2011-12-26 – 2012-01-02 (×3): 0.63 mg via RESPIRATORY_TRACT
  Filled 2011-12-26: qty 3

## 2011-12-26 MED ORDER — POTASSIUM CHLORIDE 20 MEQ/15ML (10%) PO LIQD
40.0000 meq | Freq: Two times a day (BID) | ORAL | Status: DC
  Administered 2011-12-26: 40 meq
  Filled 2011-12-26 (×2): qty 30

## 2011-12-26 MED ORDER — DEXTROSE 10 % IV SOLN
INTRAVENOUS | Status: DC
  Administered 2011-12-26 – 2011-12-27 (×2): via INTRAVENOUS

## 2011-12-26 MED ORDER — FUROSEMIDE 10 MG/ML IJ SOLN
80.0000 mg | Freq: Once | INTRAMUSCULAR | Status: AC
  Administered 2011-12-26: 80 mg via INTRAVENOUS

## 2011-12-26 MED ORDER — POTASSIUM CHLORIDE 20 MEQ/15ML (10%) PO LIQD
ORAL | Status: AC
  Filled 2011-12-26: qty 30

## 2011-12-26 MED ORDER — HYDROMORPHONE HCL PF 1 MG/ML IJ SOLN
INTRAMUSCULAR | Status: AC
  Administered 2011-12-26: 1 mg via INTRAVENOUS
  Filled 2011-12-26: qty 1

## 2011-12-26 MED ORDER — LEVALBUTEROL HCL 0.63 MG/3ML IN NEBU
0.6300 mg | INHALATION_SOLUTION | Freq: Four times a day (QID) | RESPIRATORY_TRACT | Status: DC
  Administered 2011-12-26 – 2012-01-03 (×29): 0.63 mg via RESPIRATORY_TRACT
  Filled 2011-12-26 (×37): qty 3

## 2011-12-26 MED ORDER — HYDROMORPHONE HCL PF 1 MG/ML IJ SOLN
1.0000 mg | Freq: Once | INTRAMUSCULAR | Status: AC
  Administered 2011-12-26: 1 mg via INTRAVENOUS

## 2011-12-26 MED ORDER — HYDROMORPHONE HCL PF 1 MG/ML IJ SOLN
1.0000 mg | INTRAMUSCULAR | Status: DC | PRN
  Administered 2011-12-27 (×2): 1 mg via INTRAVENOUS
  Administered 2011-12-27 (×2): 2 mg via INTRAVENOUS
  Administered 2011-12-27 – 2011-12-28 (×3): 1 mg via INTRAVENOUS
  Administered 2011-12-28 (×2): 2 mg via INTRAVENOUS
  Administered 2011-12-28 – 2011-12-29 (×7): 1 mg via INTRAVENOUS
  Administered 2011-12-29: 2 mg via INTRAVENOUS
  Administered 2011-12-29 (×5): 1 mg via INTRAVENOUS
  Administered 2011-12-30 (×2): 2 mg via INTRAVENOUS
  Administered 2011-12-30: 1 mg via INTRAVENOUS
  Administered 2011-12-30: 2 mg via INTRAVENOUS
  Administered 2011-12-30 – 2011-12-31 (×4): 1 mg via INTRAVENOUS
  Administered 2011-12-31 (×2): 2 mg via INTRAVENOUS
  Administered 2011-12-31 (×2): 1 mg via INTRAVENOUS
  Administered 2012-01-01: 2 mg via INTRAVENOUS
  Administered 2012-01-01: 1 mg via INTRAVENOUS
  Administered 2012-01-01 (×2): 2 mg via INTRAVENOUS
  Administered 2012-01-01: 1 mg via INTRAVENOUS
  Administered 2012-01-02: 2 mg via INTRAVENOUS
  Administered 2012-01-02 – 2012-01-03 (×2): 1 mg via INTRAVENOUS
  Filled 2011-12-26: qty 2
  Filled 2011-12-26 (×3): qty 1
  Filled 2011-12-26: qty 2
  Filled 2011-12-26 (×6): qty 1
  Filled 2011-12-26: qty 2
  Filled 2011-12-26 (×4): qty 1
  Filled 2011-12-26 (×2): qty 2
  Filled 2011-12-26 (×2): qty 1
  Filled 2011-12-26: qty 2
  Filled 2011-12-26 (×2): qty 1
  Filled 2011-12-26 (×2): qty 2
  Filled 2011-12-26 (×4): qty 1
  Filled 2011-12-26: qty 2
  Filled 2011-12-26: qty 1
  Filled 2011-12-26: qty 2
  Filled 2011-12-26: qty 1
  Filled 2011-12-26: qty 2
  Filled 2011-12-26 (×4): qty 1
  Filled 2011-12-26: qty 2
  Filled 2011-12-26 (×2): qty 1
  Filled 2011-12-26 (×2): qty 2

## 2011-12-26 NOTE — Progress Notes (Signed)
UR complete 

## 2011-12-26 NOTE — Progress Notes (Signed)
Follow up - Trauma and Critical Care  Patient Details:    [name] is an 78 y.o. female.  Lines/tubes : Airway 7.5 mm (Active)  Secured at (cm) 22 cm 12/26/2011  3:59 AM  Measured From Lips 12/26/2011  3:59 AM  Secured Location Center 12/26/2011  3:59 AM  Secured By [employer] 12/26/2011  3:59 AM  Tube Holder Repositioned Yes 12/26/2011  3:59 AM  Cuff Pressure (cm H2O) 24 cm H2O 12/25/2011 11:05 PM  Site Condition Dry 12/26/2011  3:59 AM     CVC Triple Lumen 12/19/11 Left Internal jugular (Active)  Site Assessment Clean;Dry;Intact 12/25/2011  8:00 PM  Proximal Lumen Status Capped (Central line);Flushed 12/25/2011  8:00 PM  Medial Capped (Central line);Flushed 12/25/2011  8:00 PM  Distal Lumen Status Capped (Central line) 12/25/2011  8:00 PM  Dressing Type Transparent 12/25/2011  8:00 PM  Dressing Status Clean;Dry;Intact;Antimicrobial disc in place 12/25/2011  8:00 PM  Line Care Connections checked and tightened;Zeroed and calibrated;Leveled 12/25/2011  8:00 AM  Dressing Intervention Dressing changed;Antimicrobial disc changed;Other (Comment) 12/26/2011  6:00 AM  Dressing Change Due 12/28/11 12/22/2011  8:00 AM  Indication for Insertion or Continuance of Line Prolonged intravenous therapies 12/25/2011  8:00 PM     Chest Tube 1 Right 28 Fr. (Active)  Suction To water seal 12/25/2011  8:00 PM  Chest Tube Air Leak None 12/25/2011  8:00 PM  Patency Intervention Tip/tilt 12/25/2011  8:00 AM  Drainage Description Dark red 12/25/2011  8:00 PM  Dressing Status Clean;Dry;Intact 12/25/2011  8:00 PM  Dressing Intervention Dressing changed 12/25/2011  9:00 PM  Site Assessment Intact;Other (Comment) 12/25/2011  9:00 PM  Surrounding Skin Intact 12/25/2011  8:00 PM  Output (mL) 50 mL 12/26/2011  4:00 AM     Chest Tube 2 Left 28 Fr. (Active)  Suction To water seal 12/25/2011  8:00 PM  Chest Tube Air Leak None 12/25/2011  8:00 PM  Patency Intervention Tip/tilt 12/25/2011  8:00 AM  Drainage  Description Dark red 12/25/2011  8:00 PM  Dressing Status Clean;Dry;Intact 12/25/2011  8:00 PM  Dressing Intervention New dressing 12/25/2011  4:00 AM  Site Assessment Clean;Intact;Dry 12/25/2011  8:00 AM  Surrounding Skin Intact 12/25/2011  8:00 PM  Output (mL) 50 mL 12/26/2011  4:00 AM     NG/OG Tube Orogastric Center mouth (Active)  Placement Verification Auscultation 12/25/2011  8:00 PM  Site Assessment Clean;Dry;Intact 12/25/2011  8:00 PM  Status Infusing tube feed;Irrigated 12/25/2011  8:00 PM  Drainage Appearance Tan 12/25/2011  8:00 AM  Gastric Residual 0 mL 12/25/2011  8:00 PM  Intake (mL) 40 mL 12/26/2011  7:00 AM  Output (mL) 0 mL 12/21/2011  4:00 PM     Urethral Catheter Latex 14 Fr. (Active)  Site Assessment Clean;Intact 12/25/2011  8:00 PM  Collection Container Standard drainage bag 12/25/2011  8:00 PM  Securement Method Leg strap 12/25/2011  8:00 PM  Urinary Catheter Interventions Unclamped 12/25/2011  8:00 AM  Indication for Insertion or Continuance of Catheter Urinary output monitoring;Prolonged immobilization 12/25/2011  8:00 PM  Output (mL) 180 mL 12/26/2011  6:00 AM    Microbiology/Sepsis markers: Results for orders placed during the hospital encounter of 12/19/11  MRSA PCR SCREENING     Status: Normal   Collection Time   12/19/11  6:54 PM      Component Value Range Status Comment   MRSA by PCR NEGATIVE  NEGATIVE Final     Anti-infectives:  Anti-infectives     Start  Dose/Rate Route Frequency Ordered Stop   12/23/11 2000   ceFAZolin (ANCEF) IVPB 1 g/50 mL premix        1 g 100 mL/hr over 30 Minutes Intravenous Every 12 hours 12/23/11 1406 12/24/11 0858   12/23/11 0700   ceFAZolin (ANCEF) IVPB 1 g/50 mL premix        1 g 100 mL/hr over 30 Minutes Intravenous  Once 12/22/11 0831 12/23/11 0843   12/20/11 1500   ceFAZolin (ANCEF) IVPB 1 g/50 mL premix        1 g 100 mL/hr over 30 Minutes Intravenous Every 12 hours 12/20/11 0952 12/21/11 0312   12/19/11 2300   ceFAZolin  (ANCEF) IVPB 2 g/50 mL premix        2 g 100 mL/hr over 30 Minutes Intravenous  Once 12/19/11 1853 12/20/11 0328          Best Practice/Protocols:  VTE Prophylaxis: Mechanical GI Prophylaxis: Proton Pump Inhibitor Intermittent Sedation  Consults: Treatment Team:  Budd Palmer, MD Cecille Aver, MD    Events:  Subjective:    Overnight Issues: Apparently the family has decided to make the patient a DNR patient.  She is currently still on the ventilator, but likely can be extubated.  The issue is whether or not they want her re-intubated.    Objective:  Vital signs for last 24 hours: Temp:  [88.8 F (31.6 C)-101.4 F (38.6 C)] 100 F (37.8 C) (06/24 0700) Pulse Rate:  [97-110] 97  (06/24 0700) Resp:  [8-39] 21  (06/24 0700) BP: (111-173)/(41-74) 157/59 mmHg (06/24 0600) SpO2:  [91 %-100 %] 96 % (06/24 0700) FiO2 (%):  [29.7 %-30.2 %] 30 % (06/24 0700) Weight:  [91.1 kg (200 lb 13.4 oz)] 91.1 kg (200 lb 13.4 oz) (06/24 0600)  Hemodynamic parameters for last 24 hours: CVP:  [11 mmHg-16 mmHg] 16 mmHg  Intake/Output from previous day: 06/23 0701 - 06/24 0700 In: 1810 [I.V.:600; NG/GT:1210] Out: 2095 [Urine:1795; Chest Tube:300]  Intake/Output this shift:    Vent settings for last 24 hours: Vent Mode:  [-] PRVC FiO2 (%):  [29.7 %-30.2 %] 30 % Set Rate:  [16 bmp] 16 bmp Vt Set:  [480 mL] 480 mL PEEP:  [5 cmH20] 5 cmH20 Pressure Support:  [10 cmH20] 10 cmH20 Plateau Pressure:  [16 cmH20-20 cmH20] 16 cmH20  Physical Exam:  General: alert, no respiratory distress and very responsive, seems comfortable. Neuro: alert, oriented and nonfocal exam Resp: rhonchi bilaterally GI: soft, nontender, BS WNL, no r/g and tolerating tube feedings of [place] Extremities: edema 3+ and still with open injury The patient looks strong enough to extubate.  Results for orders placed during the hospital encounter of 12/19/11 (from the past 24 hour(s))  CBC     Status:  Abnormal   Collection Time   12/25/11  9:50 AM      Component Value Range   WBC 20.2 (*) 4.0 - 10.5 K/uL   RBC 2.40 (*) 3.87 - 5.11 MIL/uL   Hemoglobin 7.7 (*) 12.0 - 15.0 g/dL   HCT 16.1 (*) 09.6 - 04.5 %   MCV 93.8  78.0 - 100.0 fL   MCH 32.1  26.0 - 34.0 pg   MCHC 34.2  30.0 - 36.0 g/dL   RDW 40.9 (*) 81.1 - 91.4 %   Platelets 132 (*) 150 - 400 K/uL  DIFFERENTIAL     Status: Abnormal   Collection Time   12/25/11  9:50 AM  Component Value Range   Neutrophils Relative 75  43 - 77 %   Lymphocytes Relative 10 (*) 12 - 46 %   Monocytes Relative 12  3 - 12 %   Eosinophils Relative 1  0 - 5 %   Basophils Relative 2 (*) 0 - 1 %   Neutro Abs 15.2 (*) 1.7 - 7.7 K/uL   Lymphs Abs 2.0  0.7 - 4.0 K/uL   Monocytes Absolute 2.4 (*) 0.1 - 1.0 K/uL   Eosinophils Absolute 0.2  0.0 - 0.7 K/uL   Basophils Absolute 0.4 (*) 0.0 - 0.1 K/uL   RBC Morphology MARKED POLYCHROMASIA     WBC Morphology       Value: MODERATE LEFT SHIFT (>5% METAS AND MYELOS,OCC PRO NOTED)   Smear Review LARGE PLATELETS PRESENT    MAGNESIUM     Status: Normal   Collection Time   12/25/11  9:50 AM      Component Value Range   Magnesium 2.3  1.5 - 2.5 mg/dL  GLUCOSE, CAPILLARY     Status: Abnormal   Collection Time   12/25/11 11:39 AM      Component Value Range   Glucose-Capillary 132 (*) 70 - 99 mg/dL  GLUCOSE, CAPILLARY     Status: Abnormal   Collection Time   12/25/11  3:56 PM      Component Value Range   Glucose-Capillary 179 (*) 70 - 99 mg/dL  GLUCOSE, CAPILLARY     Status: Abnormal   Collection Time   12/25/11  7:17 PM      Component Value Range   Glucose-Capillary 102 (*) 70 - 99 mg/dL  GLUCOSE, CAPILLARY     Status: Abnormal   Collection Time   12/26/11 12:05 AM      Component Value Range   Glucose-Capillary 152 (*) 70 - 99 mg/dL  BLOOD GAS, ARTERIAL     Status: Abnormal   Collection Time   12/26/11  3:45 AM      Component Value Range   FIO2 0.30     Delivery systems VENTILATOR     Mode PRESSURE  REGULATED VOLUME CONTROL     VT 480     Rate 16     Peep/cpap 5.0     pH, Arterial 7.461 (*) 7.350 - 7.400   pCO2 arterial 31.6 (*) 35.0 - 45.0 mmHg   pO2, Arterial 86.0  80.0 - 100.0 mmHg   Bicarbonate 22.0  20.0 - 24.0 mEq/L   TCO2 22.9  0 - 100 mmol/L   Acid-base deficit 1.2  0.0 - 2.0 mmol/L   O2 Saturation 99.4     Patient temperature 100.3     Collection site LEFT BRACHIAL     Drawn by [id number]     Sample type ARTERIAL DRAW     Allens test (pass/fail) PASS  PASS  GLUCOSE, CAPILLARY     Status: Abnormal   Collection Time   12/26/11  4:16 AM      Component Value Range   Glucose-Capillary 141 (*) 70 - 99 mg/dL  CK     Status: Normal   Collection Time   12/26/11  5:00 AM      Component Value Range   Total CK 75  7 - 177 U/L  COMPREHENSIVE METABOLIC PANEL     Status: Abnormal   Collection Time   12/26/11  5:00 AM      Component Value Range   Sodium 142  135 - 145 mEq/L   Potassium  3.4 (*) 3.5 - 5.1 mEq/L   Chloride 106  96 - 112 mEq/L   CO2 23  19 - 32 mEq/L   Glucose, Bld 149 (*) 70 - 99 mg/dL   BUN 97 (*) 6 - 23 mg/dL   Creatinine, Ser 1.61 (*) 0.50 - 1.10 mg/dL   Calcium 9.0  8.4 - 09.6 mg/dL   Total Protein 4.6 (*) 6.0 - 8.3 g/dL   Albumin 1.9 (*) 3.5 - 5.2 g/dL   AST 28  0 - 37 U/L   ALT <5  0 - 35 U/L   Alkaline Phosphatase 77  39 - 117 U/L   Total Bilirubin 0.7  0.3 - 1.2 mg/dL   GFR calc non Af Amer 12 (*) >90 mL/min   GFR calc Af Amer 14 (*) >90 mL/min  PHOSPHORUS     Status: Normal   Collection Time   12/26/11  5:00 AM      Component Value Range   Phosphorus 4.1  2.3 - 4.6 mg/dL  CBC     Status: Abnormal   Collection Time   12/26/11  5:00 AM      Component Value Range   WBC 17.0 (*) 4.0 - 10.5 K/uL   RBC 2.26 (*) 3.87 - 5.11 MIL/uL   Hemoglobin 7.0 (*) 12.0 - 15.0 g/dL   HCT 04.5 (*) 40.9 - 81.1 %   MCV 96.5  78.0 - 100.0 fL   MCH 31.0  26.0 - 34.0 pg   MCHC 32.1  30.0 - 36.0 g/dL   RDW 91.4 (*) 78.2 - 95.6 %   Platelets 155  150 - 400 K/uL    DIFFERENTIAL     Status: Abnormal   Collection Time   12/26/11  5:00 AM      Component Value Range   Neutrophils Relative 72  43 - 77 %   Lymphocytes Relative 11 (*) 12 - 46 %   Monocytes Relative 13 (*) 3 - 12 %   Eosinophils Relative 1  0 - 5 %   Basophils Relative 3 (*) 0 - 1 %   Neutro Abs 12.2 (*) 1.7 - 7.7 K/uL   Lymphs Abs 1.9  0.7 - 4.0 K/uL   Monocytes Absolute 2.2 (*) 0.1 - 1.0 K/uL   Eosinophils Absolute 0.2  0.0 - 0.7 K/uL   Basophils Absolute 0.5 (*) 0.0 - 0.1 K/uL   RBC Morphology POLYCHROMASIA PRESENT     WBC Morphology TOXIC GRANULATION       Assessment/Plan:   NEURO  Seems neurologically intact   Plan: Wean sedation, try for extubation.  PULM  Atelectasis/collapse (diffuse ) Chest Wall Trauma multiple rib fractures   Plan: Remove both chest tubes today.  CARDIO  No significant pathology   Plan: CPM  RENAL  Oliguria (probably hypovolemia and suspect ATN)   Plan: Had been getting Lasix, but renal function continues to deteriorate  GI  No problems, toleratign tube feedings well   Plan: CPM  ID  Although WBC is elevated, no known infectious source   Plan: On no antibiotics currently.  HEME  Anemia acute blood loss anemia, anemia of critical illness and anemia of renal disease) Leukocytosis (neutrophilia)   Plan: Will give one unit of blood and some Lasix  ENDO No significant abnormality   Plan: CPM  Global Issues  The patient is near extubation.  The family apparently wants to keep the patient a DNR, but i would like to talk with them before she is  extubated so that they understand that she likely is not to be re-intubated.    LOS: 7 days   Additional comments:I reviewed the patient's new clinical lab test results. cbc/bmet and I reviewed the patients new imaging test results. CXR  Critical Care Total Time*: 30 Minutes  Arnie Clingenpeel O 12/26/2011  *Care during the described time interval was provided by me and/or other providers on the critical  care team.  I have reviewed this patient's available data, including medical history, events of note, physical examination and test results as part of my evaluation.

## 2011-12-26 NOTE — Progress Notes (Signed)
1800: Changed patient position to flat to provide care and her pain increased dramatically and her difficulty breathing increased, after boosting her in bed and returning to upright position audible wheezes coming from the patient and she nods head that she is in pain. Contacted RT Carrie to administer Breathing treatment and I administered Pain medication.  Wheezing not improving and distress continues. I contacted Dr. Delton Coombes to assist with emergent patient need. New orders obtained and called Daughter Jasmine December to provide and up date on new event and she wanted to make sure that comfort was the primary focus of her plan of care.  I have shared these feeling with Dr. Delton Coombes and [place] aware of these wishes.  Patients sister is at the bedside and believes that the interventions for pain have helped some at this time.

## 2011-12-26 NOTE — Progress Notes (Signed)
eLink Physician-Brief Progress Note Patient Name: [name] DOB: May 08, 1925 MRN: [id number]  Date of Service  12/26/2011   HPI/Events of Note   Called to eval pt acutely for dyspnea after being turned in the bed. Family has conveyed to RN that they want to focus on pt's comfort as primary goal.  Will check CXR since chest tubes removed today, trial of dilaudid 1mg  since pt having breakthrough dyspnea on fentanyl.   eICU Interventions     Intervention Category Intermediate Interventions: Respiratory distress - evaluation and management  Wajiha Versteeg S. 12/26/2011, 6:23 PM

## 2011-12-26 NOTE — Progress Notes (Signed)
[facility]  Subjective:  Opens eyes, responds to questions     Objective: Vital signs in last 24 hours: Blood pressure 157/59, pulse 101, temperature 100 F (37.8 C), temperature source Core (Comment), resp. rate 22, height 5\' 6"  (1.676 m), weight 91.1 kg (200 lb 13.4 oz), SpO2 98.00%.  CVP 18  PHYSICAL EXAM General--awake, responsive, remains intubated  Chest--chest tubes out, rhonchi Heart--no rub Abd--incisions from prior surgery, nontender Extr--splints + ACE wraps, still with lots of edema  Lab Results:   Lab 12/26/11 0500 12/25/11 0444 12/24/11 0415  NA 142 139 139  K 3.4* 3.8 4.1  CL 106 104 105  CO2 23 22 22   BUN 97* 87* 73*  CREATININE 3.29* 3.51* 3.46*  ALB -- -- --  GLUCOSE 149* -- --  CALCIUM 9.0 8.7 8.4  PHOS 4.1 -- --     Basename 12/26/11 0500 12/25/11 0950  WBC 17.0* 20.2*  HGB 7.0* 7.7*  HCT 21.8* 22.5*  PLT 155 132*    Assessment/Plan: 1. Renal- still making urine--off lasix. I<0.  Weight 92 to 91.1.   CVP 18. BUN higher, but Cr lower.   Will hold off  lasix again today.   Replace K 2. Hypertension/volume - as above 3. Anemia - Hgb 7.  PRBC per Trauma svc  4. Electrolytes- potassium low--will replace, Mg 2.3 yesterday. Calcium actually high (corrected). No repletion needed at this time.  5. Fevers- per trauma,I and D of shin 21 June. No antibiotics now     LOS: 7 days   Draper Gallon F 12/26/2011,8:40 AM   .labalb

## 2011-12-26 NOTE — Procedures (Signed)
Extubation Procedure Note  Patient Details:   Name: [name] DOB: [birth year] MRN: [id number]   Airway Documentation:     Evaluation  O2 sats: stable throughout Complications: No apparent complications Patient did tolerate procedure well. Bilateral Breath Sounds: Rhonchi Suctioning: Oral;Airway Yes  Pt extubated per MD order and placed on 4lpm [place]. Pt tolerated extubation procedure well. RT will continue to monitor.   Parke Poisson 12/26/2011, 3:00 PM

## 2011-12-26 NOTE — Progress Notes (Signed)
1:35PM followed up with Dr. Lindie Spruce regarding extubation of patient.  Dr. Lindie Spruce informed me that after extubation plan is no reintubation if patient fails extubation.

## 2011-12-26 NOTE — Progress Notes (Signed)
Pt tolerated wean 5/5 very well. Chest tubes were removed and pt was having more pain. Placed back on full support until pain is under control. RT will continue to monitor.
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
## 2011-12-27 ENCOUNTER — Inpatient Hospital Stay (HOSPITAL_COMMUNITY): Payer: Medicare Other

## 2011-12-27 LAB — COMPREHENSIVE METABOLIC PANEL
ALT: <5 U/L (ref 0–35)
AST: 28 U/L (ref 0–37)
Alkaline Phosphatase: 80 U/L (ref 39–117)
CO2: 25 mEq/L (ref 19–32)
Calcium: 8.9 mg/dL (ref 8.4–10.5)
Chloride: 106 mEq/L (ref 96–112)
GFR calc Af Amer: 17 mL/min — ABNORMAL LOW (ref >90)
GFR calc non Af Amer: 14 mL/min — ABNORMAL LOW (ref >90)
Glucose, Bld: 142 mg/dL — ABNORMAL HIGH (ref 70–99)
Potassium: 3.6 mEq/L (ref 3.5–5.1)
Sodium: 144 mEq/L (ref 135–145)
Total Bilirubin: 1.2 mg/dL (ref 0.3–1.2)

## 2011-12-27 LAB — TYPE AND SCREEN

## 2011-12-27 LAB — MAGNESIUM: Magnesium: 2.3 mg/dL (ref 1.5–2.5)

## 2011-12-27 LAB — GLUCOSE, CAPILLARY
Glucose-Capillary: 99 mg/dL (ref 70–99)
Glucose-Capillary: 105 mg/dL — ABNORMAL HIGH (ref 70–99)
Glucose-Capillary: 78 mg/dL (ref 70–99)
Glucose-Capillary: 67 mg/dL — ABNORMAL LOW (ref 70–99)

## 2011-12-27 LAB — CBC
MCH: 32 pg (ref 26.0–34.0)
MCHC: 33.1 g/dL (ref 30.0–36.0)
MCV: 96.6 fL (ref 78.0–100.0)
Platelets: 167 10*3/uL (ref 150–400)
RDW: 18.3 % — ABNORMAL HIGH (ref 11.5–15.5)

## 2011-12-27 MED ORDER — LEVOTHYROXINE SODIUM 100 MCG IV SOLR
50.0000 ug | Freq: Every day | INTRAVENOUS | Status: DC
  Administered 2011-12-27 – 2012-01-03 (×8): 50 ug via INTRAVENOUS
  Filled 2011-12-27 (×9): qty 2.5

## 2011-12-27 MED ORDER — PANTOPRAZOLE SODIUM 40 MG IV SOLR
40.0000 mg | Freq: Every day | INTRAVENOUS | Status: DC
  Administered 2011-12-27 – 2012-01-03 (×8): 40 mg via INTRAVENOUS
  Filled 2011-12-27 (×10): qty 40

## 2011-12-27 MED ORDER — POTASSIUM CHLORIDE 10 MEQ/50ML IV SOLN
10.0000 meq | INTRAVENOUS | Status: AC
  Administered 2011-12-27 (×4): 10 meq via INTRAVENOUS
  Filled 2011-12-27 (×4): qty 50

## 2011-12-27 NOTE — Progress Notes (Signed)
10pm potassium dc'd. Will recheck BMET and administer iv potassium if <3.5 per MD Wyatt.  Doree Albee
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
## 2011-12-27 NOTE — Progress Notes (Signed)
Orthopedic Tech Progress Note Patient Details:  [name] 06/26/1925 [id number] Materials drop off per doctor's note Casting Type of Cast: Short arm cast Cast Location: Left UE Cast Material: Fiberglass Cast Intervention: Other (comment) (materials dropped off to RN, cast not applied by [employer])     [place] R Janee Morn 12/27/2011, 4:32 PM

## 2011-12-28 ENCOUNTER — Other Ambulatory Visit: Payer: Self-pay

## 2011-12-28 ENCOUNTER — Inpatient Hospital Stay (HOSPITAL_COMMUNITY): Payer: Medicare Other

## 2011-12-28 LAB — BASIC METABOLIC PANEL
Chloride: 112 mEq/L (ref 96–112)
GFR calc Af Amer: 23 mL/min — ABNORMAL LOW (ref >90)
GFR calc non Af Amer: 20 mL/min — ABNORMAL LOW (ref >90)
Potassium: 3.7 mEq/L (ref 3.5–5.1)
Sodium: 148 mEq/L — ABNORMAL HIGH (ref 135–145)

## 2011-12-28 LAB — CBC
HCT: 30.1 % — ABNORMAL LOW (ref 36.0–46.0)
MCHC: 31.9 g/dL (ref 30.0–36.0)
RDW: 19.4 % — ABNORMAL HIGH (ref 11.5–15.5)
WBC: 19.9 10*3/uL — ABNORMAL HIGH (ref 4.0–10.5)

## 2011-12-28 LAB — MAGNESIUM: Magnesium: 2.2 mg/dL (ref 1.5–2.5)

## 2011-12-28 MED ORDER — METOPROLOL TARTRATE 1 MG/ML IV SOLN
5.0000 mg | Freq: Once | INTRAVENOUS | Status: AC
  Administered 2011-12-28: 5 mg via INTRAVENOUS
  Filled 2011-12-28: qty 5
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
## 2011-12-28 NOTE — Clinical Social Work Placement (Addendum)
Clinical Social Work Department CLINICAL SOCIAL WORK PLACEMENT NOTE 12/28/2011  Patient:  [name], [name]  Account Number:  [id number] Admit date:  12/19/2011  Clinical Social Worker:  Ashley Jacobs, LCSW  Date/time:  12/28/2011 09:00 AM  Clinical Social Work is seeking post-discharge placement for this patient at the following level of care:   SKILLED NURSING   (*CSW will update this form in Epic as items are completed)     Patient/family provided with [facility] Department of Clinical Social Work's list of facilities offering this level of care within the geographic area requested by the patient (or if unable, by the patient's family).  12/28/2011  Patient/family informed of their freedom to choose among providers that offer the needed level of care, that participate in Medicare, Medicaid or managed care program needed by the patient, have an available bed and are willing to accept the patient.  12/28/2011  Patient/family informed of [facility]' ownership interest in [facility], as well as of the fact that they are under no obligation to receive care at this facility.  PASARR submitted to EDS on 12/26/2011 PASARR number received from EDS on 12/28/2011  FL2 transmitted to all facilities in geographic area requested by pt/family on  12/28/2011 FL2 transmitted to all facilities within larger geographic area on   Patient informed that his/her managed care company has contracts with or will negotiate with  certain facilities, including the following:     Patient/family informed of bed offers received:  01/01/2012 Patient chooses bed at  [facility] Physician recommends and patient chooses bed at    Patient to be transferred to [facility] on  01/03/2012 (JS) Patient to be transferred to facility by  Ambulance  (JS)  The following physician request were entered in Epic:   Additional Comments:  Please sign FL2.   Ashley Jacobs, MSW  LCSW [phone] (Coverage for [facility] Golds)  Patient was discussed in Long Length of Stay meeting.  Plan is for patient's FL2 to be updated and patient to be faxed out, so by time of discharge she will have a skilled nursing home bed for physical therapy rehab. Patient and daughter aware and agreeable. Patient was faxed out at 9:00am on 6/26. HN

## 2011-12-28 NOTE — Progress Notes (Signed)
Trauma Service Note  Subjective: Patient is hypertensive, mildly tachycardic.  Breathing more easily.  Complaining of chest pain and back pain.  She still has noticeable flail segment on her right chest.  Objective: Vital signs in last 24 hours: Temp:  [96.9 F (36.1 C)-99 F (37.2 C)] 98.4 F (36.9 C) (06/26 0700) Pulse Rate:  [93-103] 101  (06/26 0700) Resp:  [14-26] 21  (06/26 0700) BP: (148-188)/(58-91) 178/81 mmHg (06/26 0700) SpO2:  [87 %-100 %] 100 % (06/26 0700) Weight:  [86.5 kg (190 lb 11.2 oz)-87 kg (191 lb 12.8 oz)] 86.5 kg (190 lb 11.2 oz) (06/26 0500) Last BM Date: 12/27/11  Intake/Output from previous day: 06/25 0701 - 06/26 0700 In: 670 [I.V.:460; IV Piggyback:210] Out: 1860 [Urine:1860] Intake/Output this shift:    General: Moderate acute distress from chest wall pain.  Lungs: Clearer than yesterday, but occasional coarse coughing.  Abd: Moderately distended, good bowel sounds.  Extremities: Soft, less edematous but still 2+ edema in UEs.  Lower extremities are wrapped.  Neuro: Intact.  Asked directly if she wanted a feeding tube and the patient said that she would.  Lab Results: CBC   Basename 12/28/11 0500 12/27/11 0430  WBC 19.9* 20.6*  HGB 9.6* 9.3*  HCT 30.1* 28.1*  PLT 196 167   BMET  Basename 12/28/11 0500 12/27/11 0430  NA 148* 144  K 3.7 3.6  CL 112 106  CO2 26 25  GLUCOSE 142* 142*  BUN 84* 98*  CREATININE 2.13* 2.83*  CALCIUM 9.0 8.9   PT/INR No results found for this basename: LABPROT:2,INR:2 in the last 72 hours ABG  Basename 12/26/11 0345  PHART 7.461*  HCO3 22.0    Studies/Results: Dg Wrist Complete Left  12/27/2011  *RADIOLOGY REPORT*  Clinical Data: Follow up of distal radius fracture.  LEFT WRIST - COMPLETE 3+ VIEW  Comparison: 12/19/2011  Findings: Overlying cast obscures bony detail.  Moderate osteopenia.  Similar alignment of the impacted distal radius and ulna fractures.    Scaphoid is intact.  IMPRESSION:  Similar appearance of distal radius and ulnar fractures, after cast placement.  Original Report Authenticated By: Consuello Bossier, M.D.   Dg Chest Port 1 View  12/26/2011  *RADIOLOGY REPORT*  Clinical Data: Respiratory distress, wheezing  PORTABLE CHEST - 1 VIEW  Comparison: 12/26/2011  Findings: Stable left central line position.  Overlying trach collar noted.  Heart is enlarged with slight worsening diffuse airspace disease versus edema.  Small effusions noted.  CHF is favored.  No pneumothorax.  IMPRESSION: Slight worsening airspace disease versus edema with effusions. Favor worsening CHF pattern.  Original Report Authenticated By: Judie Petit. Ruel Favors, M.D.   Dg Chest Port 1 View  12/26/2011  *RADIOLOGY REPORT*  Clinical Data: Status post chest tube removal and extubation.  PORTABLE CHEST - 1 VIEW  Comparison: Chest x-ray 12/26/2011.  Findings: Endotracheal tube and bilateral chest tubes have been removed.  Nasogastric tube has also been removed. There is a left- sided internal jugular central venous catheter with tip terminating in the left innominate vein. There continue to be patchy interstitial and airspace opacities scattered throughout the lungs bilaterally.  While they could certainly be a component of underlying pulmonary edema, the patchy pattern is more concerning for potential multilobar are pneumonia.  Bibasilar opacities also likely reflect areas of dependent subsegmental atelectasis.  No definite pneumothorax following chest tube removal.  Mild enlargement of the cardiopericardial silhouette, similar to recent prior examination. The patient is rotated to the right on  today's exam, resulting in distortion of the mediastinal contours and reduced diagnostic sensitivity and specificity for mediastinal pathology.  Atherosclerotic calcifications within the arch of the aorta.  Old post-traumatic deformity of the right mid clavicle is again noted.  Multiple surgical clips overlying the right axilla and  lower right chest wall.  IMPRESSION: 1.  Support apparatus, as above. 2.  No pneumothorax appreciated after removal of the bilateral chest tubes. 3.  Patchy multifocal interstitial and airspace opacities concerning for multilobar pneumonia.  Other differential considerations would include developing ARDS and pulmonary edema. Clinical correlation is recommended. 3.  Persistent bibasilar dependent subsegmental atelectasis. 4.  Small bilateral pleural effusions. 5.  Atherosclerosis. 6.  Mild enlargement of the cardiopericardial silhouette is unchanged.  Original Report Authenticated By: Florencia Reasons, M.D.    Anti-infectives: Anti-infectives     Start     Dose/Rate Route Frequency Ordered Stop   12/23/11 2000   ceFAZolin (ANCEF) IVPB 1 g/50 mL premix        1 g 100 mL/hr over 30 Minutes Intravenous Every 12 hours 12/23/11 1406 12/24/11 0858   12/23/11 0700   ceFAZolin (ANCEF) IVPB 1 g/50 mL premix        1 g 100 mL/hr over 30 Minutes Intravenous  Once 12/22/11 0831 12/23/11 0843   12/20/11 1500   ceFAZolin (ANCEF) IVPB 1 g/50 mL premix        1 g 100 mL/hr over 30 Minutes Intravenous Every 12 hours 12/20/11 0952 12/21/11 0312   12/19/11 2300   ceFAZolin (ANCEF) IVPB 2 g/50 mL premix        2 g 100 mL/hr over 30 Minutes Intravenous  Once 12/19/11 1853 12/20/11 0328          Assessment/Plan: s/p Procedure(s): IRRIGATION AND DEBRIDEMENT EXTREMITY PERCUTANEOUS PINNING EXTREMITY WBC up but no fever.  No infectious source to identify. Continue PT/OT Swallowing evaluation today.  If patient not able to take adequate nutrition, willplace a post-pyloric feeding tube per patient's desires. Renal failure is improving, she was negative about 1200 cc yesterday.Creatinine down to 2.1  Will transfer to 3300 SDU  LOS: 9 days   Marta Lamas. Gae Bon, MD, FACS [phone] Trauma Surgeon 12/28/2011

## 2011-12-28 NOTE — Progress Notes (Signed)
Alerted Dr. Lindie Spruce to pt continuing to be hypertensive. No additional orders at this time. Will continue to monitor.
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
## 2011-12-28 NOTE — Progress Notes (Addendum)
Speech Language Pathology Dysphagia Treatment Patient Details Name: [name] MRN: [id number] DOB: 09-30-1924 Today's Date: 12/28/2011 Time: 9562-1308 SLP Time Calculation (min): 38 min  Assessment / Plan / Recommendation Clinical Impression  Patient presents with improvement today - with intermittent phonatory ability and improved secretions management but continues with weak nonproductive cough.  Pt observed with thin, nectar, pudding and cracker boluses with delayed swallow noted with all po intake-suspect attention impacting swallow ability.  Pt required max verbal cues to masticate and swallow cracker bolus due to inattention.  Overt clinical s/s of aspiration with thin via straw characterized by strong cough immediately after the swallow.  Multiple swallows noted with all boluses, concerning for possible pharyngeal resdiuals due to prolonged intubation with ? edema impacting pharyngeal contraction and sensation.  Pt also noted to demonstrate increased work of breathing after approx 12 boluses total.  Recommend to proceed with instrumental evaluation - FEES study (if no facial fxs)  would be ideal to assess vocal fold closure and impact on swallow.  Pt agreeable to FEES study.  MD please order if you agree.  Of note, dentures were placed by SLP for evaluation.     Diet Recommendation  Continue with Current Diet: NPO    SLP Plan FEES   Pertinent Vitals/Pain Afebrile, decreased   Swallowing Goals  SLP Swallowing Goals Patient will utilize recommended strategies during swallow to increase swallowing safety with: Total assistance Swallow Study Goal #3 - Progress: Met  Oral Cavity - Oral Hygiene Does patient have any of the following "at risk" factors?: Oxygen therapy - cannula, mask, simple oxygen devices Patient is HIGH RISK - Oral Care Protocol followed (see row info): Yes   Dysphagia Treatment Treatment focused on: Upgraded PO texture trials;Patient/family/caregiver  education Family/Caregiver Educated: pt Treatment Methods/Modalities: Skilled observation Patient observed directly with PO's: Yes Type of PO's observed: Regular;Nectar-thick liquids;Thin liquids Feeding: Total assist Liquids provided via: Teaspoon;Cup;Straw Pharyngeal Phase Signs & Symptoms: Suspected delayed swallow initiation;Multiple swallows;Immediate cough;Changes in respirations (overt clinical s/s of aspiration with water via straw) Type of cueing: Verbal;Tactile Amount of cueing: Maximal      Donavan Burnet, MS [facility] SLP [phone]

## 2011-12-28 NOTE — Progress Notes (Signed)
Discussed in hospital LOS meeting today.
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
## 2011-12-29 ENCOUNTER — Inpatient Hospital Stay (HOSPITAL_COMMUNITY): Payer: Medicare Other

## 2011-12-29 ENCOUNTER — Encounter (HOSPITAL_COMMUNITY): Payer: Self-pay | Admitting: Orthopedic Surgery

## 2011-12-29 LAB — GLUCOSE, CAPILLARY
Glucose-Capillary: 114 mg/dL — ABNORMAL HIGH (ref 70–99)
Glucose-Capillary: 160 mg/dL — ABNORMAL HIGH (ref 70–99)
Glucose-Capillary: 76 mg/dL (ref 70–99)
Glucose-Capillary: 86 mg/dL (ref 70–99)

## 2011-12-29 MED ORDER — FOOD THICKENER (THICKENUP CLEAR)
ORAL | Status: DC | PRN
  Filled 2011-12-29: qty 125

## 2011-12-29 MED ORDER — METOPROLOL TARTRATE 25 MG PO TABS
25.0000 mg | ORAL_TABLET | Freq: Two times a day (BID) | ORAL | Status: DC
  Administered 2011-12-29 – 2011-12-30 (×4): 25 mg via ORAL
  Filled 2011-12-29 (×6): qty 1

## 2011-12-29 MED ORDER — BOOST / RESOURCE BREEZE PO LIQD
1.0000 | Freq: Three times a day (TID) | ORAL | Status: DC
  Administered 2011-12-29 – 2012-01-03 (×5): 1 via ORAL

## 2011-12-29 NOTE — Progress Notes (Signed)
Orthopedic Tech Progress Note Patient Details:  [name] August 18, 1924 [id number]  Patient ID: [name], female   DOB: [birth year], 78 y.o.   MRN: [id number] Confirmed pt has collar.  Leo Grosser T 12/29/2011, 1:43 PM
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
## 2011-12-29 NOTE — Progress Notes (Signed)
UR complete 

## 2011-12-29 NOTE — Progress Notes (Signed)
Speech Language Pathology Dysphagia Treatment Patient Details Name: [name] MRN: [id number] DOB: [birth year] Today's Date: 12/29/2011 Time: 4098-1191 SLP Time Calculation (min): 32 min  Assessment / Plan / Recommendation Clinical Impression  F/u diet tolerance assessment today following FEES complete 6/26 revealed what appears to be overall worsening swallowing function with a significant increase in wet congested cough from baseline with all po. SLP attemtped to adjust thickness, consistency of solids, and method of administration without changes. Vocal quality extremely wet and cough ineffective to clear. Suspect that fatigue and SOB are playing a large part in inability to consume amount of pos necessary to maintain nutritional status. Discussed with RN who is in agreement with holding majority of pos at this time. Recommend NPO except meds crushed in puree and ice chips after oral care in between medication administration.  (Will f/u at bedside 6/28 to determine readiness for pos)    Diet Recommendation  Initiate / Change Diet: NPO;Other (comment) (except ice chips after oral care and meds crushed in puree)    SLP Plan Goals updated   Pertinent Vitals/Pain n/a   Swallowing Goals  SLP Swallowing Goals Goal #4: Patient will consume clinician provided po trials with no s/s of aspiration and with mod assist for use of compensatory strategies to determine readiness for resuming a full po diet.  Swallow Study Goal #4 - Progress: Not met  General Temperature Spikes Noted: No Respiratory Status: Supplemental O2 delivered via (comment) (2 L nasal cannula) Behavior/Cognition: Cooperative;Pleasant mood;Lethargic Oral Cavity - Dentition: Dentures, bottom;Dentures, top Patient Positioning: Upright in chair  Oral Cavity - Oral Hygiene     Dysphagia Treatment Treatment focused on: Skilled observation of diet tolerance;Patient/family/caregiver education;Utilization of compensatory  strategies Family/Caregiver Educated: sister Treatment Methods/Modalities: Skilled observation;Differential diagnosis;Effortful swallow Patient observed directly with PO's: Yes Type of PO's observed: Dysphagia 3 (soft);Dysphagia 1 (puree);Nectar-thick liquids;Honey-thick liquids Feeding: Total assist Liquids provided via: Teaspoon;Cup Oral Phase Signs & Symptoms: Prolonged oral phase;Prolonged bolus formation;Prolonged mastication Pharyngeal Phase Signs & Symptoms: Suspected delayed swallow initiation;Immediate cough;Delayed cough;Changes in respirations;Watery eyes Type of cueing: Verbal;Tactile Amount of cueing: Maximal   Ferdinand Lango MA, CCC-SLP [phone]      [name] [name] 12/29/2011, 3:30 PM

## 2011-12-29 NOTE — Progress Notes (Signed)
Orthopedic Tech Progress Note Patient Details:  [name] [birth year] [id number]  Ortho Devices Type of Ortho Device: Other (comment) (ankle air cast) Ortho Device/Splint Location: left ankle Ortho Device/Splint Interventions: Freeman Caldron, Shavy Beachem 12/29/2011, 4:09 PM

## 2011-12-29 NOTE — Progress Notes (Signed)
Orthopedic Tech Progress Note Patient Details:  [name] 07/28/1924 [id number]  Patient ID: [name], female   DOB: December 22, 1924, 78 y.o.   MRN: [id number] Doctor ordered ankle air cast splint; stated that he would put order in computer which has not taken place as of yet  [name] 12/29/2011, 4:11 PM

## 2011-12-29 NOTE — Progress Notes (Signed)
Right leg dressing soaking through at lateral mid calf. Exudate has malodorous oder. Dressing placed by surgical team. Reinforced with tape and abd pad to contain exudate. Noted that ortho PA plans to change dressing at end of week.

## 2011-12-29 NOTE — Progress Notes (Signed)
Spent 30 minutes at bedside trying to assist and encourage patient to have eat breakfast.  She stated she like the choices she had for breakfast but after minimal PO intake (a few bites) she refused to eat anything else.  Will continue to encourage PO intake.
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
## 2011-12-29 NOTE — Progress Notes (Signed)
Orthopaedic Trauma [facility] (OTS)  Subjective: 6 Days Post-Op Procedure(s) (LRB): IRRIGATION AND DEBRIDEMENT EXTREMITY (Bilateral) PERCUTANEOUS PINNING EXTREMITY (Right) Sitting upright eating sweet potato pie; pain well controlled  Objective: Current Vitals Blood pressure 159/72, pulse 97, temperature 97.7 F (36.5 C), temperature source Core (Comment), resp. rate 28, height 5\' 6"  (1.676 m), weight 190 lb 11.2 oz (86.5 kg), SpO2 100.00%. Vital signs in last 24 hours: Temp:  [97.7 F (36.5 C)-100.2 F (37.9 C)] 97.7 F (36.5 C) (06/27 1500) Pulse Rate:  [50-163] 97  (06/27 1400) Resp:  [17-28] 28  (06/27 1500) BP: (66-175)/(45-115) 159/72 mmHg (06/27 1500) SpO2:  [96 %-100 %] 100 % (06/27 1541)  Intake/Output from previous day: 06/26 0701 - 06/27 0700 In: 1190 [P.O.:700; I.V.:480; IV Piggyback:10] Out: 1575 [Urine:1575]  LABS  Basename 12/28/11 0500 12/27/11 0430  HGB 9.6* 9.3*    Basename 12/28/11 0500 12/27/11 0430  WBC 19.9* 20.6*  RBC 3.04* 2.91*  HCT 30.1* 28.1*  PLT 196 167    Basename 12/28/11 0500 12/27/11 0430  NA 148* 144  K 3.7 3.6  CL 112 106  CO2 26 25  BUN 84* 98*  CREATININE 2.13* 2.83*  GLUCOSE 142* 142*  CALCIUM 9.0 8.9   No results found for this basename: LABPT:2,INR:2 in the last 72 hours   Physical Exam L hand serous drainage without erythema or purulence; re-dressed, cast fitting well from yesterday L leg large dusky skin flap (15x10cm) without purulence, mild serous drainage from incision R leg no duskiness or breakdown from knee proximally; scant superficial purulence distally, large areas of granulation, large areas of dusky and ecchymotic skin; penrose drains removed Bilateral ankles with active motion and minimal tenderness despite fractures; pin sites clean left foot   Imaging Dg Wrist Complete Left  12/28/2011  *RADIOLOGY REPORT*  Clinical Data: History of fractures.  Follow-up after cast application.  LEFT WRIST - COMPLETE  3+ VIEW  Comparison: 12/27/2011.  Findings: There is a fracture of the distal diaphysis of the ulna. There is transverse fracture of distal metaphysis of radius.  Cast has been applied.  There is slight impaction of fracture sites. There is near anatomic alignment.  There is slight dorsal displacement of the distal radial fracture fragment.  IMPRESSION: Fractures of distal radius and ulna.  Post cast application.  Original Report Authenticated By: Crawford Givens, M.D.   Dg Ankle Left Port  12/29/2011  *RADIOLOGY REPORT*  Clinical Data: Fracture, followup.  PORTABLE LEFT ANKLE - 2 VIEW  Comparison: 12/21/2011  Findings: In brace views of the left ankle are submitted.  It is difficult to visualize the medial malleolar fracture as seen on prior study.  Anatomic alignment.  IMPRESSION: Anatomic alignment.  Original Report Authenticated By: Cyndie Chime, M.D.   Dg Ankle Right Port  12/29/2011  *RADIOLOGY REPORT*  Clinical Data: Follow-up fracture.  PORTABLE RIGHT ANKLE - 2 VIEW  Comparison: None.  Findings: In cast views of the right ankle demonstrate near anatomic alignment of the distal fibular fracture.  Partial visualization of pins within the midfoot in the region of the first metatarsal fracture.  IMPRESSION: Near anatomic alignment of the distal fibular fracture.  Original Report Authenticated By: Cyndie Chime, M.D.    Assessment/Plan: 6 Days Post-Op Procedure(s) (LRB): IRRIGATION AND DEBRIDEMENT EXTREMITY (Bilateral) PERCUTANEOUS PINNING EXTREMITY (Right) L med mall frx R lat mall frx L wrist frx  1. Plastics wound consult for possible A-cell therapy; wounds redressed today and will change again on Monday, leaving mepitel  in place barring new plan from Dr. Kelly Splinter; we did eval with Lazaro Arms, PAC jointly today 2. bilat aircasts for ankles; may change to multipodas boot on the left as patient wishes for additional support of foot 3. Do not anticipate a return to the OR at this point unless  for eventual skin grafting  Myrene Galas, MD [facility], [facility] [phone] 12/29/2011, 4:40 PM

## 2011-12-29 NOTE — Progress Notes (Signed)
Attempted to reach family to provide SNF bed offers rec'd thus far but unsuccessful- left list of offers at bedside- will ask CSW covering tomorrow to f/u for bed selection and additional offers- Reece Levy, MSW, [employer] [phone]

## 2011-12-30 LAB — BASIC METABOLIC PANEL
BUN: 55 mg/dL — ABNORMAL HIGH (ref 6–23)
CO2: 28 mEq/L (ref 19–32)
Calcium: 8.9 mg/dL (ref 8.4–10.5)
Creatinine, Ser: 1.43 mg/dL — ABNORMAL HIGH (ref 0.50–1.10)
GFR calc Af Amer: 42 mL/min — ABNORMAL LOW (ref >90)
GFR calc non Af Amer: 37 mL/min — ABNORMAL LOW (ref >90)
GFR calc non Af Amer: 33 mL/min — ABNORMAL LOW (ref >90)
Glucose, Bld: 109 mg/dL — ABNORMAL HIGH (ref 70–99)
Potassium: 3.3 mEq/L — ABNORMAL LOW (ref 3.5–5.1)
Sodium: 160 mEq/L — ABNORMAL HIGH (ref 135–145)
Sodium: 159 mEq/L — ABNORMAL HIGH (ref 135–145)

## 2011-12-30 LAB — GLUCOSE, CAPILLARY
Glucose-Capillary: 101 mg/dL — ABNORMAL HIGH (ref 70–99)
Glucose-Capillary: 127 mg/dL — ABNORMAL HIGH (ref 70–99)

## 2011-12-30 LAB — CK: Total CK: 30 U/L (ref 7–177)

## 2011-12-30 MED ORDER — MOXIFLOXACIN HCL IN NACL 400 MG/250ML IV SOLN
400.0000 mg | INTRAVENOUS | Status: DC
  Administered 2011-12-30 – 2012-01-02 (×4): 400 mg via INTRAVENOUS
  Filled 2011-12-30 (×5): qty 250

## 2011-12-30 MED ORDER — BIOTENE DRY MOUTH MT LIQD
15.0000 mL | Freq: Two times a day (BID) | OROMUCOSAL | Status: DC
  Administered 2011-12-30 – 2012-01-03 (×7): 15 mL via OROMUCOSAL

## 2011-12-30 MED ORDER — DEXTROSE 5 % IV SOLN
INTRAVENOUS | Status: DC
  Administered 2011-12-30 – 2012-01-02 (×5): via INTRAVENOUS
  Administered 2012-01-03: 100 mL via INTRAVENOUS

## 2011-12-30 MED ORDER — ENOXAPARIN SODIUM 30 MG/0.3ML ~~LOC~~ SOLN
30.0000 mg | SUBCUTANEOUS | Status: DC
  Administered 2011-12-30 – 2012-01-03 (×5): 30 mg via SUBCUTANEOUS
  Filled 2011-12-30 (×5): qty 0.3

## 2011-12-30 MED ORDER — HALOPERIDOL LACTATE 5 MG/ML IJ SOLN
5.0000 mg | Freq: Four times a day (QID) | INTRAMUSCULAR | Status: DC | PRN
  Filled 2011-12-30: qty 1

## 2011-12-30 MED ORDER — FUROSEMIDE 10 MG/ML IJ SOLN
20.0000 mg | Freq: Once | INTRAMUSCULAR | Status: AC
  Administered 2011-12-30: 20 mg via INTRAVENOUS
  Filled 2011-12-30: qty 2

## 2011-12-30 MED ORDER — GLYCOPYRROLATE 0.2 MG/ML IJ SOLN
0.1000 mg | INTRAMUSCULAR | Status: DC | PRN
  Administered 2011-12-30 – 2011-12-31 (×3): 0.1 mg via INTRAVENOUS
  Filled 2011-12-30 (×3): qty 0.5

## 2011-12-30 MED ORDER — CHLORHEXIDINE GLUCONATE 0.12 % MT SOLN
15.0000 mL | Freq: Two times a day (BID) | OROMUCOSAL | Status: DC
  Administered 2011-12-30 – 2012-01-03 (×7): 15 mL via OROMUCOSAL
  Filled 2011-12-30 (×11): qty 15

## 2011-12-30 MED ORDER — BACITRACIN-NEOMYCIN-POLYMYXIN OINTMENT TUBE
TOPICAL_OINTMENT | Freq: Every day | CUTANEOUS | Status: DC
  Administered 2011-12-30: 08:00:00 via TOPICAL
  Administered 2011-12-31 – 2012-01-01 (×3): 1 via TOPICAL
  Administered 2012-01-02 – 2012-01-03 (×2): via TOPICAL
  Filled 2011-12-30 (×3): qty 45
  Filled 2011-12-30: qty 15
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
## 2011-12-30 NOTE — Clinical Documentation Improvement (Signed)
MALNUTRITION DOCUMENTATION CLARIFICATION  THIS DOCUMENT IS NOT A PERMANENT PART OF THE MEDICAL RECORD  TO RESPOND TO THE THIS QUERY, FOLLOW THE INSTRUCTIONS BELOW:  1. If needed, update documentation for the patient's encounter via the notes activity.  2. Access this query again and click edit on the In [employer].  3. After updating, or not, click F2 to complete all highlighted (required) fields concerning your review. Select "additional documentation in the medical record" OR "no additional documentation provided".  4. Click Sign note button.  5. The deficiency will fall out of your In Basket *Please let us know if you are not able to complete this workflow by phone or e-mail (listed below).  Please update your documentation within the medical record to reflect your response to this query.                                                                                        12/30/11   Dear Dr.Summer Parthasarathy / Associates,  In a better effort to capture your patient's severity of illness, reflect appropriate length of stay and utilization of resources, a review of the patient medical record has revealed the following indicators.    Based on your clinical judgment, please clarify and document in a progress note and/or discharge summary the clinical condition associated with the following supporting information:  In responding to this query please exercise your independent judgment.  The fact that a query is asked, does not imply that any particular answer is desired or expected.   Possible Clinical Conditions?  _______Moderate Malnutrition _______Severe Malnutrition   _______Protein Calorie Malnutrition _______Severe Protein Calorie Malnutrition _______Other Condition _______Cannot clinically determine     Supporting Information: Risk Factors: Per 6/27 follow-up from RD: Inadequate oral intake related to poor appetite, eating only small bites of meals. Patient's dysphagia is  likely an acute reversible dysphagia and should progress quickly.  Signs & Symptoms: Ht: 5'6"     Wt: 73.6kg  BMI: 23,6  Treatment : Resource Breeze po TID(thickened to nectar consistency).  Nutrition Consult: follow-up on 12/29/11  You may use possible, probable, or suspect with inpatient documentation. possible, probable, suspected diagnoses MUST be documented at the time of discharge  Reviewed:  no additional documentation provided  Thank You,  Marciano Sequin,  Clinical Documentation Specialist:  Pager: [phone]  Health Information Management [place]

## 2011-12-30 NOTE — Progress Notes (Addendum)
Pt having Tmax of 101.4*F. Pt has PO tylenol but has been aspirating and has not managed/clear PO meds earlier in the evening. Dr Lindie Spruce notified. No new orders received. Will continue to monitor and encourage TCDB

## 2011-12-30 NOTE — Progress Notes (Signed)
Patient ID: [name], female   DOB: 06-Aug-1924, 78 y.o.   MRN: [id number] I spoke to the patient's sister at the bedside and to her daughter by phone.  I updated them on the patient's worsening MS and respiratory failure.  Her daughter re-afirmed DNR, DNI, and no feeding tube.  She asks [place] to focus on comfort measures.  I assured her we will respect this.  I added Haldol to help with delirium and robinul for secretion control. [name], MD, MPH, FACS Pager: [phone]

## 2011-12-30 NOTE — Progress Notes (Signed)
Speech Language Pathology Dysphagia Treatment Patient Details Name: [name] MRN: [id number] DOB: 07/10/1924 Today's Date: 12/30/2011 Time: 1191-4782 SLP Time Calculation (min): 20 min  Assessment / Plan / Recommendation Clinical Impression  Treatment focused on re-assessment of swallowing function to determine readiness to resume a po diet or participate in objective testing. Overall condition appearing worse today with patient being largely aphonic, poor breath support for speech, and very wet vocal quality during limited attempts at vocalization. SLP provided max cues for throat clearing, coughing, and dry swallow, none of which were effective to assist in pharyngeal clearance of suspected secretions on vocal cords. Oral care provided. Patient provided with one-two ice chips given inability to initiate a dry swallow. Patient with increased oral transit time, sluggish laryngeal elevation and an increase in both wet vocal quality and cough. No further pos provided due to seemingly high aspiration risk. Ultimately, recommend full NPO including meds with thorough oral care to decrease aspiration risk with secretions. ? if family would consider temporary non-oral means of nutrition. Hopeful for improved swallowing function and ability to resume a po diet as respiratory status improves. SLP will continue to f/u.     Diet Recommendation  Continue with Current Diet: NPO (including meds)    SLP Plan Continue with current plan of care   Pertinent Vitals/Pain n/a   Swallowing Goals  SLP Swallowing Goals Goal #3: Patient will demonstrate ability to manage secretions at bedside with min clinician cues to determine readiness for further po trials.  Swallow Study Goal #3 - Progress: Progressing toward goal  General Temperature Spikes Noted: Yes Respiratory Status: Supplemental O2 delivered via (comment) Behavior/Cognition: Lethargic;Requires cueing;Distractible;Decreased sustained  attention;Doesn't follow directions;Cooperative Oral Cavity - Dentition: Dentures, bottom;Dentures, top Patient Positioning: Upright in chair    Dysphagia Treatment Treatment focused on: Upgraded PO texture trials;Facilitation of pharyngeal phase;Utilization of compensatory strategies Treatment Methods/Modalities: Skilled observation;Differential diagnosis;Effortful swallow Patient observed directly with PO's: Yes Type of PO's observed: Ice chips Feeding: Total assist Liquids provided via: Teaspoon Oral Phase Signs & Symptoms: Prolonged oral phase;Prolonged bolus formation;Prolonged mastication Pharyngeal Phase Signs & Symptoms: Suspected delayed swallow initiation;Wet vocal quality;Delayed cough (sluggish laryngeal elevation) Type of cueing: Verbal;Tactile Amount of cueing: Maximal   GO   Ferdinand Lango MA, CCC-SLP [phone]   Ferdinand Lango Meryl 12/30/2011, 12:33 PM

## 2011-12-31 ENCOUNTER — Inpatient Hospital Stay (HOSPITAL_COMMUNITY): Payer: Medicare Other

## 2011-12-31 LAB — GLUCOSE, CAPILLARY: Glucose-Capillary: 120 mg/dL — ABNORMAL HIGH (ref 70–99)

## 2011-12-31 LAB — BASIC METABOLIC PANEL
BUN: 48 mg/dL — ABNORMAL HIGH (ref 6–23)
BUN: 50 mg/dL — ABNORMAL HIGH (ref 6–23)
Creatinine, Ser: 1.33 mg/dL — ABNORMAL HIGH (ref 0.50–1.10)
Creatinine, Ser: 1.35 mg/dL — ABNORMAL HIGH (ref 0.50–1.10)
GFR calc Af Amer: 41 mL/min — ABNORMAL LOW (ref >90)
GFR calc non Af Amer: 35 mL/min — ABNORMAL LOW (ref >90)
GFR calc non Af Amer: 36 mL/min — ABNORMAL LOW (ref >90)
Glucose, Bld: 148 mg/dL — ABNORMAL HIGH (ref 70–99)
Potassium: 3.1 mEq/L — ABNORMAL LOW (ref 3.5–5.1)

## 2011-12-31 LAB — CBC
Hemoglobin: 9.7 g/dL — ABNORMAL LOW (ref 12.0–15.0)
MCH: 31.8 pg (ref 26.0–34.0)
MCHC: 30 g/dL (ref 30.0–36.0)
MCHC: 30.7 g/dL (ref 30.0–36.0)
RDW: 21 % — ABNORMAL HIGH (ref 11.5–15.5)
RDW: 21.1 % — ABNORMAL HIGH (ref 11.5–15.5)

## 2011-12-31 MED ORDER — METOPROLOL TARTRATE 1 MG/ML IV SOLN
10.0000 mg | Freq: Four times a day (QID) | INTRAVENOUS | Status: DC
Start: 2011-12-31 — End: 2012-01-03
  Administered 2011-12-31 – 2012-01-03 (×12): 10 mg via INTRAVENOUS
  Filled 2011-12-31 (×3): qty 10
  Filled 2011-12-31: qty 5
  Filled 2011-12-31: qty 10
  Filled 2011-12-31: qty 5
  Filled 2011-12-31 (×12): qty 10

## 2011-12-31 MED ORDER — POTASSIUM CHLORIDE 10 MEQ/100ML IV SOLN
10.0000 meq | INTRAVENOUS | Status: AC
  Administered 2011-12-31: 10 meq via INTRAVENOUS
  Filled 2011-12-31 (×2): qty 100

## 2011-12-31 MED ORDER — POTASSIUM CHLORIDE 10 MEQ/50ML IV SOLN
INTRAVENOUS | Status: AC
  Administered 2011-12-31: 10 meq
  Filled 2011-12-31: qty 50
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
## 2011-12-31 NOTE — Progress Notes (Signed)
Daily wound care and dressing change completed utilizing non adherent silicon mesh "Mepitel" 10x18cm dressing (ordered fm SPD) on wound bed bilaterally covered liberally with with antibiotic ointment (ordered via MAR fm pharmacy) wrapped with Kerlix, reinforced with ABD pads directly over wounds and wrapped by ACE wraps.   Saline on gloves keeps Mepitel from sticking to fingers.

## 2012-01-01 LAB — GLUCOSE, CAPILLARY
Glucose-Capillary: 113 mg/dL — ABNORMAL HIGH (ref 70–99)
Glucose-Capillary: 115 mg/dL — ABNORMAL HIGH (ref 70–99)
Glucose-Capillary: 122 mg/dL — ABNORMAL HIGH (ref 70–99)
Glucose-Capillary: 143 mg/dL — ABNORMAL HIGH (ref 70–99)

## 2012-01-01 MED ORDER — SODIUM CHLORIDE 0.9 % IJ SOLN
10.0000 mL | INTRAMUSCULAR | Status: DC | PRN
  Administered 2012-01-01 (×2): 40 mL
  Administered 2012-01-01: 20 mL
  Administered 2012-01-02 (×5): 10 mL
  Administered 2012-01-03: 20 mL

## 2012-01-01 MED ORDER — BISACODYL 10 MG RE SUPP
10.0000 mg | Freq: Every day | RECTAL | Status: DC
Start: 2012-01-01 — End: 2012-01-03
  Administered 2012-01-01 – 2012-01-03 (×3): 10 mg via RECTAL
  Filled 2012-01-01 (×4): qty 1

## 2012-01-01 NOTE — Progress Notes (Signed)
Spoke with Dr. Janee Morn about pt still having foley catheter.  Dr. Janee Morn gave verbal order to take out foley catheter.  Will carry out MD orders and continue to monitor.
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
## 2012-01-01 NOTE — Clinical Social Work Note (Signed)
CSW met with pt's daughter and provided bed offers for SNF placement. Pt's daughter shared that her main concern is to keep her mother comfortable. Pt's daughter questioned pt's rehab potential and what would meet pt's needs as there is a concern of Medicare coverage. Pt's daughter will be visiting facilities to make a choice of SNF. CSW provided supportive counseling around discharge planning. Weekday CSW will continue to follow to facilitate discharge plan.   Dede Query, MSW, Theresia Majors [phone]

## 2012-01-02 ENCOUNTER — Inpatient Hospital Stay (HOSPITAL_COMMUNITY): Payer: Medicare Other

## 2012-01-02 LAB — GLUCOSE, CAPILLARY
Glucose-Capillary: 110 mg/dL — ABNORMAL HIGH (ref 70–99)
Glucose-Capillary: 110 mg/dL — ABNORMAL HIGH (ref 70–99)
Glucose-Capillary: 119 mg/dL — ABNORMAL HIGH (ref 70–99)
Glucose-Capillary: 127 mg/dL — ABNORMAL HIGH (ref 70–99)

## 2012-01-02 LAB — CULTURE, RESPIRATORY W GRAM STAIN

## 2012-01-02 MED ORDER — ENSURE PUDDING PO PUDG
1.0000 | Freq: Three times a day (TID) | ORAL | Status: DC
  Administered 2012-01-03 (×2): 1 via ORAL

## 2012-01-02 NOTE — Clinical Social Work Note (Signed)
Clinical Social Worker met with patient daughter in 44 waiting area while patient was down for swallow evaluation.  Patient daughter states that patient mental status is improving but that it continues to be intermittent.  Patient daughter agreeable with SNF placement in [facility] and plans to go visit [address] and [place] prior to making a decision.  Per MD, patient passed swallow evaluation and is now on a puree diet and potentially ready for discharge Tuesday 01/03/2012.  Patient daughter understanding of patient discharge plans and prefers patient to be transported by [facility] upon discharge.  Clinical Social Worker will continue to follow up with patient and patient daughter to offer emotional support and facilitate patient discharge needs once medically ready.  [address] [place], [place] [phone]

## 2012-01-02 NOTE — Progress Notes (Signed)
Speech Language Pathology Dysphagia Treatment Patient Details Name: [name] MRN: [id number] DOB: 07-15-1924 Today's Date: 01/02/2012 Time: 4098-1191 SLP Time Calculation (min): 14 min  Assessment / Plan / Recommendation Clinical Impression  Treatment focused on re-assessment of swallowing function. Patient more alert, able to sustain attention longer today than during previous session, vocal quality improving however remains moderately hoarse and with moderately decreased breath support during phonation. SLP provided po trials to re-assess function. Patient with immediate cough with ice chips, suspect delayed swallow initiation, and with delayed cough following multiple bites of puree, ? baseline cough vs due to aspiration. Regardless, overall function much improved with ability to maintain baseline vocal quality during po trials and carryout compensatory strategies including multiple swallows with moderate SLP cues. Given multiple risk factors for aspiration and previous poor tolerance of diet, recommend objective evaluation prior to resuming a po diet.. Will proceed with MBS this pm.     Diet Recommendation  Continue with Current Diet: NPO    SLP Plan New goals to be determined pending objective testing   Pertinent Vitals/Pain n/a     General Temperature Spikes Noted: No Respiratory Status: Supplemental O2 delivered via (comment) (2 L nasal cannula) Behavior/Cognition: Alert;Cooperative;Distractible;Requires cueing;Decreased sustained attention Oral Cavity - Dentition: Dentures, bottom;Dentures, top Patient Positioning: Upright in bed   Dysphagia Treatment Treatment focused on: Upgraded PO texture trials;Facilitation of pharyngeal phase;Utilization of compensatory strategies Treatment Methods/Modalities: Skilled observation;Differential diagnosis Patient observed directly with PO's: Yes Type of PO's observed: Dysphagia 1 (puree);Ice chips Feeding: Total assist Liquids  provided via: Teaspoon Pharyngeal Phase Signs & Symptoms: Immediate cough;Delayed cough Type of cueing: Verbal;Tactile Amount of cueing: Moderate   GO   [name] Lango MA, CCC-SLP [phone]   [name] 01/02/2012, 11:40 AM
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
## 2012-01-02 NOTE — Progress Notes (Signed)
0900 seen and evaluated by Dr. Laurell Josephs . Murphy cervicall  Collar by Dr. Laurell Josephs .Marland Kitchen No pain . Pt abl;e to turn neck side to side w/o difficulty
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
## 2012-01-02 NOTE — Progress Notes (Signed)
Nutrition Follow-up  Intervention:   1. Ensure pudding TID -use to administer medications as able each supplement provides 170 kcal and 4 grams of protein.   2.Continue with Resource Breeze thickened, po TID, each supplement provides 250 kcal and 9 grams of protein.   3. RD will continue to follow    Assessment:   Pt had follow up bedside swallow eval, recommended NPO until MBS completed. MBS completed today recommends D1 diet with pudding liquids. Pt with poor intake of diet prior to MBS, mostly 0%  Notes indicate family does not want a feeding tube. Pt is not meeting nutrition needs.     Diet Order:  Dysphagia 1, pudding thick liquids Supplements: Resource Breeze TID, thickend  Meds: Scheduled Meds:   . antiseptic oral rinse  15 mL Mouth Rinse q12n4p  . bisacodyl  10 mg Rectal Daily  . chlorhexidine  15 mL Mouth Rinse BID  . enoxaparin (LOVENOX) injection  30 mg Subcutaneous Q24H  . feeding supplement  1 Container Oral TID BM  . insulin aspart  0-9 Units Subcutaneous Q4H  . levalbuterol  0.63 mg Nebulization Q6H  . levothyroxine  50 mcg Intravenous Daily  . metoprolol  10 mg Intravenous Q6H  . moxifloxacin  400 mg Intravenous Q24H  . neomycin-bacitracin-polymyxin   Topical Daily  . pantoprazole (PROTONIX) IV  40 mg Intravenous Daily   Continuous Infusions:   . dextrose 100 mL/hr at 01/02/12 0501   PRN Meds:.acetaminophen (TYLENOL) oral liquid 160 mg/5 mL, food thickener, glycopyrrolate, haloperidol lactate, HYDROmorphone (DILAUDID) injection, levalbuterol, ondansetron (ZOFRAN) IV, ondansetron, sodium chloride  Labs:  CMP     Component Value Date/Time   NA 157* 12/31/2011 0500   K 3.1* 12/31/2011 0500   CL 120* 12/31/2011 0500   CO2 28 12/31/2011 0500   GLUCOSE 148* 12/31/2011 0500   BUN 48* 12/31/2011 0500   CREATININE 1.33* 12/31/2011 0500   CALCIUM 8.8 12/31/2011 0500   PROT 5.1* 12/27/2011 0430   ALBUMIN 2.1* 12/27/2011 0430   AST 28 12/27/2011 0430   ALT <5 12/27/2011  0430   ALKPHOS 80 12/27/2011 0430   BILITOT 1.2 12/27/2011 0430   GFRNONAA 36* 12/31/2011 0500   GFRAA 41* 12/31/2011 0500     Intake/Output Summary (Last 24 hours) at 01/02/12 1456 Last data filed at 01/02/12 1300  Gross per 24 hour  Intake     20 ml  Output   1526 ml  Net  -1506 ml    Weight Status:  138 lbs, pt went from 174 lbs to 137 lbs in the same day (6/30) and is not 138 lbs. ? Accuracy of weight. Weight trending down overall.   Re-estimated needs:  1600-1800 kcal, 105-120 gm protein  Nutrition Dx:  Inadequate oral intake r/t poor appetite AEB 0% intake per documentation  Goal:  Meet 90-100% nutrition needs, unmet  Monitor:  PO intake, weight, labs   Clarene Duke RD, LDN Pager [phone] After Hours pager [phone]

## 2012-01-03 ENCOUNTER — Emergency Department (HOSPITAL_COMMUNITY): Payer: Medicare Other

## 2012-01-03 ENCOUNTER — Inpatient Hospital Stay (HOSPITAL_COMMUNITY)
Admission: EM | Admit: 2012-01-03 | Discharge: 2012-01-10 | DRG: 175 | Disposition: A | Discharge status: Skilled Nursing Facility | Payer: Medicare Other | Source: Ambulatory Visit | Attending: Internal Medicine | Admitting: Internal Medicine

## 2012-01-03 DIAGNOSIS — J9819 Other pulmonary collapse: Secondary | ICD-10-CM | POA: Diagnosis not present

## 2012-01-03 DIAGNOSIS — I509 Heart failure, unspecified: Secondary | ICD-10-CM

## 2012-01-03 DIAGNOSIS — D62 Acute posthemorrhagic anemia: Secondary | ICD-10-CM | POA: Diagnosis present

## 2012-01-03 DIAGNOSIS — R0902 Hypoxemia: Secondary | ICD-10-CM | POA: Diagnosis present

## 2012-01-03 DIAGNOSIS — I2699 Other pulmonary embolism without acute cor pulmonale: Secondary | ICD-10-CM | POA: Diagnosis not present

## 2012-01-03 DIAGNOSIS — J9610 Chronic respiratory failure, unspecified whether with hypoxia or hypercapnia: Secondary | ICD-10-CM | POA: Diagnosis not present

## 2012-01-03 DIAGNOSIS — E870 Hyperosmolality and hypernatremia: Secondary | ICD-10-CM | POA: Diagnosis present

## 2012-01-03 DIAGNOSIS — Z515 Encounter for palliative care: Secondary | ICD-10-CM

## 2012-01-03 DIAGNOSIS — S27329A Contusion of lung, unspecified, initial encounter: Secondary | ICD-10-CM | POA: Diagnosis present

## 2012-01-03 DIAGNOSIS — Z87828 Personal history of other (healed) physical injury and trauma: Secondary | ICD-10-CM

## 2012-01-03 DIAGNOSIS — G890 Central pain syndrome: Secondary | ICD-10-CM | POA: Diagnosis not present

## 2012-01-03 DIAGNOSIS — E46 Unspecified protein-calorie malnutrition: Secondary | ICD-10-CM

## 2012-01-03 DIAGNOSIS — G459 Transient cerebral ischemic attack, unspecified: Secondary | ICD-10-CM | POA: Diagnosis not present

## 2012-01-03 DIAGNOSIS — Z96659 Presence of unspecified artificial knee joint: Secondary | ICD-10-CM

## 2012-01-03 DIAGNOSIS — J90 Pleural effusion, not elsewhere classified: Secondary | ICD-10-CM | POA: Diagnosis not present

## 2012-01-03 DIAGNOSIS — Z8261 Family history of arthritis: Secondary | ICD-10-CM

## 2012-01-03 DIAGNOSIS — I519 Heart disease, unspecified: Secondary | ICD-10-CM | POA: Diagnosis present

## 2012-01-03 DIAGNOSIS — J9600 Acute respiratory failure, unspecified whether with hypoxia or hypercapnia: Secondary | ICD-10-CM | POA: Diagnosis not present

## 2012-01-03 DIAGNOSIS — D72829 Elevated white blood cell count, unspecified: Secondary | ICD-10-CM | POA: Diagnosis present

## 2012-01-03 DIAGNOSIS — E559 Vitamin D deficiency, unspecified: Secondary | ICD-10-CM | POA: Diagnosis not present

## 2012-01-03 DIAGNOSIS — I5033 Acute on chronic diastolic (congestive) heart failure: Secondary | ICD-10-CM | POA: Diagnosis present

## 2012-01-03 DIAGNOSIS — E039 Hypothyroidism, unspecified: Secondary | ICD-10-CM | POA: Diagnosis present

## 2012-01-03 DIAGNOSIS — S42033A Displaced fracture of lateral end of unspecified clavicle, initial encounter for closed fracture: Secondary | ICD-10-CM

## 2012-01-03 DIAGNOSIS — R0600 Dyspnea, unspecified: Secondary | ICD-10-CM

## 2012-01-03 DIAGNOSIS — K219 Gastro-esophageal reflux disease without esophagitis: Secondary | ICD-10-CM | POA: Diagnosis present

## 2012-01-03 DIAGNOSIS — I10 Essential (primary) hypertension: Secondary | ICD-10-CM | POA: Diagnosis not present

## 2012-01-03 DIAGNOSIS — I517 Cardiomegaly: Secondary | ICD-10-CM | POA: Diagnosis not present

## 2012-01-03 DIAGNOSIS — S8261XA Displaced fracture of lateral malleolus of right fibula, initial encounter for closed fracture: Secondary | ICD-10-CM | POA: Diagnosis present

## 2012-01-03 DIAGNOSIS — Z8249 Family history of ischemic heart disease and other diseases of the circulatory system: Secondary | ICD-10-CM

## 2012-01-03 DIAGNOSIS — I309 Acute pericarditis, unspecified: Secondary | ICD-10-CM | POA: Diagnosis present

## 2012-01-03 DIAGNOSIS — IMO0001 Reserved for inherently not codable concepts without codable children: Secondary | ICD-10-CM

## 2012-01-03 DIAGNOSIS — R1310 Dysphagia, unspecified: Secondary | ICD-10-CM

## 2012-01-03 DIAGNOSIS — Z66 Do not resuscitate: Secondary | ICD-10-CM | POA: Diagnosis present

## 2012-01-03 DIAGNOSIS — F411 Generalized anxiety disorder: Secondary | ICD-10-CM | POA: Diagnosis present

## 2012-01-03 DIAGNOSIS — S92902A Unspecified fracture of left foot, initial encounter for closed fracture: Secondary | ICD-10-CM | POA: Diagnosis present

## 2012-01-03 DIAGNOSIS — S5290XD Unspecified fracture of unspecified forearm, subsequent encounter for closed fracture with routine healing: Secondary | ICD-10-CM

## 2012-01-03 DIAGNOSIS — I2789 Other specified pulmonary heart diseases: Secondary | ICD-10-CM | POA: Diagnosis present

## 2012-01-03 DIAGNOSIS — I739 Peripheral vascular disease, unspecified: Secondary | ICD-10-CM | POA: Diagnosis present

## 2012-01-03 DIAGNOSIS — Z888 Allergy status to other drugs, medicaments and biological substances status: Secondary | ICD-10-CM

## 2012-01-03 DIAGNOSIS — E876 Hypokalemia: Secondary | ICD-10-CM | POA: Diagnosis not present

## 2012-01-03 DIAGNOSIS — S8252XA Displaced fracture of medial malleolus of left tibia, initial encounter for closed fracture: Secondary | ICD-10-CM | POA: Diagnosis present

## 2012-01-03 DIAGNOSIS — Z79899 Other long term (current) drug therapy: Secondary | ICD-10-CM

## 2012-01-03 DIAGNOSIS — J9601 Acute respiratory failure with hypoxia: Secondary | ICD-10-CM | POA: Diagnosis present

## 2012-01-03 DIAGNOSIS — S52502A Unspecified fracture of the lower end of left radius, initial encounter for closed fracture: Secondary | ICD-10-CM | POA: Diagnosis present

## 2012-01-03 HISTORY — DX: Personal history of other medical treatment: Z92.89

## 2012-01-03 LAB — POCT I-STAT, CHEM 8
Creatinine, Ser: 1.1 mg/dL (ref 0.50–1.10)
Glucose, Bld: 129 mg/dL — ABNORMAL HIGH (ref 70–99)
Hemoglobin: 11.2 g/dL — ABNORMAL LOW (ref 12.0–15.0)
Sodium: 145 mEq/L (ref 135–145)
TCO2: 26 mmol/L (ref 0–100)

## 2012-01-03 LAB — AST: AST: 28 U/L (ref 0–37)

## 2012-01-03 LAB — C-REACTIVE PROTEIN: CRP: 8.1 mg/dL — ABNORMAL HIGH (ref <0.60)

## 2012-01-03 LAB — CBC WITH DIFFERENTIAL/PLATELET
Basophils Relative: 0 % (ref 0–1)
Eosinophils Absolute: 0.2 10*3/uL (ref 0.0–0.7)
Hemoglobin: 10.5 g/dL — ABNORMAL LOW (ref 12.0–15.0)
MCH: 31.8 pg (ref 26.0–34.0)
MCHC: 31.7 g/dL (ref 30.0–36.0)
Monocytes Relative: 11 % (ref 3–12)
Neutrophils Relative %: 80 % — ABNORMAL HIGH (ref 43–77)

## 2012-01-03 LAB — GLUCOSE, CAPILLARY
Glucose-Capillary: 101 mg/dL — ABNORMAL HIGH (ref 70–99)
Glucose-Capillary: 109 mg/dL — ABNORMAL HIGH (ref 70–99)
Glucose-Capillary: 71 mg/dL (ref 70–99)

## 2012-01-03 LAB — CK: Total CK: 47 U/L (ref 7–177)

## 2012-01-03 MED ORDER — POTASSIUM CHLORIDE 10 MEQ/100ML IV SOLN
10.0000 meq | Freq: Once | INTRAVENOUS | Status: AC
  Administered 2012-01-03: 10 meq via INTRAVENOUS
  Filled 2012-01-03: qty 100

## 2012-01-03 MED ORDER — HYDROCODONE-ACETAMINOPHEN 7.5-750 MG PO TABS: 1.0000 | ORAL_TABLET | Freq: Two times a day (BID) | ORAL | Status: DC | PRN

## 2012-01-03 MED ORDER — BACITRACIN-NEOMYCIN-POLYMYXIN OINTMENT TUBE: 1.0000 "application " | TOPICAL_OINTMENT | Freq: Every day | CUTANEOUS | Status: DC

## 2012-01-03 MED ORDER — METOPROLOL TARTRATE 25 MG PO TABS: 25.0000 mg | ORAL_TABLET | Freq: Two times a day (BID) | ORAL | Status: DC

## 2012-01-03 MED ORDER — METOPROLOL TARTRATE 25 MG PO TABS
25.0000 mg | ORAL_TABLET | Freq: Two times a day (BID) | ORAL | Status: DC
  Administered 2012-01-03: 25 mg via ORAL
  Filled 2012-01-03 (×2): qty 1

## 2012-01-03 NOTE — Progress Notes (Signed)
1315 report given to Ricke Hey , nurse from [facility]  Transported to [place]  Living by [place] [facility] Transport pt  On 2Lmnc . Pt no distressed Jasmine December ,daughter made aware of transport

## 2012-01-03 NOTE — Discharge Instructions (Signed)
No weight bearing on bilateral lower extremities.

## 2012-01-03 NOTE — Progress Notes (Signed)
Physical Therapy Treatment Patient Details Name: [name] MRN: [id number] DOB: December 14, 1924 Today's Date: 01/03/2012 Time: 0865-7846 PT Time Calculation (min): 21 min  PT Assessment / Plan / Recommendation Comments on Treatment Session       Follow Up Recommendations  Skilled nursing facility    Barriers to Discharge        Equipment Recommendations  Defer to next venue    Recommendations for Other Services    Frequency Min 3X/week   Plan Discharge plan remains appropriate;Frequency remains appropriate    Precautions / Restrictions Precautions Precautions: Fall Restrictions Weight Bearing Restrictions: Yes LUE Weight Bearing: Non weight bearing RLE Weight Bearing: Non weight bearing LLE Weight Bearing: Non weight bearing   Pertinent Vitals/Pain Pt c/o bilateral LE pain 5/10. RN notified.     Mobility  Bed Mobility Bed Mobility: Rolling Right;Supine to Sit;Sit to Supine Rolling Right: 2: Max assist Rolling Left: 3: Mod assist Left Sidelying to Sit: HOB flat;2: Max assist Left Sidelying to Sit: Patient Percentage: 20% Supine to Sit: 2: Max assist Sitting - Scoot to Edge of Bed: 1: +1 Total assist Sit to Supine: 1: +1 Total assist Sit to Supine: Patient Percentage: 10% Scooting to [facility]: Not tested (comment) Details for Bed Mobility Assistance: Pt able to move bilateral LEs (abduct/adduct) with cueing.  Assist to raise bilateral LEs.  Max assist to raise shoulders from bed and to manage bilateral LE to return to supine.  Transfers Transfers: Not assessed Ambulation/Gait Ambulation/Gait Assistance: Not tested (comment)    Exercises Total Joint Exercises Hip ABduction/ADduction: 10 reps;AAROM;Both;Supine   PT Diagnosis:    PT Problem List:   PT Treatment Interventions:     PT Goals Acute Rehab PT Goals PT Goal Formulation: With patient/family Time For Goal Achievement: 01/10/12 Potential to Achieve Goals: Fair Pt will Roll Supine to Right Side: with mod  assist PT Goal: Rolling Supine to Right Side - Progress: Progressing toward goal Pt will Roll Supine to Left Side: with mod assist PT Goal: Rolling Supine to Left Side - Progress: Progressing toward goal Pt will go Supine/Side to Sit: with max assist PT Goal: Supine/Side to Sit - Progress: Progressing toward goal Pt will Sit at Edge of Bed: with min assist;1-2 min PT Goal: Sit at Edge Of Bed - Progress: Progressing toward goal Pt will Perform Home Exercise Program: with supervision, verbal cues required/provided PT Goal: Perform Home Exercise Program - Progress: Progressing toward goal  Visit Information  Last PT Received On: 01/03/12    Subjective Data  Subjective: I am ready    Cognition  Overall Cognitive Status: Impaired Area of Impairment: Memory Arousal/Alertness: Awake/alert Orientation Level: Disoriented to;Time Behavior During Session: Anxious Current Attention Level: Focused Memory: Decreased recall of precautions Memory Deficits: Pt unable to recall injuries sustained and need to maintain NWB on all extremities Following Commands: Follows one step commands consistently    Balance  Balance Balance Assessed: Yes Static Sitting Balance Static Sitting - Balance Support: Right upper extremity supported;Left upper extremity supported Static Sitting - Level of Assistance: 4: Min assist Static Sitting - Comment/# of Minutes: Pt sat EOB during grooming tasks with for 10 mins with max encouragement. Dynamic Sitting Balance Dynamic Sitting - Balance Support: No upper extremity supported Dynamic Sitting - Level of Assistance: 3: Mod assist  End of Session PT - End of Session Activity Tolerance: Patient limited by pain;Patient limited by fatigue Patient left: in bed;with call bell/phone within reach Nurse Communication: Mobility status;Need for lift equipment  GP     [name] 01/03/2012, 2:52 PM [name] L. Worth Kober DPT [phone]
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
## 2012-01-03 NOTE — ED Notes (Signed)
Patient transported to CT 
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
## 2012-01-03 NOTE — Progress Notes (Signed)
Occupational Therapy Treatment Patient Details Name: [name] MRN: [id number] DOB: 08/09/1924 Today's Date: 01/03/2012 Time: 4098-1191 OT Time Calculation (min): 45 min  OT Assessment / Plan / Recommendation Comments on Treatment Session Pt still with limitations in mobility, selfcare, and endurance.  Only tolerating sitting EOB for 10 mins with max encouragement and overall mod assist.  Feel it is limited secondary to pain and anxiousness.   She is making some progress but will need extended rehab at SNF level.    Follow Up Recommendations  Skilled nursing facility       Equipment Recommendations  Defer to next venue       Frequency Min 1X/week   Plan Discharge plan remains appropriate    Precautions / Restrictions Precautions Precautions: Fall Restrictions Weight Bearing Restrictions: Yes LUE Weight Bearing: Non weight bearing RLE Weight Bearing: Non weight bearing LLE Weight Bearing: Non weight bearing   Pertinent Vitals/Pain Pt with pain in bilateral LEs, 4/10 in faces scale.  Helped reposition LEs at conclusion of session and meds given per schedule prior to session    ADL  Grooming: Performed;Wash/dry face;Moderate assistance Where Assessed - Grooming: Supported sitting;Other (comment) (edge of bed) Transfers/Ambulation Related to ADLs: Not attempted ADL Comments: Pt able to roll to the left side with mod facilitation for repositioning bed pad.  Needs max assist to roll to the right with max instructional cues to avoid pushing or pulling with the LUE.  Sat EOB for approxmately 10 mins but needed mod facilitation overall.  during sitting pt requested to lay back donw numerous times, needing max cues to stay sitting up.  Loss of balance to the right and posterior in sitting.    Perforemd AAROM exercises for bilateral shoulders and elbows as well as AROM exercises for the left digits.  Pt ablel to perform 1 set of 10 repetitions for each exercise.  Gross grasp and release  at approximatelyy 60 % of normal in the left hand.       OT Goals ADL Goals Pt Will Perform Grooming: with mod assist ADL Goal: Grooming - Progress: Met Arm Goals Arm Goal: PROM - Progress: Met Miscellaneous OT Goals OT Goal: Miscellaneous Goal #1 - Progress: Progressing toward goals OT Goal: Miscellaneous Goal #2 - Progress: Progressing toward goals OT Goal: Miscellaneous Goal #3 - Progress: Progressing toward goals  Visit Information  Last OT Received On: 01/03/12    Subjective Data  Subjective: I stay bored Patient Stated Goal: did not state but agreeable to attempt sitting EOB.      Cognition  Overall Cognitive Status: Impaired Area of Impairment: Memory Arousal/Alertness: Awake/alert Orientation Level: Disoriented to;Time Behavior During Session: Anxious Current Attention Level: Focused Following Commands: Follows one step commands consistently    Mobility Bed Mobility Bed Mobility: Rolling Right;Supine to Sit;Sit to Supine Rolling Right: 2: Max assist Rolling Left: 3: Mod assist Supine to Sit: 2: Max assist Sit to Supine: 1: +1 Total assist   Exercises  See ADL comments  Balance Balance Balance Assessed: Yes Static Sitting Balance Static Sitting - Balance Support: Right upper extremity supported;Left upper extremity supported Static Sitting - Level of Assistance: 4: Min assist Static Sitting - Comment/# of Minutes: Pt sat EOB during grooming tasks with for 10 mins with max encouragement. Dynamic Sitting Balance Dynamic Sitting - Balance Support: No upper extremity supported Dynamic Sitting - Level of Assistance: 3: Mod assist  End of Session OT - End of Session Activity Tolerance: Patient limited by fatigue Patient left:  in bed;with call bell/phone within reach     [facility] OTR/L 01/03/2012, 11:44 AM Pager number [phone]
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
## 2012-01-03 NOTE — Clinical Social Work Note (Signed)
Clinical Social Worker facilitated patient discharge including contacting patient family and facility and arranging ambulance transport to [facility].  CSW spoke with patient who is intermittently alert and oriented.  Patient agreeable to transfer to SNF today.  Patient family to meet patient at facility to complete paperwork.  RN to call report prior to discharge.  Clinical Social Worker will sign off for now as social work intervention is no longer needed. Please consult [place] again if new need arises.  [address] [place], [place] [phone]

## 2012-01-03 NOTE — Discharge Summary (Signed)
[name] Witkop, MD, MPH, FACS Pager: [phone]  

## 2012-01-04 ENCOUNTER — Encounter (HOSPITAL_COMMUNITY): Payer: Self-pay | Admitting: General Practice

## 2012-01-04 ENCOUNTER — Inpatient Hospital Stay (HOSPITAL_COMMUNITY): Payer: Medicare Other

## 2012-01-04 ENCOUNTER — Emergency Department (HOSPITAL_COMMUNITY): Payer: Medicare Other

## 2012-01-04 DIAGNOSIS — I2699 Other pulmonary embolism without acute cor pulmonale: Secondary | ICD-10-CM | POA: Diagnosis not present

## 2012-01-04 DIAGNOSIS — R0602 Shortness of breath: Secondary | ICD-10-CM | POA: Diagnosis not present

## 2012-01-04 DIAGNOSIS — Z515 Encounter for palliative care: Secondary | ICD-10-CM | POA: Diagnosis not present

## 2012-01-04 DIAGNOSIS — IMO0001 Reserved for inherently not codable concepts without codable children: Secondary | ICD-10-CM | POA: Diagnosis not present

## 2012-01-04 DIAGNOSIS — Z87828 Personal history of other (healed) physical injury and trauma: Secondary | ICD-10-CM

## 2012-01-04 DIAGNOSIS — R1310 Dysphagia, unspecified: Secondary | ICD-10-CM | POA: Diagnosis not present

## 2012-01-04 DIAGNOSIS — E46 Unspecified protein-calorie malnutrition: Secondary | ICD-10-CM | POA: Diagnosis present

## 2012-01-04 DIAGNOSIS — J9600 Acute respiratory failure, unspecified whether with hypoxia or hypercapnia: Secondary | ICD-10-CM

## 2012-01-04 DIAGNOSIS — I5033 Acute on chronic diastolic (congestive) heart failure: Secondary | ICD-10-CM | POA: Diagnosis not present

## 2012-01-04 DIAGNOSIS — IMO0002 Reserved for concepts with insufficient information to code with codable children: Secondary | ICD-10-CM | POA: Diagnosis not present

## 2012-01-04 DIAGNOSIS — R079 Chest pain, unspecified: Secondary | ICD-10-CM | POA: Diagnosis not present

## 2012-01-04 DIAGNOSIS — D638 Anemia in other chronic diseases classified elsewhere: Secondary | ICD-10-CM | POA: Diagnosis not present

## 2012-01-04 DIAGNOSIS — S42033A Displaced fracture of lateral end of unspecified clavicle, initial encounter for closed fracture: Secondary | ICD-10-CM | POA: Diagnosis not present

## 2012-01-04 DIAGNOSIS — Z66 Do not resuscitate: Secondary | ICD-10-CM | POA: Diagnosis present

## 2012-01-04 DIAGNOSIS — S5290XD Unspecified fracture of unspecified forearm, subsequent encounter for closed fracture with routine healing: Secondary | ICD-10-CM | POA: Diagnosis not present

## 2012-01-04 DIAGNOSIS — K219 Gastro-esophageal reflux disease without esophagitis: Secondary | ICD-10-CM | POA: Diagnosis present

## 2012-01-04 DIAGNOSIS — I309 Acute pericarditis, unspecified: Secondary | ICD-10-CM | POA: Diagnosis present

## 2012-01-04 DIAGNOSIS — Z79899 Other long term (current) drug therapy: Secondary | ICD-10-CM | POA: Diagnosis not present

## 2012-01-04 DIAGNOSIS — E039 Hypothyroidism, unspecified: Secondary | ICD-10-CM | POA: Diagnosis not present

## 2012-01-04 DIAGNOSIS — D62 Acute posthemorrhagic anemia: Secondary | ICD-10-CM | POA: Diagnosis present

## 2012-01-04 DIAGNOSIS — F411 Generalized anxiety disorder: Secondary | ICD-10-CM | POA: Diagnosis present

## 2012-01-04 DIAGNOSIS — I10 Essential (primary) hypertension: Secondary | ICD-10-CM | POA: Diagnosis present

## 2012-01-04 DIAGNOSIS — E870 Hyperosmolality and hypernatremia: Secondary | ICD-10-CM | POA: Diagnosis present

## 2012-01-04 DIAGNOSIS — Z96659 Presence of unspecified artificial knee joint: Secondary | ICD-10-CM | POA: Diagnosis not present

## 2012-01-04 DIAGNOSIS — E876 Hypokalemia: Secondary | ICD-10-CM | POA: Diagnosis not present

## 2012-01-04 DIAGNOSIS — S52599A Other fractures of lower end of unspecified radius, initial encounter for closed fracture: Secondary | ICD-10-CM | POA: Diagnosis not present

## 2012-01-04 DIAGNOSIS — R0902 Hypoxemia: Secondary | ICD-10-CM | POA: Diagnosis present

## 2012-01-04 DIAGNOSIS — J9819 Other pulmonary collapse: Secondary | ICD-10-CM | POA: Diagnosis not present

## 2012-01-04 DIAGNOSIS — I509 Heart failure, unspecified: Secondary | ICD-10-CM | POA: Diagnosis present

## 2012-01-04 DIAGNOSIS — I739 Peripheral vascular disease, unspecified: Secondary | ICD-10-CM | POA: Diagnosis present

## 2012-01-04 DIAGNOSIS — Z5189 Encounter for other specified aftercare: Secondary | ICD-10-CM | POA: Diagnosis not present

## 2012-01-04 DIAGNOSIS — S27329A Contusion of lung, unspecified, initial encounter: Secondary | ICD-10-CM | POA: Diagnosis not present

## 2012-01-04 DIAGNOSIS — R918 Other nonspecific abnormal finding of lung field: Secondary | ICD-10-CM | POA: Diagnosis not present

## 2012-01-04 DIAGNOSIS — I2789 Other specified pulmonary heart diseases: Secondary | ICD-10-CM | POA: Diagnosis present

## 2012-01-04 DIAGNOSIS — S8263XA Displaced fracture of lateral malleolus of unspecified fibula, initial encounter for closed fracture: Secondary | ICD-10-CM | POA: Diagnosis not present

## 2012-01-04 DIAGNOSIS — J90 Pleural effusion, not elsewhere classified: Secondary | ICD-10-CM | POA: Diagnosis not present

## 2012-01-04 DIAGNOSIS — Z1389 Encounter for screening for other disorder: Secondary | ICD-10-CM | POA: Diagnosis not present

## 2012-01-04 DIAGNOSIS — D72829 Elevated white blood cell count, unspecified: Secondary | ICD-10-CM | POA: Diagnosis present

## 2012-01-04 DIAGNOSIS — I517 Cardiomegaly: Secondary | ICD-10-CM | POA: Diagnosis not present

## 2012-01-04 LAB — CARDIAC PANEL(CRET KIN+CKTOT+MB+TROPI)
CK, MB: 3.2 ng/mL (ref 0.3–4.0)
CK, MB: 2.3 ng/mL (ref 0.3–4.0)
Relative Index: INVALID (ref 0.0–2.5)
Total CK: 39 U/L (ref 7–177)
Total CK: 31 U/L (ref 7–177)
Troponin I: <0.3 ng/mL (ref <0.30)
Troponin I: <0.3 ng/mL (ref <0.30)
Troponin I: <0.3 ng/mL (ref <0.30)

## 2012-01-04 LAB — COMPREHENSIVE METABOLIC PANEL
Albumin: 2.3 g/dL — ABNORMAL LOW (ref 3.5–5.2)
Alkaline Phosphatase: 102 U/L (ref 39–117)
BUN: 19 mg/dL (ref 6–23)
CO2: 28 mEq/L (ref 19–32)
Chloride: 107 mEq/L (ref 96–112)
Creatinine, Ser: 0.98 mg/dL (ref 0.50–1.10)
GFR calc non Af Amer: 52 mL/min — ABNORMAL LOW (ref >90)
Glucose, Bld: 110 mg/dL — ABNORMAL HIGH (ref 70–99)
Potassium: 2.6 mEq/L — CL (ref 3.5–5.1)
Total Bilirubin: 1.6 mg/dL — ABNORMAL HIGH (ref 0.3–1.2)

## 2012-01-04 LAB — CBC
MCV: 99.7 fL (ref 78.0–100.0)
Platelets: 297 10*3/uL (ref 150–400)
RDW: 19.1 % — ABNORMAL HIGH (ref 11.5–15.5)
WBC: 14.3 10*3/uL — ABNORMAL HIGH (ref 4.0–10.5)

## 2012-01-04 LAB — HEPARIN LEVEL (UNFRACTIONATED): Heparin Unfractionated: 0.17 IU/mL — ABNORMAL LOW (ref 0.30–0.70)

## 2012-01-04 LAB — MAGNESIUM: Magnesium: 1.6 mg/dL (ref 1.5–2.5)

## 2012-01-04 MED ORDER — IOHEXOL 350 MG/ML SOLN
100.0000 mL | Freq: Once | INTRAVENOUS | Status: AC | PRN
  Administered 2012-01-04: 100 mL via INTRAVENOUS

## 2012-01-04 MED ORDER — METOPROLOL TARTRATE 25 MG PO TABS
25.0000 mg | ORAL_TABLET | Freq: Two times a day (BID) | ORAL | Status: DC
  Administered 2012-01-04 – 2012-01-06 (×5): 25 mg via ORAL
  Filled 2012-01-04 (×6): qty 1

## 2012-01-04 MED ORDER — POTASSIUM CHLORIDE 10 MEQ/50ML IV SOLN
10.0000 meq | INTRAVENOUS | Status: AC
  Administered 2012-01-04 (×4): 10 meq via INTRAVENOUS
  Filled 2012-01-04 (×3): qty 50

## 2012-01-04 MED ORDER — HYDROCODONE-ACETAMINOPHEN 7.5-325 MG PO TABS: 1.0000 | ORAL_TABLET | Freq: Four times a day (QID) | ORAL | Status: DC | PRN

## 2012-01-04 MED ORDER — LORAZEPAM 0.5 MG PO TABS
0.5000 mg | ORAL_TABLET | Freq: Three times a day (TID) | ORAL | Status: DC | PRN
  Administered 2012-01-05: 0.5 mg via ORAL
  Filled 2012-01-04: qty 1

## 2012-01-04 MED ORDER — BACITRACIN ZINC 500 UNIT/GM EX OINT
1.0000 "application " | TOPICAL_OINTMENT | Freq: Every day | CUTANEOUS | Status: DC
  Administered 2012-01-04 – 2012-01-10 (×7): 1 via TOPICAL
  Filled 2012-01-04: qty 15

## 2012-01-04 MED ORDER — VITAMIN D (ERGOCALCIFEROL) 1.25 MG (50000 UNIT) PO CAPS: 50000.0000 [IU] | ORAL_CAPSULE | ORAL | Status: DC

## 2012-01-04 MED ORDER — HYDROCODONE-ACETAMINOPHEN 7.5-500 MG/15ML PO SOLN: 10.0000 mL | ORAL | Status: DC | PRN

## 2012-01-04 MED ORDER — POTASSIUM CHLORIDE IN NACL 20-0.9 MEQ/L-% IV SOLN
INTRAVENOUS | Status: DC
  Administered 2012-01-04: 20:00:00 via INTRAVENOUS
  Filled 2012-01-04 (×2): qty 1000

## 2012-01-04 MED ORDER — POTASSIUM CHLORIDE 10 MEQ/50ML IV SOLN
10.0000 meq | INTRAVENOUS | Status: AC
  Administered 2012-01-04 (×2): 10 meq via INTRAVENOUS
  Filled 2012-01-04 (×2): qty 50

## 2012-01-04 MED ORDER — POTASSIUM CHLORIDE CRYS ER 20 MEQ PO TBCR
40.0000 meq | EXTENDED_RELEASE_TABLET | Freq: Two times a day (BID) | ORAL | Status: DC
  Administered 2012-01-04 – 2012-01-06 (×4): 40 meq via ORAL
  Filled 2012-01-04: qty 1
  Filled 2012-01-04: qty 2
  Filled 2012-01-04: qty 1
  Filled 2012-01-04 (×3): qty 2

## 2012-01-04 MED ORDER — IMIPRAMINE HCL 25 MG PO TABS
25.0000 mg | ORAL_TABLET | Freq: Two times a day (BID) | ORAL | Status: DC
  Administered 2012-01-04 – 2012-01-06 (×6): 25 mg via ORAL
  Filled 2012-01-04 (×8): qty 1

## 2012-01-04 MED ORDER — PANTOPRAZOLE SODIUM 40 MG PO TBEC
40.0000 mg | DELAYED_RELEASE_TABLET | Freq: Every day | ORAL | Status: DC
  Administered 2012-01-04 – 2012-01-05 (×2): 40 mg via ORAL
  Filled 2012-01-04 (×2): qty 1

## 2012-01-04 MED ORDER — HEPARIN (PORCINE) IN NACL 100-0.45 UNIT/ML-% IJ SOLN
1250.0000 [IU]/h | INTRAMUSCULAR | Status: DC
  Administered 2012-01-04: 900 [IU]/h via INTRAVENOUS
  Administered 2012-01-05 – 2012-01-06 (×3): 1300 [IU]/h via INTRAVENOUS
  Administered 2012-01-07 – 2012-01-10 (×5): 1250 [IU]/h via INTRAVENOUS
  Filled 2012-01-04 (×13): qty 250

## 2012-01-04 MED ORDER — MORPHINE SULFATE 2 MG/ML IJ SOLN
2.0000 mg | INTRAMUSCULAR | Status: DC | PRN
  Administered 2012-01-05 – 2012-01-10 (×12): 2 mg via INTRAVENOUS
  Filled 2012-01-04 (×12): qty 1

## 2012-01-04 MED ORDER — POTASSIUM CHLORIDE 10 MEQ/50ML IV SOLN
INTRAVENOUS | Status: AC
  Administered 2012-01-04: 10 meq via INTRAVENOUS
  Filled 2012-01-04: qty 50

## 2012-01-04 MED ORDER — BUDESONIDE-FORMOTEROL FUMARATE 160-4.5 MCG/ACT IN AERO
2.0000 | INHALATION_SPRAY | Freq: Two times a day (BID) | RESPIRATORY_TRACT | Status: DC
Start: 2012-01-04 — End: 2012-01-10
  Administered 2012-01-04 – 2012-01-10 (×11): 2 via RESPIRATORY_TRACT
  Filled 2012-01-04: qty 6

## 2012-01-04 MED ORDER — SODIUM CHLORIDE 0.9 % IJ SOLN: 3.0000 mL | Freq: Two times a day (BID) | INTRAMUSCULAR | Status: DC

## 2012-01-04 MED ORDER — ACETAMINOPHEN 325 MG PO TABS: 650.0000 mg | ORAL_TABLET | Freq: Four times a day (QID) | ORAL | Status: DC | PRN

## 2012-01-04 MED ORDER — SODIUM CHLORIDE 0.9 % IJ SOLN
3.0000 mL | Freq: Two times a day (BID) | INTRAMUSCULAR | Status: DC
  Administered 2012-01-04: 3 mL via INTRAVENOUS

## 2012-01-04 MED ORDER — MORPHINE SULFATE 2 MG/ML IJ SOLN
1.0000 mg | INTRAMUSCULAR | Status: DC | PRN
  Administered 2012-01-04: 1 mg via INTRAVENOUS
  Filled 2012-01-04: qty 1

## 2012-01-04 MED ORDER — ACETAMINOPHEN 650 MG RE SUPP: 650.0000 mg | Freq: Four times a day (QID) | RECTAL | Status: DC | PRN

## 2012-01-04 MED ORDER — FUROSEMIDE 10 MG/ML IJ SOLN
80.0000 mg | Freq: Two times a day (BID) | INTRAMUSCULAR | Status: DC
  Administered 2012-01-04: 80 mg via INTRAVENOUS
  Filled 2012-01-04 (×3): qty 8

## 2012-01-04 MED ORDER — ONDANSETRON HCL 4 MG PO TABS: 4.0000 mg | ORAL_TABLET | Freq: Four times a day (QID) | ORAL | Status: DC | PRN

## 2012-01-04 MED ORDER — CLOPIDOGREL BISULFATE 75 MG PO TABS
75.0000 mg | ORAL_TABLET | Freq: Every day | ORAL | Status: DC
  Administered 2012-01-04 – 2012-01-06 (×2): 75 mg via ORAL
  Filled 2012-01-04 (×4): qty 1

## 2012-01-04 MED ORDER — BACITRACIN-NEOMYCIN-POLYMYXIN OINTMENT TUBE
1.0000 "application " | TOPICAL_OINTMENT | Freq: Every day | CUTANEOUS | Status: DC
  Administered 2012-01-04 – 2012-01-10 (×7): 1 via TOPICAL
  Filled 2012-01-04: qty 15

## 2012-01-04 MED ORDER — LEVOTHYROXINE SODIUM 100 MCG PO TABS
100.0000 ug | ORAL_TABLET | Freq: Every day | ORAL | Status: DC
  Administered 2012-01-04 – 2012-01-06 (×2): 100 ug via ORAL
  Filled 2012-01-04 (×4): qty 1

## 2012-01-04 MED ORDER — SUCRALFATE 1 GM/10ML PO SUSP
1.0000 g | Freq: Three times a day (TID) | ORAL | Status: DC
  Administered 2012-01-04 (×3): 1 g via ORAL
  Filled 2012-01-04 (×5): qty 10

## 2012-01-04 MED ORDER — VERAPAMIL HCL ER 240 MG PO TBCR
240.0000 mg | EXTENDED_RELEASE_TABLET | Freq: Every day | ORAL | Status: DC
  Administered 2012-01-04 – 2012-01-06 (×3): 240 mg via ORAL
  Filled 2012-01-04 (×3): qty 1

## 2012-01-04 MED ORDER — ONDANSETRON HCL 4 MG/2ML IJ SOLN: 4.0000 mg | Freq: Four times a day (QID) | INTRAMUSCULAR | Status: DC | PRN

## 2012-01-04 MED ORDER — ISOSORBIDE MONONITRATE ER 30 MG PO TB24
30.0000 mg | ORAL_TABLET | Freq: Every day | ORAL | Status: DC
  Administered 2012-01-04 – 2012-01-06 (×3): 30 mg via ORAL
  Filled 2012-01-04 (×3): qty 1

## 2012-01-04 MED ORDER — POTASSIUM CHLORIDE CRYS ER 20 MEQ PO TBCR
60.0000 meq | EXTENDED_RELEASE_TABLET | Freq: Two times a day (BID) | ORAL | Status: DC
  Administered 2012-01-04 (×2): 60 meq via ORAL
  Filled 2012-01-04 (×2): qty 3

## 2012-01-04 MED ORDER — ENSURE PUDDING PO PUDG
1.0000 | Freq: Three times a day (TID) | ORAL | Status: DC
  Administered 2012-01-04 – 2012-01-06 (×6): 1 via ORAL

## 2012-01-04 NOTE — Progress Notes (Signed)
Utilization Review Completed.  Ted Leonhart T  01/04/2012  
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
## 2012-01-04 NOTE — Procedures (Signed)
Central Venous Catheter Insertion Procedure Note [name] [id number] Mar 01, 1925  Procedure: Insertion of Central Venous Catheter Indications: Assessment of intravascular volume and Drug and/or fluid administration  Procedure Details Consent: Risks of procedure as well as the alternatives and risks of each were explained to the (patient/caregiver).  Consent for procedure obtained. Time Out: Verified patient identification, verified procedure, site/side was marked, verified correct patient position, special equipment/implants available, medications/allergies/relevent history reviewed, required imaging and test results available.  Performed  Maximum sterile technique was used including antiseptics, cap, gloves, gown, hand hygiene, mask and sheet. Skin prep: Chlorhexidine; local anesthetic administered A antimicrobial bonded/coated triple lumen catheter was placed in the right internal jugular vein using the Seldinger technique. Ultrasound used for vessel identification.  Evaluation Blood flow good Complications: No apparent complications Patient did tolerate procedure well. Chest X-ray ordered to verify placement.  CXR: pending.  [name] 01/04/2012, 5:42 AM
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
## 2012-01-04 NOTE — Progress Notes (Signed)
Speech Pathology Note  Received bedside swallow evaluation order.  Spoke with Sheralyn Boatman, NP regarding her recent MBSS results from 7/1, indicating a severe dysphagia requiring pureed food & pudding thick liquids only.  Would like to proceed with a repeat MBSS on 7/4 if medically able.  NP aware.  Myra Rude, M.S.,CCC-SLP Pager [phone] 01/04/2012, 2:52 PM

## 2012-01-04 NOTE — Progress Notes (Signed)
VASCULAR LAB PRELIMINARY  PRELIMINARY  PRELIMINARY  PRELIMINARY  Bilateral lower extremity venous Dopplers completed.    Preliminary report:  No obvious evidence of DVT or SVT in the bilateral lower extremities.  However, unable to scan popliteals and calves secondary to fractures and surgical dressings.  Keely Drennan, 01/04/2012, 4:03 PM

## 2012-01-04 NOTE — Progress Notes (Signed)
ANTICOAGULATION CONSULT NOTE - Initial Consult  Pharmacy Consult for Heparin Indication: pulmonary embolus    Labs:  Basename 01/04/12 1042 01/04/12 1040 01/04/12 0853 01/04/12 0311 01/03/12 2217 01/03/12 2059 01/03/12 0955  HGB -- -- 10.7* -- 11.2* -- --  HCT -- -- 32.9* -- 33.0* 33.1* --  PLT -- -- 297 -- -- 312 --  APTT -- -- -- -- -- -- --  LABPROT -- -- -- -- -- -- --  INR -- -- -- -- -- -- --  HEPARINUNFRC <0.10* -- -- -- -- -- --  CREATININE -- -- 0.98 -- 1.10 -- --  CKTOTAL -- 39 -- 37 -- -- 47  CKMB -- 3.2 -- 3.2 -- -- --  TROPONINI -- <0.30 -- <0.30 -- <0.30 --    Estimated Creatinine Clearance: 41.4 ml/min (by C-G formula based on Cr of 0.98).   Assessment: 78 yo female with PE for Heparin.  MD requested no bolus to be given.  First heparin level <0.1.  Heparin infusing through central line without problems per RN.    Goal of Therapy:  Heparin level 0.3-0.7 units/ml Monitor platelets by anticoagulation protocol: Yes   Plan:  Increase heparin to 1150 units/hr Check heparin level in 8 hours.  Continue daily CBC and heparin levels. Will Warfarin be started today?  [name] 01/04/2012,12:29 PM

## 2012-01-04 NOTE — Progress Notes (Signed)
CRITICAL VALUE ALERT  Critical value received:  Potassium of 2.6 Date of notification:  01/04/12 Time of notification:  0930(MD was rounding)  Critical value read back:yes  Nurse who received alert: Dyke Brackett MD notified (1st page):  Revonda Standard NP Time of first page:  During rounds MD notified (2nd page):  Time of second page:  Responding MD: check Potassium level after 6 runs of KCL is done Time MD responded:  0930
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
## 2012-01-04 NOTE — ED Notes (Signed)
MD at bedside. 

## 2012-01-04 NOTE — Clinical Social Work Psychosocial (Signed)
     Clinical Social Work Department BRIEF PSYCHOSOCIAL ASSESSMENT 01/04/2012  Patient:  SHELVY, HECKERT     Account Number:  [id number]     Admit date:  01/03/2012  Clinical Social Worker:  Margaree Mackintosh  Date/Time:  01/04/2012 03:58 PM  Referred by:  Physician  Date Referred:  01/04/2012 Referred for  SNF Placement   Other Referral:   Interview type:  Family Other interview type:   Dtr: [phone]    PSYCHOSOCIAL DATA Living Status:  FACILITY Admitted from facility:  [facility] Level of care:  Skilled Nursing Facility Primary support name:  [phone] Primary support relationship to patient:  CHILD, ADULT Degree of support available:   Adequate.    CURRENT CONCERNS Current Concerns  Post-Acute Placement   Other Concerns:    SOCIAL WORK ASSESSMENT / PLAN Clinical Social Worker received referral indicating that pt is from a SNF-[facility].  CSW reviewed chart and spoke with pt's dtr-[name].  CSW introduced self, explained role, and provided support.  CSW provided opportunity for dtr to process feelings.  Dtr confirmed plan for pt to return to [facility] once medically stable.  CSW to submit appropriate paperwork to SNF for their review.  CSW to continue to follow and assist as needed.   Assessment/plan status:  Psychosocial Support/Ongoing Assessment of Needs Other assessment/ plan:   Information/referral to community resources:   SNF    PATIENTS/FAMILYS RESPONSE TO PLAN OF CARE: Pt currently unable to fully participate in assessment due to medical condition. Dtr pleasant and appeared openly engaged.  Dtr thanked CSW for intervention.
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
## 2012-01-04 NOTE — Progress Notes (Signed)
ANTICOAGULATION CONSULT NOTE - Follow Up Consult  Pharmacy Consult for Heparin Indication: pulmonary embolus    Labs:  Basename 01/04/12 2004 01/04/12 1042 01/04/12 1040 01/04/12 0853 01/04/12 0311 01/03/12 2217 01/03/12 2059  HGB -- -- -- 10.7* -- 11.2* --  HCT -- -- -- 32.9* -- 33.0* 33.1*  PLT -- -- -- 297 -- -- 312  APTT -- -- -- -- -- -- --  LABPROT -- -- -- -- -- -- --  INR -- -- -- -- -- -- --  HEPARINUNFRC 0.17* <0.10* -- -- -- -- --  CREATININE -- -- -- 0.98 -- 1.10 --  CKTOTAL 31 -- 39 -- 37 -- --  CKMB 2.3 -- 3.2 -- 3.2 -- --  TROPONINI <0.30 -- <0.30 -- <0.30 -- --    Estimated Creatinine Clearance: 41.4 ml/min (by C-G formula based on Cr of 0.98).   Assessment: 78 yo female with new RLL PE .  MD requested no bolus to be given.  Heparin drip rate 1150 uts/hr heparin level 0.17.  No bleeding noted, IV line without problems per RN.    Goal of Therapy:  Heparin level 0.3-0.7 units/ml Monitor platelets by anticoagulation protocol: Yes   Plan:  Increase heparin to 1300 units/hr  Continue daily CBC and heparin levels.   [name] 01/04/2012,9:22 PM

## 2012-01-05 DIAGNOSIS — S27329A Contusion of lung, unspecified, initial encounter: Secondary | ICD-10-CM

## 2012-01-05 LAB — CBC
HCT: 30.9 % — ABNORMAL LOW (ref 36.0–46.0)
Hemoglobin: 9.7 g/dL — ABNORMAL LOW (ref 12.0–15.0)
MCHC: 31.4 g/dL (ref 30.0–36.0)
RBC: 3.02 MIL/uL — ABNORMAL LOW (ref 3.87–5.11)
WBC: 12.8 10*3/uL — ABNORMAL HIGH (ref 4.0–10.5)

## 2012-01-05 LAB — TSH: TSH: 8.033 u[IU]/mL — ABNORMAL HIGH (ref 0.350–4.500)

## 2012-01-05 LAB — BASIC METABOLIC PANEL
CO2: 28 mEq/L (ref 19–32)
Chloride: 116 mEq/L — ABNORMAL HIGH (ref 96–112)
Glucose, Bld: 124 mg/dL — ABNORMAL HIGH (ref 70–99)
Potassium: 4 mEq/L (ref 3.5–5.1)
Sodium: 150 mEq/L — ABNORMAL HIGH (ref 135–145)

## 2012-01-05 LAB — HEPARIN LEVEL (UNFRACTIONATED): Heparin Unfractionated: 0.41 IU/mL (ref 0.30–0.70)

## 2012-01-05 MED ORDER — DEXTROSE 5 % IV SOLN
INTRAVENOUS | Status: DC
Start: 2012-01-05 — End: 2012-01-05
  Administered 2012-01-05: 75 mL via INTRAVENOUS

## 2012-01-05 NOTE — Progress Notes (Signed)
Speech Language Pathology Dysphagia Treatment Patient Details Name: [name] MRN: [id number] DOB: 1925-01-29 Today's Date: 01/05/2012 Time: 4098-1191 SLP Time Calculation (min): 21 min  Assessment / Plan / Recommendation Clinical Impression  Treatment focused on f/u differential diagnosis of swallowing abilities at bedside to determine potential to advance to a po diet. Patient alert, calm, remains confused however able to follow basic commands, pleasant, and with some improvements in both WOB and phonation. SLP provided po trials (puree) via bites. Patient without overt s/s of aspiration with 4 ounces applesauce, requiring moderate verbal cues for dry swallow as indicated by previous MBS to clear mild pharyngeal residuals. Overall, swallowing function appears to be returning to recent discharge function. Recommend resuming pre-discharge diet, puree with pudding thick liquid, with close monitoring for tolerance. Although patient is able to self feed, recommend staff assist with feeding as well as allow for frequent breaks during meals to assist in energy conservation for improved safety of swallow. SLP will f/u closely.     Diet Recommendation  Initiate / Change Diet: Dysphagia 1 (puree);Pudding-thick liquid    SLP Plan Goals updated   Pertinent Vitals/Pain n/a   Swallowing Goals  SLP Swallowing Goals Patient will consume recommended diet without observed clinical signs of aspiration with: Minimal assistance Swallow Study Goal #1 - Progress: Not Met Patient will utilize recommended strategies during swallow to increase swallowing safety with: Minimal assistance Swallow Study Goal #2 - Progress: Not met Goal #3: Patient will consume clinician provided po trials at bedside with even respirations and no overt s/s of aspiration with mod assist for use of compensatory strategies to determine ability to return to discharge diet (dyspahgia 1, pudding thick).  Swallow Study Goal #3 - Progress:  Met  General Temperature Spikes Noted: No Respiratory Status: Supplemental O2 delivered via (comment) (3 L nasal cannula) Behavior/Cognition: Alert;Confused;Requires cueing Oral Cavity - Dentition: Dentures, bottom;Dentures, top Patient Positioning: Upright in bed  Oral Cavity - Oral Hygiene   complete by SLP prior to po trials  Dysphagia Treatment Treatment focused on: Upgraded PO texture trials;Utilization of compensatory strategies Treatment Methods/Modalities: Skilled observation;Differential diagnosis Patient observed directly with PO's: Yes Type of PO's observed: Dysphagia 1 (puree) Feeding: Able to feed self (however becomes more quickly fatigued) Type of cueing: Verbal Amount of cueing: Moderate   GO   Ferdinand Lango MA, CCC-SLP [phone]   Ferdinand Lango Meryl 01/05/2012, 8:37 AM

## 2012-01-05 NOTE — Progress Notes (Signed)
ANTICOAGULATION CONSULT NOTE - Follow Up Consult  Pharmacy Consult for Heparin Indication: pulmonary embolus    Labs:  Basename 01/05/12 0600 01/04/12 2004 01/04/12 1042 01/04/12 1040 01/04/12 0853 01/04/12 0311 01/03/12 2217 01/03/12 2059  HGB 9.7* -- -- -- 10.7* -- -- --  HCT 30.9* -- -- -- 32.9* -- 33.0* --  PLT 285 -- -- -- 297 -- -- 312  APTT -- -- -- -- -- -- -- --  LABPROT -- -- -- -- -- -- -- --  INR -- -- -- -- -- -- -- --  HEPARINUNFRC 0.41 0.17* <0.10* -- -- -- -- --  CREATININE 0.97 -- -- -- 0.98 -- 1.10 --  CKTOTAL -- 31 -- 39 -- 37 -- --  CKMB -- 2.3 -- 3.2 -- 3.2 -- --  TROPONINI -- <0.30 -- <0.30 -- <0.30 -- --    Estimated Creatinine Clearance: 46.8 ml/min (by C-G formula based on Cr of 0.97).   Assessment: 78 yo femaleon heparin for new RLL PE .  MD requested no bolus to be given.  Heparin drip rate 1300 uts/hr with therapeutic heparin level of 0.41.  No bleeding noted.  Goal of Therapy:  Heparin level 0.3-0.7 units/ml Monitor platelets by anticoagulation protocol: Yes   Plan:  1) Continue heparin at 1300 units/hr 2) Continue daily CBC and heparin levels 3) F/u plan for longterm anticoagulation (?warfarin or xarelto)  Christoper Fabian, PharmD, BCPS Clinical pharmacist, pager [phone] 01/05/2012,7:31 AM
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
## 2012-01-06 ENCOUNTER — Inpatient Hospital Stay (HOSPITAL_COMMUNITY): Payer: Medicare Other

## 2012-01-06 DIAGNOSIS — E876 Hypokalemia: Secondary | ICD-10-CM

## 2012-01-06 DIAGNOSIS — I2699 Other pulmonary embolism without acute cor pulmonale: Principal | ICD-10-CM

## 2012-01-06 LAB — CBC
HCT: 29.6 % — ABNORMAL LOW (ref 36.0–46.0)
MCV: 103.9 fL — ABNORMAL HIGH (ref 78.0–100.0)
RBC: 2.85 MIL/uL — ABNORMAL LOW (ref 3.87–5.11)
WBC: 9.8 10*3/uL (ref 4.0–10.5)

## 2012-01-06 LAB — BASIC METABOLIC PANEL
BUN: 19 mg/dL (ref 6–23)
CO2: 25 mEq/L (ref 19–32)
CO2: 30 mEq/L (ref 19–32)
Calcium: 8.8 mg/dL (ref 8.4–10.5)
Chloride: 117 mEq/L — ABNORMAL HIGH (ref 96–112)
Chloride: 116 mEq/L — ABNORMAL HIGH (ref 96–112)
Creatinine, Ser: 0.92 mg/dL (ref 0.50–1.10)
Creatinine, Ser: 1 mg/dL (ref 0.50–1.10)
Glucose, Bld: 139 mg/dL — ABNORMAL HIGH (ref 70–99)
Sodium: 153 mEq/L — ABNORMAL HIGH (ref 135–145)

## 2012-01-06 MED ORDER — FUROSEMIDE 10 MG/ML IJ SOLN
40.0000 mg | Freq: Two times a day (BID) | INTRAMUSCULAR | Status: DC
  Administered 2012-01-07 – 2012-01-08 (×3): 40 mg via INTRAVENOUS
  Filled 2012-01-06 (×5): qty 4

## 2012-01-06 MED ORDER — PANTOPRAZOLE SODIUM 40 MG IV SOLR
40.0000 mg | INTRAVENOUS | Status: DC
  Administered 2012-01-06 – 2012-01-07 (×2): 40 mg via INTRAVENOUS
  Filled 2012-01-06 (×3): qty 40

## 2012-01-06 MED ORDER — SODIUM CHLORIDE 0.9 % IV SOLN
INTRAVENOUS | Status: DC
  Administered 2012-01-06: 13:00:00 via INTRAVENOUS

## 2012-01-06 MED ORDER — METOPROLOL TARTRATE 1 MG/ML IV SOLN
5.0000 mg | Freq: Four times a day (QID) | INTRAVENOUS | Status: DC
  Administered 2012-01-06 – 2012-01-08 (×7): 5 mg via INTRAVENOUS
  Filled 2012-01-06 (×16): qty 5

## 2012-01-06 MED ORDER — FUROSEMIDE 10 MG/ML IJ SOLN
40.0000 mg | Freq: Once | INTRAMUSCULAR | Status: AC
  Administered 2012-01-06: 40 mg via INTRAVENOUS
  Filled 2012-01-06: qty 4

## 2012-01-06 MED ORDER — DEXTROSE-NACL 5-0.45 % IV SOLN
INTRAVENOUS | Status: DC
  Administered 2012-01-06: 17:00:00 via INTRAVENOUS

## 2012-01-06 MED ORDER — FUROSEMIDE 10 MG/ML IJ SOLN
40.0000 mg | Freq: Once | INTRAMUSCULAR | Status: AC
  Administered 2012-01-06: 40 mg via INTRAVENOUS

## 2012-01-06 MED ORDER — NITROGLYCERIN 0.2 MG/HR TD PT24
0.2000 mg | MEDICATED_PATCH | Freq: Every day | TRANSDERMAL | Status: DC
Start: 2012-01-06 — End: 2012-01-08
  Administered 2012-01-06 – 2012-01-07 (×2): 0.2 mg via TRANSDERMAL
  Filled 2012-01-06 (×3): qty 1

## 2012-01-06 MED ORDER — LEVOTHYROXINE SODIUM 100 MCG IV SOLR
50.0000 ug | Freq: Every day | INTRAVENOUS | Status: DC
  Administered 2012-01-07: 50 ug via INTRAVENOUS
  Filled 2012-01-06 (×2): qty 2.5

## 2012-01-06 MED ORDER — POTASSIUM CHLORIDE CRYS ER 20 MEQ PO TBCR
20.0000 meq | EXTENDED_RELEASE_TABLET | Freq: Two times a day (BID) | ORAL | Status: DC
Start: 2012-01-07 — End: 2012-01-07
  Filled 2012-01-06 (×2): qty 1

## 2012-01-06 MED ORDER — POTASSIUM CHLORIDE CRYS ER 20 MEQ PO TBCR
40.0000 meq | EXTENDED_RELEASE_TABLET | Freq: Once | ORAL | Status: AC
  Administered 2012-01-06: 40 meq via ORAL
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
## 2012-01-06 NOTE — Progress Notes (Signed)
Speech Language Pathology Dysphagia Treatment Patient Details Name: Troi Bechtold MRN: [id number] DOB: 1924-11-26 Today's Date: 01/06/2012 Time: 1210-1225 SLP Time Calculation (min): 15 min  Assessment / Plan / Recommendation Clinical Impression  Arrived to room to complete f/u diet tolerance assessment. RN voiced concern regarding increased congestion. Patient noted to be with increased wet upper airway noise on inhalation and exhalation, wet vocal quality with inability to clear despite max SLP cueing for hard cough. Additonally, breath support for strong phonation decreased from yesterdays evaluation at bedside. RN received orders to NT suction. Given overall poor secretion management and increased respiratory difficulty, patient not judged safe to continue pos. Discussed with NP who questions esophageal stricture given h/o Nissen. Plan to keep NPO for now. SLP will f/u, continueing to check readiness for resuming pos. MBS would be recommended prior given poor appearing tolerance of previous recommendations since re-admission.     Diet Recommendation  Initiate / Change Diet: NPO    SLP Plan Goals updated   Pertinent Vitals/Pain n/a   Swallowing Goals  SLP Swallowing Goals Goal #3: Patient will consume clinician provided po trials at bedside with even respirations and no overt s/s of aspiration with mod assist for use of compensatory strategies to determine ability to return to discharge diet (dyspahgia 1, pudding thick).  Swallow Study Goal #3 - Progress: Progressing toward goal  General Temperature Spikes Noted: No Respiratory Status: Supplemental O2 delivered via (comment) (2 L nasal cannula) Behavior/Cognition: Alert;Confused;Requires cueing Oral Cavity - Dentition: Dentures, bottom;Dentures, top Patient Positioning: Upright in bed       Dysphagia Treatment Treatment focused on: Skilled observation of diet tolerance Treatment Methods/Modalities: Skilled observation;Differential  diagnosis Patient observed directly with PO's: No Reason PO's not observed:  (increased congestion, poor secretion management)   GO   Ferdinand Lango MA, CCC-SLP [phone]   Khameron Gruenwald Meryl 01/06/2012, 2:51 PM

## 2012-01-06 NOTE — Progress Notes (Signed)
ANTICOAGULATION CONSULT NOTE - Follow Up Consult  Pharmacy Consult for Heparin Indication: pulmonary embolus    Labs:  Basename 01/06/12 0500 01/05/12 0600 01/04/12 2004 01/04/12 1040 01/04/12 0853 01/04/12 0311  HGB 9.0* 9.7* -- -- -- --  HCT 29.6* 30.9* -- -- 32.9* --  PLT 251 285 -- -- 297 --  APTT -- -- -- -- -- --  LABPROT -- -- -- -- -- --  INR -- -- -- -- -- --  HEPARINUNFRC 0.47 0.41 0.17* -- -- --  CREATININE 0.92 0.97 -- -- 0.98 --  CKTOTAL -- -- 31 39 -- 37  CKMB -- -- 2.3 3.2 -- 3.2  TROPONINI -- -- <0.30 <0.30 -- <0.30    Estimated Creatinine Clearance: 49.4 ml/min (by C-G formula based on Cr of 0.92).   Assessment: 78 yo femaleon heparin for new RLL PE .  MD requested no bolus to be given.  Heparin drip rate 1300 uts/hr with therapeutic heparin level of 0.47.  No bleeding noted.  Platelet count stable.  H/H down slightly.  Goal of Therapy:  Heparin level 0.3-0.7 units/ml Monitor platelets by anticoagulation protocol: Yes   Plan:  1) Continue heparin at 1300 units/hr 2) Continue daily CBC and heparin levels 3) F/u plan for longterm anticoagulation (?warfarin or xarelto)  Celedonio Miyamoto, PharmD, BCPS Clinical Pharmacist Pager [phone]  01/06/2012,7:38 AM
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
## 2012-01-06 NOTE — Progress Notes (Signed)
nts the pt with little removal of secretions. Pt has audible rhonchi.  Secretions were small in amt and were blood tinged. Pt tolerated fairly well.

## 2012-01-06 NOTE — Progress Notes (Signed)
Update on patient since giving Lasix 80 mg IV around noon today. NTG patch placed around 1:30 PM as well. I/O - 1838 cc. CVP down to 6. On exam, diffuse rhonchi and tachypnea has improved. Respiratory effort more relaxed. Given good diuresis with respiratory improvement and noted weight gain from baseline this admission, will plan for Lasix 40mg  IV BID tomorrow with K supplementation. KCl 40 mEq given today. Will order daily BMET to assess electrolytes and renal function as well.    Jacqulyn Bath, PA-C 01/06/2012 4:32 PM

## 2012-01-06 NOTE — Progress Notes (Signed)
Room 2608 - [name] -    PMT-Palliative Medicine Team - RN Liaison Visit  PMT consult ordered for GOC.  Discussed via phone with dtr [name] [phone]).   Scheduled for Saturday 7/6  @ 2pm.  Please call PMT phone @ [phone] with any questions.  Thank you. Chalmers Cater, RN Palliative Medicine Team RN Liaison [phone]

## 2012-01-06 NOTE — Progress Notes (Signed)
Patient with audible congestion/rhonchi and sats 88% on 2L nasal cannula NT suctioned for large amounts of thick tan secretions.  Patient was preoxygenated with 6L prior to procedure.  Right nare used with minimal bleeding.  Sats increased to 94% after procedure.  Vitals stable throughout.  RN at bedside.

## 2012-01-06 NOTE — Progress Notes (Signed)
Clinical Social Worker staffed case with RNCM.  CSW to continue to follow and assist as needed.   Kilian Schwartz, MSW, LCSWA [phone] 

## 2012-01-07 ENCOUNTER — Inpatient Hospital Stay (HOSPITAL_COMMUNITY): Payer: Medicare Other

## 2012-01-07 DIAGNOSIS — R1310 Dysphagia, unspecified: Secondary | ICD-10-CM

## 2012-01-07 DIAGNOSIS — K219 Gastro-esophageal reflux disease without esophagitis: Secondary | ICD-10-CM

## 2012-01-07 LAB — GLUCOSE, CAPILLARY: Glucose-Capillary: 107 mg/dL — ABNORMAL HIGH (ref 70–99)

## 2012-01-07 LAB — CBC
HCT: 29.4 % — ABNORMAL LOW (ref 36.0–46.0)
Hemoglobin: 8.7 g/dL — ABNORMAL LOW (ref 12.0–15.0)
MCV: 107.7 fL — ABNORMAL HIGH (ref 78.0–100.0)
RDW: 19.9 % — ABNORMAL HIGH (ref 11.5–15.5)
WBC: 12.2 10*3/uL — ABNORMAL HIGH (ref 4.0–10.5)

## 2012-01-07 LAB — BASIC METABOLIC PANEL
BUN: 24 mg/dL — ABNORMAL HIGH (ref 6–23)
Chloride: 118 mEq/L — ABNORMAL HIGH (ref 96–112)
Creatinine, Ser: 1.09 mg/dL (ref 0.50–1.10)
GFR calc Af Amer: 52 mL/min — ABNORMAL LOW (ref >90)
Glucose, Bld: 123 mg/dL — ABNORMAL HIGH (ref 70–99)

## 2012-01-07 MED ORDER — SCOPOLAMINE 1 MG/3DAYS TD PT72
1.0000 | MEDICATED_PATCH | TRANSDERMAL | Status: DC
  Administered 2012-01-07 – 2012-01-10 (×2): 1.5 mg via TRANSDERMAL
  Filled 2012-01-07 (×2): qty 1

## 2012-01-07 MED ORDER — CHLORHEXIDINE GLUCONATE 0.12 % MT SOLN
15.0000 mL | Freq: Two times a day (BID) | OROMUCOSAL | Status: DC
  Administered 2012-01-07 – 2012-01-10 (×7): 15 mL via OROMUCOSAL
  Filled 2012-01-07 (×9): qty 15

## 2012-01-07 MED ORDER — POTASSIUM CHLORIDE 10 MEQ/100ML IV SOLN
10.0000 meq | INTRAVENOUS | Status: AC
  Administered 2012-01-07 (×2): 10 meq via INTRAVENOUS
  Filled 2012-01-07 (×2): qty 100

## 2012-01-07 MED ORDER — DEXTROSE 5 % IV SOLN
INTRAVENOUS | Status: DC
  Administered 2012-01-07: 1000 mL via INTRAVENOUS
  Administered 2012-01-08: 11:00:00 via INTRAVENOUS

## 2012-01-07 MED ORDER — BIOTENE DRY MOUTH MT LIQD
15.0000 mL | Freq: Two times a day (BID) | OROMUCOSAL | Status: DC
  Administered 2012-01-07 – 2012-01-10 (×7): 15 mL via OROMUCOSAL

## 2012-01-07 NOTE — Progress Notes (Signed)
ANTICOAGULATION CONSULT NOTE - Follow Up Consult  Pharmacy Consult for Heparin Indication: pulmonary embolus    Labs:  Basename 01/07/12 0624 01/06/12 1700 01/06/12 0500 01/05/12 0600 01/04/12 2004 01/04/12 1040  HGB 8.7* -- 9.0* -- -- --  HCT 29.4* -- 29.6* 30.9* -- --  PLT 226 -- 251 285 -- --  APTT -- -- -- -- -- --  LABPROT -- -- -- -- -- --  INR -- -- -- -- -- --  HEPARINUNFRC 0.63 -- 0.47 0.41 -- --  CREATININE 1.09 1.00 0.92 -- -- --  CKTOTAL -- -- -- -- 31 39  CKMB -- -- -- -- 2.3 3.2  TROPONINI -- -- -- -- <0.30 <0.30    Estimated Creatinine Clearance: 40.9 ml/min (by C-G formula based on Cr of 1.09).   Assessment: 78 yo femaleon heparin for new RLL PE .  MD requested no bolus to be given.  Heparin drip rate 1300 uts/hr with therapeutic heparin level of 0.63 but trending up.  No bleeding noted.  Platelet count stable.  H/H down slightly.  Goal of Therapy:  Heparin level 0.3-0.7 units/ml Monitor platelets by anticoagulation protocol: Yes   Plan:  1) Decrease heparin to 1250 units/hr 2) Continue daily CBC and heparin levels 3) F/u plan for longterm anticoagulation (?warfarin or xarelto)  Talbert Cage, PharmD Clinical Pharmacist Pager [phone]  01/07/2012,9:32 AM

## 2012-01-07 NOTE — Progress Notes (Signed)
sux pt at this time, but got little secretions out. Still sounding very rhonchus.
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
## 2012-01-07 NOTE — Progress Notes (Signed)
Report called to RN on 6700.  Pt to be transferred with IV, O2, and no tele.  NT and RN to accompany pt.

## 2012-01-07 NOTE — Progress Notes (Signed)
SUBJECTIVE:  She denies any pain and says that she is breathing OK.   PHYSICAL EXAM Filed Vitals:   01/07/12 0500 01/07/12 0600 01/07/12 0700 01/07/12 0812  BP: 123/60 136/56 126/56 130/59  Pulse: 88 87 80 83  Temp:    98.7 F (37.1 C)  TempSrc:    Oral  Resp: 30 25 30  32  Height:      Weight:      SpO2:    100%   General:  Very frail looking Lungs:  Decreased breath sounds with few crackles Heart:  RRR Abdomen:  Distended with positive bowel sounds Extremities:  Mild diffuse edema.  LABS:  Results for orders placed during the hospital encounter of 01/03/12 (from the past 24 hour(s))  BASIC METABOLIC PANEL     Status: Abnormal   Collection Time   01/06/12  5:00 PM      Component Value Range   Sodium 153 (*) 135 - 145 mEq/L   Potassium 3.7  3.5 - 5.1 mEq/L   Chloride 116 (*) 96 - 112 mEq/L   CO2 30  19 - 32 mEq/L   Glucose, Bld 139 (*) 70 - 99 mg/dL   BUN 22  6 - 23 mg/dL   Creatinine, Ser 7.82  0.50 - 1.10 mg/dL   Calcium 8.8  8.4 - 95.6 mg/dL   GFR calc non Af Amer 50 (*) >90 mL/min   GFR calc Af Amer 58 (*) >90 mL/min  HEPARIN LEVEL (UNFRACTIONATED)     Status: Normal   Collection Time   01/07/12  6:24 AM      Component Value Range   Heparin Unfractionated 0.63  0.30 - 0.70 IU/mL  CBC     Status: Abnormal   Collection Time   01/07/12  6:24 AM      Component Value Range   WBC 12.2 (*) 4.0 - 10.5 K/uL   RBC 2.73 (*) 3.87 - 5.11 MIL/uL   Hemoglobin 8.7 (*) 12.0 - 15.0 g/dL   HCT 21.3 (*) 08.6 - 57.8 %   MCV 107.7 (*) 78.0 - 100.0 fL   MCH 31.9  26.0 - 34.0 pg   MCHC 29.6 (*) 30.0 - 36.0 g/dL   RDW 46.9 (*) 62.9 - 52.8 %   Platelets 226  150 - 400 K/uL  BASIC METABOLIC PANEL     Status: Abnormal   Collection Time   01/07/12  6:24 AM      Component Value Range   Sodium 154 (*) 135 - 145 mEq/L   Potassium 3.9  3.5 - 5.1 mEq/L   Chloride 118 (*) 96 - 112 mEq/L   CO2 29  19 - 32 mEq/L   Glucose, Bld 123 (*) 70 - 99 mg/dL   BUN 24 (*) 6 - 23 mg/dL   Creatinine,  Ser 4.13  0.50 - 1.10 mg/dL   Calcium 8.6  8.4 - 24.4 mg/dL   GFR calc non Af Amer 45 (*) >90 mL/min   GFR calc Af Amer 52 (*) >90 mL/min    Intake/Output Summary (Last 24 hours) at 01/07/12 0847 Last data filed at 01/07/12 0700  Gross per 24 hour  Intake    887 ml  Output   3825 ml  Net  -2938 ml     ASSESSMENT AND PLAN:   1) Acute pulmonary embolism:  On heparin.  Plan per primary team.   2) Diastolic dysfunction:  I would continue IV Lasix again today then PO in the am  3) Pericardial effusion:  No evidence of tamponade.  No further work up planned.   4) Hypernatremia:  Na creeping up.  Probable free water deficit with decreased free water intake exacerbated by diuresis.  Could consider work up for diabetes insipidus if it increases further and plans are for aggressive care.       Fayrene Fearing [facility] 01/07/2012 8:47 AM

## 2012-01-07 NOTE — Progress Notes (Signed)
Speech Language Pathology Dysphagia Treatment Patient Details Name: [name] MRN: [id number] DOB: February 03, 1925 Today's Date: 01/07/2012 Time: 1250-1330 SLP Time Calculation (min): 40 min  Assessment / Plan / Recommendation Clinical Impression  Purpose of diagnostic treatment check readiness for MBS as patient made NPO on 01/06/12.  Patient administered puree consistency by treating SLP x4 with max verbal, visual and tactile cues to complete strategy of double swallow to decrease residuals to decrease risk of aspiration after the swallow.   Initiation of swallow moderately delayed. RR increased  from 17 to 27 with PO trials .  Patient unable to complete instructed throat clears despite max verbal  cues.  Wet coughs  noted  moderately after PO trials.  Recommend continued NPO status given continued respiratory difficulty increasing risk for aspiration.  ST will f/u on 01/08/12 for MBS readiness to recommend safest, possible PO diet.     Diet Recommendation  Continue with Current Diet: NPO    SLP Plan Continue with current plan of care;MBS;New goals to be determined pending objective testing      Swallowing Goals  SLP Swallowing Goals Swallow Study Goal #1 - Progress: Not Met Swallow Study Goal #2 - Progress: Not met Swallow Study Goal #3 - Progress: Progressing toward goal  General Temperature Spikes Noted: No Respiratory Status: Supplemental O2 delivered via (comment) (venturi mask) Behavior/Cognition: Pleasant mood;Cooperative;Alert Oral Cavity - Dentition: Dentures, bottom;Dentures, top Patient Positioning: Upright in bed  Oral Cavity - Oral Hygiene Does patient have any of the following "at risk" factors?: Nutritional status - inadequate;Oxygen therapy - cannula, mask, simple oxygen devices Patient is HIGH RISK - Oral Care Protocol followed (see row info): Yes Patient is AT RISK - Oral Care Protocol followed (see row info): Yes   Dysphagia Treatment Treatment focused on: Upgraded PO  texture trials;Facilitation of pharyngeal phase Treatment Methods/Modalities: Skilled observation;Differential diagnosis Type of PO's observed: Dysphagia 1 (puree) Feeding: Total assist Pharyngeal Phase Signs & Symptoms: Suspected delayed swallow initiation;Delayed cough Type of cueing: Verbal;Tactile;Visual Amount of cueing: Maximal   Moreen Fowler MS, CCC-SLP [phone]     [facility] 01/07/2012, 1:46 PM
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
## 2012-01-08 DIAGNOSIS — S42033A Displaced fracture of lateral end of unspecified clavicle, initial encounter for closed fracture: Secondary | ICD-10-CM

## 2012-01-08 DIAGNOSIS — S8263XA Displaced fracture of lateral malleolus of unspecified fibula, initial encounter for closed fracture: Secondary | ICD-10-CM

## 2012-01-08 DIAGNOSIS — I309 Acute pericarditis, unspecified: Secondary | ICD-10-CM

## 2012-01-08 LAB — CBC
Hemoglobin: 9.4 g/dL — ABNORMAL LOW (ref 12.0–15.0)
MCH: 32.1 pg (ref 26.0–34.0)
Platelets: 211 10*3/uL (ref 150–400)
RBC: 2.93 MIL/uL — ABNORMAL LOW (ref 3.87–5.11)
WBC: 9.5 10*3/uL (ref 4.0–10.5)

## 2012-01-08 LAB — BASIC METABOLIC PANEL
CO2: 30 mEq/L (ref 19–32)
Calcium: 8.7 mg/dL (ref 8.4–10.5)
Chloride: 108 mEq/L (ref 96–112)
Glucose, Bld: 108 mg/dL — ABNORMAL HIGH (ref 70–99)
Potassium: 2.9 mEq/L — ABNORMAL LOW (ref 3.5–5.1)
Sodium: 146 mEq/L — ABNORMAL HIGH (ref 135–145)

## 2012-01-08 LAB — HEPARIN LEVEL (UNFRACTIONATED): Heparin Unfractionated: 0.42 IU/mL (ref 0.30–0.70)

## 2012-01-08 MED ORDER — LEVOTHYROXINE SODIUM 100 MCG PO TABS
100.0000 ug | ORAL_TABLET | Freq: Every day | ORAL | Status: DC
  Administered 2012-01-08 – 2012-01-10 (×3): 100 ug via ORAL
  Filled 2012-01-08 (×4): qty 1

## 2012-01-08 MED ORDER — METOPROLOL TARTRATE 12.5 MG HALF TABLET
12.5000 mg | ORAL_TABLET | Freq: Two times a day (BID) | ORAL | Status: DC
  Administered 2012-01-08 – 2012-01-10 (×5): 12.5 mg via ORAL
  Filled 2012-01-08 (×6): qty 1

## 2012-01-08 MED ORDER — PANTOPRAZOLE SODIUM 40 MG PO TBEC: 40.0000 mg | DELAYED_RELEASE_TABLET | Freq: Every day | ORAL | Status: DC

## 2012-01-08 MED ORDER — PANTOPRAZOLE SODIUM 40 MG PO PACK
40.0000 mg | PACK | Freq: Every day | ORAL | Status: DC
  Administered 2012-01-08 – 2012-01-10 (×3): 40 mg
  Filled 2012-01-08 (×3): qty 20

## 2012-01-08 MED ORDER — POTASSIUM CHLORIDE 20 MEQ/15ML (10%) PO LIQD
60.0000 meq | Freq: Once | ORAL | Status: AC
  Administered 2012-01-08: 60 meq via ORAL
  Filled 2012-01-08: qty 45

## 2012-01-08 MED ORDER — MORPHINE SULFATE ER 15 MG PO TBCR
15.0000 mg | EXTENDED_RELEASE_TABLET | Freq: Every day | ORAL | Status: DC
  Administered 2012-01-08 – 2012-01-09 (×2): 15 mg via ORAL
  Filled 2012-01-08 (×2): qty 1

## 2012-01-08 MED ORDER — ISOSORBIDE MONONITRATE ER 30 MG PO TB24
30.0000 mg | ORAL_TABLET | Freq: Every day | ORAL | Status: DC
  Administered 2012-01-08 – 2012-01-09 (×2): 30 mg via ORAL
  Filled 2012-01-08 (×3): qty 1

## 2012-01-08 MED ORDER — FUROSEMIDE 40 MG PO TABS
60.0000 mg | ORAL_TABLET | Freq: Two times a day (BID) | ORAL | Status: DC
  Administered 2012-01-08 – 2012-01-10 (×6): 60 mg via ORAL
  Filled 2012-01-08 (×7): qty 1

## 2012-01-08 MED ORDER — SODIUM CHLORIDE 0.9 % IJ SOLN
10.0000 mL | Freq: Two times a day (BID) | INTRAMUSCULAR | Status: DC
  Administered 2012-01-10: 20 mL

## 2012-01-08 MED ORDER — SODIUM CHLORIDE 0.9 % IJ SOLN
10.0000 mL | INTRAMUSCULAR | Status: DC | PRN
  Administered 2012-01-08 (×2): 10 mL

## 2012-01-08 NOTE — Progress Notes (Signed)
SUBJECTIVE:  She says that she is breathing OK.  I am having a hard time understanding her.     PHYSICAL EXAM Filed Vitals:   01/07/12 2100 01/08/12 0015 01/08/12 0538 01/08/12 0749  BP: 104/55 162/70 154/77   Pulse: 58 85 83   Temp: 97.8 F (36.6 C) 97.9 F (36.6 C) 97.8 F (36.6 C)   TempSrc: Oral Oral Oral   Resp: 22 24 20    Height:      Weight: 164 lb 3.9 oz (74.5 kg)     SpO2: 90% 98% 98% 97%   General:  Very frail looking but no distress. Lungs:  Decreased breath sounds with few crackles Heart:  RRR Abdomen:  Distended with positive bowel sounds Extremities:  Mild diffuse edema.  LABS:  Results for orders placed during the hospital encounter of 01/03/12 (from the past 24 hour(s))  GLUCOSE, CAPILLARY     Status: Normal   Collection Time   01/07/12 11:56 AM      Component Value Range   Glucose-Capillary 92  70 - 99 mg/dL  GLUCOSE, CAPILLARY     Status: Abnormal   Collection Time   01/07/12  4:31 PM      Component Value Range   Glucose-Capillary 107 (*) 70 - 99 mg/dL  HEPARIN LEVEL (UNFRACTIONATED)     Status: Normal   Collection Time   01/08/12  6:00 AM      Component Value Range   Heparin Unfractionated 0.42  0.30 - 0.70 IU/mL  CBC     Status: Abnormal   Collection Time   01/08/12  6:00 AM      Component Value Range   WBC 9.5  4.0 - 10.5 K/uL   RBC 2.93 (*) 3.87 - 5.11 MIL/uL   Hemoglobin 9.4 (*) 12.0 - 15.0 g/dL   HCT 40.9 (*) 81.1 - 91.4 %   MCV 105.1 (*) 78.0 - 100.0 fL   MCH 32.1  26.0 - 34.0 pg   MCHC 30.5  30.0 - 36.0 g/dL   RDW 78.2 (*) 95.6 - 21.3 %   Platelets 211  150 - 400 K/uL  BASIC METABOLIC PANEL     Status: Abnormal   Collection Time   01/08/12  6:00 AM      Component Value Range   Sodium 146 (*) 135 - 145 mEq/L   Potassium 2.9 (*) 3.5 - 5.1 mEq/L   Chloride 108  96 - 112 mEq/L   CO2 30  19 - 32 mEq/L   Glucose, Bld 108 (*) 70 - 99 mg/dL   BUN 24 (*) 6 - 23 mg/dL   Creatinine, Ser 0.86  0.50 - 1.10 mg/dL   Calcium 8.7  8.4 - 57.8  mg/dL   GFR calc non Af Amer 51 (*) >90 mL/min   GFR calc Af Amer 59 (*) >90 mL/min    Intake/Output Summary (Last 24 hours) at 01/08/12 1040 Last data filed at 01/08/12 0600  Gross per 24 hour  Intake   1654 ml  Output   1750 ml  Net    -96 ml     ASSESSMENT AND PLAN:   1) Acute pulmonary embolism:  On heparin.  Plan per primary team.   2) Diastolic dysfunction:  Switched to Lasix PO.     3) Pericardial effusion:  No evidence of tamponade.  No further work up planned.   4) Hypernatremia:  Na is down today.  No further work up planned  Plans for NHP  with palliative care noted.  We will sign off.  Please call with further questions.      Fayrene Fearing [facility] 01/08/2012 10:40 AM

## 2012-01-08 NOTE — Progress Notes (Signed)
Subjective: Patient seen and examined, looks much better. Complains of pain in legs . Says she could not sleep last night.  Filed Vitals:   01/08/12 0538  BP: 154/77  Pulse: 83  Temp: 97.8 F (36.6 C)  Resp: 20    Chest: Clear Bilaterally Heart : S1S2 RRR Abdomen: Soft, nontender Ext : No edema Neuro: Alert, oriented x 3  A/P Dyspnea- Improved.  Secretions- Improved. Continue scopolamine patch  Pain- Will start MS contin 15 mg po qhs Continue iv morphine prn  Dispo- Discussed with daughter, as patient has improved, she can go to SNF with palliative care follow up. Will fill out a MOST form at discharge.      Meredeth Ide Palliative Medicine Team [phone]
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
## 2012-01-08 NOTE — Progress Notes (Signed)
SLP Cancellation Note  ST aware of Palliative Care Consult. ST to follow for POC on 01/09/12 to provide education as warranted to caregivers on strategies to maximize safety of the swallow . Moreen Fowler MS, CCC-SLP [phone]  [facility] 01/08/2012, 3:58 PM

## 2012-01-08 NOTE — Progress Notes (Signed)
ANTICOAGULATION CONSULT NOTE - Follow Up Consult  Pharmacy Consult for Heparin Indication: pulmonary embolus    Labs:  Basename 01/08/12 0600 01/07/12 0624 01/06/12 1700 01/06/12 0500  HGB 9.4* 8.7* -- --  HCT 30.8* 29.4* -- 29.6*  PLT 211 226 -- 251  APTT -- -- -- --  LABPROT -- -- -- --  INR -- -- -- --  HEPARINUNFRC 0.42 0.63 -- 0.47  CREATININE 0.99 1.09 1.00 --  CKTOTAL -- -- -- --  CKMB -- -- -- --  TROPONINI -- -- -- --    Estimated Creatinine Clearance: 44.5 ml/min (by C-G formula based on Cr of 0.99).   Assessment: 78 yo femaleon heparin for PE .  MD requested no bolus to be given.  Heparin drip rate 1250 uts/hr with therapeutic heparin level of 0.42.  No bleeding noted.  Platelet count stable.   Goal of Therapy:  Heparin level 0.3-0.7 units/ml Monitor platelets by anticoagulation protocol: Yes   Plan:  1) Continue heparin at 1250 units/hr 2) Continue daily CBC and heparin levels   Wendie Simmer, PharmD, BCPS Clinical Pharmacist  Pager: [phone]

## 2012-01-09 LAB — BASIC METABOLIC PANEL
BUN: 30 mg/dL — ABNORMAL HIGH (ref 6–23)
CO2: 30 mEq/L (ref 19–32)
Chloride: 104 mEq/L (ref 96–112)
GFR calc Af Amer: 47 mL/min — ABNORMAL LOW (ref >90)
Potassium: 3 mEq/L — ABNORMAL LOW (ref 3.5–5.1)

## 2012-01-09 LAB — CBC
HCT: 30.5 % — ABNORMAL LOW (ref 36.0–46.0)
RBC: 2.98 MIL/uL — ABNORMAL LOW (ref 3.87–5.11)
RDW: 19.1 % — ABNORMAL HIGH (ref 11.5–15.5)
WBC: 8.5 10*3/uL (ref 4.0–10.5)

## 2012-01-09 LAB — HEPARIN LEVEL (UNFRACTIONATED): Heparin Unfractionated: 0.39 IU/mL (ref 0.30–0.70)
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
## 2012-01-09 NOTE — Progress Notes (Signed)
Introduced myself and chaplain services to pt's daughter.  Daughter is upset.  She does not feel like her mother's wishes are being heard.  I offered emotional support and calming presence.  Pt awoke during visit.  I spoke with her about her visit in the hospital.  We ended the visit with prayer.  I plan to follow up with daughter and with the pt.  Please page me if further assistance is needed. Dellie Catholic  [phone] personal pager   [phone] oncall pager

## 2012-01-09 NOTE — Progress Notes (Signed)
Speech Language Pathology Dysphagia Treatment Patient Details Name: [name] MRN: [id number] DOB: 09/09/1924 Today's Date: 01/09/2012 Time: 4098-1191 SLP Time Calculation (min): 30 min  Assessment / Plan / Recommendation Clinical Impression  Treatment focused on facilitation of safe swallowing strategies and patient/family education. Reviewed chart and noted plans for comfort care with inclusion of thin liquids in diet with known risk of aspiration. SLP observed patient with a small amount of pureed solid and thin liquid. Vocal quality and breath support better today. Patient without overt s/s of aspiration with pureed solids, cough post swallow with thin liquids in 1/5 trials which is to be expected given known aspiration on previous MBS. Mod verbal cues provided to encourage continued use of dry swallows after each bite/sip and frequent rest breaks in attempts to decrease aspiration risk despite comfort care decisions. Educated daughter regarding this also via phone and answered any questions. Daughter verbalized understanding of information taught, aware of aspiration risk. No further skilled SLP needed at this time given new decisions for comfort care however patient may benefit from brief f/u at SNF for staff education and to ensure accurate diet recommendations.     Diet Recommendation  Continue with Current Diet: Dysphagia 1 (puree);Thin liquid (with known aspiration risk)    SLP Plan Discharge SLP treatment due to (comment) (plans now for comfort care)   Pertinent Vitals/Pain n/a   Swallowing Goals  SLP Swallowing Goals Patient will consume recommended diet without observed clinical signs of aspiration with: Minimal assistance Swallow Study Goal #1 - Progress: Not Met Patient will utilize recommended strategies during swallow to increase swallowing safety with: Minimal assistance Swallow Study Goal #2 - Progress: Not met Goal #3: Patient will consume clinician provided po trials at  bedside with even respirations and no overt s/s of aspiration with mod assist for use of compensatory strategies to determine ability to return to discharge diet (dyspahgia 1, pudding thick).  Swallow Study Goal #3 - Progress: Met  General Temperature Spikes Noted: No Respiratory Status: Supplemental O2 delivered via (comment) (2 L nasal cannula) Behavior/Cognition: Pleasant mood;Cooperative;Alert Oral Cavity - Dentition: Dentures, bottom;Dentures, top Patient Positioning: Upright in bed    Dysphagia Treatment Treatment focused on: Skilled observation of diet tolerance;Patient/family/caregiver education;Utilization of compensatory strategies Family/Caregiver Educated: sister Treatment Methods/Modalities: Skilled observation;Differential diagnosis Patient observed directly with PO's: Yes Type of PO's observed: Dysphagia 1 (puree);Thin liquids Feeding: Able to feed self;Needs assist Liquids provided via: Cup Oral Phase Signs & Symptoms: Anterior loss/spillage Pharyngeal Phase Signs & Symptoms: Immediate cough (with thin liquids only) Type of cueing: Verbal Amount of cueing: Moderate   GO    Ferdinand Lango MA, CCC-SLP [phone]  Ferdinand Lango Meryl 01/09/2012, 9:26 AM

## 2012-01-09 NOTE — Progress Notes (Signed)
Clinical Social Work:  Following for return to SNF with hospice when medically stable at [facility].  Unclear of anticipated dc plan, but per handoff, plans in place for patient to return with MOST form to be completed at DC.  Will facilitate DC plan once patient medically stable.   Will continue to follow.  Ashley Jacobs, MSW LCSW [phone]  For Genelle Bal.

## 2012-01-09 NOTE — Progress Notes (Signed)
Subjective: Patient seen, slept well last night after getting po morphine. Daughter at bedside.  Filed Vitals:   01/08/12 2100  BP: 127/68  Pulse: 95  Temp: 98.7 F (37.1 C)  Resp: 24    Chest: Clear Bilaterally Heart : S1S2 RRR Abdomen: Soft, nontender Ext : No edema Neuro: Alert, oriented x 3, but lacks insight and does not have decision making capacity  A/P Chronic pain Dyspnea  Discussed with daughter in detail, who does not want to prolong her mother's suffering.She does not want her to be on anticoagulation when she goes to SNF. Patient does not want to come back to hospital if she falls sick again.\ MOST form filled with daughter. Out of facility DNR form filled. Patient can return to SNF with palliative care follow up     Meredeth Ide Palliative Medicine Team [phone]

## 2012-01-09 NOTE — Progress Notes (Signed)
ANTICOAGULATION CONSULT NOTE   Pharmacy Consult for Heparin Indication: pulmonary embolus  Labs:  Basename 01/09/12 0512 01/08/12 0600 01/07/12 0624  HGB 9.5* 9.4* --  HCT 30.5* 30.8* 29.4*  PLT 204 211 226  APTT -- -- --  LABPROT -- -- --  INR -- -- --  HEPARINUNFRC 0.39 0.42 0.63  CREATININE 1.20* 0.99 1.09  CKTOTAL -- -- --  CKMB -- -- --  TROPONINI -- -- --   Estimated Creatinine Clearance: 36.7 ml/min (by C-G formula based on Cr of 1.2).  Assessment: 78 yo female now on IV heparin for PE .  Heparin drip rate 1250 uts/hr with therapeutic heparin level of 0.39.  No bleeding noted.  Platelet count stable.   Noted plans for SNF with hospice support.  Goal of Therapy:  Heparin level 0.3-0.7 units/ml Monitor platelets by anticoagulation protocol: Yes   Plan:  1) Continue heparin at 1250 units/hr 2) Continue daily CBC and heparin levels 3)  F/U plans for oral anticoagulation   Nadara Mustard, PharmD., MS Clinical Pharmacist Pager:  [phone]  Thank you for allowing pharmacy to be part of this patients care team.

## 2012-01-09 NOTE — Progress Notes (Signed)
This is a follow-up visit following pt's meeting with Dr. Sharl Ma.  Pt's daughter is feeling like she is being heard now and is hopeful that the plan they developed will work.  Pt is worried about transition and about her home and car.  I offered support and her daughter reassured her that all is being taken care of.  Pt is not used to being dependent.   I will continue to follow. [name]  [phone] personal pager

## 2012-01-10 DIAGNOSIS — J069 Acute upper respiratory infection, unspecified: Secondary | ICD-10-CM | POA: Diagnosis not present

## 2012-01-10 DIAGNOSIS — M5137 Other intervertebral disc degeneration, lumbosacral region: Secondary | ICD-10-CM | POA: Diagnosis not present

## 2012-01-10 DIAGNOSIS — M25559 Pain in unspecified hip: Secondary | ICD-10-CM | POA: Diagnosis not present

## 2012-01-10 DIAGNOSIS — IMO0002 Reserved for concepts with insufficient information to code with codable children: Secondary | ICD-10-CM | POA: Diagnosis not present

## 2012-01-10 DIAGNOSIS — J9610 Chronic respiratory failure, unspecified whether with hypoxia or hypercapnia: Secondary | ICD-10-CM | POA: Diagnosis not present

## 2012-01-10 DIAGNOSIS — S52599A Other fractures of lower end of unspecified radius, initial encounter for closed fracture: Secondary | ICD-10-CM | POA: Diagnosis not present

## 2012-01-10 DIAGNOSIS — I2699 Other pulmonary embolism without acute cor pulmonale: Secondary | ICD-10-CM | POA: Diagnosis not present

## 2012-01-10 DIAGNOSIS — F329 Major depressive disorder, single episode, unspecified: Secondary | ICD-10-CM | POA: Diagnosis not present

## 2012-01-10 DIAGNOSIS — I509 Heart failure, unspecified: Secondary | ICD-10-CM | POA: Diagnosis not present

## 2012-01-10 DIAGNOSIS — S81009A Unspecified open wound, unspecified knee, initial encounter: Secondary | ICD-10-CM | POA: Diagnosis not present

## 2012-01-10 DIAGNOSIS — Z86718 Personal history of other venous thrombosis and embolism: Secondary | ICD-10-CM | POA: Diagnosis not present

## 2012-01-10 DIAGNOSIS — R627 Adult failure to thrive: Secondary | ICD-10-CM | POA: Diagnosis not present

## 2012-01-10 DIAGNOSIS — E039 Hypothyroidism, unspecified: Secondary | ICD-10-CM | POA: Diagnosis not present

## 2012-01-10 DIAGNOSIS — R1319 Other dysphagia: Secondary | ICD-10-CM | POA: Diagnosis not present

## 2012-01-10 DIAGNOSIS — Z5189 Encounter for other specified aftercare: Secondary | ICD-10-CM | POA: Diagnosis not present

## 2012-01-10 DIAGNOSIS — Z79899 Other long term (current) drug therapy: Secondary | ICD-10-CM | POA: Diagnosis not present

## 2012-01-10 DIAGNOSIS — E876 Hypokalemia: Secondary | ICD-10-CM | POA: Diagnosis not present

## 2012-01-10 DIAGNOSIS — R0602 Shortness of breath: Secondary | ICD-10-CM | POA: Diagnosis not present

## 2012-01-10 DIAGNOSIS — K5900 Constipation, unspecified: Secondary | ICD-10-CM | POA: Diagnosis not present

## 2012-01-10 DIAGNOSIS — Z515 Encounter for palliative care: Secondary | ICD-10-CM | POA: Diagnosis not present

## 2012-01-10 DIAGNOSIS — L97809 Non-pressure chronic ulcer of other part of unspecified lower leg with unspecified severity: Secondary | ICD-10-CM | POA: Diagnosis not present

## 2012-01-10 DIAGNOSIS — I10 Essential (primary) hypertension: Secondary | ICD-10-CM | POA: Diagnosis not present

## 2012-01-10 DIAGNOSIS — D638 Anemia in other chronic diseases classified elsewhere: Secondary | ICD-10-CM | POA: Diagnosis not present

## 2012-01-10 DIAGNOSIS — I2510 Atherosclerotic heart disease of native coronary artery without angina pectoris: Secondary | ICD-10-CM | POA: Diagnosis not present

## 2012-01-10 DIAGNOSIS — G890 Central pain syndrome: Secondary | ICD-10-CM | POA: Diagnosis not present

## 2012-01-10 DIAGNOSIS — I739 Peripheral vascular disease, unspecified: Secondary | ICD-10-CM | POA: Diagnosis not present

## 2012-01-10 DIAGNOSIS — K219 Gastro-esophageal reflux disease without esophagitis: Secondary | ICD-10-CM | POA: Diagnosis not present

## 2012-01-10 DIAGNOSIS — R6889 Other general symptoms and signs: Secondary | ICD-10-CM | POA: Diagnosis not present

## 2012-01-10 DIAGNOSIS — G619 Inflammatory polyneuropathy, unspecified: Secondary | ICD-10-CM | POA: Diagnosis not present

## 2012-01-10 DIAGNOSIS — S52539B Colles' fracture of unspecified radius, initial encounter for open fracture type I or II: Secondary | ICD-10-CM | POA: Diagnosis not present

## 2012-01-10 DIAGNOSIS — S8263XA Displaced fracture of lateral malleolus of unspecified fibula, initial encounter for closed fracture: Secondary | ICD-10-CM | POA: Diagnosis not present

## 2012-01-10 DIAGNOSIS — J9600 Acute respiratory failure, unspecified whether with hypoxia or hypercapnia: Secondary | ICD-10-CM | POA: Diagnosis not present

## 2012-01-10 DIAGNOSIS — IMO0001 Reserved for inherently not codable concepts without codable children: Secondary | ICD-10-CM | POA: Diagnosis not present

## 2012-01-10 DIAGNOSIS — S92309A Fracture of unspecified metatarsal bone(s), unspecified foot, initial encounter for closed fracture: Secondary | ICD-10-CM | POA: Diagnosis not present

## 2012-01-10 LAB — CBC
HCT: 30.5 % — ABNORMAL LOW (ref 36.0–46.0)
Hemoglobin: 9.6 g/dL — ABNORMAL LOW (ref 12.0–15.0)
MCH: 32.3 pg (ref 26.0–34.0)
RBC: 2.97 MIL/uL — ABNORMAL LOW (ref 3.87–5.11)

## 2012-01-10 LAB — BASIC METABOLIC PANEL
BUN: 24 mg/dL — ABNORMAL HIGH (ref 6–23)
CO2: 32 mEq/L (ref 19–32)
Glucose, Bld: 105 mg/dL — ABNORMAL HIGH (ref 70–99)
Potassium: 2.9 mEq/L — ABNORMAL LOW (ref 3.5–5.1)
Sodium: 142 mEq/L (ref 135–145)

## 2012-01-10 LAB — HEPARIN LEVEL (UNFRACTIONATED): Heparin Unfractionated: 0.59 IU/mL (ref 0.30–0.70)

## 2012-01-10 MED ORDER — ISOSORBIDE DINITRATE 10 MG PO TABS
10.0000 mg | ORAL_TABLET | Freq: Three times a day (TID) | ORAL | Status: DC
  Administered 2012-01-10 (×2): 10 mg via ORAL
  Filled 2012-01-10 (×4): qty 1

## 2012-01-10 MED ORDER — FUROSEMIDE 20 MG PO TABS: 60.0000 mg | ORAL_TABLET | Freq: Two times a day (BID) | ORAL | Status: DC

## 2012-01-10 MED ORDER — LORAZEPAM 0.5 MG PO TABS: 1.0000 mg | ORAL_TABLET | Freq: Four times a day (QID) | ORAL | Status: DC | PRN

## 2012-01-10 MED ORDER — MORPHINE SULFATE (CONCENTRATE) 20 MG/ML PO SOLN: 5.0000 mg | ORAL | Status: DC | PRN

## 2012-01-10 MED ORDER — SCOPOLAMINE 1 MG/3DAYS TD PT72: 1.0000 | MEDICATED_PATCH | TRANSDERMAL | Status: DC

## 2012-01-10 MED ORDER — MORPHINE SULFATE ER 15 MG PO TBCR: 15.0000 mg | EXTENDED_RELEASE_TABLET | Freq: Every day | ORAL | Status: DC
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
## 2012-01-10 NOTE — Progress Notes (Addendum)
ANTICOAGULATION CONSULT NOTE   Pharmacy Consult for Heparin Indication: pulmonary embolus  Labs:  Basename 01/10/12 0540 01/09/12 0512 01/08/12 0600  HGB 9.6* 9.5* --  HCT 30.5* 30.5* 30.8*  PLT 187 204 211  HEPARINUNFRC 0.59 0.39 0.42  CREATININE 0.98 1.20* 0.99  Estimated Creatinine Clearance: 44.1 ml/min (by C-G formula based on Cr of 0.98).  Assessment: 78 yo female now on IV heparin for PE .  Heparin drip rate 1250 uts/hr with therapeutic heparin level of 0.59.   Noted plans not to resume Warfarin therapy for this patient as she transitions to SNF with hospice care.  Her CBC is stable this morning and she has no bleeding complications noted.    OF note:  Other labs reviewed and she has had consistently low potassium - most likely due to Furosemide therapy.  Could we supplement with oral liquid?  Goal of Therapy:  Heparin level 0.3-0.7 units/ml Monitor platelets by anticoagulation protocol: Yes   Plan:  1) Continue heparin at 1250 units/hr 2) Continue daily CBC and heparin levels  Nadara Mustard, PharmD., MS Clinical Pharmacist Pager:  [phone]  Thank you for allowing pharmacy to be part of this patients care team.

## 2012-01-10 NOTE — Clinical Social Work Note (Signed)
Patient is being discharged to [facility] today. Patient's daughter decided to change facilities from [facility] to [facility] and there was an available bed. Daughter will complete admission paperwork.  Discharge information forwarded to facility. CSW facilitated transport for patient via ambulance.  Genelle Bal, MSW, LCSW [phone]

## 2012-01-10 NOTE — Progress Notes (Signed)
Gave report to [place] at [facility]. Pt discharging with foley catheter to SNF.

## 2012-01-13 DIAGNOSIS — I509 Heart failure, unspecified: Secondary | ICD-10-CM | POA: Diagnosis not present

## 2012-01-13 DIAGNOSIS — G890 Central pain syndrome: Secondary | ICD-10-CM | POA: Diagnosis not present

## 2012-01-13 DIAGNOSIS — E039 Hypothyroidism, unspecified: Secondary | ICD-10-CM | POA: Diagnosis not present

## 2012-01-13 DIAGNOSIS — Z515 Encounter for palliative care: Secondary | ICD-10-CM | POA: Diagnosis not present

## 2012-01-13 DIAGNOSIS — I2699 Other pulmonary embolism without acute cor pulmonale: Secondary | ICD-10-CM | POA: Diagnosis not present

## 2012-01-13 DIAGNOSIS — R1319 Other dysphagia: Secondary | ICD-10-CM | POA: Diagnosis not present

## 2012-01-16 DIAGNOSIS — S8263XA Displaced fracture of lateral malleolus of unspecified fibula, initial encounter for closed fracture: Secondary | ICD-10-CM | POA: Diagnosis not present

## 2012-01-16 DIAGNOSIS — S52539B Colles' fracture of unspecified radius, initial encounter for open fracture type I or II: Secondary | ICD-10-CM | POA: Diagnosis not present

## 2012-01-17 DIAGNOSIS — R6889 Other general symptoms and signs: Secondary | ICD-10-CM | POA: Diagnosis not present

## 2012-01-23 ENCOUNTER — Encounter (HOSPITAL_BASED_OUTPATIENT_CLINIC_OR_DEPARTMENT_OTHER): Payer: Medicare Other | Attending: Plastic Surgery

## 2012-01-23 DIAGNOSIS — E039 Hypothyroidism, unspecified: Secondary | ICD-10-CM | POA: Insufficient documentation

## 2012-01-23 DIAGNOSIS — I10 Essential (primary) hypertension: Secondary | ICD-10-CM | POA: Diagnosis not present

## 2012-01-23 DIAGNOSIS — L97809 Non-pressure chronic ulcer of other part of unspecified lower leg with unspecified severity: Secondary | ICD-10-CM | POA: Insufficient documentation

## 2012-01-23 DIAGNOSIS — Z86718 Personal history of other venous thrombosis and embolism: Secondary | ICD-10-CM | POA: Diagnosis not present

## 2012-01-23 DIAGNOSIS — K219 Gastro-esophageal reflux disease without esophagitis: Secondary | ICD-10-CM | POA: Insufficient documentation

## 2012-01-23 DIAGNOSIS — Z79899 Other long term (current) drug therapy: Secondary | ICD-10-CM | POA: Insufficient documentation

## 2012-01-23 DIAGNOSIS — I739 Peripheral vascular disease, unspecified: Secondary | ICD-10-CM | POA: Insufficient documentation

## 2012-01-23 DIAGNOSIS — R627 Adult failure to thrive: Secondary | ICD-10-CM | POA: Diagnosis not present

## 2012-01-24 DIAGNOSIS — M25559 Pain in unspecified hip: Secondary | ICD-10-CM | POA: Diagnosis not present
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
## 2012-01-30 DIAGNOSIS — K219 Gastro-esophageal reflux disease without esophagitis: Secondary | ICD-10-CM | POA: Diagnosis not present

## 2012-01-30 DIAGNOSIS — L97809 Non-pressure chronic ulcer of other part of unspecified lower leg with unspecified severity: Secondary | ICD-10-CM | POA: Diagnosis not present

## 2012-01-30 DIAGNOSIS — E039 Hypothyroidism, unspecified: Secondary | ICD-10-CM | POA: Diagnosis not present

## 2012-01-30 DIAGNOSIS — I739 Peripheral vascular disease, unspecified: Secondary | ICD-10-CM | POA: Diagnosis not present

## 2012-01-30 DIAGNOSIS — Z86718 Personal history of other venous thrombosis and embolism: Secondary | ICD-10-CM | POA: Diagnosis not present

## 2012-01-30 DIAGNOSIS — I10 Essential (primary) hypertension: Secondary | ICD-10-CM | POA: Diagnosis not present

## 2012-02-01 DIAGNOSIS — S92309A Fracture of unspecified metatarsal bone(s), unspecified foot, initial encounter for closed fracture: Secondary | ICD-10-CM | POA: Diagnosis not present

## 2012-02-01 DIAGNOSIS — S8263XA Displaced fracture of lateral malleolus of unspecified fibula, initial encounter for closed fracture: Secondary | ICD-10-CM | POA: Diagnosis not present

## 2012-02-01 DIAGNOSIS — S52539B Colles' fracture of unspecified radius, initial encounter for open fracture type I or II: Secondary | ICD-10-CM | POA: Diagnosis not present

## 2012-02-06 ENCOUNTER — Encounter (HOSPITAL_BASED_OUTPATIENT_CLINIC_OR_DEPARTMENT_OTHER): Payer: Medicare Other | Attending: Plastic Surgery

## 2012-02-06 DIAGNOSIS — L97809 Non-pressure chronic ulcer of other part of unspecified lower leg with unspecified severity: Secondary | ICD-10-CM | POA: Insufficient documentation
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
## 2012-02-09 DIAGNOSIS — G622 Polyneuropathy due to other toxic agents: Secondary | ICD-10-CM | POA: Diagnosis not present

## 2012-02-09 DIAGNOSIS — G619 Inflammatory polyneuropathy, unspecified: Secondary | ICD-10-CM | POA: Diagnosis not present

## 2012-02-13 DIAGNOSIS — L97809 Non-pressure chronic ulcer of other part of unspecified lower leg with unspecified severity: Secondary | ICD-10-CM | POA: Diagnosis not present

## 2012-02-15 DIAGNOSIS — S8263XA Displaced fracture of lateral malleolus of unspecified fibula, initial encounter for closed fracture: Secondary | ICD-10-CM | POA: Diagnosis not present

## 2012-02-15 DIAGNOSIS — E039 Hypothyroidism, unspecified: Secondary | ICD-10-CM | POA: Diagnosis not present

## 2012-02-15 DIAGNOSIS — S52539B Colles' fracture of unspecified radius, initial encounter for open fracture type I or II: Secondary | ICD-10-CM | POA: Diagnosis not present

## 2012-02-15 DIAGNOSIS — G890 Central pain syndrome: Secondary | ICD-10-CM | POA: Diagnosis not present

## 2012-02-15 DIAGNOSIS — I2699 Other pulmonary embolism without acute cor pulmonale: Secondary | ICD-10-CM | POA: Diagnosis not present

## 2012-02-15 DIAGNOSIS — R1319 Other dysphagia: Secondary | ICD-10-CM | POA: Diagnosis not present

## 2012-02-15 DIAGNOSIS — S92309A Fracture of unspecified metatarsal bone(s), unspecified foot, initial encounter for closed fracture: Secondary | ICD-10-CM | POA: Diagnosis not present

## 2012-02-15 DIAGNOSIS — I509 Heart failure, unspecified: Secondary | ICD-10-CM | POA: Diagnosis not present
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
## 2012-02-16 ENCOUNTER — Encounter (HOSPITAL_BASED_OUTPATIENT_CLINIC_OR_DEPARTMENT_OTHER): Payer: Self-pay | Admitting: *Deleted

## 2012-02-20 ENCOUNTER — Other Ambulatory Visit: Payer: Self-pay | Admitting: Plastic Surgery

## 2012-02-20 ENCOUNTER — Encounter (HOSPITAL_BASED_OUTPATIENT_CLINIC_OR_DEPARTMENT_OTHER): Payer: Self-pay | Admitting: Anesthesiology
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
## 2012-02-20 NOTE — Progress Notes (Signed)
NOTED PT EXTENSIVE HX INCLUDING RECENT DX PLEURAL EFFUSIONS/ CHF/ AND PE. REVIEWED CHART W/ DR Charlotte Sanes MDA AND HE CONSULTED HIS PARTNERS. THEY AGREED PT SHOULD BE DONE AT MAIN OR. CALLED DR Kelly Splinter OFFICE LM W/ VINCEY , OR SCHEDULER.

## 2012-02-22 DIAGNOSIS — F329 Major depressive disorder, single episode, unspecified: Secondary | ICD-10-CM | POA: Diagnosis not present

## 2012-02-23 ENCOUNTER — Ambulatory Visit (HOSPITAL_BASED_OUTPATIENT_CLINIC_OR_DEPARTMENT_OTHER): Admission: RE | Admit: 2012-02-23 | Payer: Medicare Other | Source: Ambulatory Visit | Admitting: Plastic Surgery

## 2012-02-23 ENCOUNTER — Encounter (HOSPITAL_BASED_OUTPATIENT_CLINIC_OR_DEPARTMENT_OTHER): Admission: RE | Payer: Self-pay | Source: Ambulatory Visit

## 2012-02-23 HISTORY — DX: Personal history of other diseases of the digestive system: Z87.19

## 2012-02-23 SURGERY — IRRIGATION AND DEBRIDEMENT EXTREMITY
Anesthesia: General | Laterality: Bilateral

## 2012-02-27 ENCOUNTER — Encounter (HOSPITAL_BASED_OUTPATIENT_CLINIC_OR_DEPARTMENT_OTHER): Payer: Medicare Other

## 2012-02-27 DIAGNOSIS — L97809 Non-pressure chronic ulcer of other part of unspecified lower leg with unspecified severity: Secondary | ICD-10-CM | POA: Diagnosis not present

## 2012-02-28 DIAGNOSIS — R627 Adult failure to thrive: Secondary | ICD-10-CM | POA: Diagnosis not present
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
## 2012-03-04 DIAGNOSIS — I2510 Atherosclerotic heart disease of native coronary artery without angina pectoris: Secondary | ICD-10-CM | POA: Diagnosis not present

## 2012-03-04 DIAGNOSIS — S8263XA Displaced fracture of lateral malleolus of unspecified fibula, initial encounter for closed fracture: Secondary | ICD-10-CM | POA: Diagnosis not present

## 2012-03-04 DIAGNOSIS — S52539B Colles' fracture of unspecified radius, initial encounter for open fracture type I or II: Secondary | ICD-10-CM | POA: Diagnosis not present

## 2012-03-04 DIAGNOSIS — J9600 Acute respiratory failure, unspecified whether with hypoxia or hypercapnia: Secondary | ICD-10-CM | POA: Diagnosis not present

## 2012-03-04 DIAGNOSIS — IMO0002 Reserved for concepts with insufficient information to code with codable children: Secondary | ICD-10-CM | POA: Diagnosis not present

## 2012-03-04 DIAGNOSIS — I509 Heart failure, unspecified: Secondary | ICD-10-CM | POA: Diagnosis not present

## 2012-03-04 DIAGNOSIS — M5137 Other intervertebral disc degeneration, lumbosacral region: Secondary | ICD-10-CM | POA: Diagnosis not present

## 2012-03-04 DIAGNOSIS — S81009A Unspecified open wound, unspecified knee, initial encounter: Secondary | ICD-10-CM | POA: Diagnosis not present

## 2012-03-04 DIAGNOSIS — E876 Hypokalemia: Secondary | ICD-10-CM | POA: Diagnosis not present

## 2012-03-04 DIAGNOSIS — J069 Acute upper respiratory infection, unspecified: Secondary | ICD-10-CM | POA: Diagnosis not present

## 2012-03-04 DIAGNOSIS — IMO0001 Reserved for inherently not codable concepts without codable children: Secondary | ICD-10-CM | POA: Diagnosis not present

## 2012-03-04 DIAGNOSIS — R1319 Other dysphagia: Secondary | ICD-10-CM | POA: Diagnosis not present

## 2012-03-04 DIAGNOSIS — I10 Essential (primary) hypertension: Secondary | ICD-10-CM | POA: Diagnosis not present

## 2012-03-04 DIAGNOSIS — I2699 Other pulmonary embolism without acute cor pulmonale: Secondary | ICD-10-CM | POA: Diagnosis not present

## 2012-03-04 DIAGNOSIS — K219 Gastro-esophageal reflux disease without esophagitis: Secondary | ICD-10-CM | POA: Diagnosis not present

## 2012-03-04 DIAGNOSIS — J9610 Chronic respiratory failure, unspecified whether with hypoxia or hypercapnia: Secondary | ICD-10-CM | POA: Diagnosis not present

## 2012-03-04 DIAGNOSIS — S92309A Fracture of unspecified metatarsal bone(s), unspecified foot, initial encounter for closed fracture: Secondary | ICD-10-CM | POA: Diagnosis not present

## 2012-03-04 DIAGNOSIS — K5900 Constipation, unspecified: Secondary | ICD-10-CM | POA: Diagnosis not present

## 2012-03-04 DIAGNOSIS — S52599A Other fractures of lower end of unspecified radius, initial encounter for closed fracture: Secondary | ICD-10-CM | POA: Diagnosis not present

## 2012-03-04 DIAGNOSIS — L97809 Non-pressure chronic ulcer of other part of unspecified lower leg with unspecified severity: Secondary | ICD-10-CM | POA: Diagnosis not present

## 2012-03-04 DIAGNOSIS — D638 Anemia in other chronic diseases classified elsewhere: Secondary | ICD-10-CM | POA: Diagnosis not present

## 2012-03-04 DIAGNOSIS — Z5189 Encounter for other specified aftercare: Secondary | ICD-10-CM | POA: Diagnosis not present

## 2012-03-07 DIAGNOSIS — E876 Hypokalemia: Secondary | ICD-10-CM | POA: Diagnosis not present

## 2012-03-07 DIAGNOSIS — S81009A Unspecified open wound, unspecified knee, initial encounter: Secondary | ICD-10-CM | POA: Diagnosis not present

## 2012-03-14 DIAGNOSIS — S52539B Colles' fracture of unspecified radius, initial encounter for open fracture type I or II: Secondary | ICD-10-CM | POA: Diagnosis not present

## 2012-03-14 DIAGNOSIS — E876 Hypokalemia: Secondary | ICD-10-CM | POA: Diagnosis not present

## 2012-03-14 DIAGNOSIS — K5900 Constipation, unspecified: Secondary | ICD-10-CM | POA: Diagnosis not present

## 2012-03-14 DIAGNOSIS — S81009A Unspecified open wound, unspecified knee, initial encounter: Secondary | ICD-10-CM | POA: Diagnosis not present

## 2012-03-14 DIAGNOSIS — S92309A Fracture of unspecified metatarsal bone(s), unspecified foot, initial encounter for closed fracture: Secondary | ICD-10-CM | POA: Diagnosis not present

## 2012-03-14 DIAGNOSIS — S8263XA Displaced fracture of lateral malleolus of unspecified fibula, initial encounter for closed fracture: Secondary | ICD-10-CM | POA: Diagnosis not present

## 2012-03-19 ENCOUNTER — Encounter (HOSPITAL_BASED_OUTPATIENT_CLINIC_OR_DEPARTMENT_OTHER): Payer: Medicare Other | Attending: Plastic Surgery

## 2012-03-19 DIAGNOSIS — L97809 Non-pressure chronic ulcer of other part of unspecified lower leg with unspecified severity: Secondary | ICD-10-CM | POA: Insufficient documentation

## 2012-04-04 DIAGNOSIS — M5137 Other intervertebral disc degeneration, lumbosacral region: Secondary | ICD-10-CM | POA: Diagnosis not present

## 2012-04-09 ENCOUNTER — Encounter (HOSPITAL_BASED_OUTPATIENT_CLINIC_OR_DEPARTMENT_OTHER): Payer: Medicare Other | Attending: Plastic Surgery

## 2012-04-09 DIAGNOSIS — L97809 Non-pressure chronic ulcer of other part of unspecified lower leg with unspecified severity: Secondary | ICD-10-CM | POA: Diagnosis not present

## 2012-04-11 DIAGNOSIS — S52539B Colles' fracture of unspecified radius, initial encounter for open fracture type I or II: Secondary | ICD-10-CM | POA: Diagnosis not present

## 2012-04-11 DIAGNOSIS — S8263XA Displaced fracture of lateral malleolus of unspecified fibula, initial encounter for closed fracture: Secondary | ICD-10-CM | POA: Diagnosis not present

## 2012-04-11 DIAGNOSIS — IMO0001 Reserved for inherently not codable concepts without codable children: Secondary | ICD-10-CM | POA: Diagnosis not present

## 2012-04-11 DIAGNOSIS — J9610 Chronic respiratory failure, unspecified whether with hypoxia or hypercapnia: Secondary | ICD-10-CM | POA: Diagnosis not present

## 2012-04-11 DIAGNOSIS — I10 Essential (primary) hypertension: Secondary | ICD-10-CM | POA: Diagnosis not present

## 2012-04-11 DIAGNOSIS — I2510 Atherosclerotic heart disease of native coronary artery without angina pectoris: Secondary | ICD-10-CM | POA: Diagnosis not present

## 2012-04-11 DIAGNOSIS — R1319 Other dysphagia: Secondary | ICD-10-CM | POA: Diagnosis not present

## 2012-04-11 DIAGNOSIS — I509 Heart failure, unspecified: Secondary | ICD-10-CM | POA: Diagnosis not present

## 2012-04-11 DIAGNOSIS — S91009A Unspecified open wound, unspecified ankle, initial encounter: Secondary | ICD-10-CM | POA: Diagnosis not present

## 2012-04-13 DIAGNOSIS — J069 Acute upper respiratory infection, unspecified: Secondary | ICD-10-CM | POA: Diagnosis not present

## 2012-04-19 DIAGNOSIS — D631 Anemia in chronic kidney disease: Secondary | ICD-10-CM | POA: Diagnosis not present

## 2012-04-19 DIAGNOSIS — N039 Chronic nephritic syndrome with unspecified morphologic changes: Secondary | ICD-10-CM | POA: Diagnosis not present

## 2012-04-19 DIAGNOSIS — R1310 Dysphagia, unspecified: Secondary | ICD-10-CM | POA: Diagnosis not present

## 2012-04-19 DIAGNOSIS — Z4889 Encounter for other specified surgical aftercare: Secondary | ICD-10-CM | POA: Diagnosis not present

## 2012-04-19 DIAGNOSIS — I2699 Other pulmonary embolism without acute cor pulmonale: Secondary | ICD-10-CM | POA: Diagnosis not present

## 2012-04-19 DIAGNOSIS — R269 Unspecified abnormalities of gait and mobility: Secondary | ICD-10-CM | POA: Diagnosis not present

## 2012-04-20 DIAGNOSIS — I2699 Other pulmonary embolism without acute cor pulmonale: Secondary | ICD-10-CM | POA: Diagnosis not present

## 2012-04-20 DIAGNOSIS — R1310 Dysphagia, unspecified: Secondary | ICD-10-CM | POA: Diagnosis not present

## 2012-04-20 DIAGNOSIS — R269 Unspecified abnormalities of gait and mobility: Secondary | ICD-10-CM | POA: Diagnosis not present

## 2012-04-20 DIAGNOSIS — D631 Anemia in chronic kidney disease: Secondary | ICD-10-CM | POA: Diagnosis not present

## 2012-04-20 DIAGNOSIS — Z4889 Encounter for other specified surgical aftercare: Secondary | ICD-10-CM | POA: Diagnosis not present

## 2012-04-23 ENCOUNTER — Encounter (HOSPITAL_BASED_OUTPATIENT_CLINIC_OR_DEPARTMENT_OTHER): Payer: Medicare Other

## 2012-04-23 DIAGNOSIS — D631 Anemia in chronic kidney disease: Secondary | ICD-10-CM | POA: Diagnosis not present

## 2012-04-23 DIAGNOSIS — Z4889 Encounter for other specified surgical aftercare: Secondary | ICD-10-CM | POA: Diagnosis not present

## 2012-04-23 DIAGNOSIS — I2699 Other pulmonary embolism without acute cor pulmonale: Secondary | ICD-10-CM | POA: Diagnosis not present

## 2012-04-23 DIAGNOSIS — R1310 Dysphagia, unspecified: Secondary | ICD-10-CM | POA: Diagnosis not present

## 2012-04-23 DIAGNOSIS — R269 Unspecified abnormalities of gait and mobility: Secondary | ICD-10-CM | POA: Diagnosis not present

## 2012-04-24 DIAGNOSIS — Z4889 Encounter for other specified surgical aftercare: Secondary | ICD-10-CM | POA: Diagnosis not present

## 2012-04-24 DIAGNOSIS — R1310 Dysphagia, unspecified: Secondary | ICD-10-CM | POA: Diagnosis not present

## 2012-04-24 DIAGNOSIS — R269 Unspecified abnormalities of gait and mobility: Secondary | ICD-10-CM | POA: Diagnosis not present

## 2012-04-24 DIAGNOSIS — I2699 Other pulmonary embolism without acute cor pulmonale: Secondary | ICD-10-CM | POA: Diagnosis not present

## 2012-04-24 DIAGNOSIS — D631 Anemia in chronic kidney disease: Secondary | ICD-10-CM | POA: Diagnosis not present

## 2012-04-25 DIAGNOSIS — N039 Chronic nephritic syndrome with unspecified morphologic changes: Secondary | ICD-10-CM | POA: Diagnosis not present

## 2012-04-25 DIAGNOSIS — R269 Unspecified abnormalities of gait and mobility: Secondary | ICD-10-CM | POA: Diagnosis not present

## 2012-04-25 DIAGNOSIS — R1310 Dysphagia, unspecified: Secondary | ICD-10-CM | POA: Diagnosis not present

## 2012-04-25 DIAGNOSIS — Z4889 Encounter for other specified surgical aftercare: Secondary | ICD-10-CM | POA: Diagnosis not present

## 2012-04-25 DIAGNOSIS — I2699 Other pulmonary embolism without acute cor pulmonale: Secondary | ICD-10-CM | POA: Diagnosis not present

## 2012-04-30 DIAGNOSIS — Z4889 Encounter for other specified surgical aftercare: Secondary | ICD-10-CM | POA: Diagnosis not present

## 2012-04-30 DIAGNOSIS — R269 Unspecified abnormalities of gait and mobility: Secondary | ICD-10-CM | POA: Diagnosis not present

## 2012-04-30 DIAGNOSIS — N039 Chronic nephritic syndrome with unspecified morphologic changes: Secondary | ICD-10-CM | POA: Diagnosis not present

## 2012-04-30 DIAGNOSIS — L97809 Non-pressure chronic ulcer of other part of unspecified lower leg with unspecified severity: Secondary | ICD-10-CM | POA: Diagnosis not present

## 2012-04-30 DIAGNOSIS — I2699 Other pulmonary embolism without acute cor pulmonale: Secondary | ICD-10-CM | POA: Diagnosis not present

## 2012-04-30 DIAGNOSIS — R1310 Dysphagia, unspecified: Secondary | ICD-10-CM | POA: Diagnosis not present
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
## 2012-05-02 ENCOUNTER — Telehealth: Payer: Self-pay | Admitting: Pulmonary Disease
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
## 2012-05-03 ENCOUNTER — Telehealth: Payer: Self-pay | Admitting: Adult Health

## 2012-05-03 ENCOUNTER — Ambulatory Visit (INDEPENDENT_AMBULATORY_CARE_PROVIDER_SITE_OTHER): Payer: Medicare Other | Admitting: Adult Health

## 2012-05-03 ENCOUNTER — Other Ambulatory Visit (INDEPENDENT_AMBULATORY_CARE_PROVIDER_SITE_OTHER): Payer: Medicare Other

## 2012-05-03 ENCOUNTER — Encounter: Payer: Self-pay | Admitting: Adult Health

## 2012-05-03 VITALS — BP 126/76 | HR 71 | Temp 96.9°F | Ht 66.0 in | Wt 132.7 lb

## 2012-05-03 DIAGNOSIS — R197 Diarrhea, unspecified: Secondary | ICD-10-CM | POA: Diagnosis not present

## 2012-05-03 DIAGNOSIS — R269 Unspecified abnormalities of gait and mobility: Secondary | ICD-10-CM | POA: Diagnosis not present

## 2012-05-03 DIAGNOSIS — R300 Dysuria: Secondary | ICD-10-CM | POA: Diagnosis not present

## 2012-05-03 DIAGNOSIS — R1310 Dysphagia, unspecified: Secondary | ICD-10-CM | POA: Diagnosis not present

## 2012-05-03 DIAGNOSIS — N039 Chronic nephritic syndrome with unspecified morphologic changes: Secondary | ICD-10-CM | POA: Diagnosis not present

## 2012-05-03 DIAGNOSIS — K219 Gastro-esophageal reflux disease without esophagitis: Secondary | ICD-10-CM | POA: Diagnosis not present

## 2012-05-03 DIAGNOSIS — Z4889 Encounter for other specified surgical aftercare: Secondary | ICD-10-CM | POA: Diagnosis not present

## 2012-05-03 DIAGNOSIS — M1990 Unspecified osteoarthritis, unspecified site: Secondary | ICD-10-CM | POA: Diagnosis not present

## 2012-05-03 DIAGNOSIS — I2699 Other pulmonary embolism without acute cor pulmonale: Secondary | ICD-10-CM | POA: Diagnosis not present

## 2012-05-03 LAB — COMPREHENSIVE METABOLIC PANEL
Alkaline Phosphatase: 59 U/L (ref 39–117)
Glucose, Bld: 122 mg/dL — ABNORMAL HIGH (ref 70–99)
Sodium: 140 mEq/L (ref 135–145)
Total Bilirubin: 1.1 mg/dL (ref 0.3–1.2)
Total Protein: 7.8 g/dL (ref 6.0–8.3)

## 2012-05-03 LAB — CBC WITH DIFFERENTIAL/PLATELET
Basophils Relative: 1.5 % (ref 0.0–3.0)
Hemoglobin: 14 g/dL (ref 12.0–15.0)
Lymphocytes Relative: 32.1 % (ref 12.0–46.0)
Monocytes Relative: 8.1 % (ref 3.0–12.0)
Neutro Abs: 6.2 10*3/uL (ref 1.4–7.7)
Neutrophils Relative %: 57.2 % (ref 43.0–77.0)
RBC: 4.43 Mil/uL (ref 3.87–5.11)
WBC: 10.9 10*3/uL — ABNORMAL HIGH (ref 4.5–10.5)

## 2012-05-03 LAB — URINALYSIS, ROUTINE W REFLEX MICROSCOPIC
Nitrite: POSITIVE
Urobilinogen, UA: 0.2 (ref 0.0–1.0)

## 2012-05-03 LAB — TSH: TSH: 1.76 u[IU]/mL (ref 0.35–5.50)

## 2012-05-03 LAB — AMYLASE: Amylase: 30 U/L (ref 27–131)

## 2012-05-03 MED ORDER — HYDROCODONE-ACETAMINOPHEN 5-500 MG PO TABS: 1.0000 | ORAL_TABLET | Freq: Four times a day (QID) | ORAL | Status: DC | PRN

## 2012-05-03 MED ORDER — HYDROCODONE-ACETAMINOPHEN 5-500 MG PO TABS: 1.0000 | ORAL_TABLET | ORAL | Status: DC | PRN

## 2012-05-03 MED ORDER — RANITIDINE HCL 150 MG PO TABS: 150.0000 mg | ORAL_TABLET | Freq: Every day | ORAL | Status: DC

## 2012-05-03 NOTE — Patient Instructions (Addendum)
Stop Linzess.  Begin Align Probiotic daily  Begin Zantac 150mg  At bedtime   I will call with lab results  Once tests are back can decide on next step  May use Vicodin 1 every 6 hr As needed  Pain .  Stop Morphine  Please contact office for sooner follow up if symptoms do not improve or worsen or seek emergency care  Follow up in 2 weeks with Dr. Kriste Basque  Or Evelise Reine
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
## 2012-05-03 NOTE — Telephone Encounter (Signed)
LMOM TCB x1 for [name] > pt has ativan ordered prn (though MAR from independent living states that pt is to get this bid scheduled) > would like to verify how pt is getting this.  Also, TP ordered vicodin prn today at ov.  As pt lives in independent living and does not have someone checking on her routinely, how does she request her prn meds?  LMOM TCB x1 for [name] (pt daughter) > pt was given a sample cup for stool sample at ov today.  As soon as patient collects a sample, we need this brought to the lab asap. =============== [name] returned call.  Informed her of the above regarding pt's stool sample.  [name] verbalized her understanding and will bring this once provided by the patient.    Will forward to my inbasket for when [name] returns my call.

## 2012-05-04 DIAGNOSIS — Z4889 Encounter for other specified surgical aftercare: Secondary | ICD-10-CM | POA: Diagnosis not present

## 2012-05-04 DIAGNOSIS — I2699 Other pulmonary embolism without acute cor pulmonale: Secondary | ICD-10-CM | POA: Diagnosis not present

## 2012-05-04 DIAGNOSIS — R269 Unspecified abnormalities of gait and mobility: Secondary | ICD-10-CM | POA: Diagnosis not present

## 2012-05-04 DIAGNOSIS — R1310 Dysphagia, unspecified: Secondary | ICD-10-CM | POA: Diagnosis not present

## 2012-05-04 DIAGNOSIS — N039 Chronic nephritic syndrome with unspecified morphologic changes: Secondary | ICD-10-CM | POA: Diagnosis not present

## 2012-05-06 LAB — URINE CULTURE

## 2012-05-07 ENCOUNTER — Other Ambulatory Visit: Payer: Self-pay | Admitting: Adult Health

## 2012-05-07 DIAGNOSIS — R197 Diarrhea, unspecified: Secondary | ICD-10-CM | POA: Insufficient documentation

## 2012-05-07 MED ORDER — POTASSIUM CHLORIDE CRYS ER 20 MEQ PO TBCR: 20.0000 meq | EXTENDED_RELEASE_TABLET | Freq: Every day | ORAL | Status: DC

## 2012-05-07 MED ORDER — CIPROFLOXACIN HCL 250 MG PO TABS: 250.0000 mg | ORAL_TABLET | Freq: Two times a day (BID) | ORAL | Status: DC

## 2012-05-07 NOTE — Assessment & Plan Note (Signed)
Stop Linzess Previous on PPI  Will hold on PPI- add H2 w/ Zantac along w/ GERD diet

## 2012-05-07 NOTE — Progress Notes (Signed)
Quick Note:  LMOM TCB x1 for Jasmine December pt's daughter and [name] pt's nurse at [employer]. ______

## 2012-05-07 NOTE — Assessment & Plan Note (Addendum)
Diarrhea -? Etiology  Will check labs w/ C diff as pt at high risk w/ recent abx, critical illness w/ hospital admission SNF stay.  Advised to advance bland diet  Stop Linzess as this may be contributing +/- the cause .  Check labs w/ cmet.   Plan Stop Linzess.  Begin Align Probiotic daily  Begin Zantac 150mg  At bedtime   I will call with lab results  Once tests are back can decide on next step   Please contact office for sooner follow up if symptoms do not improve or worsen or seek emergency care  Follow up in 2 weeks with Dr. Kriste Basque  Or Josephyne Tarter

## 2012-05-07 NOTE — Telephone Encounter (Signed)
Rx's for the cipro and KDur have been printed and signed by TP; faxed to [facility] attn @ [phone].

## 2012-05-07 NOTE — Progress Notes (Signed)
Quick Note:  Pt's daughter [name] returned call - advised of pt's lab results / recs as stated by TP. [name] verbalized her understanding and denied any questions. She did also report that pt has not had any diarrhea since ov and the linzess was stopped; therefore unable to obtain stool specimen. TP is aware.  Will await Shola's return call to give order for KDur increase and cipro rx. ______

## 2012-05-07 NOTE — Telephone Encounter (Signed)
Have not heard back from [facility] and have left another message for her today regarding pt's lab results/recs per TP.  [facility], spoke with Alinda Money who will locate [name] now and have her call me.

## 2012-05-07 NOTE — Progress Notes (Signed)
Quick Note:  LMOM TCB x1 for [name] pt's daughter and Shola pt's nurse at [facility]. ______ 
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
## 2012-05-07 NOTE — Progress Notes (Signed)
Quick Note:  Pt's daughter [name] returned call - advised of pt's lab results / recs as stated by TP. [name] verbalized her understanding and denied any questions. She did also report that pt has not had any diarrhea since ov and the linzess was stopped; therefore unable to obtain stool specimen. TP is aware.  Will await Shola's return call to give order for KDur increase and cipro rx. ______ 

## 2012-05-07 NOTE — Progress Notes (Signed)
Quick Note:  Have yet to receive return call from pt's nurse [name] at [facility]. Orders only encounter created for cipro rx. Rx printed and orders written on fax cover sheet > faxed to [phone]. ______

## 2012-05-08 ENCOUNTER — Telehealth: Payer: Self-pay | Admitting: Pulmonary Disease

## 2012-05-08 DIAGNOSIS — Z4889 Encounter for other specified surgical aftercare: Secondary | ICD-10-CM | POA: Diagnosis not present

## 2012-05-08 DIAGNOSIS — R269 Unspecified abnormalities of gait and mobility: Secondary | ICD-10-CM | POA: Diagnosis not present

## 2012-05-08 DIAGNOSIS — D631 Anemia in chronic kidney disease: Secondary | ICD-10-CM | POA: Diagnosis not present

## 2012-05-08 DIAGNOSIS — R1310 Dysphagia, unspecified: Secondary | ICD-10-CM | POA: Diagnosis not present

## 2012-05-08 DIAGNOSIS — I2699 Other pulmonary embolism without acute cor pulmonale: Secondary | ICD-10-CM | POA: Diagnosis not present

## 2012-05-08 MED ORDER — LIDOCAINE 5 % EX PTCH: 1.0000 | MEDICATED_PATCH | CUTANEOUS | Status: DC

## 2012-05-08 NOTE — Telephone Encounter (Signed)
How much ativan is she getting  That is fine w/ me but she does not need to be over sedated  Can they not leave it w/ the pt.   immodium is fine, but let the pt have it in her apartment. [place].

## 2012-05-08 NOTE — Telephone Encounter (Signed)
Pt seen by TP 10.31.13 for diarrhea; was also given vicodin 5-500mg  q6h prn pain.  Pt's daughter Jasmine December called and stated that she received a phone call from pt's nurse at [facility] who stated that pt's lidoderm patches are expired and that she is in need of more.  Lidoderm patches are not on pt's MAR from [facility] (still in my possession) and there is no documentation that she ever got this in epic.  However, pt was in a SNF for approx 100 days before going to independent living at [facility].    Dr Kriste Basque please advise if pt may have lidoderm patches, thanks.  Allergies  Allergen Reactions  . Codeine     REACTION: itching  . Pregabalin     REACTION: causes nervousness

## 2012-05-08 NOTE — Telephone Encounter (Signed)
rx has been printed out and we will fax this rx to the facility.

## 2012-05-08 NOTE — Telephone Encounter (Signed)
Per SN---ok to have the lidoderm patches---1 every 12 hours prn pain---remove after 12 hours.  rx has been printed and will fax to nursing home.

## 2012-05-08 NOTE — Telephone Encounter (Signed)
Shola returned call again.  She stated that she did not receive the rx's that were faxed yesterday afternoon and requested that they be refaxed to [phone] (done).  [name] stated that while pt has not had any more watery diarrhea since ov, she did have a loose BM earlier today and is requesting order for imodium PRN for pt.  Would like this order faxed to the same fax number above.  Tammy please advise, thanks.  Also, per [name] pt is getting her ativan at 9am and 9pm scheduled, but will also add in a 12N dose since pt is reportedly needing this additional dose at that time.  Because pt lives in an independent living facility, pt/family will be charged additionally for a nurse to come to the room for PRN medications (the vicodin ordered at last ov).  [name] reported that she will speak with patient's daughter regarding this to see if this is amenable to pt's family.  Nothing further needed in that regard.

## 2012-05-08 NOTE — Telephone Encounter (Signed)
Returning [name]'s call.  [phone]

## 2012-05-09 ENCOUNTER — Telehealth: Payer: Self-pay | Admitting: Pulmonary Disease

## 2012-05-09 DIAGNOSIS — R269 Unspecified abnormalities of gait and mobility: Secondary | ICD-10-CM | POA: Diagnosis not present

## 2012-05-09 DIAGNOSIS — Z4889 Encounter for other specified surgical aftercare: Secondary | ICD-10-CM | POA: Diagnosis not present

## 2012-05-09 DIAGNOSIS — I2699 Other pulmonary embolism without acute cor pulmonale: Secondary | ICD-10-CM | POA: Diagnosis not present

## 2012-05-09 DIAGNOSIS — N039 Chronic nephritic syndrome with unspecified morphologic changes: Secondary | ICD-10-CM | POA: Diagnosis not present

## 2012-05-09 DIAGNOSIS — R1310 Dysphagia, unspecified: Secondary | ICD-10-CM | POA: Diagnosis not present

## 2012-05-09 NOTE — Telephone Encounter (Signed)
Per updated MAR faxed by [facility] on 11.5.13 pt is getting ativan 1mg  tabs at 7:30am, 12N, and 9pm.  Have LMOM TCB for [name] to see if the ativan and imodium can be left w/ patient in her apartment since she is in an independent living.  Order for imodium 2mg  q4h prn watery diarrhea faxed to [facility] at [phone].

## 2012-05-09 NOTE — Telephone Encounter (Signed)
As I was unable to reach Rolm Gala at given number I called Genevieve Norlander at [phone] and left approval for more PT on patient with Early. If any other questions or concerns they will have Rolm Gala call us back.

## 2012-05-10 MED ORDER — LOPERAMIDE HCL 2 MG PO CAPS: 2.0000 mg | ORAL_CAPSULE | ORAL | Status: DC | PRN

## 2012-05-10 MED ORDER — LORAZEPAM 1 MG PO TABS: 1.0000 mg | ORAL_TABLET | Freq: Three times a day (TID) | ORAL | Status: DC

## 2012-05-10 NOTE — Telephone Encounter (Signed)
Phone call from [facility] stating that they did not receive the Order for Imodium that was faxed to [phone]...will refax order now. I also explained to [facility] that patient is to remain on Ativan 1mg  tid...will send Rx. [name] stated that patient is already currently taking Ativan; explained that TP recs are for her to take it tid. Shola expressed understanding and seemed to be in a hurry to get off the phone and cut me short. I will print both Rx Orders and send to number listed.

## 2012-05-10 NOTE — Telephone Encounter (Signed)
Ativan 1 mg Three times a day  #90  No refills

## 2012-05-15 DIAGNOSIS — Z4889 Encounter for other specified surgical aftercare: Secondary | ICD-10-CM | POA: Diagnosis not present

## 2012-05-15 DIAGNOSIS — R1310 Dysphagia, unspecified: Secondary | ICD-10-CM | POA: Diagnosis not present

## 2012-05-15 DIAGNOSIS — N039 Chronic nephritic syndrome with unspecified morphologic changes: Secondary | ICD-10-CM | POA: Diagnosis not present

## 2012-05-15 DIAGNOSIS — I2699 Other pulmonary embolism without acute cor pulmonale: Secondary | ICD-10-CM | POA: Diagnosis not present

## 2012-05-15 DIAGNOSIS — R269 Unspecified abnormalities of gait and mobility: Secondary | ICD-10-CM | POA: Diagnosis not present

## 2012-05-17 ENCOUNTER — Ambulatory Visit: Payer: Medicare Other | Admitting: Adult Health

## 2012-05-17 ENCOUNTER — Telehealth: Payer: Self-pay | Admitting: Pulmonary Disease

## 2012-05-17 ENCOUNTER — Ambulatory Visit (INDEPENDENT_AMBULATORY_CARE_PROVIDER_SITE_OTHER): Payer: Medicare Other | Admitting: Adult Health

## 2012-05-17 ENCOUNTER — Encounter: Payer: Self-pay | Admitting: Adult Health

## 2012-05-17 ENCOUNTER — Other Ambulatory Visit (INDEPENDENT_AMBULATORY_CARE_PROVIDER_SITE_OTHER): Payer: Medicare Other

## 2012-05-17 VITALS — BP 106/66 | HR 72 | Temp 98.4°F | Ht 62.0 in | Wt 130.0 lb

## 2012-05-17 DIAGNOSIS — D631 Anemia in chronic kidney disease: Secondary | ICD-10-CM | POA: Diagnosis not present

## 2012-05-17 DIAGNOSIS — R197 Diarrhea, unspecified: Secondary | ICD-10-CM | POA: Diagnosis not present

## 2012-05-17 DIAGNOSIS — R269 Unspecified abnormalities of gait and mobility: Secondary | ICD-10-CM | POA: Diagnosis not present

## 2012-05-17 DIAGNOSIS — R1310 Dysphagia, unspecified: Secondary | ICD-10-CM | POA: Diagnosis not present

## 2012-05-17 DIAGNOSIS — Z4889 Encounter for other specified surgical aftercare: Secondary | ICD-10-CM | POA: Diagnosis not present

## 2012-05-17 DIAGNOSIS — I2699 Other pulmonary embolism without acute cor pulmonale: Secondary | ICD-10-CM | POA: Diagnosis not present

## 2012-05-17 DIAGNOSIS — N039 Chronic nephritic syndrome with unspecified morphologic changes: Secondary | ICD-10-CM | POA: Diagnosis not present

## 2012-05-17 LAB — BASIC METABOLIC PANEL
GFR: 58.01 mL/min — ABNORMAL LOW (ref >60.00)
Glucose, Bld: 120 mg/dL — ABNORMAL HIGH (ref 70–99)
Potassium: 3.6 mEq/L (ref 3.5–5.1)
Sodium: 140 mEq/L (ref 135–145)

## 2012-05-17 NOTE — Telephone Encounter (Signed)
I spoke with [name] and she stated pt advised her that TP stated "she is ready to go home now". She stated this is causing a ton of grief bc pt is not ready to go home. Per daughter she is having to pay for pt to be taking down to be feed 3 times a day, paying for someone to give her medications, and she can't do her own laundry. She stated pt is confused. Per daughter she asked that tp does not mention this again to pt. I spoke with TP and JJ regarding this. JJ will call daughter back.
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
## 2012-05-17 NOTE — Progress Notes (Signed)
Quick Note:  Pt's daughter Jasmine December is aware of lab results / recs as stated by TP. Jasmine December verbalized her understanding. ______

## 2012-05-17 NOTE — Assessment & Plan Note (Signed)
Diarrhea resolved off Linzess.

## 2012-05-17 NOTE — Patient Instructions (Addendum)
Continue on current regimen.  follow up Dr. Kriste Basque  In 1 month and As needed   Please contact office for sooner follow up if symptoms do not improve or worsen or seek emergency care

## 2012-05-17 NOTE — Telephone Encounter (Signed)
We did not tell pt that she was ready to go home.  That decision is not up to this dept.  Called spoke with Jasmine December and informed her of this and apologized for the misunderstanding.  Awilda Metro that it would be beneficial if she would accompany pt to her next ov > 12.23.13 @ 1130 w/ SN.  Jasmine December wrote this date/time down and stated that she will attempt to accompany pt.  Jasmine December requested pt to be called to discuss this with as well at [phone].  LMOM TCB x1 for pt.

## 2012-05-18 DIAGNOSIS — R269 Unspecified abnormalities of gait and mobility: Secondary | ICD-10-CM | POA: Diagnosis not present

## 2012-05-18 DIAGNOSIS — D631 Anemia in chronic kidney disease: Secondary | ICD-10-CM | POA: Diagnosis not present

## 2012-05-18 DIAGNOSIS — R1310 Dysphagia, unspecified: Secondary | ICD-10-CM | POA: Diagnosis not present

## 2012-05-18 DIAGNOSIS — I2699 Other pulmonary embolism without acute cor pulmonale: Secondary | ICD-10-CM | POA: Diagnosis not present

## 2012-05-18 DIAGNOSIS — Z4889 Encounter for other specified surgical aftercare: Secondary | ICD-10-CM | POA: Diagnosis not present

## 2012-05-18 NOTE — Addendum Note (Signed)
Addended by: Boone Master E on: 05/18/2012 09:06 AM   Modules accepted: Orders

## 2012-05-18 NOTE — Telephone Encounter (Signed)
LMOM TCB x2

## 2012-05-23 DIAGNOSIS — IMO0001 Reserved for inherently not codable concepts without codable children: Secondary | ICD-10-CM | POA: Diagnosis not present

## 2012-05-23 DIAGNOSIS — S52539B Colles' fracture of unspecified radius, initial encounter for open fracture type I or II: Secondary | ICD-10-CM | POA: Diagnosis not present

## 2012-05-23 DIAGNOSIS — S8263XA Displaced fracture of lateral malleolus of unspecified fibula, initial encounter for closed fracture: Secondary | ICD-10-CM | POA: Diagnosis not present

## 2012-05-24 DIAGNOSIS — R1310 Dysphagia, unspecified: Secondary | ICD-10-CM | POA: Diagnosis not present

## 2012-05-24 DIAGNOSIS — Z4889 Encounter for other specified surgical aftercare: Secondary | ICD-10-CM | POA: Diagnosis not present

## 2012-05-24 DIAGNOSIS — R269 Unspecified abnormalities of gait and mobility: Secondary | ICD-10-CM | POA: Diagnosis not present

## 2012-05-24 DIAGNOSIS — N039 Chronic nephritic syndrome with unspecified morphologic changes: Secondary | ICD-10-CM | POA: Diagnosis not present

## 2012-05-24 DIAGNOSIS — I2699 Other pulmonary embolism without acute cor pulmonale: Secondary | ICD-10-CM | POA: Diagnosis not present

## 2012-05-25 NOTE — Telephone Encounter (Signed)
Never heard back from patient.  Called spoke with patient's daughter Jasmine December and informed her of this.  Jasmine December stated this can be disregarded; Jasmine December stated she misinterpreted pt.  Nothing further needed at this time; will sign off.

## 2012-05-29 DIAGNOSIS — D631 Anemia in chronic kidney disease: Secondary | ICD-10-CM | POA: Diagnosis not present

## 2012-05-29 DIAGNOSIS — R269 Unspecified abnormalities of gait and mobility: Secondary | ICD-10-CM | POA: Diagnosis not present

## 2012-05-29 DIAGNOSIS — R1310 Dysphagia, unspecified: Secondary | ICD-10-CM | POA: Diagnosis not present

## 2012-05-29 DIAGNOSIS — I2699 Other pulmonary embolism without acute cor pulmonale: Secondary | ICD-10-CM | POA: Diagnosis not present

## 2012-05-29 DIAGNOSIS — Z4889 Encounter for other specified surgical aftercare: Secondary | ICD-10-CM | POA: Diagnosis not present

## 2012-05-30 DIAGNOSIS — Z4889 Encounter for other specified surgical aftercare: Secondary | ICD-10-CM | POA: Diagnosis not present

## 2012-05-30 DIAGNOSIS — R1310 Dysphagia, unspecified: Secondary | ICD-10-CM | POA: Diagnosis not present

## 2012-05-30 DIAGNOSIS — R269 Unspecified abnormalities of gait and mobility: Secondary | ICD-10-CM | POA: Diagnosis not present

## 2012-05-30 DIAGNOSIS — N039 Chronic nephritic syndrome with unspecified morphologic changes: Secondary | ICD-10-CM | POA: Diagnosis not present

## 2012-05-30 DIAGNOSIS — I2699 Other pulmonary embolism without acute cor pulmonale: Secondary | ICD-10-CM | POA: Diagnosis not present

## 2012-06-19 ENCOUNTER — Other Ambulatory Visit: Payer: Self-pay | Admitting: Pulmonary Disease

## 2012-06-20 MED ORDER — HYDROCODONE-ACETAMINOPHEN 5-500 MG PO TABS: 1.0000 | ORAL_TABLET | Freq: Four times a day (QID) | ORAL | Status: DC | PRN

## 2012-06-20 NOTE — Telephone Encounter (Signed)
rx has been faxed back to the pharmacy for refills.

## 2012-06-20 NOTE — Telephone Encounter (Signed)
rx printed for SN to sign.

## 2012-06-21 ENCOUNTER — Encounter: Payer: Self-pay | Admitting: Adult Health

## 2012-06-21 ENCOUNTER — Telehealth: Payer: Self-pay | Admitting: Pulmonary Disease

## 2012-06-21 ENCOUNTER — Ambulatory Visit (INDEPENDENT_AMBULATORY_CARE_PROVIDER_SITE_OTHER): Payer: Medicare Other | Admitting: Adult Health

## 2012-06-21 VITALS — BP 132/68 | HR 65 | Temp 97.0°F | Ht 66.0 in | Wt 135.2 lb

## 2012-06-21 DIAGNOSIS — I519 Heart disease, unspecified: Secondary | ICD-10-CM

## 2012-06-21 DIAGNOSIS — J42 Unspecified chronic bronchitis: Secondary | ICD-10-CM

## 2012-06-21 MED ORDER — CEFDINIR 300 MG PO CAPS: 300.0000 mg | ORAL_CAPSULE | Freq: Two times a day (BID) | ORAL | Status: DC

## 2012-06-21 MED ORDER — FUROSEMIDE 20 MG PO TABS: 40.0000 mg | ORAL_TABLET | Freq: Every day | ORAL | Status: DC

## 2012-06-21 NOTE — Addendum Note (Signed)
Addended by: Boone Master E on: 06/21/2012 12:55 PM   Modules accepted: Orders

## 2012-06-21 NOTE — Telephone Encounter (Signed)
[name] with [facility] wants clarification for Lasix since there is such a difference in the dosing. Pt has been taking 60 mg BID and the new dosage is 40 mg once a day. Pls advise if this is correct.
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
## 2012-06-21 NOTE — Assessment & Plan Note (Signed)
Mild Flare   Plan  Omnicef x  7 days  mucinex Twice daily  As needed   Fluids and rest  Please contact office for sooner follow up if symptoms do not improve or worsen or seek emergency care

## 2012-06-21 NOTE — Patient Instructions (Addendum)
Decrease Lasix 40mg  daily  Low salt diet  We are referring you to cardiology for follow up for CHF  Omnicef 300mg  Twice daily  For 7 days  Mucinex DM Twice daily  As needed  Cough/congesitoin  Fluids and rest  Please contact office for sooner follow up if symptoms do not improve or worsen or seek emergency care  follow up Dr. Kriste Basque  In 6 weeks.

## 2012-06-21 NOTE — Telephone Encounter (Signed)
Verified with TP.  This is the new dose per 12.19.13 ov w/ TP.    Called spoke with Mia, clarified this for her.  Nothing further needed.  Will sign off.

## 2012-06-22 ENCOUNTER — Telehealth: Payer: Self-pay | Admitting: Pulmonary Disease

## 2012-06-22 NOTE — Telephone Encounter (Signed)
Pt is scheduled to see Dr Vista Deck 1.16.14 @ 10:15am.  Called spoke with Jasmine December, who reported that [facility] only transports patients to appts on Mon, Wed, Fri.  Spoke with Bjorn Loser [facility] who will reschedule this appt and let me know so that I may call Jasmine December back.

## 2012-06-22 NOTE — Telephone Encounter (Signed)
Daughter called re: same. [name]

## 2012-06-22 NOTE — Telephone Encounter (Signed)
Appt changed to Monday 1.20.14 @ 1145.  Called spoke with Jasmine December, gave her appt date/time, also provided address and phone number for [facility].  Jasmine December ok with this appt date/time and verbalized her understanding.  Nothing further needed; will sign off.

## 2012-06-22 NOTE — Telephone Encounter (Signed)
I spoke with daughter and she stated she did not even know pt had CHF. Also another thing is she does not even feel like pt needs to see a cardiologists. She doesn't want pt to do "these test" pt needs to have done. I advised her I do not see where pt is scheduled to have any done. She states she wants to cancel the appt for cardiology. She states anytime we schedule anything for pt we need to contact her bc pt is confused. Please advise TP thanks

## 2012-06-22 NOTE — Telephone Encounter (Signed)
Pt daughter aware  Please call daughter w/ cards appointment.

## 2012-06-25 ENCOUNTER — Ambulatory Visit: Payer: Medicare Other | Admitting: Pulmonary Disease

## 2012-06-29 ENCOUNTER — Telehealth: Payer: Self-pay | Admitting: Pulmonary Disease

## 2012-06-29 NOTE — Telephone Encounter (Signed)
LMTCB x 1 for Mia.

## 2012-07-02 NOTE — Telephone Encounter (Signed)
Per Mia, the pt has had no more complaints since last week and will finish up the abx tomorrow. Nothing further is needed.

## 2012-07-19 ENCOUNTER — Encounter: Payer: Medicare Other | Admitting: Cardiology

## 2012-07-23 ENCOUNTER — Encounter: Payer: Medicare Other | Admitting: Cardiology

## 2012-07-26 ENCOUNTER — Telehealth: Payer: Self-pay | Admitting: Pulmonary Disease

## 2012-07-26 MED ORDER — LEVOTHYROXINE SODIUM 100 MCG PO TABS: 100.0000 ug | ORAL_TABLET | Freq: Every day | ORAL | Status: DC

## 2012-07-26 MED ORDER — LORAZEPAM 1 MG PO TABS: 1.0000 mg | ORAL_TABLET | Freq: Three times a day (TID) | ORAL | Status: DC

## 2012-07-26 MED ORDER — POTASSIUM CHLORIDE CRYS ER 20 MEQ PO TBCR: 20.0000 meq | EXTENDED_RELEASE_TABLET | Freq: Every day | ORAL | Status: DC

## 2012-07-26 MED ORDER — RANITIDINE HCL 150 MG PO TABS: 150.0000 mg | ORAL_TABLET | Freq: Every day | ORAL | Status: DC

## 2012-07-26 MED ORDER — LIDOCAINE 5 % EX PTCH: 1.0000 | MEDICATED_PATCH | CUTANEOUS | Status: DC

## 2012-07-26 MED ORDER — FUROSEMIDE 20 MG PO TABS: 40.0000 mg | ORAL_TABLET | Freq: Every day | ORAL | Status: DC

## 2012-07-26 NOTE — Telephone Encounter (Signed)
I spoke with daughter. She stated pt is currently in [place] and will be there for a while. Not sure when pt will be back.  She is requesting that we send ALL of pt's medications there for 90 day supply to [facility]. She wants to add medication for RLS and imipramine 25 mg. She stated pt was on this previously but [facility] stopped giving this to pt. Please advise SN of this is okay to do so. thanks

## 2012-07-26 NOTE — Telephone Encounter (Signed)
rx have been printed out for SN to sign.   Waiting for approval of the RLS medication and the refill of the imipramine.

## 2012-07-27 MED ORDER — IMIPRAMINE HCL 25 MG PO TABS: 25.0000 mg | ORAL_TABLET | Freq: Every day | ORAL | Status: DC

## 2012-07-27 MED ORDER — DICLOFENAC SODIUM 1 % TD GEL: TRANSDERMAL | Status: DC

## 2012-07-27 NOTE — Telephone Encounter (Signed)
rx have been printed and signed and faxed to the pharmacy request.  SN stated that the pt can try the imipramine 25 mg at bedtime to see if this helps with her RLS as well.  thanks

## 2012-07-27 NOTE — Telephone Encounter (Signed)
rx have been faxed to the pharmacy request.  i called and lmom for the pts daughter to make her aware.

## 2012-07-27 NOTE — Telephone Encounter (Signed)
Spoke with patients daughter, informed her of meds that were faxed to requested pharmacy. Daughter is now also requesting rx for Voltaren Gel 1% states patient "really misses rubbing this on her legs." Dr. Kriste Basque can this be faxed as well? Please advise, thank you

## 2012-08-01 ENCOUNTER — Ambulatory Visit: Payer: Medicare Other | Admitting: Pulmonary Disease

## 2012-08-01 ENCOUNTER — Other Ambulatory Visit: Payer: Self-pay | Admitting: Pulmonary Disease

## 2012-08-01 MED ORDER — HYDROCODONE-ACETAMINOPHEN 5-325 MG PO TABS: ORAL_TABLET | ORAL | Status: DC

## 2012-08-01 NOTE — Telephone Encounter (Signed)
rx printed and will be faxed to [facility] where the pt is currently stayin in [place].  Fax number is [phone].

## 2012-08-03 ENCOUNTER — Telehealth: Payer: Self-pay | Admitting: Pulmonary Disease

## 2012-08-03 MED ORDER — DICLOFENAC SODIUM 1 % TD GEL: TRANSDERMAL | Status: DC

## 2012-08-03 MED ORDER — FLUTICASONE-SALMETEROL 250-50 MCG/DOSE IN AEPB: 1.0000 | INHALATION_SPRAY | Freq: Two times a day (BID) | RESPIRATORY_TRACT | Status: DC

## 2012-08-03 NOTE — Telephone Encounter (Signed)
rx have been singed by SN and faxed to the pharmacy per pts daughters request. Daughter is aware. Nothing further is needed.

## 2012-08-03 NOTE — Telephone Encounter (Signed)
Printed out rx again and will have SN sign these and fax them back to the pharmacy that the pt is using out of town.

## 2012-08-13 ENCOUNTER — Telehealth: Payer: Self-pay | Admitting: Pulmonary Disease

## 2012-08-13 NOTE — Telephone Encounter (Signed)
Daughter [name] returned call. Hazel Sams

## 2012-08-13 NOTE — Telephone Encounter (Signed)
lmomtcb x1 for sharon 

## 2012-08-13 NOTE — Telephone Encounter (Signed)
Patient daughter called requesting that we refax Rx that they requested at patients last OV. Below is a list of the medications needed to be printed and faxed to [facility] Fax # [phone].  Pt is in [place] with daughter and needs these filled, when they saw TP 06/2012 medications were printed and faxed up to [facility] and pts daughter states that these medications were not received. Patient has no upcoming appt--has cancelled f/u appts(07/31/12) with SN d/t being in [place].    Isosorbide 30mg  1 qd  #30 Metoprolol  25mg  1 qd  #30 Gabapentin 100mg   #30 Plavix 75mg  1qd   #30 Align #30 MV  #30 B50 complex  #30  Please advise Dr Kriste Basque if these medication are okay to be refilled and how many refills. Thanks.  These will need to be printed once okayed and faxed to number above.

## 2012-08-14 ENCOUNTER — Telehealth: Payer: Self-pay | Admitting: Pulmonary Disease

## 2012-08-14 MED ORDER — ALIGN 4 MG PO CAPS: 1.0000 | ORAL_CAPSULE | Freq: Every day | ORAL | Status: DC

## 2012-08-14 MED ORDER — METOPROLOL TARTRATE 25 MG PO TABS: 25.0000 mg | ORAL_TABLET | Freq: Two times a day (BID) | ORAL | Status: DC

## 2012-08-14 MED ORDER — ONE-DAILY MULTI VITAMINS PO TABS: 1.0000 | ORAL_TABLET | Freq: Every day | ORAL | Status: DC

## 2012-08-14 MED ORDER — GABAPENTIN 100 MG PO CAPS: 100.0000 mg | ORAL_CAPSULE | Freq: Every day | ORAL | Status: DC

## 2012-08-14 MED ORDER — ISOSORBIDE MONONITRATE ER 30 MG PO TB24: 30.0000 mg | ORAL_TABLET | Freq: Every day | ORAL | Status: DC

## 2012-08-14 MED ORDER — B COMPLEX PO TABS: 1.0000 | ORAL_TABLET | Freq: Every day | ORAL | Status: DC

## 2012-08-14 NOTE — Telephone Encounter (Addendum)
Per SN- ok to resend rx but we also the pt needs to establish with a doctor in [place] wihtin the next month. We need to know the name of the doctor so we can send records. Carron Curie, CMA

## 2012-08-14 NOTE — Telephone Encounter (Signed)
Pharmacy notified that we do not show the Plavix as a current medication. I also told the daughter this ealier when speaking with her. They will need to figure out who has been writing for the Plavix and request the RX for the Plavix.

## 2012-08-14 NOTE — Telephone Encounter (Signed)
Prescriptions have been resent to the pharmacy in [place]. Perr daughter, the pt will be back in [place] within the month. She is only up [place] because the daughter herself is having surgery and cannot take care of her.

## 2012-08-27 DIAGNOSIS — L089 Local infection of the skin and subcutaneous tissue, unspecified: Secondary | ICD-10-CM | POA: Diagnosis not present

## 2012-08-27 DIAGNOSIS — S8990XA Unspecified injury of unspecified lower leg, initial encounter: Secondary | ICD-10-CM | POA: Diagnosis not present

## 2012-08-27 DIAGNOSIS — S81009A Unspecified open wound, unspecified knee, initial encounter: Secondary | ICD-10-CM | POA: Diagnosis not present

## 2012-08-27 DIAGNOSIS — I872 Venous insufficiency (chronic) (peripheral): Secondary | ICD-10-CM | POA: Diagnosis not present

## 2012-09-03 DIAGNOSIS — A4902 Methicillin resistant Staphylococcus aureus infection, unspecified site: Secondary | ICD-10-CM | POA: Diagnosis not present

## 2012-09-06 ENCOUNTER — Telehealth: Payer: Self-pay | Admitting: Pulmonary Disease

## 2012-09-06 MED ORDER — METOPROLOL TARTRATE 25 MG PO TABS: 25.0000 mg | ORAL_TABLET | Freq: Two times a day (BID) | ORAL | Status: DC

## 2012-09-06 MED ORDER — ISOSORBIDE MONONITRATE ER 30 MG PO TB24: 30.0000 mg | ORAL_TABLET | Freq: Every day | ORAL | Status: DC

## 2012-09-06 MED ORDER — B COMPLEX PO TABS: 1.0000 | ORAL_TABLET | Freq: Every day | ORAL | Status: DC

## 2012-09-06 MED ORDER — ACIDOPHILUS 100 MG PO CAPS: 100.0000 mg | ORAL_CAPSULE | Freq: Every day | ORAL | Status: DC

## 2012-09-06 NOTE — Telephone Encounter (Signed)
Rx's have been sent in, pt is aware.

## 2012-09-10 DIAGNOSIS — I872 Venous insufficiency (chronic) (peripheral): Secondary | ICD-10-CM | POA: Diagnosis not present

## 2012-09-11 ENCOUNTER — Other Ambulatory Visit: Payer: Self-pay | Admitting: Pulmonary Disease

## 2012-09-11 MED ORDER — ISOSORBIDE MONONITRATE ER 30 MG PO TB24: 30.0000 mg | ORAL_TABLET | Freq: Every day | ORAL | Status: DC

## 2012-09-11 MED ORDER — GABAPENTIN 100 MG PO CAPS: 100.0000 mg | ORAL_CAPSULE | Freq: Every day | ORAL | Status: DC

## 2012-09-11 MED ORDER — B COMPLEX PO TABS: 1.0000 | ORAL_TABLET | Freq: Every day | ORAL | Status: DC

## 2012-09-11 MED ORDER — ACIDOPHILUS 100 MG PO CAPS: 100.0000 mg | ORAL_CAPSULE | Freq: Every day | ORAL | Status: DC

## 2012-09-11 MED ORDER — METOPROLOL TARTRATE 25 MG PO TABS: 25.0000 mg | ORAL_TABLET | Freq: Two times a day (BID) | ORAL | Status: DC

## 2012-09-11 MED ORDER — ONE-DAILY MULTI VITAMINS PO TABS: 1.0000 | ORAL_TABLET | Freq: Every day | ORAL | Status: DC

## 2012-09-20 DIAGNOSIS — G4700 Insomnia, unspecified: Secondary | ICD-10-CM | POA: Diagnosis not present

## 2012-09-27 DIAGNOSIS — S81809A Unspecified open wound, unspecified lower leg, initial encounter: Secondary | ICD-10-CM | POA: Diagnosis not present

## 2012-09-27 DIAGNOSIS — S91009A Unspecified open wound, unspecified ankle, initial encounter: Secondary | ICD-10-CM | POA: Diagnosis not present

## 2012-09-27 DIAGNOSIS — Z8614 Personal history of Methicillin resistant Staphylococcus aureus infection: Secondary | ICD-10-CM | POA: Diagnosis not present

## 2012-10-03 DIAGNOSIS — S81009A Unspecified open wound, unspecified knee, initial encounter: Secondary | ICD-10-CM | POA: Diagnosis not present

## 2012-10-03 DIAGNOSIS — I872 Venous insufficiency (chronic) (peripheral): Secondary | ICD-10-CM | POA: Diagnosis not present

## 2012-10-11 DIAGNOSIS — I872 Venous insufficiency (chronic) (peripheral): Secondary | ICD-10-CM | POA: Diagnosis not present

## 2012-10-11 DIAGNOSIS — S91009A Unspecified open wound, unspecified ankle, initial encounter: Secondary | ICD-10-CM | POA: Diagnosis not present

## 2012-10-11 DIAGNOSIS — S81009A Unspecified open wound, unspecified knee, initial encounter: Secondary | ICD-10-CM | POA: Diagnosis not present

## 2012-10-11 DIAGNOSIS — S81809A Unspecified open wound, unspecified lower leg, initial encounter: Secondary | ICD-10-CM | POA: Diagnosis not present

## 2012-10-17 ENCOUNTER — Other Ambulatory Visit: Payer: Self-pay | Admitting: Geriatric Medicine

## 2012-10-17 MED ORDER — LORAZEPAM 1 MG PO TABS: 1.0000 mg | ORAL_TABLET | Freq: Four times a day (QID) | ORAL | Status: DC | PRN

## 2012-10-18 DIAGNOSIS — S81809A Unspecified open wound, unspecified lower leg, initial encounter: Secondary | ICD-10-CM | POA: Diagnosis not present

## 2012-10-18 DIAGNOSIS — S91009A Unspecified open wound, unspecified ankle, initial encounter: Secondary | ICD-10-CM | POA: Diagnosis not present

## 2012-10-18 DIAGNOSIS — I872 Venous insufficiency (chronic) (peripheral): Secondary | ICD-10-CM | POA: Diagnosis not present

## 2012-10-18 DIAGNOSIS — L089 Local infection of the skin and subcutaneous tissue, unspecified: Secondary | ICD-10-CM | POA: Diagnosis not present

## 2012-10-25 DIAGNOSIS — A4902 Methicillin resistant Staphylococcus aureus infection, unspecified site: Secondary | ICD-10-CM | POA: Diagnosis not present

## 2012-10-25 DIAGNOSIS — S81809A Unspecified open wound, unspecified lower leg, initial encounter: Secondary | ICD-10-CM | POA: Diagnosis not present

## 2012-10-25 DIAGNOSIS — S81009A Unspecified open wound, unspecified knee, initial encounter: Secondary | ICD-10-CM | POA: Diagnosis not present

## 2012-11-01 DIAGNOSIS — S81809A Unspecified open wound, unspecified lower leg, initial encounter: Secondary | ICD-10-CM | POA: Diagnosis not present

## 2012-11-01 DIAGNOSIS — S81009A Unspecified open wound, unspecified knee, initial encounter: Secondary | ICD-10-CM | POA: Diagnosis not present

## 2012-11-01 DIAGNOSIS — S91009A Unspecified open wound, unspecified ankle, initial encounter: Secondary | ICD-10-CM | POA: Diagnosis not present

## 2012-11-01 DIAGNOSIS — A4902 Methicillin resistant Staphylococcus aureus infection, unspecified site: Secondary | ICD-10-CM | POA: Diagnosis not present

## 2012-11-01 DIAGNOSIS — I872 Venous insufficiency (chronic) (peripheral): Secondary | ICD-10-CM | POA: Diagnosis not present

## 2012-11-05 DIAGNOSIS — I872 Venous insufficiency (chronic) (peripheral): Secondary | ICD-10-CM | POA: Diagnosis not present

## 2012-11-05 DIAGNOSIS — S91009A Unspecified open wound, unspecified ankle, initial encounter: Secondary | ICD-10-CM | POA: Diagnosis not present

## 2012-11-05 DIAGNOSIS — S81809A Unspecified open wound, unspecified lower leg, initial encounter: Secondary | ICD-10-CM | POA: Diagnosis not present

## 2012-11-05 DIAGNOSIS — A4902 Methicillin resistant Staphylococcus aureus infection, unspecified site: Secondary | ICD-10-CM | POA: Diagnosis not present

## 2012-11-19 ENCOUNTER — Ambulatory Visit (INDEPENDENT_AMBULATORY_CARE_PROVIDER_SITE_OTHER): Payer: Medicare Other | Admitting: Adult Health

## 2012-11-19 ENCOUNTER — Other Ambulatory Visit (INDEPENDENT_AMBULATORY_CARE_PROVIDER_SITE_OTHER): Payer: Medicare Other

## 2012-11-19 ENCOUNTER — Encounter: Payer: Self-pay | Admitting: Adult Health

## 2012-11-19 VITALS — BP 122/74 | HR 68 | Temp 98.4°F | Ht 66.0 in | Wt 125.0 lb

## 2012-11-19 DIAGNOSIS — J42 Unspecified chronic bronchitis: Secondary | ICD-10-CM

## 2012-11-19 DIAGNOSIS — I10 Essential (primary) hypertension: Secondary | ICD-10-CM

## 2012-11-19 DIAGNOSIS — J9600 Acute respiratory failure, unspecified whether with hypoxia or hypercapnia: Secondary | ICD-10-CM

## 2012-11-19 DIAGNOSIS — J9601 Acute respiratory failure with hypoxia: Secondary | ICD-10-CM

## 2012-11-19 DIAGNOSIS — K219 Gastro-esophageal reflux disease without esophagitis: Secondary | ICD-10-CM

## 2012-11-19 LAB — CBC WITH DIFFERENTIAL/PLATELET
Basophils Absolute: 0 10*3/uL (ref 0.0–0.1)
Basophils Relative: 0.4 % (ref 0.0–3.0)
Eosinophils Absolute: 0.3 10*3/uL (ref 0.0–0.7)
Eosinophils Relative: 3.5 % (ref 0.0–5.0)
HCT: 45.7 % (ref 36.0–46.0)
Hemoglobin: 15.8 g/dL — ABNORMAL HIGH (ref 12.0–15.0)
Lymphocytes Relative: 37.6 % (ref 12.0–46.0)
Lymphs Abs: 3.1 10*3/uL (ref 0.7–4.0)
MCHC: 34.6 g/dL (ref 30.0–36.0)
MCV: 91.9 fl (ref 78.0–100.0)
Monocytes Absolute: 0.7 10*3/uL (ref 0.1–1.0)
Monocytes Relative: 7.9 % (ref 3.0–12.0)
Neutro Abs: 4.2 10*3/uL (ref 1.4–7.7)
Neutrophils Relative %: 50.6 % (ref 43.0–77.0)
Platelets: 175 10*3/uL (ref 150.0–400.0)
RBC: 4.97 Mil/uL (ref 3.87–5.11)
RDW: 14.2 % (ref 11.5–14.6)
WBC: 8.3 10*3/uL (ref 4.5–10.5)

## 2012-11-19 LAB — BASIC METABOLIC PANEL
Calcium: 9.9 mg/dL (ref 8.4–10.5)
Chloride: 103 mEq/L (ref 96–112)
Creatinine, Ser: 0.9 mg/dL (ref 0.4–1.2)
GFR: 63.17 mL/min (ref >60.00)

## 2012-11-19 NOTE — Assessment & Plan Note (Signed)
No flare  Cont on current regimen

## 2012-11-19 NOTE — Assessment & Plan Note (Signed)
Controlled on rx   
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
## 2012-11-19 NOTE — Patient Instructions (Addendum)
Continue on current regimen.  follow up Dr. Kriste Basque  In 3 months and As needed   Please contact office for sooner follow up if symptoms do not improve or worsen or seek emergency care

## 2012-11-19 NOTE — Assessment & Plan Note (Signed)
Controlled on rx  Low salt diet  

## 2012-11-20 MED ORDER — ISOSORBIDE MONONITRATE ER 30 MG PO TB24: 30.0000 mg | ORAL_TABLET | Freq: Every day | ORAL | Status: DC

## 2012-11-20 MED ORDER — RANITIDINE HCL 150 MG PO TABS: 150.0000 mg | ORAL_TABLET | Freq: Every day | ORAL | Status: DC

## 2012-11-20 MED ORDER — FLUTICASONE-SALMETEROL 250-50 MCG/DOSE IN AEPB: 1.0000 | INHALATION_SPRAY | Freq: Two times a day (BID) | RESPIRATORY_TRACT | Status: DC

## 2012-11-20 MED ORDER — GABAPENTIN 100 MG PO CAPS: 100.0000 mg | ORAL_CAPSULE | Freq: Every day | ORAL | Status: DC

## 2012-11-20 MED ORDER — FUROSEMIDE 20 MG PO TABS: 40.0000 mg | ORAL_TABLET | Freq: Every day | ORAL | Status: DC

## 2012-11-20 MED ORDER — METOPROLOL TARTRATE 25 MG PO TABS: 25.0000 mg | ORAL_TABLET | Freq: Two times a day (BID) | ORAL | Status: DC

## 2012-11-20 MED ORDER — LEVOTHYROXINE SODIUM 100 MCG PO TABS: 100.0000 ug | ORAL_TABLET | Freq: Every day | ORAL | Status: DC

## 2012-11-20 MED ORDER — DICLOFENAC SODIUM 1 % TD GEL: TRANSDERMAL | Status: DC

## 2012-11-20 MED ORDER — IMIPRAMINE HCL 25 MG PO TABS: 50.0000 mg | ORAL_TABLET | Freq: Every day | ORAL | Status: DC

## 2012-11-20 MED ORDER — HYDROCODONE-ACETAMINOPHEN 5-325 MG PO TABS: ORAL_TABLET | ORAL | Status: DC

## 2012-11-20 MED ORDER — POTASSIUM CHLORIDE CRYS ER 20 MEQ PO TBCR: 20.0000 meq | EXTENDED_RELEASE_TABLET | Freq: Every day | ORAL | Status: DC

## 2012-11-20 MED ORDER — LORAZEPAM 1 MG PO TABS: 1.0000 mg | ORAL_TABLET | Freq: Four times a day (QID) | ORAL | Status: DC | PRN

## 2012-11-20 NOTE — Addendum Note (Signed)
Addended by: Boone Master E on: 11/20/2012 04:03 PM   Modules accepted: Orders

## 2012-11-29 ENCOUNTER — Other Ambulatory Visit: Payer: Self-pay | Admitting: Adult Health

## 2012-11-29 NOTE — Progress Notes (Signed)
Quick Note:  Called spoke with patient, advised of lab results / recs as stated by TP. Pt verbalized her understanding and denied any questions. ______ 

## 2012-11-29 NOTE — Telephone Encounter (Signed)
Received faxed refill request from [facility] for: Lorazepam 1mg   #90  1po TIDprn  Rx was filled 5.20.14 to [facility] at [phone], spoke with Angie and advised her that it has already been filled Will sign off

## 2012-12-04 ENCOUNTER — Telehealth: Payer: Self-pay | Admitting: Pulmonary Disease

## 2012-12-04 NOTE — Telephone Encounter (Signed)
Unsure why pt has not received her meds yet at as I had personally called these into [facility] w/ pt's updated address Called [facility], spoke with Meriam Sprague who reported that pt's meds shipped on 5.24.14 and arrived at the post office on 5.27.14 [facility] is unable to track after that point but Meriam Sprague was able to provide me with a tracking number and stated that the local [employer] can be called  Tracking number (verified): [id number]  Tracking number given to patient who wrote the number down, and repeated for verification Pt to have her daughter Jasmine December call [employer] and will have her call back to follow up  Nothing further needed at this time; will sign off

## 2013-01-21 ENCOUNTER — Telehealth: Payer: Self-pay | Admitting: Pulmonary Disease

## 2013-01-21 NOTE — Telephone Encounter (Signed)
lmomtcb for the pt to discuss phone message with pt.

## 2013-01-21 NOTE — Telephone Encounter (Signed)
[email or web address].Darya Bigler, CMA  

## 2013-01-21 NOTE — Telephone Encounter (Signed)
Pt returned call. [name] °

## 2013-01-22 NOTE — Telephone Encounter (Signed)
I spoke with the pt and she states that she used to be on nexium in the past due to reflux but has not been on it in a while. Pt states she has been having a lot of heartburn lately and wants to get an RX for nexium. Please advise.Carron Curie, CMA   Pt also requests refill on acidophilus 100mg , I have note sent this in, waiting on ok for nexium and then i will send both. Carron Curie, CMA

## 2013-01-22 NOTE — Telephone Encounter (Signed)
LMTCB

## 2013-01-22 NOTE — Telephone Encounter (Signed)
Pt returned call. [name]  

## 2013-01-23 MED ORDER — ACIDOPHILUS 100 MG PO CAPS: 100.0000 mg | ORAL_CAPSULE | Freq: Every day | ORAL | Status: DC

## 2013-01-23 MED ORDER — ESOMEPRAZOLE MAGNESIUM 40 MG PO CPDR: 40.0000 mg | DELAYED_RELEASE_CAPSULE | Freq: Every day | ORAL | Status: DC

## 2013-01-23 NOTE — Telephone Encounter (Signed)
Per SN---  Ok for the nexium 40 mg  1 daily taken 30 mins before a meal.acidophilus ok---most of our pts take the align once daily.  Either one is ok.  thanks

## 2013-01-23 NOTE — Telephone Encounter (Signed)
Pt advised and rx sent. Mozel Burdett, CMA  

## 2013-01-23 NOTE — Telephone Encounter (Signed)
LMTCB

## 2013-02-20 ENCOUNTER — Ambulatory Visit (INDEPENDENT_AMBULATORY_CARE_PROVIDER_SITE_OTHER): Payer: Medicare Other | Admitting: Pulmonary Disease

## 2013-02-20 ENCOUNTER — Ambulatory Visit (INDEPENDENT_AMBULATORY_CARE_PROVIDER_SITE_OTHER)
Admission: RE | Admit: 2013-02-20 | Discharge: 2013-02-20 | Disposition: A | Discharge status: Home / Self Care | Payer: Medicare Other | Source: Ambulatory Visit | Attending: Pulmonary Disease | Admitting: Pulmonary Disease

## 2013-02-20 ENCOUNTER — Encounter: Payer: Self-pay | Admitting: Pulmonary Disease

## 2013-02-20 VITALS — BP 140/78 | HR 67 | Temp 98.0°F | Ht 66.0 in | Wt 155.8 lb

## 2013-02-20 DIAGNOSIS — I872 Venous insufficiency (chronic) (peripheral): Secondary | ICD-10-CM

## 2013-02-20 DIAGNOSIS — I739 Peripheral vascular disease, unspecified: Secondary | ICD-10-CM

## 2013-02-20 DIAGNOSIS — E039 Hypothyroidism, unspecified: Secondary | ICD-10-CM

## 2013-02-20 DIAGNOSIS — M545 Low back pain: Secondary | ICD-10-CM

## 2013-02-20 DIAGNOSIS — I519 Heart disease, unspecified: Secondary | ICD-10-CM | POA: Diagnosis not present

## 2013-02-20 DIAGNOSIS — M79609 Pain in unspecified limb: Secondary | ICD-10-CM

## 2013-02-20 DIAGNOSIS — M79671 Pain in right foot: Secondary | ICD-10-CM

## 2013-02-20 DIAGNOSIS — I10 Essential (primary) hypertension: Secondary | ICD-10-CM | POA: Diagnosis not present

## 2013-02-20 DIAGNOSIS — J984 Other disorders of lung: Secondary | ICD-10-CM | POA: Diagnosis not present

## 2013-02-20 DIAGNOSIS — G459 Transient cerebral ischemic attack, unspecified: Secondary | ICD-10-CM

## 2013-02-20 DIAGNOSIS — K589 Irritable bowel syndrome without diarrhea: Secondary | ICD-10-CM

## 2013-02-20 DIAGNOSIS — C50919 Malignant neoplasm of unspecified site of unspecified female breast: Secondary | ICD-10-CM

## 2013-02-20 DIAGNOSIS — M899 Disorder of bone, unspecified: Secondary | ICD-10-CM

## 2013-02-20 DIAGNOSIS — M1990 Unspecified osteoarthritis, unspecified site: Secondary | ICD-10-CM

## 2013-02-20 DIAGNOSIS — K219 Gastro-esophageal reflux disease without esophagitis: Secondary | ICD-10-CM

## 2013-02-20 DIAGNOSIS — F411 Generalized anxiety disorder: Secondary | ICD-10-CM

## 2013-02-20 MED ORDER — ALIGN 4 MG PO CAPS: 1.0000 | ORAL_CAPSULE | Freq: Every day | ORAL | Status: DC

## 2013-02-20 MED ORDER — ZOLPIDEM TARTRATE 10 MG PO TABS: 10.0000 mg | ORAL_TABLET | Freq: Every evening | ORAL | Status: DC | PRN

## 2013-02-20 NOTE — Patient Instructions (Addendum)
Today we updated your med list in our EPIC system...    Continue your current medications the same...  We wrote a new prescription for AMBIEN (Zolpidem) 5mg  to take at bedtime as needed for sleep...  You should also get back on the ALIGN probiotic to help your GI tract...   We will refer you to [facility] for an evaluation of your right foot...today we did a follow up CXR...    We will contact you w/ the results when available...   Call for any questions...  Let's plan a follow up visit in 42mo, sooner if needed for problems.Marland KitchenMarland Kitchen
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
## 2013-03-05 ENCOUNTER — Telehealth: Payer: Self-pay | Admitting: Pulmonary Disease
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
## 2013-03-13 DIAGNOSIS — Z961 Presence of intraocular lens: Secondary | ICD-10-CM | POA: Diagnosis not present

## 2013-03-13 DIAGNOSIS — H04129 Dry eye syndrome of unspecified lacrimal gland: Secondary | ICD-10-CM | POA: Diagnosis not present

## 2013-03-13 DIAGNOSIS — H4011X0 Primary open-angle glaucoma, stage unspecified: Secondary | ICD-10-CM | POA: Diagnosis not present

## 2013-03-28 DIAGNOSIS — Z23 Encounter for immunization: Secondary | ICD-10-CM | POA: Diagnosis not present

## 2013-05-02 ENCOUNTER — Other Ambulatory Visit: Payer: Self-pay

## 2013-05-02 MED ORDER — LORAZEPAM 1 MG PO TABS: 1.0000 mg | ORAL_TABLET | Freq: Four times a day (QID) | ORAL | Status: DC | PRN

## 2013-05-13 DIAGNOSIS — M19079 Primary osteoarthritis, unspecified ankle and foot: Secondary | ICD-10-CM | POA: Diagnosis not present

## 2013-05-20 ENCOUNTER — Telehealth: Payer: Self-pay | Admitting: Pulmonary Disease

## 2013-05-20 NOTE — Telephone Encounter (Signed)
lmomtcb x1 for pt--need to make pt aware of new FDA law-norco has to be picked up in the office or mailed to her home address  lorazpema 1 mg is showing RX was printed by another NP on 05/02/13 #90 x 1 refill. Did she get this?

## 2013-05-22 NOTE — Telephone Encounter (Signed)
lmomtcb x 2  

## 2013-05-24 NOTE — Telephone Encounter (Signed)
LMTCBx3. Jennifer Castillo, CMA  

## 2013-05-27 NOTE — Telephone Encounter (Signed)
LMTCBx4 on cell and home number. If no call back today then per protocol will sign off on message. Carron Curie, CMA

## 2013-05-28 NOTE — Telephone Encounter (Signed)
Per protocol I will sign off on message and await call back. Annjeanette Sarwar, CMA  

## 2013-06-24 ENCOUNTER — Encounter: Payer: Self-pay | Admitting: Pulmonary Disease

## 2013-06-24 ENCOUNTER — Ambulatory Visit (INDEPENDENT_AMBULATORY_CARE_PROVIDER_SITE_OTHER): Payer: Medicare Other | Admitting: Pulmonary Disease

## 2013-06-24 VITALS — BP 136/72 | HR 63 | Temp 97.4°F | Ht 66.0 in | Wt 166.0 lb

## 2013-06-24 DIAGNOSIS — M899 Disorder of bone, unspecified: Secondary | ICD-10-CM

## 2013-06-24 DIAGNOSIS — I872 Venous insufficiency (chronic) (peripheral): Secondary | ICD-10-CM

## 2013-06-24 DIAGNOSIS — E039 Hypothyroidism, unspecified: Secondary | ICD-10-CM

## 2013-06-24 DIAGNOSIS — K219 Gastro-esophageal reflux disease without esophagitis: Secondary | ICD-10-CM

## 2013-06-24 DIAGNOSIS — I10 Essential (primary) hypertension: Secondary | ICD-10-CM

## 2013-06-24 DIAGNOSIS — I739 Peripheral vascular disease, unspecified: Secondary | ICD-10-CM

## 2013-06-24 DIAGNOSIS — F411 Generalized anxiety disorder: Secondary | ICD-10-CM

## 2013-06-24 DIAGNOSIS — C50919 Malignant neoplasm of unspecified site of unspecified female breast: Secondary | ICD-10-CM

## 2013-06-24 DIAGNOSIS — I519 Heart disease, unspecified: Secondary | ICD-10-CM | POA: Diagnosis not present

## 2013-06-24 DIAGNOSIS — M545 Low back pain: Secondary | ICD-10-CM

## 2013-06-24 DIAGNOSIS — M1990 Unspecified osteoarthritis, unspecified site: Secondary | ICD-10-CM

## 2013-06-24 DIAGNOSIS — K589 Irritable bowel syndrome without diarrhea: Secondary | ICD-10-CM

## 2013-06-24 DIAGNOSIS — G459 Transient cerebral ischemic attack, unspecified: Secondary | ICD-10-CM

## 2013-06-24 MED ORDER — LORAZEPAM 1 MG PO TABS: 1.0000 mg | ORAL_TABLET | Freq: Four times a day (QID) | ORAL | Status: DC | PRN

## 2013-06-24 MED ORDER — ZOLPIDEM TARTRATE 10 MG PO TABS: 10.0000 mg | ORAL_TABLET | Freq: Every evening | ORAL | Status: DC | PRN

## 2013-06-24 MED ORDER — DICYCLOMINE HCL 10 MG PO CAPS: 10.0000 mg | ORAL_CAPSULE | Freq: Three times a day (TID) | ORAL | Status: DC

## 2013-06-24 NOTE — Patient Instructions (Signed)
Today we updated your med list in our EPIC system...    Continue your current medications the same...  For your digestion I rec a PROBIOTIC like Align, Philips colon health, etc..    You may also benefit from ACTIVIA yogurt...  For the lower abd pain (spasms)>>    Try the BENTYL (Dicyclomine) 10mg  tab take one up to 3 times daily as needed for the cramping...  Call for any questions...  Let's plan a follow up visit in 69mo, sooner if needed for problems.Marland KitchenMarland Kitchen
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
## 2013-08-27 ENCOUNTER — Telehealth: Payer: Self-pay | Admitting: Pulmonary Disease

## 2013-08-27 DIAGNOSIS — K2990 Gastroduodenitis, unspecified, without bleeding: Secondary | ICD-10-CM

## 2013-08-27 DIAGNOSIS — M899 Disorder of bone, unspecified: Secondary | ICD-10-CM

## 2013-08-27 DIAGNOSIS — M1990 Unspecified osteoarthritis, unspecified site: Secondary | ICD-10-CM

## 2013-08-27 DIAGNOSIS — K589 Irritable bowel syndrome without diarrhea: Secondary | ICD-10-CM

## 2013-08-27 DIAGNOSIS — K2970 Gastritis, unspecified, without bleeding: Secondary | ICD-10-CM

## 2013-08-27 DIAGNOSIS — I10 Essential (primary) hypertension: Secondary | ICD-10-CM

## 2013-08-27 DIAGNOSIS — K219 Gastro-esophageal reflux disease without esophagitis: Secondary | ICD-10-CM

## 2013-08-27 DIAGNOSIS — C50919 Malignant neoplasm of unspecified site of unspecified female breast: Secondary | ICD-10-CM

## 2013-08-27 DIAGNOSIS — E039 Hypothyroidism, unspecified: Secondary | ICD-10-CM

## 2013-08-27 DIAGNOSIS — I519 Heart disease, unspecified: Secondary | ICD-10-CM

## 2013-08-27 DIAGNOSIS — I872 Venous insufficiency (chronic) (peripheral): Secondary | ICD-10-CM

## 2013-08-27 DIAGNOSIS — I739 Peripheral vascular disease, unspecified: Secondary | ICD-10-CM

## 2013-08-27 DIAGNOSIS — M797 Fibromyalgia: Secondary | ICD-10-CM

## 2013-08-27 DIAGNOSIS — M545 Low back pain, unspecified: Secondary | ICD-10-CM

## 2013-08-27 DIAGNOSIS — G459 Transient cerebral ischemic attack, unspecified: Secondary | ICD-10-CM

## 2013-08-27 DIAGNOSIS — M949 Disorder of cartilage, unspecified: Secondary | ICD-10-CM

## 2013-08-27 DIAGNOSIS — F411 Generalized anxiety disorder: Secondary | ICD-10-CM

## 2013-08-27 NOTE — Telephone Encounter (Signed)
Last OV 06/2013 Last fill of both medications 06/24/13 with 5 refills.  SN - please advise. Thanks.

## 2013-08-28 MED ORDER — ZOLPIDEM TARTRATE 10 MG PO TABS: 10.0000 mg | ORAL_TABLET | Freq: Every evening | ORAL | Status: DC | PRN

## 2013-08-28 MED ORDER — LORAZEPAM 1 MG PO TABS: 1.0000 mg | ORAL_TABLET | Freq: Four times a day (QID) | ORAL | Status: DC | PRN

## 2013-08-28 NOTE — Telephone Encounter (Signed)
lmtcb x1 for pt rx's printed and will fax once signed by SN

## 2013-08-28 NOTE — Telephone Encounter (Signed)
Per SN---  Ok to refill meds.   Please advise the pt that she will need to be set up with a new primary care doctor.  SN will be retiring from primary care as of April 1.  thanks

## 2013-08-30 NOTE — Telephone Encounter (Signed)
lmomtcb x 2  

## 2013-09-02 NOTE — Telephone Encounter (Signed)
LMTCBx1 on home # and cell # listed. Need to verify meds needed. [place] Bing, CMA

## 2013-09-02 NOTE — Telephone Encounter (Signed)
lmtcb

## 2013-09-02 NOTE — Telephone Encounter (Signed)
Pt returning call.[name] ° °

## 2013-09-03 NOTE — Telephone Encounter (Addendum)
LMTCBx2. [place] Bing, CMA Per SN---  Ok to refill meds. Please advise the pt that she will need to be set up with a new primary care doctor. SN will be retiring from primary care as of April 1. thanks  meds have been sent.

## 2013-09-03 NOTE — Telephone Encounter (Signed)
Pt advised rx sent and referal placed for PCP. [place] Bing, CMA

## 2013-09-06 ENCOUNTER — Telehealth: Payer: Self-pay | Admitting: Pulmonary Disease

## 2013-09-06 NOTE — Telephone Encounter (Signed)
Per SN---  Tell her SN will be happy to cont to see her but she may end of having to pay out of pocket since he will be seeing pulmonary pts only.  thanks

## 2013-09-06 NOTE — Telephone Encounter (Signed)
Please advise SN thanks 

## 2013-09-06 NOTE — Telephone Encounter (Signed)
I called and made pt aware. She is going to talk to her daughter and let us know. Nothing further needed

## 2013-09-09 ENCOUNTER — Telehealth: Payer: Self-pay | Admitting: Pulmonary Disease

## 2013-09-09 DIAGNOSIS — M545 Low back pain, unspecified: Secondary | ICD-10-CM

## 2013-09-09 DIAGNOSIS — M797 Fibromyalgia: Secondary | ICD-10-CM

## 2013-09-09 DIAGNOSIS — E039 Hypothyroidism, unspecified: Secondary | ICD-10-CM

## 2013-09-09 DIAGNOSIS — M899 Disorder of bone, unspecified: Secondary | ICD-10-CM

## 2013-09-09 DIAGNOSIS — K589 Irritable bowel syndrome without diarrhea: Secondary | ICD-10-CM

## 2013-09-09 DIAGNOSIS — G459 Transient cerebral ischemic attack, unspecified: Secondary | ICD-10-CM

## 2013-09-09 DIAGNOSIS — I10 Essential (primary) hypertension: Secondary | ICD-10-CM

## 2013-09-09 DIAGNOSIS — M1990 Unspecified osteoarthritis, unspecified site: Secondary | ICD-10-CM

## 2013-09-09 DIAGNOSIS — M949 Disorder of cartilage, unspecified: Secondary | ICD-10-CM

## 2013-09-09 DIAGNOSIS — K219 Gastro-esophageal reflux disease without esophagitis: Secondary | ICD-10-CM

## 2013-09-09 DIAGNOSIS — F411 Generalized anxiety disorder: Secondary | ICD-10-CM

## 2013-09-09 NOTE — Telephone Encounter (Signed)
Called spoke with patient's daughter and discussed with her that SN will no longer be seeing Primary Care patients as of April 1.  We have been trying to accomodate patients and send referrals to offices close to home, but Ivin Booty would like her mother to stay within [employer] building.  Advised will send referral to [facility].  Patient does have an upcoming appt with SN 6.22.15 for follow up - this has been cancelled.  Pt's daughter verbalized her understanding and denied any further questions at this time.  She will have pt contact her pharmacy about any refills needed.  Due to the short timeline, will request PC consult with 1st available physician downstairs.  Nothing further needed; will sign off.

## 2013-09-10 ENCOUNTER — Telehealth: Payer: Self-pay | Admitting: Pulmonary Disease

## 2013-09-10 DIAGNOSIS — G459 Transient cerebral ischemic attack, unspecified: Secondary | ICD-10-CM

## 2013-09-10 DIAGNOSIS — F411 Generalized anxiety disorder: Secondary | ICD-10-CM

## 2013-09-10 DIAGNOSIS — M1990 Unspecified osteoarthritis, unspecified site: Secondary | ICD-10-CM

## 2013-09-10 DIAGNOSIS — K2990 Gastroduodenitis, unspecified, without bleeding: Secondary | ICD-10-CM

## 2013-09-10 DIAGNOSIS — I739 Peripheral vascular disease, unspecified: Secondary | ICD-10-CM

## 2013-09-10 DIAGNOSIS — I872 Venous insufficiency (chronic) (peripheral): Secondary | ICD-10-CM

## 2013-09-10 DIAGNOSIS — M545 Low back pain, unspecified: Secondary | ICD-10-CM

## 2013-09-10 DIAGNOSIS — M949 Disorder of cartilage, unspecified: Secondary | ICD-10-CM

## 2013-09-10 DIAGNOSIS — I519 Heart disease, unspecified: Secondary | ICD-10-CM

## 2013-09-10 DIAGNOSIS — K589 Irritable bowel syndrome without diarrhea: Secondary | ICD-10-CM

## 2013-09-10 DIAGNOSIS — M797 Fibromyalgia: Secondary | ICD-10-CM

## 2013-09-10 DIAGNOSIS — M899 Disorder of bone, unspecified: Secondary | ICD-10-CM

## 2013-09-10 DIAGNOSIS — K2970 Gastritis, unspecified, without bleeding: Secondary | ICD-10-CM

## 2013-09-10 DIAGNOSIS — K219 Gastro-esophageal reflux disease without esophagitis: Secondary | ICD-10-CM

## 2013-09-10 DIAGNOSIS — I10 Essential (primary) hypertension: Secondary | ICD-10-CM

## 2013-09-10 DIAGNOSIS — C50919 Malignant neoplasm of unspecified site of unspecified female breast: Secondary | ICD-10-CM

## 2013-09-10 DIAGNOSIS — E039 Hypothyroidism, unspecified: Secondary | ICD-10-CM

## 2013-09-10 MED ORDER — LORAZEPAM 1 MG PO TABS: 1.0000 mg | ORAL_TABLET | Freq: Four times a day (QID) | ORAL | Status: DC | PRN

## 2013-09-10 MED ORDER — ZOLPIDEM TARTRATE 10 MG PO TABS: 10.0000 mg | ORAL_TABLET | Freq: Every evening | ORAL | Status: DC | PRN

## 2013-09-10 NOTE — Telephone Encounter (Signed)
Pt advised. Tahir Blank, CMA  

## 2013-09-10 NOTE — Telephone Encounter (Signed)
Rx were printed on 08-28-13 and were supposed to be faxed but I called [facility] and they did not receive these refills. I called and spoke with Altha Harm, pharmacists at [facility], and gave verbal order for lorazepam and ambien. LMTCB to advise the pt. [place] Bing, CMA

## 2013-09-16 ENCOUNTER — Telehealth: Payer: Self-pay | Admitting: Pulmonary Disease

## 2013-09-16 NOTE — Telephone Encounter (Signed)
I do not see where anyone has tried calling pt. Leigh have you tried calling pt? thanks

## 2013-09-16 NOTE — Telephone Encounter (Signed)
lmomtcb for the pt -----order has been placed to get her set up with new primary care at the [facility]

## 2013-09-17 ENCOUNTER — Telehealth: Payer: Self-pay | Admitting: Pulmonary Disease

## 2013-09-17 NOTE — Telephone Encounter (Signed)
lmomtcb x1 for pt 

## 2013-09-18 NOTE — Telephone Encounter (Signed)
Duplicate message see message from 09-17-13.[place] Bing, CMA

## 2013-09-18 NOTE — Telephone Encounter (Signed)
Pt returned call to nurse.

## 2013-09-18 NOTE — Telephone Encounter (Signed)
Spoke with pt - She states that she is out of Zolpidem and Lorazepam and wants these sent to [facility] for delivery. - Spoke with Richardson Landry at [facility] and he states that these were mailed to pt on 09/14/13 and should receive by 09/20/13.  Pt states that she can wait until then for the rx.  Also pt states that her daughter is going to make appt for her to establish with new PCP (Dr Reymundo Poll - [phone])

## 2013-09-27 ENCOUNTER — Telehealth: Payer: Self-pay

## 2013-09-27 NOTE — Telephone Encounter (Signed)
Received a call from patient stating she was to call regarding pcp placement.

## 2013-10-15 ENCOUNTER — Telehealth: Payer: Self-pay | Admitting: Pulmonary Disease

## 2013-10-15 NOTE — Telephone Encounter (Signed)
Spoke pt's daughter. Asked what medications she needs. She has no clue. States that we need to call the pt and find out.  Left message for pt to call back x1

## 2013-10-16 MED ORDER — FUROSEMIDE 20 MG PO TABS: 40.0000 mg | ORAL_TABLET | Freq: Every day | ORAL | Status: DC

## 2013-10-16 MED ORDER — LEVOTHYROXINE SODIUM 100 MCG PO TABS: 100.0000 ug | ORAL_TABLET | Freq: Every day | ORAL | Status: DC

## 2013-10-16 MED ORDER — POTASSIUM CHLORIDE CRYS ER 20 MEQ PO TBCR: 20.0000 meq | EXTENDED_RELEASE_TABLET | Freq: Every day | ORAL | Status: DC

## 2013-10-16 MED ORDER — ESOMEPRAZOLE MAGNESIUM 40 MG PO CPDR: 40.0000 mg | DELAYED_RELEASE_CAPSULE | Freq: Every day | ORAL | Status: DC

## 2013-10-16 MED ORDER — ISOSORBIDE MONONITRATE ER 30 MG PO TB24: 30.0000 mg | ORAL_TABLET | Freq: Every day | ORAL | Status: DC

## 2013-10-16 MED ORDER — METOPROLOL TARTRATE 25 MG PO TABS: 25.0000 mg | ORAL_TABLET | Freq: Two times a day (BID) | ORAL | Status: DC

## 2013-10-16 MED ORDER — GABAPENTIN 100 MG PO CAPS: 100.0000 mg | ORAL_CAPSULE | Freq: Every day | ORAL | Status: DC

## 2013-10-16 MED ORDER — RANITIDINE HCL 150 MG PO TABS: 150.0000 mg | ORAL_TABLET | Freq: Every day | ORAL | Status: DC

## 2013-10-16 NOTE — Telephone Encounter (Signed)
Pt has returned all.

## 2013-10-16 NOTE — Telephone Encounter (Signed)
Spoke with pt and she gave me a list of what meds she needed refilled.  Refills sent to [facility].  Pt states that she will be seeing a Dr at her facility to establish soon.

## 2013-10-29 DIAGNOSIS — F411 Generalized anxiety disorder: Secondary | ICD-10-CM | POA: Diagnosis not present

## 2013-10-29 DIAGNOSIS — I1310 Hypertensive heart and chronic kidney disease without heart failure, with stage 1 through stage 4 chronic kidney disease, or unspecified chronic kidney disease: Secondary | ICD-10-CM | POA: Diagnosis not present

## 2013-10-29 DIAGNOSIS — H9190 Unspecified hearing loss, unspecified ear: Secondary | ICD-10-CM | POA: Diagnosis not present

## 2013-10-29 DIAGNOSIS — I6990 Unspecified sequelae of unspecified cerebrovascular disease: Secondary | ICD-10-CM | POA: Diagnosis not present

## 2013-10-29 DIAGNOSIS — F329 Major depressive disorder, single episode, unspecified: Secondary | ICD-10-CM | POA: Diagnosis not present

## 2013-10-29 DIAGNOSIS — M159 Polyosteoarthritis, unspecified: Secondary | ICD-10-CM | POA: Diagnosis not present

## 2013-10-29 DIAGNOSIS — E039 Hypothyroidism, unspecified: Secondary | ICD-10-CM | POA: Diagnosis not present

## 2013-10-29 DIAGNOSIS — G4700 Insomnia, unspecified: Secondary | ICD-10-CM | POA: Diagnosis not present

## 2013-11-04 ENCOUNTER — Telehealth: Payer: Self-pay | Admitting: Pulmonary Disease

## 2013-11-04 ENCOUNTER — Other Ambulatory Visit: Payer: Self-pay | Admitting: Pulmonary Disease

## 2013-11-04 DIAGNOSIS — M1990 Unspecified osteoarthritis, unspecified site: Secondary | ICD-10-CM

## 2013-11-04 DIAGNOSIS — G459 Transient cerebral ischemic attack, unspecified: Secondary | ICD-10-CM

## 2013-11-04 DIAGNOSIS — I519 Heart disease, unspecified: Secondary | ICD-10-CM

## 2013-11-04 DIAGNOSIS — F411 Generalized anxiety disorder: Secondary | ICD-10-CM

## 2013-11-04 DIAGNOSIS — K589 Irritable bowel syndrome without diarrhea: Secondary | ICD-10-CM

## 2013-11-04 DIAGNOSIS — M545 Low back pain, unspecified: Secondary | ICD-10-CM

## 2013-11-04 DIAGNOSIS — K2990 Gastroduodenitis, unspecified, without bleeding: Secondary | ICD-10-CM

## 2013-11-04 DIAGNOSIS — I872 Venous insufficiency (chronic) (peripheral): Secondary | ICD-10-CM

## 2013-11-04 DIAGNOSIS — K2970 Gastritis, unspecified, without bleeding: Secondary | ICD-10-CM

## 2013-11-04 DIAGNOSIS — C50919 Malignant neoplasm of unspecified site of unspecified female breast: Secondary | ICD-10-CM

## 2013-11-04 DIAGNOSIS — M899 Disorder of bone, unspecified: Secondary | ICD-10-CM

## 2013-11-04 DIAGNOSIS — M949 Disorder of cartilage, unspecified: Secondary | ICD-10-CM

## 2013-11-04 DIAGNOSIS — M797 Fibromyalgia: Secondary | ICD-10-CM

## 2013-11-04 DIAGNOSIS — K219 Gastro-esophageal reflux disease without esophagitis: Secondary | ICD-10-CM

## 2013-11-04 DIAGNOSIS — I739 Peripheral vascular disease, unspecified: Secondary | ICD-10-CM

## 2013-11-04 DIAGNOSIS — E039 Hypothyroidism, unspecified: Secondary | ICD-10-CM

## 2013-11-04 DIAGNOSIS — I10 Essential (primary) hypertension: Secondary | ICD-10-CM

## 2013-11-04 NOTE — Telephone Encounter (Signed)
LMTCB

## 2013-11-05 NOTE — Telephone Encounter (Signed)
Returning call.

## 2013-11-05 NOTE — Telephone Encounter (Signed)
Pt returned call

## 2013-11-05 NOTE — Telephone Encounter (Signed)
Pt calling with list of medications that she is wanting refilled. I see in chart where on 10/16/13 a 1 month supply was sent to [facility], its looks as though it was sent to hold patient over until she saw her new PCP at her facility.  Doroteo Glassman, RN at 10/16/2013 10:13 AM     Spoke with pt and she gave me a list of what meds she needed refilled. Refills sent to [facility]. Pt states that she will be seeing a Dr at her facility to establish soon.   Pt states that she does have a new PCP now that is treating her and I explained to her that she needs to contact them and have them refill all her medications. Pt kept stating that we owe her a one month supply refill. I explained to her again that we have already refilled her medications for the one month (on 10/16/13) and that if she needs further refills she needs to be contacting her new PCP who is currently following her for these refills.  Pt states that she is going to contact her new PCP.  Nothing further needed.

## 2013-11-05 NOTE — Telephone Encounter (Signed)
lmomtcb x 2  

## 2013-11-26 ENCOUNTER — Telehealth: Payer: Self-pay | Admitting: Pulmonary Disease

## 2013-11-26 NOTE — Telephone Encounter (Signed)
Spoke with pt. Wanting to know if we have sent in any medication refills to [facility]. Advised her that the last fills we did on her medications was in 10/2013. [facility] stating that [facility] would be [facility] her a medication that was prescribed by [place]. Pt is going to [facility] and get some clarification on this. Nothing further is needed.

## 2013-12-03 DIAGNOSIS — G4700 Insomnia, unspecified: Secondary | ICD-10-CM | POA: Diagnosis not present

## 2013-12-03 DIAGNOSIS — F329 Major depressive disorder, single episode, unspecified: Secondary | ICD-10-CM | POA: Diagnosis not present

## 2013-12-03 DIAGNOSIS — I1310 Hypertensive heart and chronic kidney disease without heart failure, with stage 1 through stage 4 chronic kidney disease, or unspecified chronic kidney disease: Secondary | ICD-10-CM | POA: Diagnosis not present

## 2013-12-03 DIAGNOSIS — I6990 Unspecified sequelae of unspecified cerebrovascular disease: Secondary | ICD-10-CM | POA: Diagnosis not present

## 2013-12-03 DIAGNOSIS — M159 Polyosteoarthritis, unspecified: Secondary | ICD-10-CM | POA: Diagnosis not present

## 2013-12-03 DIAGNOSIS — H9190 Unspecified hearing loss, unspecified ear: Secondary | ICD-10-CM | POA: Diagnosis not present

## 2013-12-03 DIAGNOSIS — E039 Hypothyroidism, unspecified: Secondary | ICD-10-CM | POA: Diagnosis not present

## 2013-12-03 DIAGNOSIS — F411 Generalized anxiety disorder: Secondary | ICD-10-CM | POA: Diagnosis not present

## 2013-12-23 ENCOUNTER — Ambulatory Visit: Payer: Medicare Other | Admitting: Pulmonary Disease

## 2014-02-02 ENCOUNTER — Other Ambulatory Visit: Payer: Self-pay | Admitting: Pulmonary Disease

## 2014-02-06 ENCOUNTER — Other Ambulatory Visit: Payer: Self-pay | Admitting: Pulmonary Disease

## 2014-02-06 MED ORDER — RANITIDINE HCL 150 MG PO TABS: ORAL_TABLET | ORAL | Status: DC

## 2014-02-06 MED ORDER — FUROSEMIDE 20 MG PO TABS: ORAL_TABLET | ORAL | Status: DC

## 2014-02-21 ENCOUNTER — Encounter (HOSPITAL_COMMUNITY): Payer: Self-pay | Admitting: Emergency Medicine

## 2014-02-21 ENCOUNTER — Emergency Department (HOSPITAL_COMMUNITY): Payer: Medicare Other

## 2014-02-21 ENCOUNTER — Inpatient Hospital Stay (HOSPITAL_COMMUNITY)
Admission: EM | Admit: 2014-02-21 | Discharge: 2014-02-24 | DRG: 470 | Disposition: A | Discharge status: Skilled Nursing Facility | Payer: Medicare Other | Attending: Internal Medicine | Admitting: Internal Medicine

## 2014-02-21 DIAGNOSIS — I2699 Other pulmonary embolism without acute cor pulmonale: Secondary | ICD-10-CM

## 2014-02-21 DIAGNOSIS — I309 Acute pericarditis, unspecified: Secondary | ICD-10-CM

## 2014-02-21 DIAGNOSIS — I739 Peripheral vascular disease, unspecified: Secondary | ICD-10-CM | POA: Diagnosis present

## 2014-02-21 DIAGNOSIS — R9431 Abnormal electrocardiogram [ECG] [EKG]: Secondary | ICD-10-CM | POA: Diagnosis not present

## 2014-02-21 DIAGNOSIS — F329 Major depressive disorder, single episode, unspecified: Secondary | ICD-10-CM | POA: Diagnosis not present

## 2014-02-21 DIAGNOSIS — M1990 Unspecified osteoarthritis, unspecified site: Secondary | ICD-10-CM

## 2014-02-21 DIAGNOSIS — I5032 Chronic diastolic (congestive) heart failure: Secondary | ICD-10-CM | POA: Diagnosis not present

## 2014-02-21 DIAGNOSIS — F3289 Other specified depressive episodes: Secondary | ICD-10-CM | POA: Diagnosis not present

## 2014-02-21 DIAGNOSIS — I10 Essential (primary) hypertension: Secondary | ICD-10-CM | POA: Diagnosis not present

## 2014-02-21 DIAGNOSIS — S72002A Fracture of unspecified part of neck of left femur, initial encounter for closed fracture: Secondary | ICD-10-CM | POA: Diagnosis present

## 2014-02-21 DIAGNOSIS — S72033A Displaced midcervical fracture of unspecified femur, initial encounter for closed fracture: Secondary | ICD-10-CM | POA: Diagnosis not present

## 2014-02-21 DIAGNOSIS — F411 Generalized anxiety disorder: Secondary | ICD-10-CM

## 2014-02-21 DIAGNOSIS — I509 Heart failure, unspecified: Secondary | ICD-10-CM | POA: Diagnosis not present

## 2014-02-21 DIAGNOSIS — M899 Disorder of bone, unspecified: Secondary | ICD-10-CM

## 2014-02-21 DIAGNOSIS — Y9210 Unspecified residential institution as the place of occurrence of the external cause: Secondary | ICD-10-CM | POA: Diagnosis present

## 2014-02-21 DIAGNOSIS — W19XXXA Unspecified fall, initial encounter: Secondary | ICD-10-CM | POA: Diagnosis not present

## 2014-02-21 DIAGNOSIS — I447 Left bundle-branch block, unspecified: Secondary | ICD-10-CM | POA: Diagnosis present

## 2014-02-21 DIAGNOSIS — E039 Hypothyroidism, unspecified: Secondary | ICD-10-CM | POA: Diagnosis present

## 2014-02-21 DIAGNOSIS — K219 Gastro-esophageal reflux disease without esophagitis: Secondary | ICD-10-CM | POA: Diagnosis present

## 2014-02-21 DIAGNOSIS — K449 Diaphragmatic hernia without obstruction or gangrene: Secondary | ICD-10-CM | POA: Diagnosis present

## 2014-02-21 DIAGNOSIS — Z9181 History of falling: Secondary | ICD-10-CM | POA: Diagnosis not present

## 2014-02-21 DIAGNOSIS — Z96659 Presence of unspecified artificial knee joint: Secondary | ICD-10-CM

## 2014-02-21 DIAGNOSIS — M545 Low back pain, unspecified: Secondary | ICD-10-CM

## 2014-02-21 DIAGNOSIS — Z8673 Personal history of transient ischemic attack (TIA), and cerebral infarction without residual deficits: Secondary | ICD-10-CM

## 2014-02-21 DIAGNOSIS — K589 Irritable bowel syndrome without diarrhea: Secondary | ICD-10-CM

## 2014-02-21 DIAGNOSIS — I519 Heart disease, unspecified: Secondary | ICD-10-CM | POA: Diagnosis not present

## 2014-02-21 DIAGNOSIS — K2990 Gastroduodenitis, unspecified, without bleeding: Secondary | ICD-10-CM

## 2014-02-21 DIAGNOSIS — I369 Nonrheumatic tricuspid valve disorder, unspecified: Secondary | ICD-10-CM | POA: Diagnosis not present

## 2014-02-21 DIAGNOSIS — G459 Transient cerebral ischemic attack, unspecified: Secondary | ICD-10-CM

## 2014-02-21 DIAGNOSIS — M6281 Muscle weakness (generalized): Secondary | ICD-10-CM | POA: Diagnosis not present

## 2014-02-21 DIAGNOSIS — K2970 Gastritis, unspecified, without bleeding: Secondary | ICD-10-CM | POA: Diagnosis present

## 2014-02-21 DIAGNOSIS — C50919 Malignant neoplasm of unspecified site of unspecified female breast: Secondary | ICD-10-CM

## 2014-02-21 DIAGNOSIS — M797 Fibromyalgia: Secondary | ICD-10-CM

## 2014-02-21 DIAGNOSIS — J449 Chronic obstructive pulmonary disease, unspecified: Secondary | ICD-10-CM | POA: Diagnosis present

## 2014-02-21 DIAGNOSIS — Z66 Do not resuscitate: Secondary | ICD-10-CM | POA: Diagnosis present

## 2014-02-21 DIAGNOSIS — M949 Disorder of cartilage, unspecified: Secondary | ICD-10-CM

## 2014-02-21 DIAGNOSIS — M25559 Pain in unspecified hip: Secondary | ICD-10-CM | POA: Diagnosis not present

## 2014-02-21 DIAGNOSIS — J4489 Other specified chronic obstructive pulmonary disease: Secondary | ICD-10-CM | POA: Diagnosis present

## 2014-02-21 DIAGNOSIS — R269 Unspecified abnormalities of gait and mobility: Secondary | ICD-10-CM | POA: Diagnosis not present

## 2014-02-21 DIAGNOSIS — Z96649 Presence of unspecified artificial hip joint: Secondary | ICD-10-CM | POA: Diagnosis not present

## 2014-02-21 DIAGNOSIS — I872 Venous insufficiency (chronic) (peripheral): Secondary | ICD-10-CM

## 2014-02-21 DIAGNOSIS — S72142A Displaced intertrochanteric fracture of left femur, initial encounter for closed fracture: Secondary | ICD-10-CM

## 2014-02-21 DIAGNOSIS — Z471 Aftercare following joint replacement surgery: Secondary | ICD-10-CM | POA: Diagnosis not present

## 2014-02-21 DIAGNOSIS — S72009A Fracture of unspecified part of neck of unspecified femur, initial encounter for closed fracture: Secondary | ICD-10-CM | POA: Diagnosis not present

## 2014-02-21 DIAGNOSIS — S72009D Fracture of unspecified part of neck of unspecified femur, subsequent encounter for closed fracture with routine healing: Secondary | ICD-10-CM | POA: Diagnosis not present

## 2014-02-21 DIAGNOSIS — I5030 Unspecified diastolic (congestive) heart failure: Secondary | ICD-10-CM | POA: Diagnosis not present

## 2014-02-21 LAB — COMPREHENSIVE METABOLIC PANEL
ALK PHOS: 78 U/L (ref 39–117)
ALT: 21 U/L (ref 0–35)
ALT: 21 U/L (ref 0–35)
ANION GAP: 14 (ref 5–15)
AST: 31 U/L (ref 0–37)
AST: 30 U/L (ref 0–37)
Albumin: 3.9 g/dL (ref 3.5–5.2)
Albumin: 4.1 g/dL (ref 3.5–5.2)
Alkaline Phosphatase: 84 U/L (ref 39–117)
Anion gap: 12 (ref 5–15)
BUN: 17 mg/dL (ref 6–23)
BUN: 16 mg/dL (ref 6–23)
CALCIUM: 10.3 mg/dL (ref 8.4–10.5)
CALCIUM: 10.6 mg/dL — AB (ref 8.4–10.5)
CO2: 27 meq/L (ref 19–32)
CO2: 26 meq/L (ref 19–32)
Chloride: 99 mEq/L (ref 96–112)
Chloride: 102 mEq/L (ref 96–112)
Creatinine, Ser: 0.92 mg/dL (ref 0.50–1.10)
Creatinine, Ser: 0.9 mg/dL (ref 0.50–1.10)
GFR calc non Af Amer: 55 mL/min — ABNORMAL LOW (ref >90)
GFR, EST AFRICAN AMERICAN: 64 mL/min — AB (ref >90)
GFR, EST AFRICAN AMERICAN: 65 mL/min — AB (ref >90)
GFR, EST NON AFRICAN AMERICAN: 56 mL/min — AB (ref >90)
GLUCOSE: 115 mg/dL — AB (ref 70–99)
GLUCOSE: 126 mg/dL — AB (ref 70–99)
Potassium: 4.3 mEq/L (ref 3.7–5.3)
Potassium: 5.1 mEq/L (ref 3.7–5.3)
Sodium: 138 mEq/L (ref 137–147)
Sodium: 142 mEq/L (ref 137–147)
TOTAL PROTEIN: 7.7 g/dL (ref 6.0–8.3)
Total Bilirubin: 0.4 mg/dL (ref 0.3–1.2)
Total Bilirubin: 0.6 mg/dL (ref 0.3–1.2)
Total Protein: 7.3 g/dL (ref 6.0–8.3)

## 2014-02-21 LAB — CBC WITH DIFFERENTIAL/PLATELET
BASOS PCT: 0 % (ref 0–1)
Basophils Absolute: 0 10*3/uL (ref 0.0–0.1)
EOS ABS: 0.1 10*3/uL (ref 0.0–0.7)
EOS PCT: 1 % (ref 0–5)
HCT: 50 % — ABNORMAL HIGH (ref 36.0–46.0)
Hemoglobin: 17.1 g/dL — ABNORMAL HIGH (ref 12.0–15.0)
Lymphocytes Relative: 21 % (ref 12–46)
Lymphs Abs: 1.8 10*3/uL (ref 0.7–4.0)
MCH: 32.3 pg (ref 26.0–34.0)
MCHC: 34.2 g/dL (ref 30.0–36.0)
MCV: 94.5 fL (ref 78.0–100.0)
MONOS PCT: 7 % (ref 3–12)
Monocytes Absolute: 0.6 10*3/uL (ref 0.1–1.0)
NEUTROS PCT: 71 % (ref 43–77)
Neutro Abs: 6.2 10*3/uL (ref 1.7–7.7)
PLATELETS: 163 10*3/uL (ref 150–400)
RBC: 5.29 MIL/uL — ABNORMAL HIGH (ref 3.87–5.11)
RDW: 13.3 % (ref 11.5–15.5)
WBC: 8.7 10*3/uL (ref 4.0–10.5)

## 2014-02-21 LAB — MAGNESIUM: Magnesium: 2.3 mg/dL (ref 1.5–2.5)

## 2014-02-21 LAB — TYPE AND SCREEN
ABO/RH(D): A NEG
Antibody Screen: NEGATIVE

## 2014-02-21 LAB — ABO/RH: ABO/RH(D): A NEG

## 2014-02-21 LAB — CBC
HCT: 51 % — ABNORMAL HIGH (ref 36.0–46.0)
HEMOGLOBIN: 17 g/dL — AB (ref 12.0–15.0)
MCH: 31.6 pg (ref 26.0–34.0)
MCHC: 33.3 g/dL (ref 30.0–36.0)
MCV: 94.8 fL (ref 78.0–100.0)
PLATELETS: 153 10*3/uL (ref 150–400)
RBC: 5.38 MIL/uL — ABNORMAL HIGH (ref 3.87–5.11)
RDW: 13.3 % (ref 11.5–15.5)
WBC: 12.7 10*3/uL — ABNORMAL HIGH (ref 4.0–10.5)

## 2014-02-21 LAB — PROTIME-INR
INR: 0.99 (ref 0.00–1.49)
INR: 1 (ref 0.00–1.49)
Prothrombin Time: 13.1 seconds (ref 11.6–15.2)
Prothrombin Time: 13.2 seconds (ref 11.6–15.2)

## 2014-02-21 LAB — MRSA PCR SCREENING: MRSA BY PCR: NEGATIVE

## 2014-02-21 LAB — APTT: aPTT: 36 seconds (ref 24–37)

## 2014-02-21 LAB — PHOSPHORUS: PHOSPHORUS: 3.1 mg/dL (ref 2.3–4.6)

## 2014-02-21 MED ORDER — ACETAMINOPHEN 650 MG RE SUPP: 650.0000 mg | Freq: Four times a day (QID) | RECTAL | Status: DC | PRN

## 2014-02-21 MED ORDER — HYDRALAZINE HCL 20 MG/ML IJ SOLN
10.0000 mg | Freq: Four times a day (QID) | INTRAMUSCULAR | Status: DC
  Administered 2014-02-21 – 2014-02-24 (×5): 10 mg via INTRAVENOUS
  Filled 2014-02-21: qty 0.5
  Filled 2014-02-21: qty 1
  Filled 2014-02-21 (×3): qty 0.5
  Filled 2014-02-21: qty 1
  Filled 2014-02-21 (×5): qty 0.5
  Filled 2014-02-21: qty 1
  Filled 2014-02-21 (×3): qty 0.5

## 2014-02-21 MED ORDER — ONDANSETRON HCL 4 MG/2ML IJ SOLN
4.0000 mg | Freq: Once | INTRAMUSCULAR | Status: AC
  Administered 2014-02-21: 4 mg via INTRAVENOUS
  Filled 2014-02-21: qty 2

## 2014-02-21 MED ORDER — LEVOTHYROXINE SODIUM 100 MCG PO TABS
100.0000 ug | ORAL_TABLET | Freq: Every day | ORAL | Status: DC
  Administered 2014-02-23 – 2014-02-24 (×2): 100 ug via ORAL
  Filled 2014-02-21 (×4): qty 1

## 2014-02-21 MED ORDER — DICLOFENAC SODIUM 1 % TD GEL
2.0000 g | Freq: Two times a day (BID) | TRANSDERMAL | Status: DC
  Administered 2014-02-21: 2 g via TOPICAL
  Filled 2014-02-21: qty 100

## 2014-02-21 MED ORDER — POTASSIUM CHLORIDE CRYS ER 20 MEQ PO TBCR
20.0000 meq | EXTENDED_RELEASE_TABLET | Freq: Every day | ORAL | Status: DC
  Administered 2014-02-23 – 2014-02-24 (×2): 20 meq via ORAL
  Filled 2014-02-21 (×3): qty 1

## 2014-02-21 MED ORDER — LORAZEPAM 1 MG PO TABS
1.0000 mg | ORAL_TABLET | Freq: Four times a day (QID) | ORAL | Status: DC | PRN
  Administered 2014-02-21 – 2014-02-23 (×2): 1 mg via ORAL
  Filled 2014-02-21 (×2): qty 1

## 2014-02-21 MED ORDER — SODIUM CHLORIDE 0.9 % IV SOLN
INTRAVENOUS | Status: DC
  Administered 2014-02-21: 21:00:00 via INTRAVENOUS

## 2014-02-21 MED ORDER — IMIPRAMINE HCL 50 MG PO TABS
50.0000 mg | ORAL_TABLET | Freq: Every day | ORAL | Status: DC
  Administered 2014-02-21 – 2014-02-23 (×3): 50 mg via ORAL
  Filled 2014-02-21 (×4): qty 1

## 2014-02-21 MED ORDER — MORPHINE SULFATE 4 MG/ML IJ SOLN
4.0000 mg | INTRAMUSCULAR | Status: DC | PRN
  Administered 2014-02-21: 4 mg via INTRAVENOUS
  Filled 2014-02-21: qty 1

## 2014-02-21 MED ORDER — ADULT MULTIVITAMIN W/MINERALS CH
1.0000 | ORAL_TABLET | Freq: Every day | ORAL | Status: DC
  Administered 2014-02-23 – 2014-02-24 (×2): 1 via ORAL
  Filled 2014-02-21 (×3): qty 1

## 2014-02-21 MED ORDER — ISOSORBIDE MONONITRATE ER 30 MG PO TB24
30.0000 mg | ORAL_TABLET | Freq: Every day | ORAL | Status: DC
  Administered 2014-02-23 – 2014-02-24 (×2): 30 mg via ORAL
  Filled 2014-02-21 (×3): qty 1

## 2014-02-21 MED ORDER — ZOLPIDEM TARTRATE 5 MG PO TABS
5.0000 mg | ORAL_TABLET | Freq: Every evening | ORAL | Status: DC | PRN
  Administered 2014-02-21 – 2014-02-23 (×3): 5 mg via ORAL
  Filled 2014-02-21 (×3): qty 1

## 2014-02-21 MED ORDER — FENTANYL CITRATE 0.05 MG/ML IJ SOLN
50.0000 ug | INTRAMUSCULAR | Status: DC | PRN
  Administered 2014-02-21 – 2014-02-23 (×8): 50 ug via INTRAVENOUS
  Filled 2014-02-21 (×8): qty 2

## 2014-02-21 MED ORDER — ACETAMINOPHEN 325 MG PO TABS
650.0000 mg | ORAL_TABLET | Freq: Four times a day (QID) | ORAL | Status: DC | PRN
Start: 2014-02-21 — End: 2014-02-24

## 2014-02-21 MED ORDER — HYDROCODONE-ACETAMINOPHEN 5-325 MG PO TABS
1.0000 | ORAL_TABLET | ORAL | Status: AC | PRN
  Administered 2014-02-21: 2 via ORAL
  Filled 2014-02-21: qty 2

## 2014-02-21 MED ORDER — FUROSEMIDE 40 MG PO TABS
40.0000 mg | ORAL_TABLET | Freq: Two times a day (BID) | ORAL | Status: DC
  Administered 2014-02-22 – 2014-02-24 (×4): 40 mg via ORAL
  Filled 2014-02-21 (×7): qty 1

## 2014-02-21 MED ORDER — ONE-DAILY MULTI VITAMINS PO TABS: 2.0000 | ORAL_TABLET | Freq: Every day | ORAL | Status: DC

## 2014-02-21 MED ORDER — FAMOTIDINE 20 MG PO TABS
20.0000 mg | ORAL_TABLET | Freq: Every day | ORAL | Status: DC
  Filled 2014-02-21: qty 1

## 2014-02-21 MED ORDER — FAMOTIDINE 20 MG PO TABS
20.0000 mg | ORAL_TABLET | Freq: Every day | ORAL | Status: DC
  Administered 2014-02-21 – 2014-02-23 (×3): 20 mg via ORAL
  Filled 2014-02-21 (×4): qty 1

## 2014-02-21 MED ORDER — PANTOPRAZOLE SODIUM 40 MG PO TBEC
40.0000 mg | DELAYED_RELEASE_TABLET | Freq: Every day | ORAL | Status: DC
  Administered 2014-02-23 – 2014-02-24 (×2): 40 mg via ORAL
  Filled 2014-02-21 (×4): qty 1

## 2014-02-21 MED ORDER — FENTANYL CITRATE 0.05 MG/ML IJ SOLN: 50.0000 ug | INTRAMUSCULAR | Status: DC | PRN

## 2014-02-21 MED ORDER — ONDANSETRON HCL 4 MG/2ML IJ SOLN: 4.0000 mg | Freq: Four times a day (QID) | INTRAMUSCULAR | Status: DC | PRN

## 2014-02-21 MED ORDER — GABAPENTIN 100 MG PO CAPS
100.0000 mg | ORAL_CAPSULE | Freq: Every day | ORAL | Status: DC
  Administered 2014-02-21 – 2014-02-23 (×3): 100 mg via ORAL
  Filled 2014-02-21 (×4): qty 1

## 2014-02-21 MED ORDER — ONDANSETRON HCL 4 MG PO TABS: 4.0000 mg | ORAL_TABLET | Freq: Four times a day (QID) | ORAL | Status: DC | PRN

## 2014-02-21 MED ORDER — METOPROLOL TARTRATE 25 MG PO TABS
25.0000 mg | ORAL_TABLET | Freq: Two times a day (BID) | ORAL | Status: DC
  Administered 2014-02-21 – 2014-02-24 (×6): 25 mg via ORAL
  Filled 2014-02-21 (×7): qty 1

## 2014-02-21 MED ORDER — ONDANSETRON HCL 4 MG/2ML IJ SOLN
4.0000 mg | Freq: Three times a day (TID) | INTRAMUSCULAR | Status: AC | PRN
  Administered 2014-02-21: 4 mg via INTRAVENOUS
  Filled 2014-02-21: qty 2

## 2014-02-21 MED ORDER — ZOLPIDEM TARTRATE 10 MG PO TABS: 10.0000 mg | ORAL_TABLET | Freq: Every evening | ORAL | Status: DC | PRN
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
## 2014-02-21 NOTE — ED Provider Notes (Signed)
Pt seen and evaluated.  Discussed with PA Sanders.  Pt with Lt hip pain and tenderness, shortening, and rotation.  Xray shows LT IT Hip fracture.  EKG with LBBB.  Pre-op studies, Hospitalist, and Ortho consult pending. Findings discussed with Pt and Daughter (POA).  Tanna Furry, MD 02/21/14 1730

## 2014-02-21 NOTE — ED Notes (Signed)
Pt given swabs and ice water to keep mouth moist @ 16:30
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
## 2014-02-21 NOTE — ED Notes (Signed)
Bed: WA02 Expected date:  Expected time:  Means of arrival:  Comments: ems- fall, left hip pain

## 2014-02-21 NOTE — ED Notes (Signed)
1st attempt to give report to 6E, unable to take report due to shift change. They will call back. Agricultural consultant notified.
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
## 2014-02-21 NOTE — ED Notes (Signed)
Pt's daughter/ POADorothe Pea [phone] Please call with updates

## 2014-02-22 ENCOUNTER — Encounter (HOSPITAL_COMMUNITY): Payer: Medicare Other | Admitting: Registered Nurse

## 2014-02-22 ENCOUNTER — Encounter (HOSPITAL_COMMUNITY): Payer: Self-pay | Admitting: Registered Nurse

## 2014-02-22 ENCOUNTER — Encounter (HOSPITAL_COMMUNITY)
Admission: EM | Disposition: A | Discharge status: Skilled Nursing Facility | Payer: Self-pay | Source: Home / Self Care | Attending: Internal Medicine

## 2014-02-22 ENCOUNTER — Inpatient Hospital Stay (HOSPITAL_COMMUNITY): Payer: Medicare Other | Admitting: Registered Nurse

## 2014-02-22 ENCOUNTER — Inpatient Hospital Stay (HOSPITAL_COMMUNITY): Payer: Medicare Other

## 2014-02-22 DIAGNOSIS — S72009A Fracture of unspecified part of neck of unspecified femur, initial encounter for closed fracture: Secondary | ICD-10-CM | POA: Diagnosis present

## 2014-02-22 DIAGNOSIS — I369 Nonrheumatic tricuspid valve disorder, unspecified: Secondary | ICD-10-CM

## 2014-02-22 DIAGNOSIS — E039 Hypothyroidism, unspecified: Secondary | ICD-10-CM

## 2014-02-22 DIAGNOSIS — I519 Heart disease, unspecified: Secondary | ICD-10-CM

## 2014-02-22 HISTORY — PX: HIP ARTHROPLASTY: SHX981

## 2014-02-22 LAB — COMPREHENSIVE METABOLIC PANEL
ALBUMIN: 3.2 g/dL — AB (ref 3.5–5.2)
ALT: 17 U/L (ref 0–35)
ANION GAP: 10 (ref 5–15)
AST: 27 U/L (ref 0–37)
Alkaline Phosphatase: 65 U/L (ref 39–117)
BILIRUBIN TOTAL: 0.7 mg/dL (ref 0.3–1.2)
BUN: 16 mg/dL (ref 6–23)
CO2: 26 meq/L (ref 19–32)
CREATININE: 1.09 mg/dL (ref 0.50–1.10)
Calcium: 9.6 mg/dL (ref 8.4–10.5)
Chloride: 104 mEq/L (ref 96–112)
GFR calc Af Amer: 52 mL/min — ABNORMAL LOW (ref >90)
GFR, EST NON AFRICAN AMERICAN: 45 mL/min — AB (ref >90)
Glucose, Bld: 139 mg/dL — ABNORMAL HIGH (ref 70–99)
Potassium: 4.8 mEq/L (ref 3.7–5.3)
Sodium: 140 mEq/L (ref 137–147)
Total Protein: 6.4 g/dL (ref 6.0–8.3)

## 2014-02-22 LAB — GLUCOSE, CAPILLARY: Glucose-Capillary: 134 mg/dL — ABNORMAL HIGH (ref 70–99)

## 2014-02-22 LAB — PROTIME-INR
INR: 1.08 (ref 0.00–1.49)
Prothrombin Time: 14 seconds (ref 11.6–15.2)

## 2014-02-22 LAB — APTT: APTT: 35 s (ref 24–37)

## 2014-02-22 LAB — CBC
HCT: 48 % — ABNORMAL HIGH (ref 36.0–46.0)
Hemoglobin: 16 g/dL — ABNORMAL HIGH (ref 12.0–15.0)
MCH: 31.9 pg (ref 26.0–34.0)
MCHC: 33.3 g/dL (ref 30.0–36.0)
MCV: 95.8 fL (ref 78.0–100.0)
PLATELETS: 147 10*3/uL — AB (ref 150–400)
RBC: 5.01 MIL/uL (ref 3.87–5.11)
RDW: 13.6 % (ref 11.5–15.5)
WBC: 11.2 10*3/uL — AB (ref 4.0–10.5)

## 2014-02-22 LAB — TSH: TSH: 4.69 u[IU]/mL — AB (ref 0.350–4.500)

## 2014-02-22 SURGERY — HEMIARTHROPLASTY, HIP, DIRECT ANTERIOR APPROACH, FOR FRACTURE
Anesthesia: General | Site: Hip | Laterality: Left

## 2014-02-22 MED ORDER — NEOSTIGMINE METHYLSULFATE 10 MG/10ML IV SOLN
INTRAVENOUS | Status: DC | PRN
  Administered 2014-02-22: 4 mg via INTRAVENOUS

## 2014-02-22 MED ORDER — FENTANYL CITRATE 0.05 MG/ML IJ SOLN
INTRAMUSCULAR | Status: DC | PRN
  Administered 2014-02-22 (×2): 50 ug via INTRAVENOUS
  Administered 2014-02-22: 100 ug via INTRAVENOUS
  Administered 2014-02-22: 50 ug via INTRAVENOUS

## 2014-02-22 MED ORDER — CEFAZOLIN SODIUM-DEXTROSE 2-3 GM-% IV SOLR
2.0000 g | Freq: Three times a day (TID) | INTRAVENOUS | Status: AC
  Administered 2014-02-22 – 2014-02-23 (×2): 2 g via INTRAVENOUS
  Filled 2014-02-22 (×2): qty 50

## 2014-02-22 MED ORDER — MEPERIDINE HCL 50 MG/ML IJ SOLN: 6.2500 mg | INTRAMUSCULAR | Status: DC | PRN

## 2014-02-22 MED ORDER — NEOSTIGMINE METHYLSULFATE 10 MG/10ML IV SOLN
INTRAVENOUS | Status: AC
  Filled 2014-02-22: qty 1

## 2014-02-22 MED ORDER — FENTANYL CITRATE 0.05 MG/ML IJ SOLN: 25.0000 ug | INTRAMUSCULAR | Status: DC | PRN

## 2014-02-22 MED ORDER — DEXAMETHASONE SODIUM PHOSPHATE 10 MG/ML IJ SOLN
INTRAMUSCULAR | Status: AC
  Filled 2014-02-22: qty 1

## 2014-02-22 MED ORDER — PROPOFOL 10 MG/ML IV BOLUS
INTRAVENOUS | Status: AC
  Filled 2014-02-22: qty 20

## 2014-02-22 MED ORDER — ROCURONIUM BROMIDE 100 MG/10ML IV SOLN
INTRAVENOUS | Status: AC
  Filled 2014-02-22: qty 1

## 2014-02-22 MED ORDER — STERILE WATER FOR IRRIGATION IR SOLN
Status: DC | PRN
  Administered 2014-02-22: 1500 mL

## 2014-02-22 MED ORDER — PHENYLEPHRINE HCL 10 MG/ML IJ SOLN
INTRAMUSCULAR | Status: DC | PRN
  Administered 2014-02-22 (×2): 80 ug via INTRAVENOUS

## 2014-02-22 MED ORDER — GLYCOPYRROLATE 0.2 MG/ML IJ SOLN
INTRAMUSCULAR | Status: DC | PRN
  Administered 2014-02-22: .6 mg via INTRAVENOUS

## 2014-02-22 MED ORDER — ONDANSETRON HCL 4 MG/2ML IJ SOLN: 4.0000 mg | Freq: Four times a day (QID) | INTRAMUSCULAR | Status: DC | PRN

## 2014-02-22 MED ORDER — 0.9 % SODIUM CHLORIDE (POUR BTL) OPTIME
TOPICAL | Status: DC | PRN
  Administered 2014-02-22: 1000 mL

## 2014-02-22 MED ORDER — METOCLOPRAMIDE HCL 5 MG/ML IJ SOLN: 5.0000 mg | Freq: Three times a day (TID) | INTRAMUSCULAR | Status: DC | PRN

## 2014-02-22 MED ORDER — TRAMADOL HCL 50 MG PO TABS
50.0000 mg | ORAL_TABLET | Freq: Four times a day (QID) | ORAL | Status: DC | PRN
  Administered 2014-02-24: 50 mg via ORAL
  Filled 2014-02-22: qty 1

## 2014-02-22 MED ORDER — ONDANSETRON HCL 4 MG/2ML IJ SOLN
INTRAMUSCULAR | Status: AC
  Filled 2014-02-22: qty 2

## 2014-02-22 MED ORDER — DEXAMETHASONE SODIUM PHOSPHATE 10 MG/ML IJ SOLN
INTRAMUSCULAR | Status: DC | PRN
  Administered 2014-02-22: 10 mg via INTRAVENOUS

## 2014-02-22 MED ORDER — DOCUSATE SODIUM 100 MG PO CAPS
100.0000 mg | ORAL_CAPSULE | Freq: Two times a day (BID) | ORAL | Status: DC
  Administered 2014-02-22 – 2014-02-24 (×4): 100 mg via ORAL
  Filled 2014-02-22 (×5): qty 1

## 2014-02-22 MED ORDER — LACTATED RINGERS IV SOLN
INTRAVENOUS | Status: DC | PRN
  Administered 2014-02-22: 12:00:00 via INTRAVENOUS

## 2014-02-22 MED ORDER — POTASSIUM CHLORIDE IN NACL 20-0.9 MEQ/L-% IV SOLN
INTRAVENOUS | Status: DC
  Administered 2014-02-22 – 2014-02-23 (×2): via INTRAVENOUS
  Filled 2014-02-22 (×3): qty 1000

## 2014-02-22 MED ORDER — ROCURONIUM BROMIDE 100 MG/10ML IV SOLN
INTRAVENOUS | Status: DC | PRN
  Administered 2014-02-22: 30 mg via INTRAVENOUS

## 2014-02-22 MED ORDER — DEXTROSE 5 % IV SOLN
500.0000 mg | Freq: Four times a day (QID) | INTRAVENOUS | Status: DC | PRN
  Filled 2014-02-22: qty 5

## 2014-02-22 MED ORDER — ONDANSETRON HCL 4 MG/2ML IJ SOLN
INTRAMUSCULAR | Status: DC | PRN
  Administered 2014-02-22: 4 mg via INTRAVENOUS

## 2014-02-22 MED ORDER — CEFAZOLIN SODIUM-DEXTROSE 2-3 GM-% IV SOLR
2.0000 g | Freq: Once | INTRAVENOUS | Status: AC
  Administered 2014-02-22: 2 g via INTRAVENOUS

## 2014-02-22 MED ORDER — FENTANYL CITRATE 0.05 MG/ML IJ SOLN
INTRAMUSCULAR | Status: AC
  Filled 2014-02-22: qty 5

## 2014-02-22 MED ORDER — PROMETHAZINE HCL 25 MG/ML IJ SOLN: 6.2500 mg | INTRAMUSCULAR | Status: DC | PRN

## 2014-02-22 MED ORDER — ACETAMINOPHEN 650 MG RE SUPP: 650.0000 mg | Freq: Four times a day (QID) | RECTAL | Status: DC | PRN

## 2014-02-22 MED ORDER — METHOCARBAMOL 500 MG PO TABS
500.0000 mg | ORAL_TABLET | Freq: Four times a day (QID) | ORAL | Status: DC | PRN
Start: 2014-02-22 — End: 2014-02-24
  Administered 2014-02-23: 500 mg via ORAL
  Filled 2014-02-22: qty 1

## 2014-02-22 MED ORDER — POTASSIUM CHLORIDE IN NACL 20-0.9 MEQ/L-% IV SOLN
INTRAVENOUS | Status: AC
  Filled 2014-02-22: qty 1000

## 2014-02-22 MED ORDER — ACETAMINOPHEN 10 MG/ML IV SOLN
1000.0000 mg | Freq: Once | INTRAVENOUS | Status: DC | PRN
  Filled 2014-02-22: qty 100

## 2014-02-22 MED ORDER — GLYCOPYRROLATE 0.2 MG/ML IJ SOLN
INTRAMUSCULAR | Status: AC
  Filled 2014-02-22: qty 4

## 2014-02-22 MED ORDER — MENTHOL 3 MG MT LOZG
1.0000 | LOZENGE | OROMUCOSAL | Status: DC | PRN
  Filled 2014-02-22: qty 9

## 2014-02-22 MED ORDER — PROPOFOL 10 MG/ML IV BOLUS
INTRAVENOUS | Status: DC | PRN
  Administered 2014-02-22: 100 mg via INTRAVENOUS

## 2014-02-22 MED ORDER — EPHEDRINE SULFATE 50 MG/ML IJ SOLN
INTRAMUSCULAR | Status: DC | PRN
  Administered 2014-02-22: 10 mg via INTRAVENOUS
  Administered 2014-02-22: 5 mg via INTRAVENOUS

## 2014-02-22 MED ORDER — CEFAZOLIN SODIUM-DEXTROSE 2-3 GM-% IV SOLR
INTRAVENOUS | Status: AC
  Filled 2014-02-22: qty 50

## 2014-02-22 MED ORDER — PHENYLEPHRINE HCL 10 MG/ML IJ SOLN
20.0000 mg | INTRAVENOUS | Status: DC | PRN
  Administered 2014-02-22: 15 ug/min via INTRAVENOUS

## 2014-02-22 MED ORDER — ONDANSETRON HCL 4 MG PO TABS: 4.0000 mg | ORAL_TABLET | Freq: Four times a day (QID) | ORAL | Status: DC | PRN

## 2014-02-22 MED ORDER — ASPIRIN EC 325 MG PO TBEC
325.0000 mg | DELAYED_RELEASE_TABLET | Freq: Every day | ORAL | Status: DC
  Administered 2014-02-23 – 2014-02-24 (×2): 325 mg via ORAL
  Filled 2014-02-22 (×4): qty 1

## 2014-02-22 MED ORDER — LIDOCAINE HCL (CARDIAC) 20 MG/ML IV SOLN
INTRAVENOUS | Status: DC | PRN
  Administered 2014-02-22: 80 mg via INTRAVENOUS

## 2014-02-22 MED ORDER — METOCLOPRAMIDE HCL 10 MG PO TABS: 5.0000 mg | ORAL_TABLET | Freq: Three times a day (TID) | ORAL | Status: DC | PRN

## 2014-02-22 MED ORDER — LIDOCAINE HCL (CARDIAC) 20 MG/ML IV SOLN
INTRAVENOUS | Status: AC
  Filled 2014-02-22: qty 5

## 2014-02-22 MED ORDER — ACETAMINOPHEN 325 MG PO TABS: 650.0000 mg | ORAL_TABLET | Freq: Four times a day (QID) | ORAL | Status: DC | PRN

## 2014-02-22 MED ORDER — PHENOL 1.4 % MT LIQD
1.0000 | OROMUCOSAL | Status: DC | PRN
  Filled 2014-02-22: qty 177

## 2014-02-22 SURGICAL SUPPLY — 48 items
BAG SPEC THK2 15X12 ZIP CLS (MISCELLANEOUS) ×1
BAG ZIPLOCK 12X15 (MISCELLANEOUS) ×3 IMPLANT
BLADE SAW SAG 73X25 THK (BLADE) ×2
BLADE SAW SGTL 73X25 THK (BLADE) ×1 IMPLANT
BRUSH FEMORAL CANAL (MISCELLANEOUS) ×3 IMPLANT
CAPT HIP HD POR BIPOL/UNIPOL ×3 IMPLANT
DRAPE INCISE IOBAN 66X45 STRL (DRAPES) ×3 IMPLANT
DRAPE ORTHO SPLIT 77X108 STRL (DRAPES) ×6
DRAPE POUCH INSTRU U-SHP 10X18 (DRAPES) ×3 IMPLANT
DRAPE SURG 17X11 SM STRL (DRAPES) ×3 IMPLANT
DRAPE SURG ORHT 6 SPLT 77X108 (DRAPES) ×2 IMPLANT
DRAPE U-SHAPE 47X51 STRL (DRAPES) ×3 IMPLANT
DRSG EMULSION OIL 3X16 NADH (GAUZE/BANDAGES/DRESSINGS) ×3 IMPLANT
DRSG MEPILEX BORDER 4X12 (GAUZE/BANDAGES/DRESSINGS) ×2 IMPLANT
DRSG MEPILEX BORDER 4X8 (GAUZE/BANDAGES/DRESSINGS) ×6 IMPLANT
DRSG PAD ABDOMINAL 8X10 ST (GAUZE/BANDAGES/DRESSINGS) ×3 IMPLANT
DURAPREP 26ML APPLICATOR (WOUND CARE) ×3 IMPLANT
ELECT REM PT RETURN 9FT ADLT (ELECTROSURGICAL) ×3
ELECTRODE REM PT RTRN 9FT ADLT (ELECTROSURGICAL) ×1 IMPLANT
EVACUATOR 1/8 PVC DRAIN (DRAIN) ×3 IMPLANT
GAUZE SPONGE 4X4 12PLY STRL (GAUZE/BANDAGES/DRESSINGS) ×6 IMPLANT
GAUZE XEROFORM 5X9 LF (GAUZE/BANDAGES/DRESSINGS) ×2 IMPLANT
GLOVE BIOGEL M 8.0 STRL (GLOVE) ×3 IMPLANT
GLOVE SURG ORTHO 8.0 STRL STRW (GLOVE) ×3 IMPLANT
GLOVE SURG ORTHO 9.0 STRL STRW (GLOVE) ×6 IMPLANT
GOWN STRL REUS W/TWL LRG LVL3 (GOWN DISPOSABLE) ×3 IMPLANT
HANDPIECE INTERPULSE COAX TIP (DISPOSABLE) ×3
HOOD PEEL AWAY FACE SHEILD DIS (HOOD) ×9 IMPLANT
IMMOBILIZER KNEE 20 (SOFTGOODS) ×3
IMMOBILIZER KNEE 20 THIGH 36 (SOFTGOODS) IMPLANT
KIT BASIN OR (CUSTOM PROCEDURE TRAY) ×3 IMPLANT
NEEDLE MAYO .5 CIRCLE (NEEDLE) ×6 IMPLANT
PACK TOTAL JOINT (CUSTOM PROCEDURE TRAY) ×3 IMPLANT
PASSER SUT SWANSON 36MM LOOP (INSTRUMENTS) ×3 IMPLANT
POSITIONER SURGICAL ARM (MISCELLANEOUS) ×3 IMPLANT
SET HNDPC FAN SPRY TIP SCT (DISPOSABLE) ×1 IMPLANT
SPONGE LAP 18X18 X RAY DECT (DISPOSABLE) ×2 IMPLANT
STAPLER VISISTAT 35W (STAPLE) ×3 IMPLANT
SUT ETHIBOND NAB CT1 #1 30IN (SUTURE) ×9 IMPLANT
SUT PDS AB 1 CT1 27 (SUTURE) ×6 IMPLANT
SUT VIC AB 0 CT1 27 (SUTURE) ×3
SUT VIC AB 0 CT1 27XBRD ANTBC (SUTURE) IMPLANT
SUT VIC AB 0 CT1 36 (SUTURE) ×3 IMPLANT
SUT VIC AB 1 CT1 36 (SUTURE) ×12 IMPLANT
SUT VIC AB 2-0 CT1 27 (SUTURE) ×6
SUT VIC AB 2-0 CT1 TAPERPNT 27 (SUTURE) ×2 IMPLANT
TOWEL OR 17X26 10 PK STRL BLUE (TOWEL DISPOSABLE) ×9 IMPLANT
TOWER CARTRIDGE SMART MIX (DISPOSABLE) IMPLANT

## 2014-02-22 NOTE — Progress Notes (Signed)
[facility] PROGRESS NOTE   [name] GLO:[id number] DOB: 1924-12-20 DOA: 02/21/2014 PCP: Reymundo Poll, MD  HPI/Subjective: Complains about some pain in the left hip.  Assessment/Plan: Principal Problem:   Fracture of femoral neck, left Active Problems:   HYPOTHYROIDISM   ANXIETY   HYPERTENSION   Diastolic dysfunction, left ventricle as evidenced by moderate LVH on echocardiogram June 2013   GERD   GASTRITIS   Fall   Fracture of femoral neck, left  Status post mechanical fall  Appreciate orthopedic surgery consult and recommendations.  Continue pain control with narcotics, start DVT prophylaxis post surgery. Active Problems:  HYPERTENSION / Accelerated hypertension  Restart home meds: Imdur, Lasix, Metoprolol  Added hydralazine 10 mg IV every 6 hours for better BP control. Chronic diastolic CHF  Felt to have diastolic dysfunction in 1884 by cardiology. Her 12 lead EKG on this admission shows sinus rhythm and LBBB (this was seen in 2013 as well). Pt had Myoview in 11/2005 which showed no ischemia or infarction and essentially normal EF)  2-D echo obtained, results pending. GERD / Gastritis  Continue PPI therapy  H/O breast adenocarcinoma  In remission  Has had right breast cancer in 1992 with right mastectomy and axillary node dissection Hypothyroidism  Continue synthroid. DVT prophylaxis  SCD's bilaterally  Radiological Exams on Admission:  Dg Hip Complete Left 02/21/2014 Displaced LEFT femoral neck fracture without dislocation. Osseous demineralization.    Code Status: Full code Family Communication: Plan discussed with the patient. Disposition Plan: Remains inpatient   Consultants:  Orthopedics  Procedures:  None  Antibiotics:  Preoperative antibiotics.   Objective: Filed Vitals:   02/22/14 1058  BP: 139/62  Pulse: 90  Temp:   Resp:     Intake/Output Summary (Last 24 hours) at 02/22/14 1139 Last data filed at 02/22/14 1045  Gross  per 24 hour  Intake    179 ml  Output    600 ml  Net   -421 ml   Filed Weights   02/21/14 2030 02/22/14 0507  Weight: 76.2 kg (167 lb 15.9 oz) 76.658 kg (169 lb)    Exam: General: Alert and awake, oriented x3, not in any acute distress. HEENT: anicteric sclera, pupils reactive to light and accommodation, EOMI CVS: S1-S2 clear, no murmur rubs or gallops Chest: clear to auscultation bilaterally, no wheezing, rales or rhonchi Abdomen: soft nontender, nondistended, normal bowel sounds, no organomegaly Extremities: no cyanosis, clubbing or edema noted bilaterally Neuro: Cranial nerves II-XII intact, no focal neurological deficits  Data Reviewed: Basic Metabolic Panel:  Recent Labs Lab 02/21/14 1741 02/21/14 2050 02/22/14 0505  NA 138 142 140  K 4.3 5.1 4.8  CL 99 102 104  CO2 27 26 26   GLUCOSE 115* 126* 139*  BUN 17 16 16   CREATININE 0.92 0.90 1.09  CALCIUM 10.3 10.6* 9.6  MG  --  2.3  --   PHOS  --  3.1  --    Liver Function Tests:  Recent Labs Lab 02/21/14 1741 02/21/14 2050 02/22/14 0505  AST 31 30 27   ALT 21 21 17   ALKPHOS 78 84 65  BILITOT 0.4 0.6 0.7  PROT 7.3 7.7 6.4  ALBUMIN 3.9 4.1 3.2*   No results found for this basename: LIPASE, AMYLASE,  in the last 168 hours No results found for this basename: AMMONIA,  in the last 168 hours CBC:  Recent Labs Lab 02/21/14 1741 02/21/14 2210 02/22/14 0505  WBC 8.7 12.7* 11.2*  NEUTROABS 6.2  --   --  HGB 17.1* 17.0* 16.0*  HCT 50.0* 51.0* 48.0*  MCV 94.5 94.8 95.8  PLT 163 153 147*   Cardiac Enzymes: No results found for this basename: CKTOTAL, CKMB, CKMBINDEX, TROPONINI,  in the last 168 hours BNP (last 3 results) No results found for this basename: PROBNP,  in the last 8760 hours CBG:  Recent Labs Lab 02/22/14 0808  GLUCAP 134*    Micro Recent Results (from the past 240 hour(s))  MRSA PCR SCREENING     Status: None   Collection Time    02/21/14 10:10 PM      Result Value Ref Range Status     MRSA by PCR NEGATIVE  NEGATIVE Final   Comment:            The GeneXpert MRSA Assay (FDA     approved for NASAL specimens     only), is one component of a     comprehensive MRSA colonization     surveillance program. It is not     intended to diagnose MRSA     infection nor to guide or     monitor treatment for     MRSA infections.     Studies: Dg Hip Complete Left  02/21/2014   CLINICAL DATA:  [place] onto LEFT hip, pain  EXAM: LEFT HIP - COMPLETE 2+ VIEW  COMPARISON:  12/19/2011  FINDINGS: Osseous demineralization.  Minimal narrowing of hip joints.  SI joints preserved.  Displaced LEFT femoral neck fracture without dislocation.  Pelvis appears intact.  Degenerative disc disease changes lower lumbar spine.  IMPRESSION: Displaced LEFT femoral neck fracture without dislocation.  Osseous demineralization.   Electronically Signed   By: Lavonia Dana M.D.   On: 02/21/2014 16:07    Scheduled Meds: . [facility] HOLD] diclofenac sodium  2 g Topical BID  . [facility] HOLD] famotidine  20 mg Oral QHS  . [facility] HOLD] furosemide  40 mg Oral BID  . [facility] HOLD] gabapentin  100 mg Oral QHS  . [facility] HOLD] hydrALAZINE  10 mg Intravenous Q6H  . [[facility] HOLD] imipramine  50 mg Oral QHS  . [[facility] HOLD] isosorbide mononitrate  30 mg Oral Daily  . [facility] HOLD] levothyroxine  100 mcg Oral QAC breakfast  . [[facility] HOLD] metoprolol tartrate  25 mg Oral BID  . [[facility] HOLD] multivitamin with minerals  1 tablet Oral Daily  . [facility] HOLD] pantoprazole  40 mg Oral QAC breakfast  . [[facility] HOLD] potassium chloride SA  20 mEq Oral Daily   Continuous Infusions: . sodium chloride 10 mL/hr at 02/21/14 2106       Time spent: 35 minutes    [facility] Pager [phone] If 7PM-7AM, please contact night-coverage at [email or web address], password [facility] 02/22/2014, 11:39 AM  LOS: 1 day

## 2014-02-22 NOTE — Anesthesia Postprocedure Evaluation (Signed)
  Anesthesia Post-op Note  Anesthesia Post Note  Patient: [name]  Procedure(s) Performed: Procedure(s) (LRB): ARTHROPLASTY BIPOLAR HIP (Left)  Anesthesia type: General  Patient location: PACU  Post pain: Pain level controlled  Post assessment: Post-op Vital signs reviewed  Last Vitals:  Filed Vitals:   02/22/14 1530  BP: 137/62  Pulse: 82  Temp: 37.1 C  Resp: 16    Post vital signs: Reviewed  Level of consciousness: sedated  Complications: No apparent anesthesia complications
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
## 2014-02-22 NOTE — Progress Notes (Signed)
Pt. Returned from surgery via bed. Denies complaints of pain and no respiratory distress noted. Dressing to Left hip clean, dry, and intact.

## 2014-02-22 NOTE — Progress Notes (Signed)
Echocardiogram 2D Echocardiogram has been performed.  [name] 02/22/2014, 9:33 AM

## 2014-02-22 NOTE — Op Note (Signed)
[name], TURK NO.:  [id number]  MEDICAL RECORD NO.:  [id number]  LOCATION:  [facility]                         FACILITY:  [facility]  PHYSICIAN:  Anderson Malta, M.D.    DATE OF BIRTH:  21-Feb-1925  DATE OF PROCEDURE: DATE OF DISCHARGE:                              OPERATIVE REPORT   PREOPERATIVE DIAGNOSIS:  Left displaced femoral neck fracture.  POSTOPERATIVE DIAGNOSIS:  Left displaced femoral neck fracture.  PROCEDURE:  Left femoral neck fracture removal with hip hemiarthroplasty, DePuy Tri-Lock size 5 stem, high offset head -3, 48 mm unipolar ball.  SURGEON:  Anderson Malta, M.D.  ASSISTANT:  None.  ANESTHESIA:  General.  INDICATIONS:  [name] is an ambulatory patient with left hip fracture, presents for operative management after explanation of risks and benefits.  PROCEDURE IN DETAIL:  The patient was brought to the operating room where general endotracheal anesthesia was induced.  Preoperative antibiotics were administered.  IV antibiotics were administered.  Time- out was called.  The patient was placed in lateral decubitus position, left hip up with right peroneal and axillary areas well padded.  Left hip was prescrubbed with alcohol and Betadine, let to air dry, prepped with DuraPrep solution, draped in sterile manner.  Time-out was called. Posterior approach to the hip was made.  Skin and subcutaneous tissue were sharply divided.  Fascia lata was identified and divided.  Gluteus was split in direction of its fibers.  Sciatic nerve palpated and protected at all times.  The piriformis tendon was tagged and retracted. Capsule was split and marked with #1 Vicryl suture.  Head was removed. The cut was made slightly less than a fingerbreadth above the lesser trochanter.  Lateralization was performed.  Trial broaching was then performed and a size 5 Tri-Lock was deemed to have a very good rotationally stable fit.  This was then trialed with a 48 mm 0  head. The patient had excellent stability and excellent leg lengths. Stability was excellent in full extension, external rotation, the position of sleep, 10 degrees of abduction and 70 degrees of internal rotation.  Trial components were removed.  True stem was then tapped into position so that 2 mm higher than the original stem.  Calcar was inspected and found to be completely intact.  At this time, a -3 trial was performed and found to have excellent stability.  This was the one chosen, and that was reduced in good position.  Thorough irrigation was then performed both before and after the reduction.  Sciatic nerve was palpated and visualized and intact.  The capsule was closed using #1 Vicryl suture.  Piriformis tendon tagged with a capsule.  Fascia lata closed using interrupted inverted #1 Vicryl suture.  Skin closed using interrupted inverted 0 Vicryl suture, 2-0 Vicryl suture, and skin staples.  The knee immobilizer was not really required because of the excellent stability of the prosthesis.  Mepilex dressing was used to cover the incision.  Weightbearing as tolerated.     Anderson Malta, M.D.     GSD/MEDQ  D:  02/22/2014  T:  02/22/2014  Job:  [id number]

## 2014-02-22 NOTE — Progress Notes (Signed)
Pt. To OR via bed. No respiratory distress noted. Daughter notified of pt. going down for surgery.

## 2014-02-22 NOTE — Transfer of Care (Signed)
Immediate Anesthesia Transfer of Care Note  Patient: [name]  Procedure(s) Performed: Procedure(s): ARTHROPLASTY BIPOLAR HIP (Left)  Patient Location: PACU  Anesthesia Type:General  Level of Consciousness: awake, alert , oriented and patient cooperative  Airway & Oxygen Therapy: Patient Spontanous Breathing and Patient connected to face mask oxygen  Post-op Assessment: Report given to PACU RN, Post -op Vital signs reviewed and stable and Patient moving all extremities  Post vital signs: Reviewed and stable  Complications: No apparent anesthesia complications

## 2014-02-22 NOTE — Brief Op Note (Signed)
02/21/2014 - 02/22/2014  2:34 PM  PATIENT:  [name]  80 y.o. female  PRE-OPERATIVE DIAGNOSIS:  Left hip fracture  POST-OPERATIVE DIAGNOSIS:  Left hip fracture  PROCEDURE:  Procedure(s): ARTHROPLASTY BIPOLAR HIP  SURGEON:  Surgeon(s): Meredith Pel, MD  ASSISTANT: none  ANESTHESIA:   general  EBL: 150 ml    Total I/O In: 1000 [I.V.:1000] Out: 200 [Urine:100; Blood:100]  BLOOD ADMINISTERED: none  DRAINS: none   LOCAL MEDICATIONS USED:  none  SPECIMEN:  No Specimen  COUNTS:  YES  TOURNIQUET:  * No tourniquets in log *  DICTATION: .Other Dictation: Dictation Number [id number]  PLAN OF CARE: Admit to inpatient   PATIENT DISPOSITION:  PACU - hemodynamically stable

## 2014-02-23 DIAGNOSIS — I309 Acute pericarditis, unspecified: Secondary | ICD-10-CM

## 2014-02-23 DIAGNOSIS — I2699 Other pulmonary embolism without acute cor pulmonale: Secondary | ICD-10-CM

## 2014-02-23 DIAGNOSIS — I872 Venous insufficiency (chronic) (peripheral): Secondary | ICD-10-CM

## 2014-02-23 LAB — BASIC METABOLIC PANEL
ANION GAP: 12 (ref 5–15)
BUN: 17 mg/dL (ref 6–23)
CHLORIDE: 103 meq/L (ref 96–112)
CO2: 24 mEq/L (ref 19–32)
Calcium: 8.8 mg/dL (ref 8.4–10.5)
Creatinine, Ser: 0.87 mg/dL (ref 0.50–1.10)
GFR calc Af Amer: 68 mL/min — ABNORMAL LOW (ref >90)
GFR calc non Af Amer: 59 mL/min — ABNORMAL LOW (ref >90)
GLUCOSE: 133 mg/dL — AB (ref 70–99)
Potassium: 4 mEq/L (ref 3.7–5.3)
Sodium: 139 mEq/L (ref 137–147)

## 2014-02-23 LAB — PROTIME-INR
INR: 1.16 (ref 0.00–1.49)
Prothrombin Time: 14.8 seconds (ref 11.6–15.2)

## 2014-02-23 LAB — GLUCOSE, CAPILLARY: Glucose-Capillary: 110 mg/dL — ABNORMAL HIGH (ref 70–99)

## 2014-02-23 LAB — CBC
HCT: 43.2 % (ref 36.0–46.0)
Hemoglobin: 14.3 g/dL (ref 12.0–15.0)
MCH: 31.5 pg (ref 26.0–34.0)
MCHC: 33.1 g/dL (ref 30.0–36.0)
MCV: 95.2 fL (ref 78.0–100.0)
Platelets: 147 10*3/uL — ABNORMAL LOW (ref 150–400)
RBC: 4.54 MIL/uL (ref 3.87–5.11)
RDW: 13.6 % (ref 11.5–15.5)
WBC: 13 10*3/uL — AB (ref 4.0–10.5)

## 2014-02-23 MED ORDER — HYDROCODONE-ACETAMINOPHEN 5-325 MG PO TABS
1.0000 | ORAL_TABLET | ORAL | Status: DC | PRN
  Administered 2014-02-23 (×2): 2 via ORAL
  Administered 2014-02-24: 1 via ORAL
  Filled 2014-02-23: qty 1
  Filled 2014-02-23 (×2): qty 2

## 2014-02-23 MED ORDER — HEPARIN SODIUM (PORCINE) 5000 UNIT/ML IJ SOLN
5000.0000 [IU] | Freq: Three times a day (TID) | INTRAMUSCULAR | Status: DC
  Administered 2014-02-23 – 2014-02-24 (×3): 5000 [IU] via SUBCUTANEOUS
  Filled 2014-02-23 (×6): qty 1

## 2014-02-23 NOTE — Evaluation (Signed)
Physical Therapy Evaluation Patient Details Name: Makana Feigel MRN: [id number] DOB: 1924-08-15 Today's Date: 02/23/2014   History of Present Illness  80 yo female who lives at independent living fell and suffered L Hip fracture. Admitted 02/21/14. s/p L hip hemiarthroplasty on 02/22/14.  Clinical Impression  Pt mobilized to recliner after stop on BSC. Pt will benefit from PT to address problems listed in note below.     Follow Up Recommendations SNF    Equipment Recommendations  None recommended by PT    Recommendations for Other Services       Precautions / Restrictions Precautions Precautions: Posterior Hip;Fall Restrictions LLE Weight Bearing: Weight bearing as tolerated      Mobility  Bed Mobility Overal bed mobility: Needs Assistance;+2 for physical assistance;+ 2 for safety/equipment Bed Mobility: Supine to Sit     Supine to sit: +2 for safety/equipment;+2 for physical assistance;Mod assist     General bed mobility comments: support of L leg  moving to the edge of the bed., assist for trunk into upright, cues for posterior hip precautions  Transfers Overall transfer level: Needs assistance Equipment used: Rolling walker (2 wheeled) Transfers: Sit to/from [employer] Transfers Sit to Stand: +2 physical assistance;+2 safety/equipment;From elevated surface;Mod assist Stand pivot transfers: +2 physical assistance;+2 safety/equipment;Max assist       General transfer comment: pt having incontinence, moved quickly to the  [facility]. multimodal cues for hand placement, pt tends to hold L leg in the air, cues to increase weight on LLE. Pt pivoted to [facility]. Stood again and recliner brought up behind her.  Ambulation/Gait                Stairs            Wheelchair Mobility    Modified Rankin (Stroke Patients Only)       Balance                                             Pertinent Vitals/Pain Pain Assessment: 0-10 Pain Score: 6   Pain Descriptors / Indicators: Cramping;Aching;Tightness;Spasm Pain Intervention(s): Limited activity within patient's tolerance;Premedicated before session;Ice applied;Monitored during session;Repositioned    Home Living Family/patient expects to be discharged to:: Skilled nursing facility                      Prior Function                 Hand Dominance        Extremity/Trunk Assessment   Upper Extremity Assessment: Overall WFL for tasks assessed           Lower Extremity Assessment: LLE deficits/detail   LLE Deficits / Details: tends to not place weight on LLE  Cervical / Trunk Assessment: Kyphotic  Communication      Cognition Arousal/Alertness: Awake/alert Behavior During Therapy: WFL for tasks assessed/performed Overall Cognitive Status: Within Functional Limits for tasks assessed                      General Comments      Exercises Total Joint Exercises Heel Slides: AAROM;5 reps;Supine      Assessment/Plan    PT Assessment Patient needs continued PT services  PT Diagnosis Difficulty walking;Acute pain   PT Problem List Decreased strength;Decreased range of motion;Decreased activity tolerance;Decreased balance;Decreased mobility;Pain;Decreased knowledge of use of DME;Decreased safety awareness;Decreased  knowledge of precautions  PT Treatment Interventions DME instruction;Gait training;Functional mobility training;Therapeutic exercise;Therapeutic activities;Patient/family education   PT Goals (Current goals can be found in the Care Plan section) Acute Rehab PT Goals Patient Stated Goal: I want to get back to helping at the  home PT Goal Formulation: With patient Time For Goal Achievement: 03/09/14 Potential to Achieve Goals: Good    Frequency Min 3X/week   Barriers to discharge        Co-evaluation               End of Session Equipment Utilized During Treatment: Gait belt Activity Tolerance: Patient limited by  pain;Patient limited by fatigue Patient left: in chair;with call bell/phone within reach Nurse Communication: Mobility status         Time: 1106-1140 PT Time Calculation (min): 34 min   Charges:   PT Evaluation $Initial PT Evaluation Tier I: 1 Procedure PT Treatments $Therapeutic Activity: 23-37 mins   PT G Codes:          Claretha Cooper 02/23/2014, 2:33 PM Tresa Endo PT [phone]

## 2014-02-23 NOTE — Progress Notes (Signed)
Clinical Social Work Department BRIEF PSYCHOSOCIAL ASSESSMENT 02/23/2014  Patient:  [name], [name]     Account Number:  [id number]     Admit date:  02/21/2014  Clinical Social Worker:  Earlie Server  Date/Time:  02/23/2014 02:15 PM  Referred by:  Physician  Date Referred:  02/23/2014 Referred for  SNF Placement   Other Referral:   Interview type:  Patient Other interview type:    PSYCHOSOCIAL DATA Living Status:  FACILITY Admitted from facility:  [facility] Level of care:  Independent Living Primary support name:  Ivin Booty Primary support relationship to patient:  CHILD, ADULT Degree of support available:   Strong    CURRENT CONCERNS Current Concerns  Post-Acute Placement   Other Concerns:    SOCIAL WORK ASSESSMENT / PLAN CSW received referral to assist with DC planning. Per chart review, patient lives at [facility] but may need SNF at DC. CSW met with patient at bedside. CSW introduced myself and explained role.    Patient reports she enjoys the facility she lives at now but knows that she needs additional help at DC. Patient reports that her dtr has already started researching SNFs. Patient is familiar with process and reports she has been to SNFs in the past but cannot recall the names of the agencies. Patient reports that CSW can call and speak with dtr who will have more information. CSW spoke with dtr via phone who reports that patient has been to [facility] in the past but that she would prefer her go to a different SNF this time. Dtr reports she has heard good recommendations for [facility] and will complete her own research to determine if patient should go there. CSW left SNF list in the room for dtr to review and will complete [facility] search.    CSW completed FL2 and faxed out. CSW will follow up with bed offers.   Assessment/plan status:  Psychosocial Support/Ongoing Assessment of Needs Other assessment/ plan:    Information/referral to community resources:   SNF list    PATIENT'S/FAMILY'S RESPONSE TO PLAN OF CARE: Patient alert and oriented but reports that dtr is more involved with making decisions about DC. CSW spoke with dtr who thanked CSW for information and is agreeable to tour SNFs to ensure that patient receives adequate care. Dtr reports that she is open to [facility] and will review list today with patient in order to have their top choices in mind. Dtr is hopeful for ST SNF and then for patient to return to ind. living.       Sindy Messing, LCSW (Weekend Coverage)

## 2014-02-23 NOTE — Progress Notes (Signed)
Subjective: Pt stable - has been working with PT   Objective: Vital signs in last 24 hours: Temp:  [97.7 F (36.5 C)-99.1 F (37.3 C)] 98 F (36.7 C) (08/23 1325) Pulse Rate:  [81-97] 97 (08/23 1325) Resp:  [16-20] 16 (08/23 1325) BP: (121-152)/(43-74) 133/51 mmHg (08/23 1325) SpO2:  [92 %-99 %] 98 % (08/23 1325) Weight:  [79.833 kg (176 lb)] 79.833 kg (176 lb) (08/23 0450)  Intake/Output from previous day: 08/22 0701 - 08/23 0700 In: 3268.8 [P.O.:510; I.V.:2708.8; IV Piggyback:50] Out: 1300 [Urine:1200; Blood:100] Intake/Output this shift: Total I/O In: 120 [P.O.:120] Out: 250 [Urine:250]  Exam:  Intact pulses distally Dorsiflexion/Plantar flexion intact  Labs:  Recent Labs  02/21/14 1741 02/21/14 2210 02/22/14 0505 02/23/14 0520  HGB 17.1* 17.0* 16.0* 14.3    Recent Labs  02/22/14 0505 02/23/14 0520  WBC 11.2* 13.0*  RBC 5.01 4.54  HCT 48.0* 43.2  PLT 147* 147*    Recent Labs  02/22/14 0505 02/23/14 0520  NA 140 139  K 4.8 4.0  CL 104 103  CO2 26 24  BUN 16 17  CREATININE 1.09 0.87  GLUCOSE 139* 133*  CALCIUM 9.6 8.8    Recent Labs  02/22/14 0505 02/23/14 0520  INR 1.08 1.16    Assessment/Plan: Pt doing well - mobilize with PT - ok for tx to snf mon or tuesday   DEAN,GREGORY SCOTT 02/23/2014, 4:05 PM

## 2014-02-23 NOTE — Progress Notes (Signed)
Clinical Social Work Department CLINICAL SOCIAL WORK PLACEMENT NOTE 02/23/2014  Patient:  [name], [name]  Account Number:  [id number] [place] date:  02/21/2014  Clinical Social Worker:  Sindy Messing, LCSW  Date/time:  02/23/2014 02:00 PM  Clinical Social Work is seeking post-discharge placement for this patient at the following level of care:   [place]   (*CSW will update this form in Epic as items are completed)   02/23/2014  Patient/family provided with [place] Department of Clinical Social Work's list of facilities offering this level of care within the geographic area requested by the patient (or if unable, by the patient's family).  02/23/2014  Patient/family informed of their freedom to choose among providers that offer the needed level of care, that participate in Medicare, Medicaid or managed care program needed by the patient, have an available bed and are willing to accept the patient.  02/23/2014  Patient/family informed of [facility]' ownership interest in [facility], as well as of the fact that they are under no obligation to receive care at this facility.  PASARR submitted to EDS on existing # PASARR number received on   FL2 transmitted to all facilities in geographic area requested by pt/family on  02/23/2014 FL2 transmitted to all facilities within larger geographic area on   Patient informed that his/her managed care company has contracts with or will negotiate with  certain facilities, including the following:     Patient/family informed of bed offers received:   Patient chooses bed at  Physician recommends and patient chooses bed at    Patient to be transferred to  on   Patient to be transferred to facility by  Patient and family notified of transfer on  Name of family member notified:    The following physician request were entered in Epic:   Additional Comments:

## 2014-02-23 NOTE — Progress Notes (Signed)
[facility] PROGRESS NOTE   [name] KAJ:[id number] DOB: April 10, 1925 DOA: 02/21/2014 PCP: Reymundo Poll, MD  HPI/Subjective: Pain is controlled, seen while she was getting ready to eat her breakfast.  Assessment/Plan: Principal Problem:   Fracture of femoral neck, left Active Problems:   HYPOTHYROIDISM   ANXIETY   HYPERTENSION   Diastolic dysfunction, left ventricle as evidenced by moderate LVH on echocardiogram June 2013   GERD   GASTRITIS   Fall   Hip fracture   Fracture of femoral neck, left  Status post mechanical fall  Appreciate orthopedic surgery consult and recommendations.  Continue pain control with narcotics. Started heparin for DVT prophylaxis. Active Problems:  HYPERTENSION / Accelerated hypertension  Restart home meds: Imdur, Lasix, Metoprolol  Added hydralazine 10 mg IV every 6 hours for better BP control. Chronic diastolic CHF  Felt to have diastolic dysfunction in 0355 by cardiology. Her 12 lead EKG on this admission shows sinus rhythm and LBBB (this was seen in 2013 as well). Pt had Myoview in 11/2005 which showed no ischemia or infarction and essentially normal EF)  2-D echo obtained, results pending. GERD / Gastritis  Continue PPI therapy  H/O breast adenocarcinoma  In remission  Has had right breast cancer in 1992 with right mastectomy and axillary node dissection Hypothyroidism  Continue synthroid. DVT prophylaxis  SCD's bilaterally  Radiological Exams on Admission:  Dg Hip Complete Left 02/21/2014 Displaced LEFT femoral neck fracture without dislocation. Osseous demineralization.    Code Status: Full code Family Communication: Plan discussed with the patient. Disposition Plan: Remains inpatient   Consultants:  Orthopedics  Procedures:  None  Antibiotics:  Preoperative antibiotics.   Objective: Filed Vitals:   02/23/14 0726  BP: 121/50  Pulse:   Temp:   Resp:     Intake/Output Summary (Last 24 hours) at 02/23/14  1127 Last data filed at 02/23/14 9741  Gross per 24 hour  Intake 3268.75 ml  Output   1300 ml  Net 1968.75 ml   Filed Weights   02/21/14 2030 02/22/14 0507 02/23/14 0450  Weight: 76.2 kg (167 lb 15.9 oz) 76.658 kg (169 lb) 79.833 kg (176 lb)    Exam: General: Alert and awake, oriented x3, not in any acute distress. HEENT: anicteric sclera, pupils reactive to light and accommodation, EOMI CVS: S1-S2 clear, no murmur rubs or gallops Chest: clear to auscultation bilaterally, no wheezing, rales or rhonchi Abdomen: soft nontender, nondistended, normal bowel sounds, no organomegaly Extremities: no cyanosis, clubbing or edema noted bilaterally Neuro: Cranial nerves II-XII intact, no focal neurological deficits  Data Reviewed: Basic Metabolic Panel:  Recent Labs Lab 02/21/14 1741 02/21/14 2050 02/22/14 0505 02/23/14 0520  NA 138 142 140 139  K 4.3 5.1 4.8 4.0  CL 99 102 104 103  CO2 27 26 26 24   GLUCOSE 115* 126* 139* 133*  BUN 17 16 16 17   CREATININE 0.92 0.90 1.09 0.87  CALCIUM 10.3 10.6* 9.6 8.8  MG  --  2.3  --   --   PHOS  --  3.1  --   --    Liver Function Tests:  Recent Labs Lab 02/21/14 1741 02/21/14 2050 02/22/14 0505  AST 31 30 27   ALT 21 21 17   ALKPHOS 78 84 65  BILITOT 0.4 0.6 0.7  PROT 7.3 7.7 6.4  ALBUMIN 3.9 4.1 3.2*   No results found for this basename: LIPASE, AMYLASE,  in the last 168 hours No results found for this basename: AMMONIA,  in the last 168  hours CBC:  Recent Labs Lab 02/21/14 1741 02/21/14 2210 02/22/14 0505 02/23/14 0520  WBC 8.7 12.7* 11.2* 13.0*  NEUTROABS 6.2  --   --   --   HGB 17.1* 17.0* 16.0* 14.3  HCT 50.0* 51.0* 48.0* 43.2  MCV 94.5 94.8 95.8 95.2  PLT 163 153 147* 147*   Cardiac Enzymes: No results found for this basename: CKTOTAL, CKMB, CKMBINDEX, TROPONINI,  in the last 168 hours BNP (last 3 results) No results found for this basename: PROBNP,  in the last 8760 hours CBG:  Recent Labs Lab 02/22/14 0808  02/23/14 0749  GLUCAP 134* 110*    Micro Recent Results (from the past 240 hour(s))  MRSA PCR SCREENING     Status: None   Collection Time    02/21/14 10:10 PM      Result Value Ref Range Status   MRSA by PCR NEGATIVE  NEGATIVE Final   Comment:            The GeneXpert MRSA Assay (FDA     approved for NASAL specimens     only), is one component of a     comprehensive MRSA colonization     surveillance program. It is not     intended to diagnose MRSA     infection nor to guide or     monitor treatment for     MRSA infections.     Studies: Dg Hip Complete Left  02/21/2014   CLINICAL DATA:  [place] onto LEFT hip, pain  EXAM: LEFT HIP - COMPLETE 2+ VIEW  COMPARISON:  12/19/2011  FINDINGS: Osseous demineralization.  Minimal narrowing of hip joints.  SI joints preserved.  Displaced LEFT femoral neck fracture without dislocation.  Pelvis appears intact.  Degenerative disc disease changes lower lumbar spine.  IMPRESSION: Displaced LEFT femoral neck fracture without dislocation.  Osseous demineralization.   Electronically Signed   By: Lavonia Dana M.D.   On: 02/21/2014 16:07   Dg Pelvis Portable  02/22/2014   CLINICAL DATA:  Status post arthroplasty  EXAM: PORTABLE PELVIS 1-2 VIEWS  COMPARISON:  CT abdomen and pelvis December 19, 2011  FINDINGS: There is a total hip prosthesis on the left which appears well seated on this single view. There is surrounding soft tissue air which may be an expected postoperative finding. There is no acute fracture or dislocation. There is moderate narrowing in the right hip joint region. There are metallic foreign bodies in the region of the right hip joint, stable from 2013.  IMPRESSION: Prosthesis on the left appears well-seated. No acute fracture dislocation. Moderate osteoarthritic change right hip joint.   Electronically Signed   By: Lowella Grip M.D.   On: 02/22/2014 15:43    Scheduled Meds: . aspirin EC  325 mg Oral Q breakfast  . docusate sodium  100 mg  Oral BID  . famotidine  20 mg Oral QHS  . furosemide  40 mg Oral BID  . gabapentin  100 mg Oral QHS  . hydrALAZINE  10 mg Intravenous Q6H  . imipramine  50 mg Oral QHS  . isosorbide mononitrate  30 mg Oral Daily  . levothyroxine  100 mcg Oral QAC breakfast  . metoprolol tartrate  25 mg Oral BID  . multivitamin with minerals  1 tablet Oral Daily  . pantoprazole  40 mg Oral QAC breakfast  . potassium chloride SA  20 mEq Oral Daily   Continuous Infusions: . 0.9 % NaCl with KCl 20 mEq / L 75  mL/hr at 02/23/14 0237       Time spent: 35 minutes    [facility]  [facility] Pager [phone] If 7PM-7AM, please contact night-coverage at [email or web address], password [facility] 02/23/2014, 11:27 AM  LOS: 2 days

## 2014-02-24 ENCOUNTER — Other Ambulatory Visit: Payer: Self-pay | Admitting: *Deleted

## 2014-02-24 ENCOUNTER — Encounter (HOSPITAL_COMMUNITY): Payer: Self-pay | Admitting: Orthopedic Surgery

## 2014-02-24 DIAGNOSIS — I739 Peripheral vascular disease, unspecified: Secondary | ICD-10-CM

## 2014-02-24 DIAGNOSIS — I872 Venous insufficiency (chronic) (peripheral): Secondary | ICD-10-CM

## 2014-02-24 DIAGNOSIS — K219 Gastro-esophageal reflux disease without esophagitis: Secondary | ICD-10-CM | POA: Diagnosis not present

## 2014-02-24 DIAGNOSIS — K2990 Gastroduodenitis, unspecified, without bleeding: Secondary | ICD-10-CM

## 2014-02-24 DIAGNOSIS — I10 Essential (primary) hypertension: Secondary | ICD-10-CM

## 2014-02-24 DIAGNOSIS — M1990 Unspecified osteoarthritis, unspecified site: Secondary | ICD-10-CM

## 2014-02-24 DIAGNOSIS — W19XXXA Unspecified fall, initial encounter: Secondary | ICD-10-CM | POA: Diagnosis not present

## 2014-02-24 DIAGNOSIS — M949 Disorder of cartilage, unspecified: Secondary | ICD-10-CM

## 2014-02-24 DIAGNOSIS — Z9181 History of falling: Secondary | ICD-10-CM | POA: Diagnosis not present

## 2014-02-24 DIAGNOSIS — F411 Generalized anxiety disorder: Secondary | ICD-10-CM

## 2014-02-24 DIAGNOSIS — K589 Irritable bowel syndrome without diarrhea: Secondary | ICD-10-CM

## 2014-02-24 DIAGNOSIS — I5030 Unspecified diastolic (congestive) heart failure: Secondary | ICD-10-CM | POA: Diagnosis not present

## 2014-02-24 DIAGNOSIS — S72143A Displaced intertrochanteric fracture of unspecified femur, initial encounter for closed fracture: Secondary | ICD-10-CM

## 2014-02-24 DIAGNOSIS — I519 Heart disease, unspecified: Secondary | ICD-10-CM | POA: Diagnosis not present

## 2014-02-24 DIAGNOSIS — M6281 Muscle weakness (generalized): Secondary | ICD-10-CM | POA: Diagnosis not present

## 2014-02-24 DIAGNOSIS — C50919 Malignant neoplasm of unspecified site of unspecified female breast: Secondary | ICD-10-CM

## 2014-02-24 DIAGNOSIS — F3289 Other specified depressive episodes: Secondary | ICD-10-CM | POA: Diagnosis not present

## 2014-02-24 DIAGNOSIS — E039 Hypothyroidism, unspecified: Secondary | ICD-10-CM

## 2014-02-24 DIAGNOSIS — S72009S Fracture of unspecified part of neck of unspecified femur, sequela: Secondary | ICD-10-CM | POA: Diagnosis not present

## 2014-02-24 DIAGNOSIS — R269 Unspecified abnormalities of gait and mobility: Secondary | ICD-10-CM | POA: Diagnosis not present

## 2014-02-24 DIAGNOSIS — M797 Fibromyalgia: Secondary | ICD-10-CM

## 2014-02-24 DIAGNOSIS — M25559 Pain in unspecified hip: Secondary | ICD-10-CM | POA: Diagnosis not present

## 2014-02-24 DIAGNOSIS — F329 Major depressive disorder, single episode, unspecified: Secondary | ICD-10-CM | POA: Diagnosis not present

## 2014-02-24 DIAGNOSIS — I309 Acute pericarditis, unspecified: Secondary | ICD-10-CM | POA: Diagnosis not present

## 2014-02-24 DIAGNOSIS — S72009A Fracture of unspecified part of neck of unspecified femur, initial encounter for closed fracture: Secondary | ICD-10-CM | POA: Diagnosis not present

## 2014-02-24 DIAGNOSIS — S72009D Fracture of unspecified part of neck of unspecified femur, subsequent encounter for closed fracture with routine healing: Secondary | ICD-10-CM | POA: Diagnosis not present

## 2014-02-24 DIAGNOSIS — G459 Transient cerebral ischemic attack, unspecified: Secondary | ICD-10-CM

## 2014-02-24 DIAGNOSIS — K2970 Gastritis, unspecified, without bleeding: Secondary | ICD-10-CM

## 2014-02-24 DIAGNOSIS — M899 Disorder of bone, unspecified: Secondary | ICD-10-CM

## 2014-02-24 LAB — BASIC METABOLIC PANEL WITH GFR
Anion gap: 12 (ref 5–15)
BUN: 23 mg/dL (ref 6–23)
CO2: 25 meq/L (ref 19–32)
Calcium: 9.3 mg/dL (ref 8.4–10.5)
Chloride: 98 meq/L (ref 96–112)
Creatinine, Ser: 1.01 mg/dL (ref 0.50–1.10)
GFR calc Af Amer: 57 mL/min — ABNORMAL LOW
GFR calc non Af Amer: 49 mL/min — ABNORMAL LOW
Glucose, Bld: 124 mg/dL — ABNORMAL HIGH (ref 70–99)
Potassium: 3.7 meq/L (ref 3.7–5.3)
Sodium: 135 meq/L — ABNORMAL LOW (ref 137–147)

## 2014-02-24 LAB — PROTIME-INR
INR: 1.13 (ref 0.00–1.49)
Prothrombin Time: 14.5 s (ref 11.6–15.2)

## 2014-02-24 LAB — CBC
HEMATOCRIT: 41.8 % (ref 36.0–46.0)
HEMOGLOBIN: 13.6 g/dL (ref 12.0–15.0)
MCH: 31.1 pg (ref 26.0–34.0)
MCHC: 32.5 g/dL (ref 30.0–36.0)
MCV: 95.7 fL (ref 78.0–100.0)
Platelets: 150 10*3/uL (ref 150–400)
RBC: 4.37 MIL/uL (ref 3.87–5.11)
RDW: 13.7 % (ref 11.5–15.5)
WBC: 11.9 10*3/uL — AB (ref 4.0–10.5)

## 2014-02-24 MED ORDER — LORAZEPAM 1 MG PO TABS: ORAL_TABLET | ORAL | Status: DC

## 2014-02-24 MED ORDER — ASPIRIN EC 325 MG PO TBEC: 325.0000 mg | DELAYED_RELEASE_TABLET | Freq: Every day | ORAL | Status: DC

## 2014-02-24 MED ORDER — HYDROCODONE-ACETAMINOPHEN 5-325 MG PO TABS: 1.0000 | ORAL_TABLET | Freq: Four times a day (QID) | ORAL | Status: DC | PRN

## 2014-02-24 MED ORDER — ENOXAPARIN SODIUM 40 MG/0.4ML ~~LOC~~ SOLN: 40.0000 mg | SUBCUTANEOUS | Status: DC

## 2014-02-24 MED ORDER — LORAZEPAM 1 MG PO TABS: 1.0000 mg | ORAL_TABLET | Freq: Four times a day (QID) | ORAL | Status: DC | PRN

## 2014-02-24 NOTE — Progress Notes (Signed)
Report called to RN at [facility]. Hospital course and discharge reviewed. All questions answered. Callie Fielding RN

## 2014-02-24 NOTE — Progress Notes (Signed)
OT Cancellation Note  Patient Details Name: [name] MRN: [id number] DOB: December 25, 1924   Cancelled Treatment:    Noted plan for SNF. Will defer OT eval to SNF [place], Thereasa Parkin 02/24/2014, 11:02 AM

## 2014-02-24 NOTE — Telephone Encounter (Signed)
[facility] 

## 2014-02-24 NOTE — Progress Notes (Signed)
Patient is set to discharge to [facility] today. Patient & daughter, [name] aware. Discharge packet in [place], [place] aware. [facility] scheduled for 1:45pm pickup.   Clinical Social Work Department CLINICAL SOCIAL WORK PLACEMENT NOTE 02/24/2014  Patient:  [name], [name]  Account Number:  [id number] [place] date:  02/21/2014  Clinical Social Worker:  Sindy Messing, LCSW  Date/time:  02/23/2014 02:00 PM  Clinical Social Work is seeking post-discharge placement for this patient at the following level of care:   [place]   (*CSW will update this form in Epic as items are completed)   02/23/2014  Patient/family provided with [place] Department of Clinical Social Work's list of facilities offering this level of care within the geographic area requested by the patient (or if unable, by the patient's family).  02/23/2014  Patient/family informed of their freedom to choose among providers that offer the needed level of care, that participate in Medicare, Medicaid or managed care program needed by the patient, have an available bed and are willing to accept the patient.  02/23/2014  Patient/family informed of [facility]' ownership interest in [facility], as well as of the fact that they are under no obligation to receive care at this facility.  PASARR submitted to EDS on 02/24/2014 PASARR number received on 02/24/2014  FL2 transmitted to all facilities in geographic area requested by pt/family on  02/23/2014 FL2 transmitted to all facilities within larger geographic area on   Patient informed that his/her managed care company has contracts with or will negotiate with  certain facilities, including the following:     Patient/family informed of bed offers received:  02/24/2014 Patient chooses bed at [place] Physician recommends and patient chooses bed at    Patient to be transferred to [place] on  02/24/2014 Patient to be transferred to facility by  [facility] Patient and family notified of transfer on 02/24/2014 Name of family member notified:  patient's daughter, [name]  The following physician request were entered in Epic:   Additional Comments:   Raynaldo Opitz, [place] Social Worker cell #: [phone]

## 2014-02-24 NOTE — Progress Notes (Signed)
Subjective: Pt stable - has been ambulating   Objective: Vital signs in last 24 hours: Temp:  [98 F (36.7 C)-99 F (37.2 C)] 99 F (37.2 C) (08/24 0446) Pulse Rate:  [88-97] 97 (08/24 0446) Resp:  [16-18] 16 (08/24 0446) BP: (125-135)/(51-64) 127/64 mmHg (08/24 0446) SpO2:  [94 %-98 %] 94 % (08/24 0446) Weight:  [81.466 kg (179 lb 9.6 oz)] 81.466 kg (179 lb 9.6 oz) (08/24 0600)  Intake/Output from previous day: 08/23 0701 - 08/24 0700 In: 1357.5 [P.O.:480; I.V.:877.5] Out: 1450 [Urine:1450] Intake/Output this shift: Total I/O In: 120 [P.O.:120] Out: -   Exam:  No cellulitis present Compartment soft  Labs:  Recent Labs  02/21/14 1741 02/21/14 2210 02/22/14 0505 02/23/14 0520 02/24/14 0443  HGB 17.1* 17.0* 16.0* 14.3 13.6    Recent Labs  02/23/14 0520 02/24/14 0443  WBC 13.0* 11.9*  RBC 4.54 4.37  HCT 43.2 41.8  PLT 147* 150    Recent Labs  02/23/14 0520 02/24/14 0443  NA 139 135*  K 4.0 3.7  CL 103 98  CO2 24 25  BUN 17 23  CREATININE 0.87 1.01  GLUCOSE 133* 124*  CALCIUM 8.8 9.3    Recent Labs  02/23/14 0520 02/24/14 0443  INR 1.16 1.13    Assessment/Plan: Plan snf am ready ortho wise - wbat rx on chart   DEAN,GREGORY SCOTT 02/24/2014, 11:09 AM

## 2014-02-24 NOTE — Discharge Summary (Signed)
Physician Discharge Summary  [name] WUX:[id number] DOB: 19-May-1925 DOA: 02/21/2014  PCP: Reymundo Poll, MD  Admit date: 02/21/2014 Discharge date: 02/24/2014  Time spent: 40 minutes  Recommendations for Outpatient Follow-up:  1. Followup with primary care physician within one week. 2. Continue Lovenox 40 mg subcutaneously for 3 weeks for DVT prophylaxis  Discharge Diagnoses:  Principal Problem:   Fracture of femoral neck, left Active Problems:   HYPOTHYROIDISM   ANXIETY   HYPERTENSION   Diastolic dysfunction, left ventricle as evidenced by moderate LVH on echocardiogram June 2013   GERD   GASTRITIS   Fall   Hip fracture   Discharge Condition: Stable  Diet recommendation: Arthropathy  Filed Weights   02/22/14 0507 02/23/14 0450 02/24/14 0600  Weight: 76.658 kg (169 lb) 79.833 kg (176 lb) 81.466 kg (179 lb 9.6 oz)    History of present illness:  80 year old female with past medical history of hypertension, chronic diastolic CHF, GERD, gastritis, hypothyroidism who presented to [facility] ED 02/21/2014 after a fall at nursing home while participating in a group exercise. Patient reported no prodromal symptoms prior to the fall such as chest pain, shortness of breath or palpitations. No lightheadedness or loss of consciousness. No fevers or chills. No abdominal pain or nausea or vomiting. No reports of diarrhea or constipation. No reports of blood in stool or urine.  In ED, BP was 165/80, HR 84, T max 98.3 F and oxygen saturation of 98% Blood work was unremarkable. X ray of the left hip showed left femoral neck fracture. Orthopedic surgery (Dr. Marlou Sa) will see the pt in consultation.   Hospital Course:   Fracture of femoral neck, left  Status post mechanical fall  Appreciate orthopedic surgery consult and recommendations.  Continue pain control with narcotics. Ambulated by PT and Recommended SNF. Started on Lovenox 40 mg for DVT prophylaxis for at least 3 weeks or when patient is  fully ambulating. HYPERTENSION / Accelerated hypertension  Restart home meds: Imdur, Lasix, Metoprolol  Added hydralazine 10 mg IV every 6 hours for better BP control. Chronic diastolic CHF  2-D echo showed ejection fraction of 55-60%. No evidence of fluid overload at this admission. GERD / Gastritis  Continue PPI therapy  H/O breast adenocarcinoma  In remission  Has had right breast cancer in 1992 with right mastectomy and axillary node dissection Hypothyroidism  Continue synthroid. DVT prophylaxis  SCD's bilaterally    Radiological Exams on Admission:  Dg Hip Complete Left 02/21/2014 Displaced LEFT femoral neck fracture without dislocation. Osseous demineralization.  Procedures:  Left hip hemiarthroplasty done by Dr. Marlou Sa on 02/22/14.  Consultations:   Dr. Alphonzo Severance of orthopedics.  Discharge Exam: Filed Vitals:   02/24/14 0446  BP: 127/64  Pulse: 97  Temp: 99 F (37.2 C)  Resp: 16   General: Alert and awake, oriented x3, not in any acute distress. HEENT: anicteric sclera, pupils reactive to light and accommodation, EOMI CVS: S1-S2 clear, no murmur rubs or gallops Chest: clear to auscultation bilaterally, no wheezing, rales or rhonchi Abdomen: soft nontender, nondistended, normal bowel sounds, no organomegaly Extremities: no cyanosis, clubbing or edema noted bilaterally Neuro: Cranial nerves II-XII intact, no focal neurological deficits  Discharge Instructions You were cared for by a hospitalist during your hospital stay. If you have any questions about your discharge medications or the care you received while you were in the hospital after you are discharged, you can call the unit and asked to speak with the hospitalist on call if the hospitalist that  took care of you is not available. Once you are discharged, your primary care physician will handle any further medical issues. Please note that NO REFILLS for any discharge medications will be authorized once you are  discharged, as it is imperative that you return to your primary care physician (or establish a relationship with a primary care physician if you do not have one) for your aftercare needs so that they can reassess your need for medications and monitor your lab values.  Discharge Instructions   Diet - low sodium heart healthy    Complete by:  As directed      Increase activity slowly    Complete by:  As directed      Weight bearing as tolerated    Complete by:  As directed             Medication List         aspirin EC 325 MG tablet  Take 1 tablet (325 mg total) by mouth daily.     enoxaparin 40 MG/0.4ML injection  Commonly known as:  LOVENOX  Inject 0.4 mLs (40 mg total) into the skin daily.     esomeprazole 40 MG capsule  Commonly known as:  NEXIUM  Take 1 capsule (40 mg total) by mouth daily before breakfast.     furosemide 20 MG tablet  Commonly known as:  LASIX  Take 40 mg by mouth 2 (two) times daily.     gabapentin 100 MG capsule  Commonly known as:  NEURONTIN  Take 1 capsule (100 mg total) by mouth at bedtime.     HYDROcodone-acetaminophen 5-325 MG per tablet  Commonly known as:  NORCO  Take 1-2 tablets by mouth every 6 (six) hours as needed for moderate pain.     imipramine 25 MG tablet  Commonly known as:  TOFRANIL  Take 50 mg by mouth at bedtime.     isosorbide mononitrate 30 MG 24 hr tablet  Commonly known as:  IMDUR  Take 1 tablet (30 mg total) by mouth daily.     levothyroxine 100 MCG tablet  Commonly known as:  SYNTHROID, LEVOTHROID  Take 1 tablet (100 mcg total) by mouth daily.     LORazepam 1 MG tablet  Commonly known as:  ATIVAN  Take 1 tablet (1 mg total) by mouth every 6 (six) hours as needed for anxiety.     metoprolol tartrate 25 MG tablet  Commonly known as:  LOPRESSOR  Take 1 tablet (25 mg total) by mouth 2 (two) times daily.     multivitamin tablet  Take 2 tablets by mouth daily.     potassium chloride SA 20 MEQ tablet  Commonly  known as:  KLOR-CON M20  Take 1 tablet (20 mEq total) by mouth daily.     ranitidine 150 MG tablet  Commonly known as:  ZANTAC  Take 150 mg by mouth at bedtime.     VOLTAREN 1 % Gel  Generic drug:  diclofenac sodium  Apply 2 g topically 2 (two) times daily.     zolpidem 10 MG tablet  Commonly known as:  AMBIEN  Take 5 mg by mouth at bedtime as needed for sleep.       Allergies  Allergen Reactions  . Codeine     REACTION: itching  . Pregabalin     REACTION: causes nervousness       Follow-up Information   Follow up with Reymundo Poll, MD In 1 week.   Specialty:  Family  Medicine   Contact information:   [address] [place] [address] [phone]        The results of significant diagnostics from this hospitalization (including imaging, microbiology, ancillary and laboratory) are listed below for reference.    Significant Diagnostic Studies: Dg Hip Complete Left  02/21/2014   CLINICAL DATA:  [place] onto LEFT hip, pain  EXAM: LEFT HIP - COMPLETE 2+ VIEW  COMPARISON:  12/19/2011  FINDINGS: Osseous demineralization.  Minimal narrowing of hip joints.  SI joints preserved.  Displaced LEFT femoral neck fracture without dislocation.  Pelvis appears intact.  Degenerative disc disease changes lower lumbar spine.  IMPRESSION: Displaced LEFT femoral neck fracture without dislocation.  Osseous demineralization.   Electronically Signed   By: Lavonia Dana M.D.   On: 02/21/2014 16:07   Dg Pelvis Portable  02/22/2014   CLINICAL DATA:  Status post arthroplasty  EXAM: PORTABLE PELVIS 1-2 VIEWS  COMPARISON:  CT abdomen and pelvis December 19, 2011  FINDINGS: There is a total hip prosthesis on the left which appears well seated on this single view. There is surrounding soft tissue air which may be an expected postoperative finding. There is no acute fracture or dislocation. There is moderate narrowing in the right hip joint region. There are metallic foreign bodies in the region of  the right hip joint, stable from 2013.  IMPRESSION: Prosthesis on the left appears well-seated. No acute fracture dislocation. Moderate osteoarthritic change right hip joint.   Electronically Signed   By: Lowella Grip M.D.   On: 02/22/2014 15:43    Microbiology: Recent Results (from the past 240 hour(s))  MRSA PCR SCREENING     Status: None   Collection Time    02/21/14 10:10 PM      Result Value Ref Range Status   MRSA by PCR NEGATIVE  NEGATIVE Final   Comment:            The GeneXpert MRSA Assay (FDA     approved for NASAL specimens     only), is one component of a     comprehensive MRSA colonization     surveillance program. It is not     intended to diagnose MRSA     infection nor to guide or     monitor treatment for     MRSA infections.     Labs: Basic Metabolic Panel:  Recent Labs Lab 02/21/14 1741 02/21/14 2050 02/22/14 0505 02/23/14 0520 02/24/14 0443  NA 138 142 140 139 135*  K 4.3 5.1 4.8 4.0 3.7  CL 99 102 104 103 98  CO2 27 26 26 24 25   GLUCOSE 115* 126* 139* 133* 124*  BUN 17 16 16 17 23   CREATININE 0.92 0.90 1.09 0.87 1.01  CALCIUM 10.3 10.6* 9.6 8.8 9.3  MG  --  2.3  --   --   --   PHOS  --  3.1  --   --   --    Liver Function Tests:  Recent Labs Lab 02/21/14 1741 02/21/14 2050 02/22/14 0505  AST 31 30 27   ALT 21 21 17   ALKPHOS 78 84 65  BILITOT 0.4 0.6 0.7  PROT 7.3 7.7 6.4  ALBUMIN 3.9 4.1 3.2*   No results found for this basename: LIPASE, AMYLASE,  in the last 168 hours No results found for this basename: AMMONIA,  in the last 168 hours CBC:  Recent Labs Lab 02/21/14 1741 02/21/14 2210 02/22/14 0505 02/23/14 0520 02/24/14 0443  WBC 8.7 12.7* 11.2* 13.0* 11.9*  NEUTROABS 6.2  --   --   --   --   HGB 17.1* 17.0* 16.0* 14.3 13.6  HCT 50.0* 51.0* 48.0* 43.2 41.8  MCV 94.5 94.8 95.8 95.2 95.7  PLT 163 153 147* 147* 150   Cardiac Enzymes: No results found for this basename: CKTOTAL, CKMB, CKMBINDEX, TROPONINI,  in the last  168 hours BNP: BNP (last 3 results) No results found for this basename: PROBNP,  in the last 8760 hours CBG:  Recent Labs Lab 02/22/14 0808 02/23/14 0749  GLUCAP 134* 110*       Signed:  Kemesha Mosey A  [facility] 02/24/2014, 11:45 AM

## 2014-02-25 ENCOUNTER — Non-Acute Institutional Stay (SKILLED_NURSING_FACILITY): Payer: Medicare Other | Admitting: Internal Medicine

## 2014-02-25 DIAGNOSIS — S72002S Fracture of unspecified part of neck of left femur, sequela: Secondary | ICD-10-CM

## 2014-02-25 DIAGNOSIS — E039 Hypothyroidism, unspecified: Secondary | ICD-10-CM

## 2014-02-25 DIAGNOSIS — I519 Heart disease, unspecified: Secondary | ICD-10-CM | POA: Diagnosis not present

## 2014-02-25 DIAGNOSIS — I10 Essential (primary) hypertension: Secondary | ICD-10-CM | POA: Diagnosis not present

## 2014-02-25 DIAGNOSIS — S72009S Fracture of unspecified part of neck of unspecified femur, sequela: Secondary | ICD-10-CM | POA: Diagnosis not present

## 2014-02-25 NOTE — ED Provider Notes (Signed)
Medical screening examination/treatment/procedure(s) were conducted as a shared visit with non-physician practitioner(s) and myself.  I personally evaluated the patient during the encounter.   EKG Interpretation   Date/Time:  Friday February 21 2014 17:01:01 EDT Ventricular Rate:  81 PR Interval:  262 QRS Duration: 158 QT Interval:  433 QTC Calculation: 503 R Axis:   -51 Text Interpretation:  Sinus rhythm Prolonged PR interval Left bundle  branch block Confirmed by Jeneen Rinks  MD, [place] ([id number]) on 02/21/2014 5:17:49 PM      Please see my additional dictation regarding face to face evaluation of pt.  Tanna Furry, MD 02/25/14 [phone]
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
## 2014-03-03 DIAGNOSIS — M25559 Pain in unspecified hip: Secondary | ICD-10-CM | POA: Diagnosis not present

## 2014-03-07 ENCOUNTER — Non-Acute Institutional Stay (SKILLED_NURSING_FACILITY): Payer: Medicare Other | Admitting: Adult Health

## 2014-03-07 ENCOUNTER — Encounter: Payer: Self-pay | Admitting: Adult Health

## 2014-03-07 DIAGNOSIS — E039 Hypothyroidism, unspecified: Secondary | ICD-10-CM

## 2014-03-07 DIAGNOSIS — E876 Hypokalemia: Secondary | ICD-10-CM

## 2014-03-07 DIAGNOSIS — S72002S Fracture of unspecified part of neck of left femur, sequela: Secondary | ICD-10-CM

## 2014-03-07 DIAGNOSIS — I10 Essential (primary) hypertension: Secondary | ICD-10-CM

## 2014-03-07 DIAGNOSIS — F329 Major depressive disorder, single episode, unspecified: Secondary | ICD-10-CM | POA: Insufficient documentation

## 2014-03-07 DIAGNOSIS — K219 Gastro-esophageal reflux disease without esophagitis: Secondary | ICD-10-CM

## 2014-03-07 DIAGNOSIS — I519 Heart disease, unspecified: Secondary | ICD-10-CM | POA: Diagnosis not present

## 2014-03-07 DIAGNOSIS — S72009S Fracture of unspecified part of neck of unspecified femur, sequela: Secondary | ICD-10-CM

## 2014-03-07 DIAGNOSIS — F32A Depression, unspecified: Secondary | ICD-10-CM

## 2014-03-07 DIAGNOSIS — F3289 Other specified depressive episodes: Secondary | ICD-10-CM

## 2014-03-07 NOTE — Progress Notes (Signed)
Patient ID: [name], female   DOB: 09/30/1924, 80 y.o.   MRN: [id number]              PROGRESS NOTE  DATE: 03/07/2014   FACILITY: [facility]  LEVEL OF CARE: SNF (31)  Acute Visit  CHIEF COMPLAINT:  Discharge Notes  HISTORY OF PRESENT ILLNESS:  This is an 80 year old female who is for discharge home with Home health PT and OT. She has been admitted to [facility] on 02/24/14 from [facility] with Left femoral neck fracture S/P Left hip hemiarthroplasty. Patient was admitted to this facility for short-term rehabilitation after the patient's recent hospitalization.  Patient has completed SNF rehabilitation and therapy has cleared the patient for discharge.  Reassessment of ongoing problem(s):  HTN: Pt 's HTN remains stable.  Denies CP, sob, DOE, headaches, dizziness or visual disturbances.  No complications from the medications currently being used.  Last BP : 133/70  CHF:The patient does not relate significant weight changes, denies sob, DOE, orthopnea, PNDs, palpitations or chest pain.  CHF remains stable.  No complications form the medications being used.  GERD: pt's GERD is stable.  Denies ongoing heartburn, abd. Pain, nausea or vomiting.  Currently on a PPI & tolerates it without any adverse reactions.  PAST MEDICAL HISTORY : Reviewed.  No changes/see problem list  CURRENT MEDICATIONS: Reviewed per MAR/see medication list  REVIEW OF SYSTEMS:  GENERAL: no change in appetite, no fatigue, no weight changes, no fever, chills or weakness RESPIRATORY: no cough, SOB, DOE, wheezing, hemoptysis CARDIAC: no chest pain, edema or palpitations GI: no abdominal pain, diarrhea, constipation, heart burn, nausea or vomiting  PHYSICAL EXAMINATION  GENERAL: no acute distress, normal body habitus EYES: conjunctivae normal, sclerae normal, normal eye lids NECK: supple, trachea midline, no neck masses, no thyroid tenderness, no thyromegaly LYMPHATICS: no LAN in the  neck, no supraclavicular LAN RESPIRATORY: breathing is even & unlabored, BS CTAB CARDIAC: RRR, no murmur,no extra heart sounds, LLE edema 2+ GI: abdomen soft, normal BS, no masses, no tenderness, no hepatomegaly, no splenomegaly EXTREMITIES:  Able to move all 4 extremities; ambulates with a walker  PSYCHIATRIC: the patient is alert & oriented to person, affect & behavior appropriate  LABS/RADIOLOGY: 02/26/14  sodium 137 potassium 3.6 glucose 111 BUN 15 creatinine 0.8 calcium 8.8 Labs reviewed: Basic Metabolic Panel:  Recent Labs  02/21/14 2050 02/22/14 0505 02/23/14 0520 02/24/14 0443  NA 142 140 139 135*  K 5.1 4.8 4.0 3.7  CL 102 104 103 98  CO2 26 26 24 25   GLUCOSE 126* 139* 133* 124*  BUN 16 16 17 23   CREATININE 0.90 1.09 0.87 1.01  CALCIUM 10.6* 9.6 8.8 9.3  MG 2.3  --   --   --   PHOS 3.1  --   --   --    Liver Function Tests:  Recent Labs  02/21/14 1741 02/21/14 2050 02/22/14 0505  AST 31 30 27   ALT 21 21 17   ALKPHOS 78 84 65  BILITOT 0.4 0.6 0.7  PROT 7.3 7.7 6.4  ALBUMIN 3.9 4.1 3.2*   CBC:  Recent Labs  02/21/14 1741  02/22/14 0505 02/23/14 0520 02/24/14 0443  WBC 8.7  < > 11.2* 13.0* 11.9*  NEUTROABS 6.2  --   --   --   --   HGB 17.1*  < > 16.0* 14.3 13.6  HCT 50.0*  < > 48.0* 43.2 41.8  MCV 94.5  < > 95.8 95.2 95.7  PLT 163  < > 147* 147* 150  < > = values in this interval not displayed.  CBG:  Recent Labs  02/22/14 0808 02/23/14 0749  GLUCAP 134* 110*    Transthoracic Echocardiography  Patient:    [name], [name] MR #:       [id number] Study Date: 02/22/2014 Gender:     F Age:        70 Height:     167.6 cm Weight:     76.7 kg BSA:        1.91 [name]^2 Pt. Status: Room:   ADMITTING    [name], [name], [name], [name]  REFERRING    [name]  SONOGRAPHER  [name], RCS  PERFORMING   [name], [name]  cc:  ------------------------------------------------------------------- LV  EF: 55% -   60%  ------------------------------------------------------------------- History:   PMH:  Cardiac clearance for hip surgery.  ------------------------------------------------------------------- Study Conclusions  - Left ventricle: Severe LVH with small LVOT but no obvious SAM or   subvalvular gradient. The cavity size was normal. Wall thickness   was increased in a pattern of severe LVH. Systolic function was   normal. The estimated ejection fraction was in the range of 55%   to 60%. Doppler parameters are consistent with both elevated   ventricular end-diastolic filling pressure and elevated left   atrial filling pressure. - Atrial septum: No defect or patent foramen ovale was identified.  Transthoracic echocardiography.  [name]-mode, complete 2D, spectral Doppler, and color Doppler.  Birthdate:  Patient birthdate: Sep 05, 1924.  Age:  Patient is 80 yr old.  Sex:  Gender: female. BMI: 27.3 kg/[name]^2.  Blood pressure:     128/47  Patient status: [name].  Study date:  Study date: 02/22/2014. Study time: 08:54 AM.  -------------------------------------------------------------------  ------------------------------------------------------------------- Left ventricle:  Severe LVH with small LVOT but no obvious SAM or subvalvular gradient. The cavity size was normal. Wall thickness was increased in a pattern of severe LVH. Systolic function was normal. The estimated ejection fraction was in the range of 55% to 60%. Doppler parameters are consistent with both elevated ventricular end-diastolic filling pressure and elevated left atrial filling pressure.  ------------------------------------------------------------------- Aortic valve:   Mildly calcified leaflets.  Doppler:   There was no stenosis.      Peak gradient (S): 18 mm Hg.  ------------------------------------------------------------------- Aorta:  The aorta was normal, not dilated, and  non-diseased.  ------------------------------------------------------------------- Mitral valve:   Mildly thickened leaflets .  Doppler:  There was no significant regurgitation.    Peak gradient (D): 9 mm Hg.  ------------------------------------------------------------------- Left atrium:  The atrium was normal in size.  ------------------------------------------------------------------- Atrial septum:  No defect or patent foramen ovale was identified.   ------------------------------------------------------------------- Right ventricle:  The cavity size was normal. Wall thickness was normal. Systolic function was normal.  ------------------------------------------------------------------- Pulmonic valve:    Structurally normal valve.   Cusp separation was normal.  Doppler:  Transvalvular velocity was within the normal range. There was trivial regurgitation.  ------------------------------------------------------------------- Tricuspid valve:   Structurally normal valve.   Leaflet separation was normal.  Doppler:  Transvalvular velocity was within the normal range. There was mild regurgitation.  ------------------------------------------------------------------- Right atrium:  The atrium was normal in size.  ------------------------------------------------------------------- Pericardium:  The pericardium was normal in appearance.   ------------------------------------------------------------------- Post procedure conclusions Ascending Aorta:  - The aorta was normal, not dilated, and non-diseased.  ------------------------------------------------------------------- Measurements   Left ventricle  Value        12/20/2011 Reference  LV ID, ED, PLAX chordal  (L)     34.7  mm     28.8       43 - 52  LV ID, ES, PLAX chordal  (L)     20    mm     19.3       23 - 38  LV fx shortening, PLAX           42    %      33         >=29  chordal  LV PW thickness, ED               14.8  mm     15.1       ---------  IVS/LV PW ratio, ED              1.14         1.03       <=1.3  LV e&', lateral                   4.9   cm/s   3.7        ---------  LV E/e&', lateral                 30           33.51      ---------  LV e&', medial                    8.3   cm/s   5.26       ---------  LV E/e&', medial                  17.71        23.57      ---------  LV e&', average                   6.6   cm/s   ---------- ---------  LV E/e&', average                 22.27        ---------- ---------    Ventricular septum               Value        12/20/2011 Reference  IVS thickness, ED                16.9  mm     15.5       ---------    Aortic valve                     Value        12/20/2011 Reference  Aortic valve peak                214   cm/s   303        ---------  velocity, S  Aortic peak gradient, S          18    mm Hg  37         ---------    Aorta                            Value        12/20/2011 Reference  Aortic root ID, ED  35    mm     24         ---------  Ascending aorta ID, A-P,         33    mm     ---------- ---------  S    Left atrium                      Value        12/20/2011 Reference  LA ID, A-P, ES                   36    mm     33         ---------  LA ID/bsa, A-P                   1.89  cm/[name]^2 ---------- <=2.2    Mitral valve                     Value        12/20/2011 Reference  Mitral E-wave peak               147   cm/s   124        ---------  velocity  Mitral A-wave peak               219   cm/s   159        ---------  velocity  Mitral deceleration time (L)     120   ms     289        150 - 230  Mitral peak gradient, D          9     mm Hg  6          ---------  Mitral E/A ratio, peak           0.7          0.8        ---------    Tricuspid valve                  Value        12/20/2011 Reference  Tricuspid peak RV-RA             51    mm Hg  41         ---------  gradient  Tricuspid maximal regurg         356   cm/s    ---------- ---------  velocity, PISA    Right ventricle                  Value        12/20/2011 Reference  RV s&', lateral, S                14.7  cm/s   ---------- ---------  Legend: (L)  and  (H)  mark values outside specified reference range.  CHEST  2 VIEW   COMPARISON:  01/06/2012 radiographs; chest CT 01/03/2012.   FINDINGS: There is mild cardiomegaly and aortic atherosclerosis. The previously demonstrated interstitial opacities have largely cleared. There is probable mild underlying lung disease. There is no confluent airspace opacity or significant pleural effusion. Patient is status post right mastectomy and axillary node dissection. There is an old fracture of the mid right clavicle which appears stable. A nodular density overlaps the left lung apex on the frontal examination. This is  not seen with certainty on the prior radiographs. Patient did have fractures of the left upper ribs on prior CT, and this may be related to healed rib fractures.   IMPRESSION: 1. No acute cardiopulmonary process identified. Mild chronic lung disease suspected.   2. Nodular densities overlapping the left apex may be related to old rib fractures. Is difficult to completely exclude a pulmonary nodule. Suggest apical lordotic view for further evaluation.   LEFT HIP - COMPLETE 2+ VIEW   COMPARISON:  12/19/2011   FINDINGS: Osseous demineralization.   Minimal narrowing of hip joints.   SI joints preserved.   Displaced LEFT femoral neck fracture without dislocation.   Pelvis appears intact.   Degenerative disc disease changes lower lumbar spine.   IMPRESSION: Displaced LEFT femoral neck fracture without dislocation.   Osseous demineralization.   PORTABLE PELVIS 1-2 VIEWS   COMPARISON:  CT abdomen and pelvis December 19, 2011   FINDINGS: There is a total hip prosthesis on the left which appears well seated on this single view. There is surrounding soft tissue air which may be  an expected postoperative finding. There is no acute fracture or dislocation. There is moderate narrowing in the right hip joint region. There are metallic foreign bodies in the region of the right hip joint, stable from 2013.   IMPRESSION: Prosthesis on the left appears well-seated. No acute fracture dislocation. Moderate osteoarthritic change right hip joint.    ASSESSMENT/PLAN:  Left femoral neck fracture status post left hip hemiarthroplasty - 4 home health PT and OT Hypertension - well controlled; continue metoprolol Chronic diastolic heart failure - stable; continue Lasix Hypothyroidism - continue Synthroid GERD - stable; continue Zantac and Nexium Depression - stable; continue Tofranil Hypokalemia - continue potassium supplementation   I have filled out patient's discharge paperwork and written prescriptions.  Patient will receive home health PT and OT.   Total discharge time: Less than 30 minutes  Discharge time involved coordination of the discharge process with Education officer, museum, nursing staff and therapy department. Medical justification for home health services verified.  CPT CODE: 10175  Seth Bake - NP [facility] [phone]

## 2014-03-11 DIAGNOSIS — F329 Major depressive disorder, single episode, unspecified: Secondary | ICD-10-CM | POA: Diagnosis not present

## 2014-03-11 DIAGNOSIS — F411 Generalized anxiety disorder: Secondary | ICD-10-CM | POA: Diagnosis not present

## 2014-03-11 DIAGNOSIS — I1310 Hypertensive heart and chronic kidney disease without heart failure, with stage 1 through stage 4 chronic kidney disease, or unspecified chronic kidney disease: Secondary | ICD-10-CM | POA: Diagnosis not present

## 2014-03-11 DIAGNOSIS — G589 Mononeuropathy, unspecified: Secondary | ICD-10-CM | POA: Diagnosis not present

## 2014-03-11 DIAGNOSIS — G4700 Insomnia, unspecified: Secondary | ICD-10-CM | POA: Diagnosis not present

## 2014-03-11 DIAGNOSIS — R269 Unspecified abnormalities of gait and mobility: Secondary | ICD-10-CM | POA: Diagnosis not present

## 2014-03-11 DIAGNOSIS — E039 Hypothyroidism, unspecified: Secondary | ICD-10-CM | POA: Diagnosis not present

## 2014-03-11 DIAGNOSIS — G894 Chronic pain syndrome: Secondary | ICD-10-CM | POA: Diagnosis not present

## 2014-03-13 DIAGNOSIS — I872 Venous insufficiency (chronic) (peripheral): Secondary | ICD-10-CM | POA: Diagnosis not present

## 2014-03-13 DIAGNOSIS — I10 Essential (primary) hypertension: Secondary | ICD-10-CM | POA: Diagnosis not present

## 2014-03-13 DIAGNOSIS — Z471 Aftercare following joint replacement surgery: Secondary | ICD-10-CM | POA: Diagnosis not present

## 2014-03-13 DIAGNOSIS — I5032 Chronic diastolic (congestive) heart failure: Secondary | ICD-10-CM | POA: Diagnosis not present

## 2014-03-13 DIAGNOSIS — R269 Unspecified abnormalities of gait and mobility: Secondary | ICD-10-CM | POA: Diagnosis not present

## 2014-03-13 DIAGNOSIS — I739 Peripheral vascular disease, unspecified: Secondary | ICD-10-CM | POA: Diagnosis not present

## 2014-03-14 DIAGNOSIS — I872 Venous insufficiency (chronic) (peripheral): Secondary | ICD-10-CM | POA: Diagnosis not present

## 2014-03-14 DIAGNOSIS — I5032 Chronic diastolic (congestive) heart failure: Secondary | ICD-10-CM | POA: Diagnosis not present

## 2014-03-14 DIAGNOSIS — I10 Essential (primary) hypertension: Secondary | ICD-10-CM | POA: Diagnosis not present

## 2014-03-14 DIAGNOSIS — R269 Unspecified abnormalities of gait and mobility: Secondary | ICD-10-CM | POA: Diagnosis not present

## 2014-03-14 DIAGNOSIS — Z471 Aftercare following joint replacement surgery: Secondary | ICD-10-CM | POA: Diagnosis not present

## 2014-03-14 DIAGNOSIS — I739 Peripheral vascular disease, unspecified: Secondary | ICD-10-CM | POA: Diagnosis not present

## 2014-03-17 DIAGNOSIS — R269 Unspecified abnormalities of gait and mobility: Secondary | ICD-10-CM | POA: Diagnosis not present

## 2014-03-17 DIAGNOSIS — I10 Essential (primary) hypertension: Secondary | ICD-10-CM | POA: Diagnosis not present

## 2014-03-17 DIAGNOSIS — I872 Venous insufficiency (chronic) (peripheral): Secondary | ICD-10-CM | POA: Diagnosis not present

## 2014-03-17 DIAGNOSIS — Z471 Aftercare following joint replacement surgery: Secondary | ICD-10-CM | POA: Diagnosis not present

## 2014-03-17 DIAGNOSIS — I739 Peripheral vascular disease, unspecified: Secondary | ICD-10-CM | POA: Diagnosis not present

## 2014-03-17 DIAGNOSIS — I5032 Chronic diastolic (congestive) heart failure: Secondary | ICD-10-CM | POA: Diagnosis not present

## 2014-03-19 DIAGNOSIS — I10 Essential (primary) hypertension: Secondary | ICD-10-CM | POA: Diagnosis not present

## 2014-03-19 DIAGNOSIS — I872 Venous insufficiency (chronic) (peripheral): Secondary | ICD-10-CM | POA: Diagnosis not present

## 2014-03-19 DIAGNOSIS — I5032 Chronic diastolic (congestive) heart failure: Secondary | ICD-10-CM | POA: Diagnosis not present

## 2014-03-19 DIAGNOSIS — Z471 Aftercare following joint replacement surgery: Secondary | ICD-10-CM | POA: Diagnosis not present

## 2014-03-19 DIAGNOSIS — I739 Peripheral vascular disease, unspecified: Secondary | ICD-10-CM | POA: Diagnosis not present

## 2014-03-19 DIAGNOSIS — R269 Unspecified abnormalities of gait and mobility: Secondary | ICD-10-CM | POA: Diagnosis not present

## 2014-03-21 DIAGNOSIS — I872 Venous insufficiency (chronic) (peripheral): Secondary | ICD-10-CM | POA: Diagnosis not present

## 2014-03-21 DIAGNOSIS — I5032 Chronic diastolic (congestive) heart failure: Secondary | ICD-10-CM | POA: Diagnosis not present

## 2014-03-21 DIAGNOSIS — R269 Unspecified abnormalities of gait and mobility: Secondary | ICD-10-CM | POA: Diagnosis not present

## 2014-03-21 DIAGNOSIS — Z471 Aftercare following joint replacement surgery: Secondary | ICD-10-CM | POA: Diagnosis not present

## 2014-03-21 DIAGNOSIS — I739 Peripheral vascular disease, unspecified: Secondary | ICD-10-CM | POA: Diagnosis not present

## 2014-03-21 DIAGNOSIS — I10 Essential (primary) hypertension: Secondary | ICD-10-CM | POA: Diagnosis not present

## 2014-03-24 DIAGNOSIS — I10 Essential (primary) hypertension: Secondary | ICD-10-CM | POA: Diagnosis not present

## 2014-03-24 DIAGNOSIS — R269 Unspecified abnormalities of gait and mobility: Secondary | ICD-10-CM | POA: Diagnosis not present

## 2014-03-24 DIAGNOSIS — I739 Peripheral vascular disease, unspecified: Secondary | ICD-10-CM | POA: Diagnosis not present

## 2014-03-24 DIAGNOSIS — I5032 Chronic diastolic (congestive) heart failure: Secondary | ICD-10-CM | POA: Diagnosis not present

## 2014-03-24 DIAGNOSIS — I872 Venous insufficiency (chronic) (peripheral): Secondary | ICD-10-CM | POA: Diagnosis not present

## 2014-03-24 DIAGNOSIS — Z471 Aftercare following joint replacement surgery: Secondary | ICD-10-CM | POA: Diagnosis not present

## 2014-03-25 DIAGNOSIS — I5032 Chronic diastolic (congestive) heart failure: Secondary | ICD-10-CM | POA: Diagnosis not present

## 2014-03-25 DIAGNOSIS — I872 Venous insufficiency (chronic) (peripheral): Secondary | ICD-10-CM | POA: Diagnosis not present

## 2014-03-25 DIAGNOSIS — I739 Peripheral vascular disease, unspecified: Secondary | ICD-10-CM | POA: Diagnosis not present

## 2014-03-25 DIAGNOSIS — R269 Unspecified abnormalities of gait and mobility: Secondary | ICD-10-CM | POA: Diagnosis not present

## 2014-03-25 DIAGNOSIS — Z471 Aftercare following joint replacement surgery: Secondary | ICD-10-CM | POA: Diagnosis not present

## 2014-03-25 DIAGNOSIS — I10 Essential (primary) hypertension: Secondary | ICD-10-CM | POA: Diagnosis not present

## 2014-03-26 DIAGNOSIS — Z471 Aftercare following joint replacement surgery: Secondary | ICD-10-CM | POA: Diagnosis not present

## 2014-03-26 DIAGNOSIS — I10 Essential (primary) hypertension: Secondary | ICD-10-CM | POA: Diagnosis not present

## 2014-03-26 DIAGNOSIS — I5032 Chronic diastolic (congestive) heart failure: Secondary | ICD-10-CM | POA: Diagnosis not present

## 2014-03-26 DIAGNOSIS — I872 Venous insufficiency (chronic) (peripheral): Secondary | ICD-10-CM | POA: Diagnosis not present

## 2014-03-26 DIAGNOSIS — I739 Peripheral vascular disease, unspecified: Secondary | ICD-10-CM | POA: Diagnosis not present

## 2014-03-26 DIAGNOSIS — R269 Unspecified abnormalities of gait and mobility: Secondary | ICD-10-CM | POA: Diagnosis not present

## 2014-03-27 DIAGNOSIS — R269 Unspecified abnormalities of gait and mobility: Secondary | ICD-10-CM | POA: Diagnosis not present

## 2014-03-27 DIAGNOSIS — I872 Venous insufficiency (chronic) (peripheral): Secondary | ICD-10-CM | POA: Diagnosis not present

## 2014-03-27 DIAGNOSIS — I739 Peripheral vascular disease, unspecified: Secondary | ICD-10-CM | POA: Diagnosis not present

## 2014-03-27 DIAGNOSIS — Z471 Aftercare following joint replacement surgery: Secondary | ICD-10-CM | POA: Diagnosis not present

## 2014-03-27 DIAGNOSIS — I5032 Chronic diastolic (congestive) heart failure: Secondary | ICD-10-CM | POA: Diagnosis not present

## 2014-03-27 DIAGNOSIS — I10 Essential (primary) hypertension: Secondary | ICD-10-CM | POA: Diagnosis not present

## 2014-03-31 DIAGNOSIS — Z471 Aftercare following joint replacement surgery: Secondary | ICD-10-CM | POA: Diagnosis not present

## 2014-03-31 DIAGNOSIS — I872 Venous insufficiency (chronic) (peripheral): Secondary | ICD-10-CM | POA: Diagnosis not present

## 2014-03-31 DIAGNOSIS — I10 Essential (primary) hypertension: Secondary | ICD-10-CM | POA: Diagnosis not present

## 2014-03-31 DIAGNOSIS — R269 Unspecified abnormalities of gait and mobility: Secondary | ICD-10-CM | POA: Diagnosis not present

## 2014-03-31 DIAGNOSIS — I739 Peripheral vascular disease, unspecified: Secondary | ICD-10-CM | POA: Diagnosis not present

## 2014-03-31 DIAGNOSIS — I5032 Chronic diastolic (congestive) heart failure: Secondary | ICD-10-CM | POA: Diagnosis not present

## 2014-04-01 DIAGNOSIS — Z471 Aftercare following joint replacement surgery: Secondary | ICD-10-CM | POA: Diagnosis not present

## 2014-04-01 DIAGNOSIS — R269 Unspecified abnormalities of gait and mobility: Secondary | ICD-10-CM | POA: Diagnosis not present

## 2014-04-01 DIAGNOSIS — I872 Venous insufficiency (chronic) (peripheral): Secondary | ICD-10-CM | POA: Diagnosis not present

## 2014-04-01 DIAGNOSIS — I10 Essential (primary) hypertension: Secondary | ICD-10-CM | POA: Diagnosis not present

## 2014-04-01 DIAGNOSIS — I739 Peripheral vascular disease, unspecified: Secondary | ICD-10-CM | POA: Diagnosis not present

## 2014-04-01 DIAGNOSIS — I5032 Chronic diastolic (congestive) heart failure: Secondary | ICD-10-CM | POA: Diagnosis not present

## 2014-04-02 DIAGNOSIS — M25569 Pain in unspecified knee: Secondary | ICD-10-CM | POA: Diagnosis not present

## 2014-04-03 DIAGNOSIS — I739 Peripheral vascular disease, unspecified: Secondary | ICD-10-CM | POA: Diagnosis not present

## 2014-04-03 DIAGNOSIS — I872 Venous insufficiency (chronic) (peripheral): Secondary | ICD-10-CM | POA: Diagnosis not present

## 2014-04-03 DIAGNOSIS — I10 Essential (primary) hypertension: Secondary | ICD-10-CM | POA: Diagnosis not present

## 2014-04-03 DIAGNOSIS — I5032 Chronic diastolic (congestive) heart failure: Secondary | ICD-10-CM | POA: Diagnosis not present

## 2014-04-03 DIAGNOSIS — R269 Unspecified abnormalities of gait and mobility: Secondary | ICD-10-CM | POA: Diagnosis not present

## 2014-04-03 DIAGNOSIS — Z471 Aftercare following joint replacement surgery: Secondary | ICD-10-CM | POA: Diagnosis not present

## 2014-04-07 ENCOUNTER — Other Ambulatory Visit (HOSPITAL_COMMUNITY): Payer: Self-pay | Admitting: Orthopedic Surgery

## 2014-04-07 DIAGNOSIS — M25562 Pain in left knee: Secondary | ICD-10-CM

## 2014-04-10 DIAGNOSIS — M6281 Muscle weakness (generalized): Secondary | ICD-10-CM | POA: Diagnosis not present

## 2014-04-10 DIAGNOSIS — R262 Difficulty in walking, not elsewhere classified: Secondary | ICD-10-CM | POA: Diagnosis not present

## 2014-04-10 DIAGNOSIS — M25552 Pain in left hip: Secondary | ICD-10-CM | POA: Diagnosis not present

## 2014-04-11 DIAGNOSIS — M6281 Muscle weakness (generalized): Secondary | ICD-10-CM | POA: Diagnosis not present

## 2014-04-11 DIAGNOSIS — M25552 Pain in left hip: Secondary | ICD-10-CM | POA: Diagnosis not present

## 2014-04-11 DIAGNOSIS — R262 Difficulty in walking, not elsewhere classified: Secondary | ICD-10-CM | POA: Diagnosis not present

## 2014-04-14 DIAGNOSIS — R262 Difficulty in walking, not elsewhere classified: Secondary | ICD-10-CM | POA: Diagnosis not present

## 2014-04-14 DIAGNOSIS — M25552 Pain in left hip: Secondary | ICD-10-CM | POA: Diagnosis not present

## 2014-04-14 DIAGNOSIS — M6281 Muscle weakness (generalized): Secondary | ICD-10-CM | POA: Diagnosis not present

## 2014-04-15 ENCOUNTER — Encounter (HOSPITAL_COMMUNITY)
Admission: RE | Admit: 2014-04-15 | Discharge: 2014-04-15 | Disposition: A | Discharge status: Home / Self Care | Payer: Medicare Other | Source: Ambulatory Visit | Attending: Orthopedic Surgery | Admitting: Orthopedic Surgery

## 2014-04-15 DIAGNOSIS — H6123 Impacted cerumen, bilateral: Secondary | ICD-10-CM | POA: Diagnosis not present

## 2014-04-15 DIAGNOSIS — M1711 Unilateral primary osteoarthritis, right knee: Secondary | ICD-10-CM | POA: Diagnosis not present

## 2014-04-15 DIAGNOSIS — G894 Chronic pain syndrome: Secondary | ICD-10-CM | POA: Diagnosis not present

## 2014-04-15 DIAGNOSIS — M25562 Pain in left knee: Secondary | ICD-10-CM | POA: Diagnosis not present

## 2014-04-15 DIAGNOSIS — I1310 Hypertensive heart and chronic kidney disease without heart failure, with stage 1 through stage 4 chronic kidney disease, or unspecified chronic kidney disease: Secondary | ICD-10-CM | POA: Diagnosis not present

## 2014-04-15 DIAGNOSIS — F419 Anxiety disorder, unspecified: Secondary | ICD-10-CM | POA: Diagnosis not present

## 2014-04-15 DIAGNOSIS — G4700 Insomnia, unspecified: Secondary | ICD-10-CM | POA: Diagnosis not present

## 2014-04-15 DIAGNOSIS — F329 Major depressive disorder, single episode, unspecified: Secondary | ICD-10-CM | POA: Diagnosis not present

## 2014-04-15 DIAGNOSIS — E039 Hypothyroidism, unspecified: Secondary | ICD-10-CM | POA: Diagnosis not present

## 2014-04-15 MED ORDER — TECHNETIUM TC 99M MEDRONATE IV KIT
26.8000 | PACK | Freq: Once | INTRAVENOUS | Status: AC | PRN
  Administered 2014-04-15: 26.8 via INTRAVENOUS

## 2014-04-18 DIAGNOSIS — R262 Difficulty in walking, not elsewhere classified: Secondary | ICD-10-CM | POA: Diagnosis not present

## 2014-04-18 DIAGNOSIS — M25552 Pain in left hip: Secondary | ICD-10-CM | POA: Diagnosis not present

## 2014-04-18 DIAGNOSIS — M6281 Muscle weakness (generalized): Secondary | ICD-10-CM | POA: Diagnosis not present

## 2014-04-21 DIAGNOSIS — R262 Difficulty in walking, not elsewhere classified: Secondary | ICD-10-CM | POA: Diagnosis not present

## 2014-04-21 DIAGNOSIS — M25552 Pain in left hip: Secondary | ICD-10-CM | POA: Diagnosis not present

## 2014-04-21 DIAGNOSIS — M6281 Muscle weakness (generalized): Secondary | ICD-10-CM | POA: Diagnosis not present

## 2014-04-22 DIAGNOSIS — R262 Difficulty in walking, not elsewhere classified: Secondary | ICD-10-CM | POA: Diagnosis not present

## 2014-04-22 DIAGNOSIS — M25552 Pain in left hip: Secondary | ICD-10-CM | POA: Diagnosis not present

## 2014-04-22 DIAGNOSIS — M6281 Muscle weakness (generalized): Secondary | ICD-10-CM | POA: Diagnosis not present

## 2014-04-25 DIAGNOSIS — M25552 Pain in left hip: Secondary | ICD-10-CM | POA: Diagnosis not present

## 2014-04-25 DIAGNOSIS — M6281 Muscle weakness (generalized): Secondary | ICD-10-CM | POA: Diagnosis not present

## 2014-04-25 DIAGNOSIS — R262 Difficulty in walking, not elsewhere classified: Secondary | ICD-10-CM | POA: Diagnosis not present

## 2014-04-29 DIAGNOSIS — M6281 Muscle weakness (generalized): Secondary | ICD-10-CM | POA: Diagnosis not present

## 2014-04-29 DIAGNOSIS — R262 Difficulty in walking, not elsewhere classified: Secondary | ICD-10-CM | POA: Diagnosis not present

## 2014-04-29 DIAGNOSIS — M25552 Pain in left hip: Secondary | ICD-10-CM | POA: Diagnosis not present

## 2014-04-30 ENCOUNTER — Ambulatory Visit: Payer: Medicare Other | Admitting: Family

## 2014-04-30 DIAGNOSIS — M25552 Pain in left hip: Secondary | ICD-10-CM | POA: Diagnosis not present

## 2014-04-30 DIAGNOSIS — R262 Difficulty in walking, not elsewhere classified: Secondary | ICD-10-CM | POA: Diagnosis not present

## 2014-04-30 DIAGNOSIS — M6281 Muscle weakness (generalized): Secondary | ICD-10-CM | POA: Diagnosis not present

## 2014-05-02 DIAGNOSIS — M6281 Muscle weakness (generalized): Secondary | ICD-10-CM | POA: Diagnosis not present

## 2014-05-02 DIAGNOSIS — M25552 Pain in left hip: Secondary | ICD-10-CM | POA: Diagnosis not present

## 2014-05-02 DIAGNOSIS — R262 Difficulty in walking, not elsewhere classified: Secondary | ICD-10-CM | POA: Diagnosis not present

## 2014-05-06 DIAGNOSIS — R262 Difficulty in walking, not elsewhere classified: Secondary | ICD-10-CM | POA: Diagnosis not present

## 2014-05-06 DIAGNOSIS — M6281 Muscle weakness (generalized): Secondary | ICD-10-CM | POA: Diagnosis not present

## 2014-05-06 DIAGNOSIS — M25552 Pain in left hip: Secondary | ICD-10-CM | POA: Diagnosis not present

## 2014-05-07 DIAGNOSIS — M6281 Muscle weakness (generalized): Secondary | ICD-10-CM | POA: Diagnosis not present

## 2014-05-07 DIAGNOSIS — R262 Difficulty in walking, not elsewhere classified: Secondary | ICD-10-CM | POA: Diagnosis not present

## 2014-05-07 DIAGNOSIS — M25552 Pain in left hip: Secondary | ICD-10-CM | POA: Diagnosis not present

## 2014-05-08 DIAGNOSIS — M6281 Muscle weakness (generalized): Secondary | ICD-10-CM | POA: Diagnosis not present

## 2014-05-08 DIAGNOSIS — R262 Difficulty in walking, not elsewhere classified: Secondary | ICD-10-CM | POA: Diagnosis not present

## 2014-05-08 DIAGNOSIS — M25552 Pain in left hip: Secondary | ICD-10-CM | POA: Diagnosis not present

## 2014-05-19 DIAGNOSIS — R262 Difficulty in walking, not elsewhere classified: Secondary | ICD-10-CM | POA: Diagnosis not present

## 2014-05-19 DIAGNOSIS — M6281 Muscle weakness (generalized): Secondary | ICD-10-CM | POA: Diagnosis not present

## 2014-05-19 DIAGNOSIS — M25552 Pain in left hip: Secondary | ICD-10-CM | POA: Diagnosis not present

## 2014-05-20 DIAGNOSIS — M6281 Muscle weakness (generalized): Secondary | ICD-10-CM | POA: Diagnosis not present

## 2014-05-20 DIAGNOSIS — R262 Difficulty in walking, not elsewhere classified: Secondary | ICD-10-CM | POA: Diagnosis not present

## 2014-05-20 DIAGNOSIS — M25552 Pain in left hip: Secondary | ICD-10-CM | POA: Diagnosis not present

## 2014-05-22 DIAGNOSIS — M6281 Muscle weakness (generalized): Secondary | ICD-10-CM | POA: Diagnosis not present

## 2014-05-22 DIAGNOSIS — R262 Difficulty in walking, not elsewhere classified: Secondary | ICD-10-CM | POA: Diagnosis not present

## 2014-05-22 DIAGNOSIS — M25552 Pain in left hip: Secondary | ICD-10-CM | POA: Diagnosis not present

## 2014-05-23 DIAGNOSIS — R262 Difficulty in walking, not elsewhere classified: Secondary | ICD-10-CM | POA: Diagnosis not present

## 2014-05-23 DIAGNOSIS — M6281 Muscle weakness (generalized): Secondary | ICD-10-CM | POA: Diagnosis not present

## 2014-05-23 DIAGNOSIS — M25552 Pain in left hip: Secondary | ICD-10-CM | POA: Diagnosis not present

## 2014-05-26 DIAGNOSIS — M6281 Muscle weakness (generalized): Secondary | ICD-10-CM | POA: Diagnosis not present

## 2014-05-26 DIAGNOSIS — M25552 Pain in left hip: Secondary | ICD-10-CM | POA: Diagnosis not present

## 2014-05-26 DIAGNOSIS — R262 Difficulty in walking, not elsewhere classified: Secondary | ICD-10-CM | POA: Diagnosis not present

## 2014-05-27 DIAGNOSIS — E782 Mixed hyperlipidemia: Secondary | ICD-10-CM | POA: Diagnosis not present

## 2014-05-27 DIAGNOSIS — E538 Deficiency of other specified B group vitamins: Secondary | ICD-10-CM | POA: Diagnosis not present

## 2014-05-27 DIAGNOSIS — E1129 Type 2 diabetes mellitus with other diabetic kidney complication: Secondary | ICD-10-CM | POA: Diagnosis not present

## 2014-05-27 DIAGNOSIS — G589 Mononeuropathy, unspecified: Secondary | ICD-10-CM | POA: Diagnosis not present

## 2014-05-27 DIAGNOSIS — G894 Chronic pain syndrome: Secondary | ICD-10-CM | POA: Diagnosis not present

## 2014-05-27 DIAGNOSIS — F419 Anxiety disorder, unspecified: Secondary | ICD-10-CM | POA: Diagnosis not present

## 2014-05-27 DIAGNOSIS — F329 Major depressive disorder, single episode, unspecified: Secondary | ICD-10-CM | POA: Diagnosis not present

## 2014-05-27 DIAGNOSIS — I1310 Hypertensive heart and chronic kidney disease without heart failure, with stage 1 through stage 4 chronic kidney disease, or unspecified chronic kidney disease: Secondary | ICD-10-CM | POA: Diagnosis not present

## 2014-05-27 DIAGNOSIS — G4700 Insomnia, unspecified: Secondary | ICD-10-CM | POA: Diagnosis not present

## 2014-05-27 DIAGNOSIS — I10 Essential (primary) hypertension: Secondary | ICD-10-CM | POA: Diagnosis not present

## 2014-05-27 DIAGNOSIS — E039 Hypothyroidism, unspecified: Secondary | ICD-10-CM | POA: Diagnosis not present

## 2014-05-27 DIAGNOSIS — E559 Vitamin D deficiency, unspecified: Secondary | ICD-10-CM | POA: Diagnosis not present

## 2014-05-27 DIAGNOSIS — N183 Chronic kidney disease, stage 3 (moderate): Secondary | ICD-10-CM | POA: Diagnosis not present

## 2014-05-28 DIAGNOSIS — M25552 Pain in left hip: Secondary | ICD-10-CM | POA: Diagnosis not present

## 2014-05-28 DIAGNOSIS — M6281 Muscle weakness (generalized): Secondary | ICD-10-CM | POA: Diagnosis not present

## 2014-05-28 DIAGNOSIS — R262 Difficulty in walking, not elsewhere classified: Secondary | ICD-10-CM | POA: Diagnosis not present

## 2014-06-02 DIAGNOSIS — M6281 Muscle weakness (generalized): Secondary | ICD-10-CM | POA: Diagnosis not present

## 2014-06-02 DIAGNOSIS — R262 Difficulty in walking, not elsewhere classified: Secondary | ICD-10-CM | POA: Diagnosis not present

## 2014-06-02 DIAGNOSIS — M25552 Pain in left hip: Secondary | ICD-10-CM | POA: Diagnosis not present

## 2014-06-05 DIAGNOSIS — M6281 Muscle weakness (generalized): Secondary | ICD-10-CM | POA: Diagnosis not present

## 2014-06-05 DIAGNOSIS — M25552 Pain in left hip: Secondary | ICD-10-CM | POA: Diagnosis not present

## 2014-06-05 DIAGNOSIS — R262 Difficulty in walking, not elsewhere classified: Secondary | ICD-10-CM | POA: Diagnosis not present

## 2014-06-06 DIAGNOSIS — M25552 Pain in left hip: Secondary | ICD-10-CM | POA: Diagnosis not present

## 2014-06-06 DIAGNOSIS — R262 Difficulty in walking, not elsewhere classified: Secondary | ICD-10-CM | POA: Diagnosis not present

## 2014-06-06 DIAGNOSIS — M6281 Muscle weakness (generalized): Secondary | ICD-10-CM | POA: Diagnosis not present

## 2014-06-09 DIAGNOSIS — M545 Low back pain: Secondary | ICD-10-CM | POA: Diagnosis not present

## 2014-06-09 DIAGNOSIS — S72002D Fracture of unspecified part of neck of left femur, subsequent encounter for closed fracture with routine healing: Secondary | ICD-10-CM | POA: Diagnosis not present

## 2014-06-19 ENCOUNTER — Other Ambulatory Visit: Payer: Self-pay | Admitting: Orthopedic Surgery

## 2014-06-19 DIAGNOSIS — M545 Low back pain, unspecified: Secondary | ICD-10-CM

## 2014-07-01 ENCOUNTER — Ambulatory Visit
Admission: RE | Admit: 2014-07-01 | Discharge: 2014-07-01 | Disposition: A | Discharge status: Home / Self Care | Payer: Medicare Other | Source: Ambulatory Visit | Attending: Orthopedic Surgery | Admitting: Orthopedic Surgery

## 2014-07-01 DIAGNOSIS — M5136 Other intervertebral disc degeneration, lumbar region: Secondary | ICD-10-CM | POA: Diagnosis not present

## 2014-07-01 DIAGNOSIS — M545 Low back pain, unspecified: Secondary | ICD-10-CM

## 2014-07-01 DIAGNOSIS — Z853 Personal history of malignant neoplasm of breast: Secondary | ICD-10-CM | POA: Diagnosis not present

## 2014-07-01 DIAGNOSIS — M4186 Other forms of scoliosis, lumbar region: Secondary | ICD-10-CM | POA: Diagnosis not present

## 2014-07-07 DIAGNOSIS — M4180 Other forms of scoliosis, site unspecified: Secondary | ICD-10-CM | POA: Diagnosis not present

## 2014-07-07 DIAGNOSIS — M5137 Other intervertebral disc degeneration, lumbosacral region: Secondary | ICD-10-CM | POA: Diagnosis not present

## 2014-07-14 DIAGNOSIS — M4180 Other forms of scoliosis, site unspecified: Secondary | ICD-10-CM | POA: Diagnosis not present

## 2014-07-14 DIAGNOSIS — M5137 Other intervertebral disc degeneration, lumbosacral region: Secondary | ICD-10-CM | POA: Diagnosis not present

## 2014-07-15 ENCOUNTER — Other Ambulatory Visit: Payer: Self-pay | Admitting: Adult Health

## 2014-07-15 DIAGNOSIS — G4700 Insomnia, unspecified: Secondary | ICD-10-CM | POA: Diagnosis not present

## 2014-07-15 DIAGNOSIS — E039 Hypothyroidism, unspecified: Secondary | ICD-10-CM | POA: Diagnosis not present

## 2014-07-15 DIAGNOSIS — I1310 Hypertensive heart and chronic kidney disease without heart failure, with stage 1 through stage 4 chronic kidney disease, or unspecified chronic kidney disease: Secondary | ICD-10-CM | POA: Diagnosis not present

## 2014-07-15 DIAGNOSIS — F329 Major depressive disorder, single episode, unspecified: Secondary | ICD-10-CM | POA: Diagnosis not present

## 2014-07-15 DIAGNOSIS — I6930 Unspecified sequelae of cerebral infarction: Secondary | ICD-10-CM | POA: Diagnosis not present

## 2014-07-15 DIAGNOSIS — G894 Chronic pain syndrome: Secondary | ICD-10-CM | POA: Diagnosis not present

## 2014-07-15 DIAGNOSIS — H6123 Impacted cerumen, bilateral: Secondary | ICD-10-CM | POA: Diagnosis not present

## 2014-07-15 DIAGNOSIS — F419 Anxiety disorder, unspecified: Secondary | ICD-10-CM | POA: Diagnosis not present

## 2014-08-04 DIAGNOSIS — M961 Postlaminectomy syndrome, not elsewhere classified: Secondary | ICD-10-CM | POA: Diagnosis not present

## 2014-08-04 DIAGNOSIS — M5416 Radiculopathy, lumbar region: Secondary | ICD-10-CM | POA: Diagnosis not present

## 2014-08-04 DIAGNOSIS — M4726 Other spondylosis with radiculopathy, lumbar region: Secondary | ICD-10-CM | POA: Diagnosis not present

## 2014-08-07 ENCOUNTER — Other Ambulatory Visit: Payer: Self-pay | Admitting: Pulmonary Disease

## 2014-08-13 DIAGNOSIS — H4011X1 Primary open-angle glaucoma, mild stage: Secondary | ICD-10-CM | POA: Diagnosis not present

## 2014-08-13 DIAGNOSIS — H1851 Endothelial corneal dystrophy: Secondary | ICD-10-CM | POA: Diagnosis not present

## 2014-08-13 DIAGNOSIS — Z961 Presence of intraocular lens: Secondary | ICD-10-CM | POA: Diagnosis not present

## 2014-08-13 DIAGNOSIS — H04123 Dry eye syndrome of bilateral lacrimal glands: Secondary | ICD-10-CM | POA: Diagnosis not present

## 2014-08-13 DIAGNOSIS — H4011X2 Primary open-angle glaucoma, moderate stage: Secondary | ICD-10-CM | POA: Diagnosis not present

## 2014-08-26 DIAGNOSIS — E039 Hypothyroidism, unspecified: Secondary | ICD-10-CM | POA: Diagnosis not present

## 2014-08-26 DIAGNOSIS — I10 Essential (primary) hypertension: Secondary | ICD-10-CM | POA: Diagnosis not present

## 2014-08-26 DIAGNOSIS — N183 Chronic kidney disease, stage 3 (moderate): Secondary | ICD-10-CM | POA: Diagnosis not present

## 2014-08-26 DIAGNOSIS — E559 Vitamin D deficiency, unspecified: Secondary | ICD-10-CM | POA: Diagnosis not present

## 2014-08-26 DIAGNOSIS — R195 Other fecal abnormalities: Secondary | ICD-10-CM | POA: Diagnosis not present

## 2014-08-26 DIAGNOSIS — F419 Anxiety disorder, unspecified: Secondary | ICD-10-CM | POA: Diagnosis not present

## 2014-08-26 DIAGNOSIS — K219 Gastro-esophageal reflux disease without esophagitis: Secondary | ICD-10-CM | POA: Diagnosis not present

## 2014-08-26 DIAGNOSIS — G894 Chronic pain syndrome: Secondary | ICD-10-CM | POA: Diagnosis not present

## 2014-08-26 DIAGNOSIS — G4700 Insomnia, unspecified: Secondary | ICD-10-CM | POA: Diagnosis not present

## 2014-08-26 DIAGNOSIS — I1310 Hypertensive heart and chronic kidney disease without heart failure, with stage 1 through stage 4 chronic kidney disease, or unspecified chronic kidney disease: Secondary | ICD-10-CM | POA: Diagnosis not present

## 2014-08-26 DIAGNOSIS — E119 Type 2 diabetes mellitus without complications: Secondary | ICD-10-CM | POA: Diagnosis not present

## 2014-08-26 DIAGNOSIS — E538 Deficiency of other specified B group vitamins: Secondary | ICD-10-CM | POA: Diagnosis not present

## 2014-08-26 DIAGNOSIS — E782 Mixed hyperlipidemia: Secondary | ICD-10-CM | POA: Diagnosis not present

## 2014-09-08 ENCOUNTER — Other Ambulatory Visit: Payer: Self-pay | Admitting: Adult Health

## 2014-09-10 DIAGNOSIS — M5416 Radiculopathy, lumbar region: Secondary | ICD-10-CM | POA: Diagnosis not present

## 2014-09-10 DIAGNOSIS — M961 Postlaminectomy syndrome, not elsewhere classified: Secondary | ICD-10-CM | POA: Diagnosis not present

## 2014-09-10 DIAGNOSIS — M4726 Other spondylosis with radiculopathy, lumbar region: Secondary | ICD-10-CM | POA: Diagnosis not present

## 2014-10-21 DIAGNOSIS — N183 Chronic kidney disease, stage 3 (moderate): Secondary | ICD-10-CM | POA: Diagnosis not present

## 2014-10-21 DIAGNOSIS — F329 Major depressive disorder, single episode, unspecified: Secondary | ICD-10-CM | POA: Diagnosis not present

## 2014-10-21 DIAGNOSIS — E039 Hypothyroidism, unspecified: Secondary | ICD-10-CM | POA: Diagnosis not present

## 2014-10-21 DIAGNOSIS — G894 Chronic pain syndrome: Secondary | ICD-10-CM | POA: Diagnosis not present

## 2014-10-21 DIAGNOSIS — I1310 Hypertensive heart and chronic kidney disease without heart failure, with stage 1 through stage 4 chronic kidney disease, or unspecified chronic kidney disease: Secondary | ICD-10-CM | POA: Diagnosis not present

## 2014-10-21 DIAGNOSIS — K219 Gastro-esophageal reflux disease without esophagitis: Secondary | ICD-10-CM | POA: Diagnosis not present

## 2014-10-21 DIAGNOSIS — G4700 Insomnia, unspecified: Secondary | ICD-10-CM | POA: Diagnosis not present

## 2014-10-21 DIAGNOSIS — I10 Essential (primary) hypertension: Secondary | ICD-10-CM | POA: Diagnosis not present

## 2014-10-21 DIAGNOSIS — F419 Anxiety disorder, unspecified: Secondary | ICD-10-CM | POA: Diagnosis not present

## 2014-12-19 DIAGNOSIS — F419 Anxiety disorder, unspecified: Secondary | ICD-10-CM | POA: Diagnosis not present

## 2014-12-19 DIAGNOSIS — K219 Gastro-esophageal reflux disease without esophagitis: Secondary | ICD-10-CM | POA: Diagnosis not present

## 2014-12-19 DIAGNOSIS — E039 Hypothyroidism, unspecified: Secondary | ICD-10-CM | POA: Diagnosis not present

## 2014-12-19 DIAGNOSIS — G894 Chronic pain syndrome: Secondary | ICD-10-CM | POA: Diagnosis not present

## 2014-12-19 DIAGNOSIS — G4700 Insomnia, unspecified: Secondary | ICD-10-CM | POA: Diagnosis not present

## 2014-12-19 DIAGNOSIS — H918X9 Other specified hearing loss, unspecified ear: Secondary | ICD-10-CM | POA: Diagnosis not present

## 2014-12-19 DIAGNOSIS — I1310 Hypertensive heart and chronic kidney disease without heart failure, with stage 1 through stage 4 chronic kidney disease, or unspecified chronic kidney disease: Secondary | ICD-10-CM | POA: Diagnosis not present

## 2015-01-11 ENCOUNTER — Encounter (HOSPITAL_COMMUNITY): Payer: Self-pay | Admitting: Emergency Medicine

## 2015-01-11 ENCOUNTER — Emergency Department (HOSPITAL_COMMUNITY): Payer: Medicare Other

## 2015-01-11 ENCOUNTER — Emergency Department (HOSPITAL_COMMUNITY)
Admission: EM | Admit: 2015-01-11 | Discharge: 2015-01-11 | Disposition: A | Discharge status: Home / Self Care | Payer: Medicare Other | Attending: Emergency Medicine | Admitting: Emergency Medicine

## 2015-01-11 DIAGNOSIS — R51 Headache: Secondary | ICD-10-CM | POA: Diagnosis not present

## 2015-01-11 DIAGNOSIS — J984 Other disorders of lung: Secondary | ICD-10-CM | POA: Diagnosis not present

## 2015-01-11 DIAGNOSIS — Z853 Personal history of malignant neoplasm of breast: Secondary | ICD-10-CM | POA: Diagnosis not present

## 2015-01-11 DIAGNOSIS — I509 Heart failure, unspecified: Secondary | ICD-10-CM | POA: Insufficient documentation

## 2015-01-11 DIAGNOSIS — N390 Urinary tract infection, site not specified: Secondary | ICD-10-CM | POA: Diagnosis not present

## 2015-01-11 DIAGNOSIS — I10 Essential (primary) hypertension: Secondary | ICD-10-CM | POA: Insufficient documentation

## 2015-01-11 DIAGNOSIS — Z8659 Personal history of other mental and behavioral disorders: Secondary | ICD-10-CM | POA: Diagnosis not present

## 2015-01-11 DIAGNOSIS — J811 Chronic pulmonary edema: Secondary | ICD-10-CM | POA: Diagnosis not present

## 2015-01-11 DIAGNOSIS — R0602 Shortness of breath: Secondary | ICD-10-CM | POA: Diagnosis not present

## 2015-01-11 DIAGNOSIS — K219 Gastro-esophageal reflux disease without esophagitis: Secondary | ICD-10-CM | POA: Insufficient documentation

## 2015-01-11 DIAGNOSIS — Z7982 Long term (current) use of aspirin: Secondary | ICD-10-CM | POA: Diagnosis not present

## 2015-01-11 DIAGNOSIS — R42 Dizziness and giddiness: Secondary | ICD-10-CM | POA: Insufficient documentation

## 2015-01-11 DIAGNOSIS — E039 Hypothyroidism, unspecified: Secondary | ICD-10-CM | POA: Diagnosis not present

## 2015-01-11 DIAGNOSIS — Z8673 Personal history of transient ischemic attack (TIA), and cerebral infarction without residual deficits: Secondary | ICD-10-CM | POA: Diagnosis not present

## 2015-01-11 DIAGNOSIS — Z79899 Other long term (current) drug therapy: Secondary | ICD-10-CM | POA: Insufficient documentation

## 2015-01-11 DIAGNOSIS — R519 Headache, unspecified: Secondary | ICD-10-CM

## 2015-01-11 LAB — URINALYSIS, ROUTINE W REFLEX MICROSCOPIC
Bilirubin Urine: NEGATIVE
Glucose, UA: NEGATIVE mg/dL
Hgb urine dipstick: NEGATIVE
Ketones, ur: NEGATIVE mg/dL
NITRITE: POSITIVE — AB
Protein, ur: NEGATIVE mg/dL
SPECIFIC GRAVITY, URINE: 1.015 (ref 1.005–1.030)
Urobilinogen, UA: 1 mg/dL (ref 0.0–1.0)
pH: 7 (ref 5.0–8.0)

## 2015-01-11 LAB — COMPREHENSIVE METABOLIC PANEL
ALBUMIN: 4 g/dL (ref 3.5–5.0)
ALT: 22 U/L (ref 14–54)
AST: 32 U/L (ref 15–41)
Alkaline Phosphatase: 59 U/L (ref 38–126)
Anion gap: 9 (ref 5–15)
BUN: 18 mg/dL (ref 6–20)
CHLORIDE: 108 mmol/L (ref 101–111)
CO2: 24 mmol/L (ref 22–32)
Calcium: 10.1 mg/dL (ref 8.9–10.3)
Creatinine, Ser: 0.97 mg/dL (ref 0.44–1.00)
GFR calc non Af Amer: 51 mL/min — ABNORMAL LOW (ref >60)
GFR, EST AFRICAN AMERICAN: 59 mL/min — AB (ref >60)
Glucose, Bld: 169 mg/dL — ABNORMAL HIGH (ref 65–99)
Potassium: 4.1 mmol/L (ref 3.5–5.1)
Sodium: 141 mmol/L (ref 135–145)
Total Bilirubin: 0.7 mg/dL (ref 0.3–1.2)
Total Protein: 7.1 g/dL (ref 6.5–8.1)

## 2015-01-11 LAB — URINE MICROSCOPIC-ADD ON

## 2015-01-11 LAB — CBC WITH DIFFERENTIAL/PLATELET
BASOS ABS: 0 10*3/uL (ref 0.0–0.1)
Basophils Relative: 0 % (ref 0–1)
Eosinophils Absolute: 0.1 10*3/uL (ref 0.0–0.7)
Eosinophils Relative: 2 % (ref 0–5)
HCT: 49.4 % — ABNORMAL HIGH (ref 36.0–46.0)
Hemoglobin: 16.2 g/dL — ABNORMAL HIGH (ref 12.0–15.0)
LYMPHS ABS: 2.4 10*3/uL (ref 0.7–4.0)
Lymphocytes Relative: 30 % (ref 12–46)
MCH: 31.5 pg (ref 26.0–34.0)
MCHC: 32.8 g/dL (ref 30.0–36.0)
MCV: 96.1 fL (ref 78.0–100.0)
MONO ABS: 0.8 10*3/uL (ref 0.1–1.0)
Monocytes Relative: 10 % (ref 3–12)
NEUTROS ABS: 4.7 10*3/uL (ref 1.7–7.7)
Neutrophils Relative %: 58 % (ref 43–77)
Platelets: 183 10*3/uL (ref 150–400)
RBC: 5.14 MIL/uL — ABNORMAL HIGH (ref 3.87–5.11)
RDW: 13.4 % (ref 11.5–15.5)
WBC: 8.1 10*3/uL (ref 4.0–10.5)

## 2015-01-11 LAB — BRAIN NATRIURETIC PEPTIDE: B Natriuretic Peptide: 197.6 pg/mL — ABNORMAL HIGH (ref 0.0–100.0)

## 2015-01-11 LAB — TROPONIN I: Troponin I: <0.03 ng/mL (ref <0.031)

## 2015-01-11 MED ORDER — CEPHALEXIN 500 MG PO CAPS: 500.0000 mg | ORAL_CAPSULE | Freq: Three times a day (TID) | ORAL | Status: DC

## 2015-01-11 MED ORDER — DEXTROSE 5 % IV SOLN
1.0000 g | Freq: Once | INTRAVENOUS | Status: AC
  Administered 2015-01-11: 1 g via INTRAVENOUS
  Filled 2015-01-11: qty 10

## 2015-01-11 MED ORDER — DIPHENHYDRAMINE HCL 50 MG/ML IJ SOLN
12.5000 mg | Freq: Once | INTRAMUSCULAR | Status: AC
  Administered 2015-01-11: 12.5 mg via INTRAVENOUS
  Filled 2015-01-11: qty 1

## 2015-01-11 MED ORDER — SODIUM CHLORIDE 0.9 % IV BOLUS (SEPSIS)
500.0000 mL | Freq: Once | INTRAVENOUS | Status: AC
  Administered 2015-01-11: 500 mL via INTRAVENOUS

## 2015-01-11 MED ORDER — DEXAMETHASONE SODIUM PHOSPHATE 10 MG/ML IJ SOLN
10.0000 mg | Freq: Once | INTRAMUSCULAR | Status: AC
  Administered 2015-01-11: 10 mg via INTRAVENOUS
  Filled 2015-01-11: qty 1

## 2015-01-11 MED ORDER — METOCLOPRAMIDE HCL 5 MG/ML IJ SOLN
5.0000 mg | Freq: Once | INTRAMUSCULAR | Status: AC
  Administered 2015-01-11: 5 mg via INTRAVENOUS
  Filled 2015-01-11: qty 2

## 2015-01-11 NOTE — ED Notes (Signed)
Bed: WA20 Expected date:  Expected time:  Means of arrival:  Comments: EMS 
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
## 2015-01-11 NOTE — ED Notes (Signed)
Walked pt around in ED. Pt info me her heart was beating fast as well as her knee started to hurt, but her knee always hurt when she is walking.

## 2015-01-14 LAB — URINE CULTURE
Culture: >=100000
SPECIAL REQUESTS: NORMAL

## 2015-01-16 ENCOUNTER — Telehealth: Payer: Self-pay | Admitting: *Deleted

## 2015-01-16 NOTE — ED Notes (Signed)
(+)  urine culture, treated with Cephalexin, OK per J. Judie Bonus, Pharm

## 2015-01-21 DIAGNOSIS — M5416 Radiculopathy, lumbar region: Secondary | ICD-10-CM | POA: Diagnosis not present

## 2015-01-21 DIAGNOSIS — M961 Postlaminectomy syndrome, not elsewhere classified: Secondary | ICD-10-CM | POA: Diagnosis not present

## 2015-01-21 DIAGNOSIS — M25561 Pain in right knee: Secondary | ICD-10-CM | POA: Diagnosis not present

## 2015-01-21 DIAGNOSIS — M4726 Other spondylosis with radiculopathy, lumbar region: Secondary | ICD-10-CM | POA: Diagnosis not present

## 2015-01-30 DIAGNOSIS — H918X9 Other specified hearing loss, unspecified ear: Secondary | ICD-10-CM | POA: Diagnosis not present

## 2015-01-30 DIAGNOSIS — F419 Anxiety disorder, unspecified: Secondary | ICD-10-CM | POA: Diagnosis not present

## 2015-01-30 DIAGNOSIS — K219 Gastro-esophageal reflux disease without esophagitis: Secondary | ICD-10-CM | POA: Diagnosis not present

## 2015-01-30 DIAGNOSIS — I1310 Hypertensive heart and chronic kidney disease without heart failure, with stage 1 through stage 4 chronic kidney disease, or unspecified chronic kidney disease: Secondary | ICD-10-CM | POA: Diagnosis not present

## 2015-01-30 DIAGNOSIS — G4700 Insomnia, unspecified: Secondary | ICD-10-CM | POA: Diagnosis not present

## 2015-01-30 DIAGNOSIS — E039 Hypothyroidism, unspecified: Secondary | ICD-10-CM | POA: Diagnosis not present

## 2015-01-30 DIAGNOSIS — G894 Chronic pain syndrome: Secondary | ICD-10-CM | POA: Diagnosis not present

## 2015-02-02 DIAGNOSIS — M5416 Radiculopathy, lumbar region: Secondary | ICD-10-CM | POA: Diagnosis not present

## 2015-02-02 DIAGNOSIS — M961 Postlaminectomy syndrome, not elsewhere classified: Secondary | ICD-10-CM | POA: Diagnosis not present

## 2015-02-13 DIAGNOSIS — Z23 Encounter for immunization: Secondary | ICD-10-CM | POA: Diagnosis not present

## 2015-03-03 DIAGNOSIS — M542 Cervicalgia: Secondary | ICD-10-CM | POA: Diagnosis not present

## 2015-03-03 DIAGNOSIS — M6281 Muscle weakness (generalized): Secondary | ICD-10-CM | POA: Diagnosis not present

## 2015-03-04 DIAGNOSIS — M542 Cervicalgia: Secondary | ICD-10-CM | POA: Diagnosis not present

## 2015-03-04 DIAGNOSIS — M6281 Muscle weakness (generalized): Secondary | ICD-10-CM | POA: Diagnosis not present

## 2015-03-05 DIAGNOSIS — M542 Cervicalgia: Secondary | ICD-10-CM | POA: Diagnosis not present

## 2015-03-05 DIAGNOSIS — M6281 Muscle weakness (generalized): Secondary | ICD-10-CM | POA: Diagnosis not present

## 2015-03-10 DIAGNOSIS — M6281 Muscle weakness (generalized): Secondary | ICD-10-CM | POA: Diagnosis not present

## 2015-03-10 DIAGNOSIS — M542 Cervicalgia: Secondary | ICD-10-CM | POA: Diagnosis not present

## 2015-03-11 DIAGNOSIS — M6281 Muscle weakness (generalized): Secondary | ICD-10-CM | POA: Diagnosis not present

## 2015-03-11 DIAGNOSIS — M542 Cervicalgia: Secondary | ICD-10-CM | POA: Diagnosis not present

## 2015-03-12 DIAGNOSIS — M542 Cervicalgia: Secondary | ICD-10-CM | POA: Diagnosis not present

## 2015-03-12 DIAGNOSIS — M6281 Muscle weakness (generalized): Secondary | ICD-10-CM | POA: Diagnosis not present

## 2015-03-16 DIAGNOSIS — M542 Cervicalgia: Secondary | ICD-10-CM | POA: Diagnosis not present

## 2015-03-16 DIAGNOSIS — M6281 Muscle weakness (generalized): Secondary | ICD-10-CM | POA: Diagnosis not present

## 2015-03-17 DIAGNOSIS — M6281 Muscle weakness (generalized): Secondary | ICD-10-CM | POA: Diagnosis not present

## 2015-03-17 DIAGNOSIS — M542 Cervicalgia: Secondary | ICD-10-CM | POA: Diagnosis not present

## 2015-03-18 DIAGNOSIS — M542 Cervicalgia: Secondary | ICD-10-CM | POA: Diagnosis not present

## 2015-03-19 DIAGNOSIS — M6281 Muscle weakness (generalized): Secondary | ICD-10-CM | POA: Diagnosis not present

## 2015-03-19 DIAGNOSIS — M542 Cervicalgia: Secondary | ICD-10-CM | POA: Diagnosis not present

## 2015-03-23 DIAGNOSIS — M542 Cervicalgia: Secondary | ICD-10-CM | POA: Diagnosis not present

## 2015-03-23 DIAGNOSIS — M6281 Muscle weakness (generalized): Secondary | ICD-10-CM | POA: Diagnosis not present

## 2015-03-25 DIAGNOSIS — M542 Cervicalgia: Secondary | ICD-10-CM | POA: Diagnosis not present

## 2015-03-25 DIAGNOSIS — M6281 Muscle weakness (generalized): Secondary | ICD-10-CM | POA: Diagnosis not present

## 2015-03-26 DIAGNOSIS — M542 Cervicalgia: Secondary | ICD-10-CM | POA: Diagnosis not present

## 2015-03-26 DIAGNOSIS — M6281 Muscle weakness (generalized): Secondary | ICD-10-CM | POA: Diagnosis not present

## 2015-03-31 DIAGNOSIS — M6281 Muscle weakness (generalized): Secondary | ICD-10-CM | POA: Diagnosis not present

## 2015-03-31 DIAGNOSIS — M542 Cervicalgia: Secondary | ICD-10-CM | POA: Diagnosis not present

## 2015-04-01 DIAGNOSIS — M6281 Muscle weakness (generalized): Secondary | ICD-10-CM | POA: Diagnosis not present

## 2015-04-01 DIAGNOSIS — M542 Cervicalgia: Secondary | ICD-10-CM | POA: Diagnosis not present

## 2015-04-02 DIAGNOSIS — M6281 Muscle weakness (generalized): Secondary | ICD-10-CM | POA: Diagnosis not present

## 2015-04-02 DIAGNOSIS — M542 Cervicalgia: Secondary | ICD-10-CM | POA: Diagnosis not present

## 2015-04-06 DIAGNOSIS — M6281 Muscle weakness (generalized): Secondary | ICD-10-CM | POA: Diagnosis not present

## 2015-04-06 DIAGNOSIS — M542 Cervicalgia: Secondary | ICD-10-CM | POA: Diagnosis not present

## 2015-04-09 DIAGNOSIS — M542 Cervicalgia: Secondary | ICD-10-CM | POA: Diagnosis not present

## 2015-04-13 ENCOUNTER — Other Ambulatory Visit: Payer: Self-pay | Admitting: Orthopedic Surgery

## 2015-04-13 DIAGNOSIS — M542 Cervicalgia: Secondary | ICD-10-CM

## 2015-04-14 ENCOUNTER — Ambulatory Visit
Admission: RE | Admit: 2015-04-14 | Discharge: 2015-04-14 | Disposition: A | Discharge status: Home / Self Care | Payer: Medicare Other | Source: Ambulatory Visit | Attending: Orthopedic Surgery | Admitting: Orthopedic Surgery

## 2015-04-14 DIAGNOSIS — M542 Cervicalgia: Secondary | ICD-10-CM

## 2015-04-14 DIAGNOSIS — M47812 Spondylosis without myelopathy or radiculopathy, cervical region: Secondary | ICD-10-CM | POA: Diagnosis not present

## 2015-04-19 ENCOUNTER — Other Ambulatory Visit: Payer: Medicare Other

## 2015-04-22 DIAGNOSIS — M542 Cervicalgia: Secondary | ICD-10-CM | POA: Diagnosis not present

## 2015-04-27 ENCOUNTER — Other Ambulatory Visit: Payer: Medicare Other

## 2015-04-29 DIAGNOSIS — M47812 Spondylosis without myelopathy or radiculopathy, cervical region: Secondary | ICD-10-CM | POA: Diagnosis not present

## 2015-04-29 DIAGNOSIS — M542 Cervicalgia: Secondary | ICD-10-CM | POA: Diagnosis not present

## 2015-05-04 DIAGNOSIS — M542 Cervicalgia: Secondary | ICD-10-CM | POA: Diagnosis not present

## 2015-05-04 DIAGNOSIS — M47812 Spondylosis without myelopathy or radiculopathy, cervical region: Secondary | ICD-10-CM | POA: Diagnosis not present

## 2015-05-19 ENCOUNTER — Other Ambulatory Visit: Payer: Self-pay | Admitting: Internal Medicine

## 2015-05-19 ENCOUNTER — Ambulatory Visit
Admission: RE | Admit: 2015-05-19 | Discharge: 2015-05-19 | Disposition: A | Discharge status: Home / Self Care | Payer: Medicare Other | Source: Ambulatory Visit | Attending: Internal Medicine | Admitting: Internal Medicine

## 2015-05-19 DIAGNOSIS — M542 Cervicalgia: Secondary | ICD-10-CM

## 2015-05-19 DIAGNOSIS — S0093XA Contusion of unspecified part of head, initial encounter: Secondary | ICD-10-CM

## 2015-05-19 DIAGNOSIS — R51 Headache: Principal | ICD-10-CM

## 2015-05-19 DIAGNOSIS — S0990XA Unspecified injury of head, initial encounter: Secondary | ICD-10-CM | POA: Diagnosis not present

## 2015-05-19 DIAGNOSIS — R519 Headache, unspecified: Secondary | ICD-10-CM

## 2015-06-10 DIAGNOSIS — E039 Hypothyroidism, unspecified: Secondary | ICD-10-CM | POA: Diagnosis not present

## 2015-06-10 DIAGNOSIS — K921 Melena: Secondary | ICD-10-CM | POA: Diagnosis not present

## 2015-06-10 DIAGNOSIS — M542 Cervicalgia: Secondary | ICD-10-CM | POA: Diagnosis not present

## 2015-06-10 DIAGNOSIS — F419 Anxiety disorder, unspecified: Secondary | ICD-10-CM | POA: Diagnosis not present

## 2015-06-24 DIAGNOSIS — E039 Hypothyroidism, unspecified: Secondary | ICD-10-CM | POA: Diagnosis not present

## 2015-07-13 DIAGNOSIS — G479 Sleep disorder, unspecified: Secondary | ICD-10-CM | POA: Diagnosis not present

## 2015-07-13 DIAGNOSIS — F419 Anxiety disorder, unspecified: Secondary | ICD-10-CM | POA: Diagnosis not present

## 2015-07-13 DIAGNOSIS — K219 Gastro-esophageal reflux disease without esophagitis: Secondary | ICD-10-CM | POA: Diagnosis not present

## 2015-07-13 DIAGNOSIS — E039 Hypothyroidism, unspecified: Secondary | ICD-10-CM | POA: Diagnosis not present

## 2015-07-27 DIAGNOSIS — R413 Other amnesia: Secondary | ICD-10-CM | POA: Diagnosis not present

## 2015-07-27 DIAGNOSIS — Z79899 Other long term (current) drug therapy: Secondary | ICD-10-CM | POA: Diagnosis not present

## 2015-07-27 DIAGNOSIS — E039 Hypothyroidism, unspecified: Secondary | ICD-10-CM | POA: Diagnosis not present

## 2015-07-27 DIAGNOSIS — M542 Cervicalgia: Secondary | ICD-10-CM | POA: Diagnosis not present

## 2015-08-19 DIAGNOSIS — E039 Hypothyroidism, unspecified: Secondary | ICD-10-CM | POA: Diagnosis not present

## 2015-08-26 DIAGNOSIS — M542 Cervicalgia: Secondary | ICD-10-CM | POA: Diagnosis not present

## 2015-09-02 DIAGNOSIS — M47812 Spondylosis without myelopathy or radiculopathy, cervical region: Secondary | ICD-10-CM | POA: Diagnosis not present

## 2015-09-02 DIAGNOSIS — M542 Cervicalgia: Secondary | ICD-10-CM | POA: Diagnosis not present

## 2015-09-23 DIAGNOSIS — M542 Cervicalgia: Secondary | ICD-10-CM | POA: Diagnosis not present

## 2015-09-23 DIAGNOSIS — M791 Myalgia: Secondary | ICD-10-CM | POA: Diagnosis not present

## 2015-10-02 ENCOUNTER — Encounter: Payer: Self-pay | Admitting: Gastroenterology

## 2015-10-02 DIAGNOSIS — K921 Melena: Secondary | ICD-10-CM | POA: Diagnosis not present

## 2015-10-02 DIAGNOSIS — K5900 Constipation, unspecified: Secondary | ICD-10-CM | POA: Diagnosis not present

## 2015-10-26 DIAGNOSIS — R413 Other amnesia: Secondary | ICD-10-CM | POA: Diagnosis not present

## 2015-10-26 DIAGNOSIS — F419 Anxiety disorder, unspecified: Secondary | ICD-10-CM | POA: Diagnosis not present

## 2015-10-26 DIAGNOSIS — E039 Hypothyroidism, unspecified: Secondary | ICD-10-CM | POA: Diagnosis not present

## 2015-10-26 DIAGNOSIS — G479 Sleep disorder, unspecified: Secondary | ICD-10-CM | POA: Diagnosis not present

## 2015-10-26 DIAGNOSIS — K219 Gastro-esophageal reflux disease without esophagitis: Secondary | ICD-10-CM | POA: Diagnosis not present

## 2015-10-26 DIAGNOSIS — M542 Cervicalgia: Secondary | ICD-10-CM | POA: Diagnosis not present

## 2015-10-26 DIAGNOSIS — Z79899 Other long term (current) drug therapy: Secondary | ICD-10-CM | POA: Diagnosis not present

## 2015-11-26 ENCOUNTER — Other Ambulatory Visit (INDEPENDENT_AMBULATORY_CARE_PROVIDER_SITE_OTHER): Payer: Medicare Other

## 2015-11-26 ENCOUNTER — Ambulatory Visit (INDEPENDENT_AMBULATORY_CARE_PROVIDER_SITE_OTHER): Payer: Medicare Other | Admitting: Gastroenterology

## 2015-11-26 ENCOUNTER — Encounter: Payer: Self-pay | Admitting: Gastroenterology

## 2015-11-26 VITALS — BP 140/70 | HR 60 | Ht 66.0 in | Wt 171.4 lb

## 2015-11-26 DIAGNOSIS — K625 Hemorrhage of anus and rectum: Secondary | ICD-10-CM

## 2015-11-26 DIAGNOSIS — R1032 Left lower quadrant pain: Secondary | ICD-10-CM

## 2015-11-26 LAB — CBC WITH DIFFERENTIAL/PLATELET
BASOS ABS: 0.1 10*3/uL (ref 0.0–0.1)
Basophils Relative: 1.1 % (ref 0.0–3.0)
Eosinophils Absolute: 0.4 10*3/uL (ref 0.0–0.7)
Eosinophils Relative: 4.4 % (ref 0.0–5.0)
HEMATOCRIT: 45.7 % (ref 36.0–46.0)
Hemoglobin: 14.9 g/dL (ref 12.0–15.0)
LYMPHS ABS: 2.7 10*3/uL (ref 0.7–4.0)
LYMPHS PCT: 27.9 % (ref 12.0–46.0)
MCHC: 32.7 g/dL (ref 30.0–36.0)
MCV: 88.9 fl (ref 78.0–100.0)
MONOS PCT: 8.9 % (ref 3.0–12.0)
Monocytes Absolute: 0.8 10*3/uL (ref 0.1–1.0)
NEUTROS PCT: 57.7 % (ref 43.0–77.0)
Neutro Abs: 5.5 10*3/uL (ref 1.4–7.7)
Platelets: 227 10*3/uL (ref 150.0–400.0)
RBC: 5.13 Mil/uL — AB (ref 3.87–5.11)
RDW: 15.9 % — ABNORMAL HIGH (ref 11.5–15.5)
WBC: 9.5 10*3/uL (ref 4.0–10.5)

## 2015-11-26 NOTE — Patient Instructions (Addendum)
If you are age 80 or older, your body mass index should be between 23-30. Your Body mass index is 27.68 kg/(m^2). If this is out of the aforementioned range listed, please consider follow up with your Primary Care Provider.  If you are age 80 or younger, your body mass index should be between 19-25. Your Body mass index is 27.68 kg/(m^2). If this is out of the aformentioned range listed, please consider follow up with your Primary Care Provider.   Your physician has requested that you go to the basement for the following lab work before leaving today: [place] have been scheduled for a colonoscopy. Please follow written instructions given to you at your visit today.  Please pick up your prep supplies at the pharmacy within the next 1-3 days. If you use inhalers (even only as needed), please bring them with you on the day of your procedure. Your physician has requested that you go to www.startemmi.com and enter the access code given to you at your visit today. This web site gives a general overview about your procedure. However, you should still follow specific instructions given to you by our office regarding your preparation for the procedure.  Thank you for choosing [facility]  Dr Wilfrid Lund III
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
## 2015-12-02 ENCOUNTER — Encounter: Payer: Self-pay | Admitting: Gastroenterology

## 2015-12-02 ENCOUNTER — Ambulatory Visit (AMBULATORY_SURGERY_CENTER): Payer: Medicare Other | Admitting: Gastroenterology

## 2015-12-02 VITALS — BP 166/82 | HR 72 | Temp 98.6°F | Resp 14 | Ht 66.0 in | Wt 171.0 lb

## 2015-12-02 DIAGNOSIS — K6289 Other specified diseases of anus and rectum: Secondary | ICD-10-CM

## 2015-12-02 DIAGNOSIS — K625 Hemorrhage of anus and rectum: Secondary | ICD-10-CM | POA: Diagnosis not present

## 2015-12-02 DIAGNOSIS — I509 Heart failure, unspecified: Secondary | ICD-10-CM | POA: Diagnosis not present

## 2015-12-02 DIAGNOSIS — C20 Malignant neoplasm of rectum: Secondary | ICD-10-CM

## 2015-12-02 DIAGNOSIS — I739 Peripheral vascular disease, unspecified: Secondary | ICD-10-CM | POA: Diagnosis not present

## 2015-12-02 DIAGNOSIS — C189 Malignant neoplasm of colon, unspecified: Secondary | ICD-10-CM

## 2015-12-02 DIAGNOSIS — K5790 Diverticulosis of intestine, part unspecified, without perforation or abscess without bleeding: Secondary | ICD-10-CM

## 2015-12-02 HISTORY — DX: Diverticulosis of intestine, part unspecified, without perforation or abscess without bleeding: K57.90

## 2015-12-02 HISTORY — DX: Malignant neoplasm of colon, unspecified: C18.9

## 2015-12-02 MED ORDER — SODIUM CHLORIDE 0.9 % IV SOLN: 500.0000 mL | INTRAVENOUS | Status: DC

## 2015-12-02 NOTE — Patient Instructions (Signed)
Discharge instructions given. Biopsies taken. See procedure report concerning subsequent plan. Resume previous medications. YOU HAD AN ENDOSCOPIC PROCEDURE TODAY AT [facility]:   Refer to the procedure report that was given to you for any specific questions about what was found during the examination.  If the procedure report does not answer your questions, please call your gastroenterologist to clarify.  If you requested that your care partner not be given the details of your procedure findings, then the procedure report has been included in a sealed envelope for you to review at your convenience later.  YOU SHOULD EXPECT: Some feelings of bloating in the abdomen. Passage of more gas than usual.  Walking can help get rid of the air that was put into your GI tract during the procedure and reduce the bloating. If you had a lower endoscopy (such as a colonoscopy or flexible sigmoidoscopy) you may notice spotting of blood in your stool or on the toilet paper. If you underwent a bowel prep for your procedure, you may not have a normal bowel movement for a few days.  Please Note:  You might notice some irritation and congestion in your nose or some drainage.  This is from the oxygen used during your procedure.  There is no need for concern and it should clear up in a day or so.  SYMPTOMS TO REPORT IMMEDIATELY:   Following lower endoscopy (colonoscopy or flexible sigmoidoscopy):  Excessive amounts of blood in the stool  Significant tenderness or worsening of abdominal pains  Swelling of the abdomen that is new, acute  Fever of 100F or higher   For urgent or emergent issues, a gastroenterologist can be reached at any hour by calling [phone].   DIET: Your first meal following the procedure should be a small meal and then it is ok to progress to your normal diet. Heavy or fried foods are harder to digest and may make you feel nauseous or bloated.  Likewise, meals heavy in  dairy and vegetables can increase bloating.  Drink plenty of fluids but you should avoid alcoholic beverages for 24 hours.  ACTIVITY:  You should plan to take it easy for the rest of today and you should NOT DRIVE or use heavy machinery until tomorrow (because of the sedation medicines used during the test).    FOLLOW UP: Our staff will call the number listed on your records the next business day following your procedure to check on you and address any questions or concerns that you may have regarding the information given to you following your procedure. If we do not reach you, we will leave a message.  However, if you are feeling well and you are not experiencing any problems, there is no need to return our call.  We will assume that you have returned to your regular daily activities without incident.  If any biopsies were taken you will be contacted by phone or by letter within the next 1-3 weeks.  Please call us at [phone] if you have not heard about the biopsies in 3 weeks.    SIGNATURES/CONFIDENTIALITY: You and/or your care partner have signed paperwork which will be entered into your electronic medical record.  These signatures attest to the fact that that the information above on your After Visit Summary has been reviewed and is understood.  Full responsibility of the confidentiality of this discharge information lies with you and/or your care-partner.

## 2015-12-02 NOTE — Progress Notes (Deleted)
Per patient, no changes in medical, surgeriesl/procedures  family, and/or social history since appointment with previsit nurse.

## 2015-12-02 NOTE — Progress Notes (Signed)
A/ox3, pleased with MAC, report to RN 

## 2015-12-02 NOTE — Progress Notes (Signed)
Called to room to assist during endoscopic procedure.  Patient ID and intended procedure confirmed with present staff. Received instructions for my participation in the procedure from the performing physician.  

## 2015-12-02 NOTE — Op Note (Signed)
[facility] Patient Name: [name] Procedure Date: 12/02/2015 11:15 AM MRN: [id number] Endoscopist: Mallie Mussel L. Loletha Carrow , MD Age: 82 Referring MD:  Date of Birth: 01/04/1925 Gender: Female Procedure:                Colonoscopy Indications:              Lower abdominal pain, Rectal bleeding Medicines:                Monitored Anesthesia Care Procedure:                Pre-Anesthesia Assessment:                           - Prior to the procedure, a History and Physical                            was performed, and patient medications and                            allergies were reviewed. [name] tolerance of                            previous anesthesia was also reviewed. The risks                            and benefits of the procedure and the sedation                            options and risks were discussed with the patient.                            All questions were answered, and informed consent                            was obtained. Prior Anticoagulants: The patient has                            taken no previous anticoagulant or antiplatelet                            agents. ASA Grade Assessment: II - A patient with                            mild systemic disease. After reviewing the risks                            and benefits, the patient was deemed in                            satisfactory condition to undergo the procedure.                           After obtaining informed consent, the colonoscope  was passed under direct vision. Throughout the                            procedure, the patient's blood pressure, pulse, and                            oxygen saturations were monitored continuously. The                            Model CF-HQ190L [id number]) scope was introduced                            through the anus and advanced to the the cecum,                            identified by appendiceal orifice and  ileocecal                            valve. The colonoscopy was performed without                            difficulty. The patient tolerated the procedure                            well. The quality of the bowel preparation was                            good. The ileocecal valve, appendiceal orifice, and                            rectum were photographed. The bowel preparation                            used was Miralax. Scope In: 11:44:40 AM Scope Out: 12:02:57 PM Scope Withdrawal Time: 0 hours 12 minutes 15 seconds  Total Procedure Duration: 0 hours 18 minutes 17 seconds  Findings:                 The digital rectal exam findings include decreased                            sphincter tone.                           Multiple small-mouthed diverticula were found in                            the left colon.                           A fungating partially obstructing medium-sized mass                            was found in the proximal rectum, between 10-16 cc  from the anal verge. The mass was partially                            circumferential (involving one-half of the lumen                            circumference). The mass measured six cm in length.                            No bleeding was present. Biopsies were taken with a                            cold forceps for histology. Area was tattooed with                            an injection of 8 mL of Spot (carbon black) - 3cc                            on proximal aspect and 5 cc distal.                           The exam was otherwise without abnormality on                            direct and retroflexion views. Complications:            No immediate complications. Estimated Blood Loss:     Estimated blood loss was minimal. Impression:               - Decreased sphincter tone found on digital rectal                            exam.                           - Diverticulosis in the left  colon.                           - Partially obstructing tumor in the proximal                            rectum. Biopsied. Tattooed.                           - The examination was otherwise normal on direct                            and retroflexion views. Recommendation:           - Patient has a contact number available for                            emergencies. The signs and symptoms of potential  delayed complications were discussed with the                            patient. Return to normal activities tomorrow.                            Written discharge instructions were provided to the                            patient.                           - Resume previous diet.                           - Await pathology results.                           - Miralax 1 capful in liquid once daily.                           - No recommendation at this time regarding repeat                            colonoscopy due to pending path results and                            subsequent plan.                           - Continue present medications. Chrisotpher Rivero L. Loletha Carrow, MD 12/02/2015 12:11:42 PM This report has been signed electronically.

## 2015-12-03 ENCOUNTER — Telehealth: Payer: Self-pay

## 2015-12-03 NOTE — Telephone Encounter (Signed)
  Follow up Call-  Call back number 12/02/2015  Post procedure Call Back phone  # [phone]  Permission to leave phone message Yes     Patient questions:  Do you have a fever, pain , or abdominal swelling? No. Pain Score  0 *  Have you tolerated food without any problems? Yes.    Have you been able to return to your normal activities? Yes.    Do you have any questions about your discharge instructions: Diet   No. Medications  No. Follow up visit  No.  Do you have questions or concerns about your Care? No.  Actions: * If pain score is 4 or above: No action needed, pain <4.

## 2015-12-04 ENCOUNTER — Encounter: Payer: Self-pay | Admitting: Gastroenterology

## 2015-12-07 ENCOUNTER — Telehealth: Payer: Self-pay

## 2015-12-07 DIAGNOSIS — C20 Malignant neoplasm of rectum: Secondary | ICD-10-CM

## 2015-12-07 NOTE — Telephone Encounter (Signed)
Daughter notified of the results and plans. She is aware that she will be contacted directly by Oncology for an appt.

## 2015-12-07 NOTE — Telephone Encounter (Signed)
-----   Message from [place], MD sent at 12/06/2015  9:42 AM EDT ----- Chong Sicilian,    I discovered a rectal mass on this patient on 5/31, and pathology should return early this week.  Since I will be out of town, her daughter needs to be contacted with the results while I am away. Both patient and daughter are aware that it is malignant-appearing, but that we must await the final pathology report. They also both asked that the daughter be called with the results, since [place] is sometimes forgetful. If you feel comfortable doing this, please call the daughter Dorothe Pea at [phone] with the report (after confirming that it is her and that Arria Polsky is her mother).  If malignant, she needs urgent referral to the [facility] coordinator for oncology consult and probable presentation at tumor board. If you do not feel comfortable delivering this news, please ask for assistance from Doc of the day. Thanks  - HD

## 2015-12-08 ENCOUNTER — Telehealth: Payer: Self-pay | Admitting: *Deleted

## 2015-12-08 ENCOUNTER — Other Ambulatory Visit: Payer: Self-pay

## 2015-12-08 DIAGNOSIS — C20 Malignant neoplasm of rectum: Secondary | ICD-10-CM

## 2015-12-08 NOTE — Telephone Encounter (Signed)
Pt has been notified of the CT scan and labs needed, the daughter will pick up contrast this week as well as bring the pt in for labs.  She was instructed on the CT scan and given directions to [facility]

## 2015-12-08 NOTE — Telephone Encounter (Signed)
Oncology Nurse Navigator Documentation  Oncology Nurse Navigator Flowsheets 12/08/2015  Navigator Location [facility]  Navigator Encounter Type Introductory phone call  Abnormal Finding Date 12/02/2015  Confirmed Diagnosis Date 12/02/2015   Spoke with daughter and provided new patient appointment for 12/15/15 at 2 pm with Dr. Benay Spice (declined the [facility] appt. On 6/16-needs afternoons). Informed of location of [place], valet service, and registration process. Reminded to bring insurance cards and a current medication list, including supplements. Daughter verbalizes understanding.

## 2015-12-08 NOTE — Telephone Encounter (Signed)
You have been scheduled for a CT scan of the abdomen and pelvis at [facility] ([address]---this is in the same building as [facility]).   You are scheduled on 12/14/15 at 3 pm. You should arrive 15 minutes prior to your appointment time for registration. Please follow the written instructions below on the day of your exam:  WARNING: IF YOU ARE ALLERGIC TO IODINE/X-RAY DYE, PLEASE NOTIFY RADIOLOGY IMMEDIATELY AT [phone]! YOU WILL BE GIVEN A 13 HOUR PREMEDICATION PREP.  1) Do not eat or drink anything after 11 am (4 hours prior to your test) 2) You have been given 2 bottles of oral contrast to drink. The solution may taste better if refrigerated, but do NOT add ice or any other liquid to this solution. Shake well before drinking.    Drink 1 bottle of contrast @ 1 pm (2 hours prior to your exam)  Drink 1 bottle of contrast @ 2 pm (1 hour prior to your exam)  You may take any medications as prescribed with a small amount of water except for the following: Metformin, Glucophage, Glucovance, Avandamet, Riomet, Fortamet, Actoplus Met, Janumet, Glumetza or Metaglip. The above medications must be held the day of the exam AND 48 hours after the exam.  The purpose of you drinking the oral contrast is to aid in the visualization of your intestinal tract. The contrast solution may cause some diarrhea. Before your exam is started, you will be given a small amount of fluid to drink. Depending on your individual set of symptoms, you may also receive an intravenous injection of x-ray contrast/dye. Plan on being at [facility] for 30 minutes or longer, depending on the type of exam you are having performed.  This test typically takes 30-45 minutes to complete.  If you have any questions regarding your exam or if you need to reschedule, you may call the CT department at [phone] between the hours of 8:00 am and 5:00 pm,  Monday-Friday.  ________________________________________________________________________

## 2015-12-08 NOTE — Telephone Encounter (Signed)
Marlon Pel, RN  Barron Alvine, RN            Previous Messages     ----- Message -----   From: [name], MD   Sent: 12/08/2015  7:11 AM    To: Marlon Pel, RN   Sheri,  She needs CT scan chest, abd, pelvis; CEA level (staging for newly diagnosed rectal adenocarcinoma. Pending those results she may need EUS. Thanks   dj   ----- Message -----   From: Marlon Pel, RN   Sent: 12/07/2015  4:19 PM    To: [name], MD   Dr. Ardis Hughs,   Can you please review for EUS in this patient? She is a Dr. Loletha Carrow patient with new rectal adenocarcinoma He is out of the office can I set up the CTs? Chest , abdomen, pelvis?   Sheri  ----- Message -----   From: Tania Ade, RN   Sent: 12/07/2015  3:57 PM    To: Marlon Pel, RN   Sheri,  Dr. Benay Spice will see her in clinic along with Dr. Lisbeth Renshaw from [facility] on 12/18/15. I'll call patient. Dr. Benay Spice is asking for Dr. Ardis Hughs to do an EUS on her before then as well as get CT C/A/P ordered.  Can this be done?  Her tumor is proximal rectal, so I may be able to get Dr. Barry Dienes to see her the same day..so hold off on a surgical referral right now.  Manuela Schwartz  ----- Message -----   From: Marlon Pel, RN   Sent: 12/07/2015 11:15 AM    To: Tania Ade, RN   Good morning,   Dr. Loletha Carrow has a patient with an invasive adenocarcinoma from rectum. I am putting in an urgent referral to oncology and he would like this to be reviewed for probable presentation at the tumor board.    Her daughter will need to be the point of contact [facility] [phone].   Thanks   [employer]

## 2015-12-09 ENCOUNTER — Telehealth: Payer: Self-pay | Admitting: Radiation Oncology

## 2015-12-09 NOTE — Telephone Encounter (Signed)
Left message for daughter or mother to return my call to set consult appointment up, as of now it is set for 6/14 @ 3:30 with Dr Lisbeth Renshaw

## 2015-12-10 ENCOUNTER — Encounter: Payer: Self-pay | Admitting: Radiation Oncology

## 2015-12-10 NOTE — Progress Notes (Signed)
GI Location of Tumor / Histology:  Proximal rectum (fungating partial obstruction  Medium sized mass=6cm length ) Rectal Bleeding  [name] presented  months ago with symptoms of: dark or bright red blood  Per rectum and lower abdominal pain   Biopsies of  (if applicable) revealed:Diagnosis 12/02/15: Surgical [P], rectum, polyp- INVASIVE ADENOCARCINOMA.  Past/Anticipated interventions by surgeon, if any: Dr. Wilfrid Lund III,MD  Performed colonoscopy done and Biopsys on   12/02/15    Past/Anticipated interventions by medical oncology, if any: Ct abd/pelvis 12/14/15, Appt Dr. Benay Spice 12/15/15, f/u 12/21/15  Weight changes, if [id number]  Bowel/Bladder complaints, if any: good bowel movements now after colonoscopy, has blood on tissue at times after bowels,  Nocturia 2-3 x night , frequency during the day  Nausea / Vomiting, if any:   Pain issues, if any: left lower abdomen, no nausea   Any blood per rectum:   Yes on tissues mostly now,   SAFETY ISSUES: YES  Prior radiation?NO  Pacemaker/ICD?NOPossible current pregnancy? N/A  Is the patient on methotrexate? NO   Current Complaints/Details: Widowed,  2 children, Hx Breast Ca/Mastectomy /Anxiety CHF,Insomnia, non smoker, no alcohol or illicit  drug use No known colorectal cancer  Allergies: Codeine=itching,Pregabalin=nervousness.k BP 159/73 mmHg  Pulse 68  Temp(Src) 98.6 F (37 C) (Oral)  Resp 16  Ht 5\' 6"  (1.676 m)  Wt 169 lb 1.6 oz (76.703 kg)  BMI 27.31 kg/m2  SpO2 100%  Wt Readings from Last 3 Encounters:  12/16/15 169 lb 1.6 oz (76.703 kg)  12/02/15 171 lb (77.565 kg)  11/26/15 171 lb 6.4 oz (77.747 kg)

## 2015-12-11 ENCOUNTER — Encounter: Payer: Self-pay | Admitting: Radiation Oncology

## 2015-12-11 ENCOUNTER — Other Ambulatory Visit (INDEPENDENT_AMBULATORY_CARE_PROVIDER_SITE_OTHER): Payer: Medicare Other

## 2015-12-11 ENCOUNTER — Other Ambulatory Visit: Payer: Medicare Other

## 2015-12-11 DIAGNOSIS — C20 Malignant neoplasm of rectum: Secondary | ICD-10-CM

## 2015-12-11 LAB — CREATININE, SERUM: Creatinine, Ser: 0.98 mg/dL (ref 0.40–1.20)

## 2015-12-11 LAB — BUN: BUN: 19 mg/dL (ref 6–23)

## 2015-12-12 LAB — CEA: CEA: 1 ng/mL

## 2015-12-14 ENCOUNTER — Ambulatory Visit (INDEPENDENT_AMBULATORY_CARE_PROVIDER_SITE_OTHER)
Admission: RE | Admit: 2015-12-14 | Discharge: 2015-12-14 | Disposition: A | Discharge status: Home / Self Care | Payer: Medicare Other | Source: Ambulatory Visit | Attending: Gastroenterology | Admitting: Gastroenterology

## 2015-12-14 DIAGNOSIS — C20 Malignant neoplasm of rectum: Secondary | ICD-10-CM

## 2015-12-14 MED ORDER — IOPAMIDOL (ISOVUE-300) INJECTION 61%
100.0000 mL | Freq: Once | INTRAVENOUS | Status: AC | PRN
  Administered 2015-12-14: 100 mL via INTRAVENOUS

## 2015-12-15 ENCOUNTER — Other Ambulatory Visit: Payer: Self-pay

## 2015-12-15 ENCOUNTER — Ambulatory Visit: Payer: Medicare Other | Admitting: Oncology

## 2015-12-15 ENCOUNTER — Telehealth: Payer: Self-pay | Admitting: *Deleted

## 2015-12-15 DIAGNOSIS — C20 Malignant neoplasm of rectum: Secondary | ICD-10-CM

## 2015-12-15 NOTE — Telephone Encounter (Signed)
Pt missed appt today, called to follow up: she was unaware of appt. Pt requested I contact her daughter, Ivin Booty. Ivin Booty stated she was told to cancel today and that pt would see both doctors on 6/14. Will reschedule visit and make nurse navigator aware. Mayer Camel with appt options, she requested Mon @ 1230.

## 2015-12-16 ENCOUNTER — Ambulatory Visit
Admission: RE | Admit: 2015-12-16 | Discharge: 2015-12-16 | Disposition: A | Discharge status: Home / Self Care | Payer: Medicare Other | Source: Ambulatory Visit | Attending: Radiation Oncology | Admitting: Radiation Oncology

## 2015-12-16 ENCOUNTER — Telehealth: Payer: Self-pay | Admitting: Gastroenterology

## 2015-12-16 ENCOUNTER — Encounter: Payer: Self-pay | Admitting: Radiation Oncology

## 2015-12-16 VITALS — BP 159/73 | HR 68 | Temp 98.6°F | Resp 16 | Ht 66.0 in | Wt 169.1 lb

## 2015-12-16 DIAGNOSIS — K589 Irritable bowel syndrome without diarrhea: Secondary | ICD-10-CM | POA: Insufficient documentation

## 2015-12-16 DIAGNOSIS — Z8673 Personal history of transient ischemic attack (TIA), and cerebral infarction without residual deficits: Secondary | ICD-10-CM | POA: Diagnosis not present

## 2015-12-16 DIAGNOSIS — F419 Anxiety disorder, unspecified: Secondary | ICD-10-CM | POA: Insufficient documentation

## 2015-12-16 DIAGNOSIS — Z853 Personal history of malignant neoplasm of breast: Secondary | ICD-10-CM | POA: Diagnosis not present

## 2015-12-16 DIAGNOSIS — C20 Malignant neoplasm of rectum: Secondary | ICD-10-CM | POA: Insufficient documentation

## 2015-12-16 DIAGNOSIS — K219 Gastro-esophageal reflux disease without esophagitis: Secondary | ICD-10-CM | POA: Insufficient documentation

## 2015-12-16 DIAGNOSIS — M545 Low back pain: Secondary | ICD-10-CM | POA: Diagnosis not present

## 2015-12-16 DIAGNOSIS — K449 Diaphragmatic hernia without obstruction or gangrene: Secondary | ICD-10-CM | POA: Insufficient documentation

## 2015-12-16 DIAGNOSIS — M8580 Other specified disorders of bone density and structure, unspecified site: Secondary | ICD-10-CM | POA: Diagnosis not present

## 2015-12-16 DIAGNOSIS — I739 Peripheral vascular disease, unspecified: Secondary | ICD-10-CM | POA: Insufficient documentation

## 2015-12-16 DIAGNOSIS — I872 Venous insufficiency (chronic) (peripheral): Secondary | ICD-10-CM | POA: Diagnosis not present

## 2015-12-16 DIAGNOSIS — I509 Heart failure, unspecified: Secondary | ICD-10-CM | POA: Insufficient documentation

## 2015-12-16 DIAGNOSIS — I110 Hypertensive heart disease with heart failure: Secondary | ICD-10-CM | POA: Diagnosis not present

## 2015-12-16 DIAGNOSIS — K5790 Diverticulosis of intestine, part unspecified, without perforation or abscess without bleeding: Secondary | ICD-10-CM | POA: Insufficient documentation

## 2015-12-16 HISTORY — DX: Allergy, unspecified, initial encounter: T78.40XA

## 2015-12-16 HISTORY — DX: Malignant neoplasm of rectum: C20

## 2015-12-16 HISTORY — DX: Diverticulosis of intestine, part unspecified, without perforation or abscess without bleeding: K57.90

## 2015-12-16 HISTORY — DX: Malignant neoplasm of unspecified site of unspecified female breast: C50.919

## 2015-12-16 HISTORY — DX: Malignant neoplasm of colon, unspecified: C18.9

## 2015-12-16 NOTE — Progress Notes (Signed)
Please see the Nurse Progress Note in the MD Initial Consult Encounter for this patient. 

## 2015-12-16 NOTE — Telephone Encounter (Signed)
Call Daughter [place] @ [phone]

## 2015-12-16 NOTE — Telephone Encounter (Signed)
Pt's daughter states she will try and get her mother to the appt.  She was advised of the importance of the procedure

## 2015-12-16 NOTE — Telephone Encounter (Signed)
No answer on the daughters number and message left on the pt number
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
## 2015-12-17 ENCOUNTER — Ambulatory Visit (HOSPITAL_COMMUNITY)
Admission: RE | Admit: 2015-12-17 | Discharge: 2015-12-17 | Disposition: A | Discharge status: Home / Self Care | Payer: Medicare Other | Source: Ambulatory Visit | Attending: Gastroenterology | Admitting: Gastroenterology

## 2015-12-17 ENCOUNTER — Telehealth: Payer: Self-pay | Admitting: *Deleted

## 2015-12-17 ENCOUNTER — Encounter (HOSPITAL_COMMUNITY): Payer: Self-pay

## 2015-12-17 ENCOUNTER — Encounter (HOSPITAL_COMMUNITY)
Admission: RE | Disposition: A | Discharge status: Home / Self Care | Payer: Self-pay | Source: Ambulatory Visit | Attending: Gastroenterology

## 2015-12-17 ENCOUNTER — Telehealth: Payer: Self-pay | Admitting: Oncology

## 2015-12-17 DIAGNOSIS — Z96659 Presence of unspecified artificial knee joint: Secondary | ICD-10-CM | POA: Diagnosis not present

## 2015-12-17 DIAGNOSIS — I110 Hypertensive heart disease with heart failure: Secondary | ICD-10-CM | POA: Diagnosis not present

## 2015-12-17 DIAGNOSIS — Z79891 Long term (current) use of opiate analgesic: Secondary | ICD-10-CM | POA: Diagnosis not present

## 2015-12-17 DIAGNOSIS — Z8673 Personal history of transient ischemic attack (TIA), and cerebral infarction without residual deficits: Secondary | ICD-10-CM | POA: Diagnosis not present

## 2015-12-17 DIAGNOSIS — Z853 Personal history of malignant neoplasm of breast: Secondary | ICD-10-CM | POA: Diagnosis not present

## 2015-12-17 DIAGNOSIS — K921 Melena: Secondary | ICD-10-CM | POA: Diagnosis not present

## 2015-12-17 DIAGNOSIS — K219 Gastro-esophageal reflux disease without esophagitis: Secondary | ICD-10-CM | POA: Insufficient documentation

## 2015-12-17 DIAGNOSIS — C218 Malignant neoplasm of overlapping sites of rectum, anus and anal canal: Secondary | ICD-10-CM

## 2015-12-17 DIAGNOSIS — E039 Hypothyroidism, unspecified: Secondary | ICD-10-CM | POA: Diagnosis not present

## 2015-12-17 DIAGNOSIS — C19 Malignant neoplasm of rectosigmoid junction: Secondary | ICD-10-CM | POA: Insufficient documentation

## 2015-12-17 DIAGNOSIS — F419 Anxiety disorder, unspecified: Secondary | ICD-10-CM | POA: Diagnosis not present

## 2015-12-17 DIAGNOSIS — C20 Malignant neoplasm of rectum: Secondary | ICD-10-CM | POA: Diagnosis not present

## 2015-12-17 DIAGNOSIS — Z7982 Long term (current) use of aspirin: Secondary | ICD-10-CM | POA: Diagnosis not present

## 2015-12-17 DIAGNOSIS — Z7901 Long term (current) use of anticoagulants: Secondary | ICD-10-CM | POA: Diagnosis not present

## 2015-12-17 DIAGNOSIS — I509 Heart failure, unspecified: Secondary | ICD-10-CM | POA: Insufficient documentation

## 2015-12-17 DIAGNOSIS — Z79899 Other long term (current) drug therapy: Secondary | ICD-10-CM | POA: Insufficient documentation

## 2015-12-17 HISTORY — PX: EUS: SHX5427

## 2015-12-17 SURGERY — ULTRASOUND, LOWER GI TRACT, ENDOSCOPIC
Anesthesia: Moderate Sedation

## 2015-12-17 MED ORDER — FENTANYL CITRATE (PF) 100 MCG/2ML IJ SOLN
INTRAMUSCULAR | Status: AC
  Filled 2015-12-17: qty 2

## 2015-12-17 MED ORDER — FENTANYL CITRATE (PF) 100 MCG/2ML IJ SOLN
INTRAMUSCULAR | Status: DC | PRN
  Administered 2015-12-17 (×2): 25 ug via INTRAVENOUS

## 2015-12-17 MED ORDER — MIDAZOLAM HCL 5 MG/ML IJ SOLN
INTRAMUSCULAR | Status: AC
  Filled 2015-12-17: qty 2

## 2015-12-17 MED ORDER — SODIUM CHLORIDE 0.9 % IV SOLN
INTRAVENOUS | Status: DC
  Administered 2015-12-17: 500 mL via INTRAVENOUS

## 2015-12-17 MED ORDER — MIDAZOLAM HCL 10 MG/2ML IJ SOLN
INTRAMUSCULAR | Status: DC | PRN
  Administered 2015-12-17 (×3): 1 mg via INTRAVENOUS

## 2015-12-17 NOTE — Interval H&P Note (Signed)
History and Physical Interval Note:  12/17/2015 7:41 AM  [name]  has presented today for surgery, with the diagnosis of rectal cancer  The various methods of treatment have been discussed with the patient and family. After consideration of risks, benefits and other options for treatment, the patient has consented to  Procedure(s): LOWER ENDOSCOPIC ULTRASOUND (EUS) (N/A) as a surgical intervention .  [name] history has been reviewed, patient examined, no change in status, stable for surgery.  I have reviewed the patient's chart and labs.  Questions were answered to the patient's satisfaction.     [name]

## 2015-12-17 NOTE — Telephone Encounter (Signed)
pts dtr is aware of appt. On 12/21/15@12 :30

## 2015-12-17 NOTE — Telephone Encounter (Signed)
CALLED PATIENT TO INFORM OF APPT. WITH DR. Leighton Ruff ON [id number] - ARRIVAL TIME - 9:10 AM - ADDRESS - [place], [address], PH. NO. [phone], LVM FOR A RETURN CALL

## 2015-12-17 NOTE — Discharge Instructions (Signed)
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
## 2015-12-17 NOTE — Op Note (Signed)
[facility] Patient Name: [name] Procedure Date: 12/17/2015 MRN: [id number] Attending MD: [name] , MD Date of Birth: [birth year] CSN: [id number] Age: 82 Admit Type: Outpatient Procedure:                Lower EUS Indications:              Pre-treatment staging for recently diagnosed                            recto-sigmoid adenocarcinoma; no clear metastatic                            disease on recent CT scans Providers:                [name], MD, Cleda Daub, RN, Otilio Saber, Technician Referring MD:             Wilfrid Lund, MD Medicines:                Fentanyl 50 micrograms IV, Midazolam 3 mg IV Complications:            No immediate complications. Estimated blood loss:                            None. Estimated Blood Loss:     Estimated blood loss: none. Procedure:                Pre-Anesthesia Assessment:                           - Prior to the procedure, a History and Physical                            was performed, and patient medications and                            allergies were reviewed. [name] tolerance of                            previous anesthesia was also reviewed. The risks                            and benefits of the procedure and the sedation                            options and risks were discussed with the patient.                            All questions were answered, and informed consent                            was obtained. Prior Anticoagulants: The patient has  taken no previous anticoagulant or antiplatelet                            agents. ASA Grade Assessment: II - A patient with                            mild systemic disease. After reviewing the risks                            and benefits, the patient was deemed in                            satisfactory condition to undergo the procedure.                           After  obtaining informed consent, the endoscope was                            passed under direct vision. Throughout the                            procedure, the patient's blood pressure, pulse, and                            oxygen saturations were monitored continuously. The                            [id number]) scope was introduced through                            the anus and advanced to the the sigmoid colon for                            ultrasound. The lower EUS was accomplished without                            difficulty. The patient tolerated the procedure                            well. The quality of the bowel preparation was good. Scope In: Scope Out: Findings:      Endoscopic Finding :      1. A fungating partially obstructing medium-sized mass was found in the       recto-sigmoid colon. The mass was partially circumferential (involving       one-half of the lumen circumference). The mass measured four cm in       length.      Endosonographic Finding :      1. The mass above correlated with a hypoechoic mass was found at the       rectosigmoid junction. The distal end of the mass was 12cm from the anal       verge. The mass was partially circumferential (involving 50% of the       lumen). The mass measured 40 mm (in maximum length) by 24 mm (in maximum  width) by 9 mm (in maximum thickness). There was sonographic evidence       suggesting invasion into the muscularis propria (Layer 4) without       breakthrough into the perirectal fat (uT2).      2. There was one, small but round (4.50mm) and well circumscribed       (suspicious) perirectal lymphnode (uN1) Impression:               - 4cm long, non-circumferential, 4cm long, uT2N1                            rectosigmoid adenocarcinoma with distal edge                            located 12cm from the anus.                           - No specimens collected. Moderate Sedation:      Moderate (conscious)  sedation was administered by the endoscopy nurse       and supervised by the endoscopist. The following parameters were       monitored: oxygen saturation, heart rate, blood pressure, and response       to care. Total physician intraservice time was 20 minutes. Recommendation:           - Discharge patient to home (ambulatory).                           - She will likely benefit from neoadjuvant                            chemo/XRT given N1 staging. Procedure Code(s):        --- Professional ---                           [phone], Sigmoidoscopy, flexible; with endoscopic                            ultrasound examination                           99152, Moderate sedation services provided by the                            same physician or other qualified health care                            professional performing the diagnostic or                            therapeutic service that the sedation supports,                            requiring the presence of an independent trained                            observer to assist in the monitoring of the  patient's level of consciousness and physiological                            status; initial 15 minutes of intraservice time,                            patient age 35 years or older Diagnosis Code(s):        --- Professional ---                           C19, Malignant neoplasm of rectosigmoid junction                           C20, Malignant neoplasm of rectum                           C21.8, Malignant neoplasm of overlapping sites of                            rectum, anus and anal canal CPT copyright 2016 American Medical Association. All rights reserved. The codes documented in this report are preliminary and upon coder review may  be revised to meet current compliance requirements. [name], MD 12/17/2015 9:27:21 AM This report has been signed electronically. Number of Addenda: 0

## 2015-12-21 ENCOUNTER — Telehealth: Payer: Self-pay | Admitting: Oncology

## 2015-12-21 ENCOUNTER — Ambulatory Visit (HOSPITAL_BASED_OUTPATIENT_CLINIC_OR_DEPARTMENT_OTHER): Payer: Medicare Other | Admitting: Oncology

## 2015-12-21 ENCOUNTER — Encounter: Payer: Self-pay | Admitting: *Deleted

## 2015-12-21 ENCOUNTER — Encounter (HOSPITAL_COMMUNITY): Payer: Self-pay | Admitting: Gastroenterology

## 2015-12-21 DIAGNOSIS — I10 Essential (primary) hypertension: Secondary | ICD-10-CM | POA: Diagnosis not present

## 2015-12-21 DIAGNOSIS — C20 Malignant neoplasm of rectum: Secondary | ICD-10-CM | POA: Diagnosis not present

## 2015-12-21 DIAGNOSIS — G459 Transient cerebral ischemic attack, unspecified: Secondary | ICD-10-CM | POA: Diagnosis not present

## 2015-12-21 DIAGNOSIS — Z86711 Personal history of pulmonary embolism: Secondary | ICD-10-CM

## 2015-12-21 DIAGNOSIS — K625 Hemorrhage of anus and rectum: Secondary | ICD-10-CM | POA: Diagnosis not present

## 2015-12-21 DIAGNOSIS — Z853 Personal history of malignant neoplasm of breast: Secondary | ICD-10-CM

## 2015-12-21 NOTE — Telephone Encounter (Signed)
Gave pt cal & avs °
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
## 2015-12-21 NOTE — Patient Instructions (Signed)
Care Plan Summary- 12/21/2015 Name:  Haylen Viau      DOB: 09-10-1924 Your Medical Team:  Medical Oncologist:  Dr. Ma Rings Radiation Oncologist:  Dr. Kyung Rudd Surgeon:   Dr. Leighton Ruff Type of Cancer: Rectal Cancer--adenocarcinoma  Stage/Grade: Stage III *Exact staging of your cancer is based on size of the tumor, depth of invasion, involvement of lymph nodes or not, and whether or not the cancer has spread beyond the primary site.   Recommendations: Based on information available as of today's consult. Recommendations may change depending on the results of further tests or exams. 1) Surgery 1st vs. Chemo/radiation 1st 2)  3)  ______________________________________________________________________________ Next Steps: 1) Will discuss case at tumor board this week and call you with recommendations 2) Follow up with Dr. Marcello Moores on 01/04/16 at [place] as scheduled 3) Follow up with Dr. Benay Spice on 02/01/16  Questions? Merceda Elks, RN, BSN at [phone]. Manuela Schwartz is your Oncology Nurse Navigator and is available to assist you while you're receiving your medical care at [facility].

## 2015-12-23 ENCOUNTER — Telehealth: Payer: Self-pay | Admitting: *Deleted

## 2015-12-23 ENCOUNTER — Telehealth: Payer: Self-pay | Admitting: Radiation Oncology

## 2015-12-23 NOTE — Telephone Encounter (Addendum)
LM for pts daughter to discuss conference recommendations.  Ivin Booty called me back and we discussed the conference recommendation to meet with Dr. Marcello Moores in hopes of surgery upfront followed by chemo. Radiation may not be needed depending on surgical findings.

## 2015-12-23 NOTE — Telephone Encounter (Signed)
  Oncology Nurse Navigator Documentation  Navigator Location: [facility] (12/23/15 1220) Navigator Encounter Type: Telephone (12/23/15 1220) Telephone: Outgoing Call;Patient Update (12/23/15 1220)   Notified daughter of tumor board discussion today. Dr. Marcello Moores was not present, but two of her partners feel that w/location of tumor surgery without an ostomy is possible, unless something is found that is not expected. Suggested they keep appointment with Dr. Marcello Moores on 01/04/16 as scheduled.                                       Time Spent with Patient: 15 (12/23/15 1220)

## 2015-12-25 ENCOUNTER — Encounter: Payer: Self-pay | Admitting: *Deleted

## 2015-12-25 NOTE — Progress Notes (Signed)
[facility] Psychosocial Distress Screening Clinical Social Work  Clinical Social Work was referred by distress screening protocol.  The patient scored a 6 on the Psychosocial Distress Thermometer which indicates moderate distress. Clinical Social Worker phoned pt to assess for distress and other psychosocial needs. CSW left short vm for pt's daughter introducing self and explaining role of CSW Emergency planning/management officer. CSW available to assist as needed.   ON[facility] DISTRESS SCREENING 12/16/2015  Screening Type Initial Screening  Distress experienced in past week (1-10) 6  Emotional problem type Adjusting to illness  Spiritual/Religous concerns type Relating to God  Physical Problem type Pain;Sleep/insomnia;Constipation/diarrhea  Physician notified of physical symptoms Yes  Referral to clinical social work Yes    Clinical Social Worker follow up needed: Yes.    If yes, follow up plan: CSW awaits return call and will attempt to see at future treatment appointments.  Loren Racer, [place] Worker [place]  [place] Phone: [phone] Fax: [phone]

## 2016-01-04 ENCOUNTER — Other Ambulatory Visit: Payer: Self-pay | Admitting: General Surgery

## 2016-01-04 DIAGNOSIS — C20 Malignant neoplasm of rectum: Secondary | ICD-10-CM | POA: Diagnosis not present

## 2016-01-04 NOTE — H&P (Signed)
History of Present Illness [name] Ruff MD; 0000000 9:54 AM) The patient is a 82 year old female who presents with colorectal cancer. 82 year old female who presents to the office with a newly diagnosed rectal cancer. She was noted to have rectal bleeding. Colonoscopy revealed a mass in the mid to proximal rectum. Biopsies confirmed adenocarcinoma. CEA was normal. CT scan show no signs of metastatic disease. Ultrasound shows a T2 N1 lesion.   Other Problems Elbert Ewings, CMA; 01/04/2016 9:19 AM) Anxiety Disorder Arthritis Breast Cancer Colon Cancer Gastroesophageal Reflux Disease Heart murmur High blood pressure Thyroid Disease  Past Surgical History Elbert Ewings, CMA; 01/04/2016 9:19 AM) Breast Biopsy Right. Breast Reconstruction Right. Knee Surgery Right. Mammoplasty; Reduction Right. Mastectomy Right. Spinal Surgery - Lower Back Spinal Surgery - Neck  Diagnostic Studies History Elbert Ewings, CMA; 01/04/2016 9:19 AM) Mammogram 1-3 years ago  Allergies Elbert Ewings, CMA; 01/04/2016 9:20 AM) Codeine Sulfate *ANALGESICS - OPIOID* Pregabalin *CHEMICALS*  Medication History Elbert Ewings, CMA; 01/04/2016 9:23 AM) Dulcolax (5MG  Tablet DR, Oral) Active. NexIUM (40MG  Capsule DR, Oral) Active. Furosemide (20MG  Tablet, Oral) Active. Gabapentin (100MG  Capsule, Oral) Active. Hydrocodone-Acetaminophen (5-325MG  Tablet, Oral) Active. Levothyroxine Sodium (100MCG Tablet, Oral) Active. LORazepam (1MG  Tablet, Oral) Active. Multi-Day Vitamins (Oral) Active. Naproxen Sodium (220MG  Capsule, Oral) Active. Potassium Chloride ER (20MEQ Tablet ER, Oral) Active. Zantac (150MG  Tablet, Oral) Active. Voltaren (1% Gel, Transdermal) Active. Medications Reconciled  Social History Elbert Ewings, [place]; 01/04/2016 9:19 AM) Caffeine use Carbonated beverages, Coffee. Tobacco use Never smoker.  Family History Elbert Ewings, [place]; 01/04/2016 9:19 AM) Alcohol Abuse  Father. Prostate Cancer Father.  Pregnancy / Birth History Elbert Ewings, [place]; 01/04/2016 9:19 AM) Age of menopause [age] Gravida 2 Maternal age 35-25 Para 2     Review of Systems Elbert Ewings CMA; 01/04/2016 9:19 AM) Eldridge Dace Present- Wears glasses/contact lenses. Not Present- Earache, Hearing Loss, Hoarseness, Nose Bleed, Oral Ulcers, Ringing in the Ears, Seasonal Allergies, Sinus Pain, Sore Throat, Visual Disturbances and Yellow Eyes. Cardiovascular Present- Swelling of Extremities. Not Present- Chest Pain, Difficulty Breathing Lying Down, Leg Cramps, Palpitations, Rapid Heart Rate and Shortness of Breath. Gastrointestinal Present- Abdominal Pain, Bloody Stool and Change in Bowel Habits. Not Present- Bloating, Chronic diarrhea, Constipation, Difficulty Swallowing, Excessive gas, Gets full quickly at meals, Hemorrhoids, Indigestion, Nausea, Rectal Pain and Vomiting. Musculoskeletal Present- Back Pain and Joint Pain. Not Present- Joint Stiffness, Muscle Pain, Muscle Weakness and Swelling of Extremities. Neurological Present- Weakness. Not Present- Decreased Memory, Fainting, Headaches, Numbness, Seizures, Tingling, Tremor and Trouble walking. Psychiatric Present- Anxiety. Not Present- Bipolar, Change in Sleep Pattern, Depression, Fearful and Frequent crying.  Vitals Elbert Ewings CMA; 01/04/2016 9:23 AM) 01/04/2016 9:23 AM Weight: 170 lb Height: 66in Body Surface Area: 1.87 m Body Mass Index: 27.44 kg/m  Temp.: 98.79F  Pulse: 68 (Regular)  BP: 122/74 (Sitting, Left Arm, Standard)      Physical Exam [name] Ruff MD; 0000000 10:46 AM)  General Mental Status-Alert. General Appearance-Not in acute distress. Build & Nutrition-Well nourished. Posture-Normal posture. Gait-Normal.  Head and Neck Head-normocephalic, atraumatic with no lesions or palpable masses. Trachea-midline. Thyroid Gland Characteristics - normal size and consistency and no palpable  nodules.  Chest and Lung Exam Chest and lung exam reveals -on auscultation, normal breath sounds, no adventitious sounds and normal vocal resonance.  Cardiovascular Cardiovascular examination reveals -normal heart sounds, regular rate and rhythm with no murmurs and femoral artery auscultation bilaterally reveals normal pulses, no bruits, no thrills.  Abdomen Inspection Inspection of the abdomen reveals - No Hernias. Palpation/Percussion  Palpation and Percussion of the abdomen reveal - Soft, Non Tender, No Rigidity (guarding), No hepatosplenomegaly and No Palpable abdominal masses.  Rectal Anorectal Exam External - normal external exam. Internal - normal internal exam. Note: no mass palpated.  Neurologic Neurologic evaluation reveals -alert and oriented x 3 with no impairment of recent or remote memory, normal attention span and ability to concentrate, normal sensation and normal coordination.  Musculoskeletal Normal Exam - Bilateral-Upper Extremity Strength Normal and Lower Extremity Strength Normal.    Assessment & Plan [name] Ruff MD; 0000000 9:58 AM)  RECTAL CANCER (C20) Impression: 82 year old female with most likely a proximal rectal cancer. This has been tattooed. Ultrasound shows a T2 N1 lesion. I will discuss this with Dr. Lisbeth Renshaw.  We discussed the risks and benefits of radiation, including the increased survival rate with neoadjuvant ChemoRT.  It appears unlikely that the patient will benefit much from radiation, but I will confirm this with the Radiation Oncologist. She is not really interested in radiation therapy due to concern for side effects. I have recommended a robotic low anterior resection with primary anastomosis. We will request medical clearance from [name] prior to surgery.    The surgery and anatomy were described to the patient as well as the risks of surgery and the possible complications. These include: Bleeding, deep abdominal infections and  possible wound complications such as hernia and infection, damage to adjacent structures, leak of surgical connections, which can lead to other surgeries and possibly an ostomy, possible need for other procedures, such as abscess drains in radiology, possible prolonged hospital stay, possible diarrhea from removal of part of the colon, possible constipation from narcotics, possible bowel, bladder or sexual dysfunction if having rectal surgery, prolonged fatigue/weakness or appetite loss, possible early recurrence of of disease, possible complications of their medical problems such as heart disease or arrhythmias or lung problems, death (less than 1%). I believe the patient understands and wishes to proceed with the surgery.

## 2016-01-14 ENCOUNTER — Other Ambulatory Visit: Payer: Self-pay | Admitting: *Deleted

## 2016-01-14 NOTE — Progress Notes (Signed)
Surgery on 01/27/16. Will move her 7/31 f/u with Dr. Benay Spice out a few weeks. POF to scheduler

## 2016-01-19 ENCOUNTER — Telehealth: Payer: Self-pay | Admitting: Oncology

## 2016-01-19 NOTE — Telephone Encounter (Signed)
Called patient to confirm 8/14 appointment. Left Voice mail. Letter and schedule mailed.

## 2016-01-19 NOTE — Telephone Encounter (Signed)
Correction f/u 8/18..............Marland Kitchen Merleen Nicely

## 2016-01-20 DIAGNOSIS — Z853 Personal history of malignant neoplasm of breast: Secondary | ICD-10-CM | POA: Diagnosis not present

## 2016-01-20 DIAGNOSIS — E039 Hypothyroidism, unspecified: Secondary | ICD-10-CM | POA: Diagnosis not present

## 2016-01-20 DIAGNOSIS — G459 Transient cerebral ischemic attack, unspecified: Secondary | ICD-10-CM | POA: Diagnosis not present

## 2016-01-20 DIAGNOSIS — C20 Malignant neoplasm of rectum: Secondary | ICD-10-CM | POA: Diagnosis not present

## 2016-01-20 DIAGNOSIS — R413 Other amnesia: Secondary | ICD-10-CM | POA: Diagnosis not present

## 2016-01-20 DIAGNOSIS — Z79899 Other long term (current) drug therapy: Secondary | ICD-10-CM | POA: Diagnosis not present

## 2016-01-20 DIAGNOSIS — F419 Anxiety disorder, unspecified: Secondary | ICD-10-CM | POA: Diagnosis not present

## 2016-01-22 ENCOUNTER — Other Ambulatory Visit (HOSPITAL_COMMUNITY): Payer: Self-pay | Admitting: Anesthesiology

## 2016-01-22 NOTE — Patient Instructions (Addendum)
Ziqi Reimer  01/22/2016   Your procedure is scheduled on: 01/27/16  Report to [facility] Main  Entrance take [facility]  elevators to 3rd floor to  [place] at 0930 AM.  Call this number if you have problems the morning of surgery [phone]   Remember: ONLY 1 PERSON MAY GO WITH YOU TO SHORT STAY TO GET  READY MORNING OF [place].  DO NOT TAKE ANYTHING BY MOUTH AFTER MIDNIGHT  BOWEL PREP AS PER OFFICE  Take these medicines the morning of surgery with A SIP OF WATER: Nexium, Levothyroxine, Isosorbide MAY TAKE HYDROCODONE, Lorazepam if needed                                You may not have any metal on your body including hair pins and              piercings  Do not wear jewelry, make-up, lotions, powders or perfumes, deodorant             Do not wear nail polish.  Do not shave  48 hours prior to surgery.              Men may shave face and neck.   Do not bring valuables to the hospital. [place].  Contacts, dentures or bridgework may not be worn into surgery.  Leave suitcase in the car. After surgery it may be brought to your room.              [facility] - Preparing for Surgery Before surgery, you can play an important role.  Because skin is not sterile, your skin needs to be as free of germs as possible.  You can reduce the number of germs on your skin by washing with CHG (chlorahexidine gluconate) soap before surgery.  CHG is an antiseptic cleaner which kills germs and bonds with the skin to continue killing germs even after washing. Please DO NOT use if you have an allergy to CHG or antibacterial soaps.  If your skin becomes reddened/irritated stop using the CHG and inform your nurse when you arrive at Short Stay. Do not shave (including legs and underarms) for at least 48 hours prior to the first CHG shower.  You may shave your face/neck. Please follow these instructions carefully:  1.  Shower  with CHG Soap the night before surgery and the  morning of Surgery.  2.  If you choose to wash your hair, wash your hair first as usual with your  normal  shampoo.  3.  After you shampoo, rinse your hair and body thoroughly to remove the  shampoo.                           4.  Use CHG as you would any other liquid soap.  You can apply chg directly  to the skin and wash                       Gently with a scrungie or clean washcloth.  5.  Apply the CHG Soap to your body ONLY FROM THE NECK DOWN.   Do not use on face/ open  Wound or open sores. Avoid contact with eyes, ears mouth and genitals (private parts).                       Wash face,  Genitals (private parts) with your normal soap.             6.  Wash thoroughly, paying special attention to the area where your surgery  will be performed.  7.  Thoroughly rinse your body with warm water from the neck down.  8.  DO NOT shower/wash with your normal soap after using and rinsing off  the CHG Soap.                9.  Pat yourself dry with a clean towel.            10.  Wear clean pajamas.            11.  Place clean sheets on your bed the night of your first shower and do not  sleep with pets. Day of Surgery : Do not apply any lotions/deodorants the morning of surgery.  Please wear clean clothes to the hospital/surgery center.  FAILURE TO FOLLOW THESE INSTRUCTIONS MAY RESULT IN THE CANCELLATION OF YOUR SURGERY PATIENT SIGNATURE_________________________________  NURSE SIGNATURE__________________________________  ________________________________________________________________________    CLEAR LIQUID DIET   Foods Allowed                                                                     Foods Excluded  Coffee and tea, regular and decaf                             liquids that you cannot  Plain Jell-O in any flavor                                             see through such as: Fruit ices (not with fruit pulp)                                      milk, soups, orange juice  Iced Popsicles                                    All solid food Carbonated beverages, regular and diet                                    Cranberry, grape and apple juices Sports drinks like Gatorade Lightly seasoned clear broth or consume(fat free) Sugar, honey syrup  Sample Menu Breakfast                                Lunch  Supper Cranberry juice                    Beef broth                            Chicken broth Jell-O                                     Grape juice                           Apple juice Coffee or tea                        Jell-O                                      Popsicle                                                Coffee or tea                        Coffee or tea  _____________________________________________________________________

## 2016-01-25 ENCOUNTER — Encounter (HOSPITAL_COMMUNITY): Payer: Self-pay

## 2016-01-25 ENCOUNTER — Encounter (HOSPITAL_COMMUNITY)
Admission: RE | Admit: 2016-01-25 | Discharge: 2016-01-25 | Disposition: A | Discharge status: Home / Self Care | Payer: Medicare Other | Source: Ambulatory Visit | Attending: General Surgery | Admitting: General Surgery

## 2016-01-25 DIAGNOSIS — F419 Anxiety disorder, unspecified: Secondary | ICD-10-CM | POA: Diagnosis not present

## 2016-01-25 DIAGNOSIS — Z79891 Long term (current) use of opiate analgesic: Secondary | ICD-10-CM | POA: Diagnosis not present

## 2016-01-25 DIAGNOSIS — Z885 Allergy status to narcotic agent status: Secondary | ICD-10-CM | POA: Diagnosis not present

## 2016-01-25 DIAGNOSIS — Z888 Allergy status to other drugs, medicaments and biological substances status: Secondary | ICD-10-CM | POA: Diagnosis not present

## 2016-01-25 DIAGNOSIS — Z791 Long term (current) use of non-steroidal anti-inflammatories (NSAID): Secondary | ICD-10-CM | POA: Diagnosis not present

## 2016-01-25 DIAGNOSIS — Z79899 Other long term (current) drug therapy: Secondary | ICD-10-CM | POA: Diagnosis not present

## 2016-01-25 DIAGNOSIS — C20 Malignant neoplasm of rectum: Secondary | ICD-10-CM | POA: Diagnosis not present

## 2016-01-25 DIAGNOSIS — Z853 Personal history of malignant neoplasm of breast: Secondary | ICD-10-CM | POA: Diagnosis not present

## 2016-01-25 DIAGNOSIS — Z9011 Acquired absence of right breast and nipple: Secondary | ICD-10-CM | POA: Diagnosis not present

## 2016-01-25 DIAGNOSIS — I10 Essential (primary) hypertension: Secondary | ICD-10-CM | POA: Diagnosis not present

## 2016-01-25 DIAGNOSIS — K219 Gastro-esophageal reflux disease without esophagitis: Secondary | ICD-10-CM | POA: Diagnosis not present

## 2016-01-25 HISTORY — DX: Cardiac murmur, unspecified: R01.1

## 2016-01-25 HISTORY — DX: Essential (primary) hypertension: I10

## 2016-01-25 LAB — BASIC METABOLIC PANEL
Anion gap: 5 (ref 5–15)
BUN: 12 mg/dL (ref 6–20)
CALCIUM: 9.7 mg/dL (ref 8.9–10.3)
CO2: 27 mmol/L (ref 22–32)
Chloride: 106 mmol/L (ref 101–111)
Creatinine, Ser: 0.88 mg/dL (ref 0.44–1.00)
GFR calc Af Amer: >60 mL/min (ref >60)
GFR, EST NON AFRICAN AMERICAN: 57 mL/min — AB (ref >60)
GLUCOSE: 107 mg/dL — AB (ref 65–99)
POTASSIUM: 4.9 mmol/L (ref 3.5–5.1)
Sodium: 138 mmol/L (ref 135–145)

## 2016-01-25 LAB — CBC
HEMATOCRIT: 43.5 % (ref 36.0–46.0)
Hemoglobin: 13.8 g/dL (ref 12.0–15.0)
MCH: 28.9 pg (ref 26.0–34.0)
MCHC: 31.7 g/dL (ref 30.0–36.0)
MCV: 91.2 fL (ref 78.0–100.0)
Platelets: 219 10*3/uL (ref 150–400)
RBC: 4.77 MIL/uL (ref 3.87–5.11)
RDW: 14.6 % (ref 11.5–15.5)
WBC: 7.1 10*3/uL (ref 4.0–10.5)

## 2016-01-25 NOTE — Progress Notes (Signed)
Chest ct 6/17 eccho 8/15 Both in epic

## 2016-01-26 NOTE — Progress Notes (Signed)
lov dr polite on chart

## 2016-01-27 ENCOUNTER — Inpatient Hospital Stay (HOSPITAL_COMMUNITY): Payer: Medicare Other | Admitting: Anesthesiology

## 2016-01-27 ENCOUNTER — Inpatient Hospital Stay (HOSPITAL_COMMUNITY)
Admission: RE | Admit: 2016-01-27 | Discharge: 2016-02-01 | DRG: 334 | Disposition: A | Discharge status: Home Health | Payer: Medicare Other | Source: Ambulatory Visit | Attending: General Surgery | Admitting: General Surgery

## 2016-01-27 ENCOUNTER — Encounter (HOSPITAL_COMMUNITY)
Admission: RE | Disposition: A | Discharge status: Home Health | Payer: Self-pay | Source: Ambulatory Visit | Attending: General Surgery

## 2016-01-27 ENCOUNTER — Encounter (HOSPITAL_COMMUNITY): Payer: Self-pay

## 2016-01-27 DIAGNOSIS — I739 Peripheral vascular disease, unspecified: Secondary | ICD-10-CM | POA: Diagnosis not present

## 2016-01-27 DIAGNOSIS — K219 Gastro-esophageal reflux disease without esophagitis: Secondary | ICD-10-CM | POA: Diagnosis present

## 2016-01-27 DIAGNOSIS — C20 Malignant neoplasm of rectum: Principal | ICD-10-CM | POA: Diagnosis present

## 2016-01-27 DIAGNOSIS — Z853 Personal history of malignant neoplasm of breast: Secondary | ICD-10-CM

## 2016-01-27 DIAGNOSIS — Z9011 Acquired absence of right breast and nipple: Secondary | ICD-10-CM

## 2016-01-27 DIAGNOSIS — J449 Chronic obstructive pulmonary disease, unspecified: Secondary | ICD-10-CM | POA: Diagnosis not present

## 2016-01-27 DIAGNOSIS — Z885 Allergy status to narcotic agent status: Secondary | ICD-10-CM | POA: Diagnosis not present

## 2016-01-27 DIAGNOSIS — Z79899 Other long term (current) drug therapy: Secondary | ICD-10-CM | POA: Diagnosis not present

## 2016-01-27 DIAGNOSIS — Z791 Long term (current) use of non-steroidal anti-inflammatories (NSAID): Secondary | ICD-10-CM | POA: Diagnosis not present

## 2016-01-27 DIAGNOSIS — Z79891 Long term (current) use of opiate analgesic: Secondary | ICD-10-CM

## 2016-01-27 DIAGNOSIS — I10 Essential (primary) hypertension: Secondary | ICD-10-CM | POA: Diagnosis not present

## 2016-01-27 DIAGNOSIS — F419 Anxiety disorder, unspecified: Secondary | ICD-10-CM | POA: Diagnosis not present

## 2016-01-27 DIAGNOSIS — Z888 Allergy status to other drugs, medicaments and biological substances status: Secondary | ICD-10-CM | POA: Diagnosis not present

## 2016-01-27 DIAGNOSIS — K6389 Other specified diseases of intestine: Secondary | ICD-10-CM | POA: Diagnosis not present

## 2016-01-27 HISTORY — PX: XI ROBOTIC ASSISTED LOWER ANTERIOR RESECTION: SHX6558

## 2016-01-27 LAB — TYPE AND SCREEN
ABO/RH(D): A NEG
ANTIBODY SCREEN: NEGATIVE

## 2016-01-27 LAB — HEMOGLOBIN A1C
Hgb A1c MFr Bld: 5.6 % (ref 4.8–5.6)
Mean Plasma Glucose: 114 mg/dL

## 2016-01-27 SURGERY — RESECTION, RECTUM, LOW ANTERIOR, ROBOT-ASSISTED
Anesthesia: General | Site: Abdomen

## 2016-01-27 MED ORDER — SUGAMMADEX SODIUM 200 MG/2ML IV SOLN
INTRAVENOUS | Status: DC | PRN
  Administered 2016-01-27: 200 mg via INTRAVENOUS

## 2016-01-27 MED ORDER — LEVOTHYROXINE SODIUM 100 MCG PO TABS
100.0000 ug | ORAL_TABLET | Freq: Every day | ORAL | Status: DC
Start: 2016-01-28 — End: 2016-02-01
  Administered 2016-01-28 – 2016-02-01 (×5): 100 ug via ORAL
  Filled 2016-01-27 (×5): qty 1

## 2016-01-27 MED ORDER — SCOPOLAMINE 1 MG/3DAYS TD PT72
MEDICATED_PATCH | TRANSDERMAL | Status: AC
  Filled 2016-01-27: qty 1

## 2016-01-27 MED ORDER — DEXTROSE 5 % IV SOLN
2.0000 g | Freq: Two times a day (BID) | INTRAVENOUS | Status: AC
  Administered 2016-01-27: 2 g via INTRAVENOUS
  Filled 2016-01-27: qty 2

## 2016-01-27 MED ORDER — NAPROXEN SODIUM 220 MG PO TABS: 220.0000 mg | ORAL_TABLET | Freq: Two times a day (BID) | ORAL | Status: DC | PRN

## 2016-01-27 MED ORDER — DIPHENHYDRAMINE HCL 50 MG/ML IJ SOLN: 12.5000 mg | Freq: Four times a day (QID) | INTRAMUSCULAR | Status: DC | PRN

## 2016-01-27 MED ORDER — LABETALOL HCL 5 MG/ML IV SOLN
INTRAVENOUS | Status: DC | PRN
  Administered 2016-01-27: 5 mg via INTRAVENOUS

## 2016-01-27 MED ORDER — LIDOCAINE HCL (CARDIAC) 20 MG/ML IV SOLN
INTRAVENOUS | Status: AC
  Filled 2016-01-27: qty 5

## 2016-01-27 MED ORDER — ONDANSETRON HCL 4 MG/2ML IJ SOLN: 4.0000 mg | Freq: Four times a day (QID) | INTRAMUSCULAR | Status: DC | PRN

## 2016-01-27 MED ORDER — ACETAMINOPHEN 10 MG/ML IV SOLN
1000.0000 mg | Freq: Once | INTRAVENOUS | Status: AC
  Administered 2016-01-27: 1000 mg via INTRAVENOUS

## 2016-01-27 MED ORDER — HYDROMORPHONE HCL 1 MG/ML IJ SOLN
0.2500 mg | INTRAMUSCULAR | Status: DC | PRN
  Administered 2016-01-27: 0.25 mg via INTRAVENOUS

## 2016-01-27 MED ORDER — ALVIMOPAN 12 MG PO CAPS
12.0000 mg | ORAL_CAPSULE | Freq: Once | ORAL | Status: AC
  Administered 2016-01-27: 12 mg via ORAL
  Filled 2016-01-27: qty 1

## 2016-01-27 MED ORDER — HYDROCODONE-ACETAMINOPHEN 5-325 MG PO TABS
1.0000 | ORAL_TABLET | Freq: Four times a day (QID) | ORAL | Status: DC | PRN
Start: 2016-01-27 — End: 2016-02-01
  Administered 2016-01-27 – 2016-01-28 (×3): 1 via ORAL
  Administered 2016-01-28: 2 via ORAL
  Administered 2016-01-29: 1 via ORAL
  Administered 2016-01-29: 2 via ORAL
  Administered 2016-01-29 – 2016-01-30 (×2): 1 via ORAL
  Administered 2016-01-30: 2 via ORAL
  Administered 2016-01-30 (×2): 1 via ORAL
  Administered 2016-01-31 – 2016-02-01 (×3): 2 via ORAL
  Filled 2016-01-27: qty 1
  Filled 2016-01-27: qty 2
  Filled 2016-01-27: qty 1
  Filled 2016-01-27: qty 2
  Filled 2016-01-27 (×2): qty 1
  Filled 2016-01-27: qty 2
  Filled 2016-01-27 (×2): qty 1
  Filled 2016-01-27 (×2): qty 2
  Filled 2016-01-27 (×2): qty 1
  Filled 2016-01-27: qty 2
  Filled 2016-01-27: qty 1

## 2016-01-27 MED ORDER — SUGAMMADEX SODIUM 200 MG/2ML IV SOLN
INTRAVENOUS | Status: AC
Start: 2016-01-27 — End: 2016-01-27
  Filled 2016-01-27: qty 2

## 2016-01-27 MED ORDER — PROPOFOL 10 MG/ML IV BOLUS
INTRAVENOUS | Status: DC | PRN
  Administered 2016-01-27: 120 mg via INTRAVENOUS

## 2016-01-27 MED ORDER — ONDANSETRON HCL 4 MG/2ML IJ SOLN
INTRAMUSCULAR | Status: DC | PRN
  Administered 2016-01-27: 4 mg via INTRAVENOUS

## 2016-01-27 MED ORDER — ISOSORBIDE MONONITRATE ER 30 MG PO TB24
30.0000 mg | ORAL_TABLET | Freq: Every day | ORAL | Status: DC
  Administered 2016-01-27 – 2016-02-01 (×6): 30 mg via ORAL
  Filled 2016-01-27 (×6): qty 1

## 2016-01-27 MED ORDER — LACTATED RINGERS IR SOLN
Status: DC | PRN
  Administered 2016-01-27: 1000 mL

## 2016-01-27 MED ORDER — FAMOTIDINE 20 MG PO TABS
20.0000 mg | ORAL_TABLET | Freq: Every day | ORAL | Status: DC
  Administered 2016-01-27 – 2016-01-31 (×5): 20 mg via ORAL
  Filled 2016-01-27 (×5): qty 1

## 2016-01-27 MED ORDER — ACETAMINOPHEN 10 MG/ML IV SOLN
INTRAVENOUS | Status: AC
  Filled 2016-01-27: qty 100

## 2016-01-27 MED ORDER — ONDANSETRON HCL 4 MG PO TABS: 4.0000 mg | ORAL_TABLET | Freq: Four times a day (QID) | ORAL | Status: DC | PRN

## 2016-01-27 MED ORDER — LIDOCAINE HCL (CARDIAC) 20 MG/ML IV SOLN
INTRAVENOUS | Status: DC | PRN
  Administered 2016-01-27: 50 mg via INTRATRACHEAL

## 2016-01-27 MED ORDER — SODIUM CHLORIDE 0.9 % IR SOLN
Status: DC | PRN
  Administered 2016-01-27: 2000 mL

## 2016-01-27 MED ORDER — GABAPENTIN 100 MG PO CAPS
100.0000 mg | ORAL_CAPSULE | Freq: Every day | ORAL | Status: DC
  Administered 2016-01-27 – 2016-01-31 (×5): 100 mg via ORAL
  Filled 2016-01-27 (×5): qty 1

## 2016-01-27 MED ORDER — HYDROMORPHONE HCL 1 MG/ML IJ SOLN
INTRAMUSCULAR | Status: AC
  Filled 2016-01-27: qty 1

## 2016-01-27 MED ORDER — ACETAMINOPHEN 500 MG PO TABS
1000.0000 mg | ORAL_TABLET | Freq: Four times a day (QID) | ORAL | Status: AC
  Administered 2016-01-28 (×2): 1000 mg via ORAL
  Filled 2016-01-27 (×5): qty 2

## 2016-01-27 MED ORDER — ROCURONIUM BROMIDE 100 MG/10ML IV SOLN
INTRAVENOUS | Status: AC
  Filled 2016-01-27: qty 1

## 2016-01-27 MED ORDER — POTASSIUM CHLORIDE CRYS ER 20 MEQ PO TBCR
20.0000 meq | EXTENDED_RELEASE_TABLET | Freq: Every day | ORAL | Status: DC
  Administered 2016-01-28: 20 meq via ORAL
  Filled 2016-01-27 (×2): qty 1

## 2016-01-27 MED ORDER — FENTANYL CITRATE (PF) 250 MCG/5ML IJ SOLN
INTRAMUSCULAR | Status: DC | PRN
  Administered 2016-01-27: 50 ug via INTRAVENOUS
  Administered 2016-01-27 (×2): 25 ug via INTRAVENOUS
  Administered 2016-01-27 (×3): 50 ug via INTRAVENOUS

## 2016-01-27 MED ORDER — ALVIMOPAN 12 MG PO CAPS
12.0000 mg | ORAL_CAPSULE | Freq: Two times a day (BID) | ORAL | Status: DC
  Administered 2016-01-28 (×2): 12 mg via ORAL
  Filled 2016-01-27 (×4): qty 1

## 2016-01-27 MED ORDER — SCOPOLAMINE 1 MG/3DAYS TD PT72
1.0000 | MEDICATED_PATCH | Freq: Once | TRANSDERMAL | Status: DC
  Administered 2016-01-27: 1.5 mg via TRANSDERMAL

## 2016-01-27 MED ORDER — DEXAMETHASONE SODIUM PHOSPHATE 10 MG/ML IJ SOLN
INTRAMUSCULAR | Status: DC | PRN
  Administered 2016-01-27: 10 mg via INTRAVENOUS

## 2016-01-27 MED ORDER — DEXAMETHASONE SODIUM PHOSPHATE 10 MG/ML IJ SOLN
INTRAMUSCULAR | Status: AC
  Filled 2016-01-27: qty 1

## 2016-01-27 MED ORDER — FUROSEMIDE 40 MG PO TABS
40.0000 mg | ORAL_TABLET | Freq: Two times a day (BID) | ORAL | Status: DC
  Administered 2016-01-27 – 2016-02-01 (×10): 40 mg via ORAL
  Filled 2016-01-27 (×10): qty 1

## 2016-01-27 MED ORDER — PROMETHAZINE HCL 25 MG/ML IJ SOLN: 6.2500 mg | INTRAMUSCULAR | Status: DC | PRN

## 2016-01-27 MED ORDER — LABETALOL HCL 5 MG/ML IV SOLN
INTRAVENOUS | Status: AC
  Filled 2016-01-27: qty 4

## 2016-01-27 MED ORDER — BUPIVACAINE-EPINEPHRINE (PF) 0.25% -1:200000 IJ SOLN
INTRAMUSCULAR | Status: AC
Start: 2016-01-27 — End: 2016-01-27
  Filled 2016-01-27: qty 30

## 2016-01-27 MED ORDER — EPHEDRINE SULFATE 50 MG/ML IJ SOLN
INTRAMUSCULAR | Status: DC | PRN
  Administered 2016-01-27: 5 mg via INTRAVENOUS

## 2016-01-27 MED ORDER — CEFOTETAN DISODIUM-DEXTROSE 2-2.08 GM-% IV SOLR
INTRAVENOUS | Status: AC
  Filled 2016-01-27: qty 50

## 2016-01-27 MED ORDER — DEXTROSE 5 % IV SOLN
2.0000 g | INTRAVENOUS | Status: AC
  Administered 2016-01-27: 2 g via INTRAVENOUS

## 2016-01-27 MED ORDER — DIPHENHYDRAMINE HCL 12.5 MG/5ML PO ELIX: 12.5000 mg | ORAL_SOLUTION | Freq: Four times a day (QID) | ORAL | Status: DC | PRN

## 2016-01-27 MED ORDER — BUPIVACAINE LIPOSOME 1.3 % IJ SUSP
20.0000 mL | Freq: Once | INTRAMUSCULAR | Status: AC
Start: 2016-01-27 — End: 2016-01-27
  Administered 2016-01-27: 20 mL
  Filled 2016-01-27: qty 20

## 2016-01-27 MED ORDER — ROCURONIUM BROMIDE 100 MG/10ML IV SOLN
INTRAVENOUS | Status: DC | PRN
Start: 2016-01-27 — End: 2016-01-27
  Administered 2016-01-27: 5 mg via INTRAVENOUS
  Administered 2016-01-27: 10 mg via INTRAVENOUS
  Administered 2016-01-27: 20 mg via INTRAVENOUS
  Administered 2016-01-27: 40 mg via INTRAVENOUS

## 2016-01-27 MED ORDER — SODIUM CHLORIDE 0.9 % IJ SOLN
INTRAMUSCULAR | Status: AC
  Filled 2016-01-27: qty 50

## 2016-01-27 MED ORDER — NAPROXEN 250 MG PO TABS
250.0000 mg | ORAL_TABLET | Freq: Two times a day (BID) | ORAL | Status: DC | PRN
  Administered 2016-01-27 – 2016-01-31 (×3): 250 mg via ORAL
  Filled 2016-01-27 (×5): qty 1

## 2016-01-27 MED ORDER — MORPHINE SULFATE (PF) 2 MG/ML IV SOLN
2.0000 mg | INTRAVENOUS | Status: DC | PRN
  Administered 2016-01-27 – 2016-01-29 (×4): 2 mg via INTRAVENOUS
  Filled 2016-01-27 (×5): qty 1

## 2016-01-27 MED ORDER — LORAZEPAM 1 MG PO TABS
1.0000 mg | ORAL_TABLET | Freq: Four times a day (QID) | ORAL | Status: DC | PRN
  Administered 2016-01-29 – 2016-01-30 (×2): 1 mg via ORAL
  Filled 2016-01-27 (×2): qty 1

## 2016-01-27 MED ORDER — PANTOPRAZOLE SODIUM 40 MG PO TBEC

## 2016-01-27 MED ORDER — LACTATED RINGERS IV SOLN
INTRAVENOUS | Status: DC | PRN
  Administered 2016-01-27 (×2): via INTRAVENOUS

## 2016-01-27 MED ORDER — ONDANSETRON HCL 4 MG/2ML IJ SOLN
INTRAMUSCULAR | Status: AC
Start: 2016-01-27 — End: 2016-01-27
  Filled 2016-01-27: qty 2

## 2016-01-27 MED ORDER — LACTATED RINGERS IV SOLN
INTRAVENOUS | Status: DC
  Administered 2016-01-27: 11:00:00 via INTRAVENOUS

## 2016-01-27 MED ORDER — KCL IN DEXTROSE-NACL 20-5-0.45 MEQ/L-%-% IV SOLN
INTRAVENOUS | Status: DC
  Administered 2016-01-27 – 2016-01-28 (×2): via INTRAVENOUS
  Filled 2016-01-27 (×3): qty 1000

## 2016-01-27 MED ORDER — INDOCYANINE GREEN 25 MG IV SOLR
INTRAVENOUS | Status: DC | PRN
  Administered 2016-01-27: 7.5 mg via INTRAVENOUS

## 2016-01-27 MED ORDER — FENTANYL CITRATE (PF) 250 MCG/5ML IJ SOLN
INTRAMUSCULAR | Status: AC
  Filled 2016-01-27: qty 5

## 2016-01-27 MED ORDER — PROPOFOL 10 MG/ML IV BOLUS
INTRAVENOUS | Status: AC
  Filled 2016-01-27: qty 20

## 2016-01-27 MED ORDER — BUPIVACAINE-EPINEPHRINE 0.25% -1:200000 IJ SOLN
INTRAMUSCULAR | Status: DC | PRN
  Administered 2016-01-27: 30 mL

## 2016-01-27 MED ORDER — ENOXAPARIN SODIUM 40 MG/0.4ML ~~LOC~~ SOLN
40.0000 mg | SUBCUTANEOUS | Status: DC
  Administered 2016-01-28 – 2016-02-01 (×5): 40 mg via SUBCUTANEOUS
  Filled 2016-01-27 (×5): qty 0.4

## 2016-01-27 SURGICAL SUPPLY — 90 items
BLADE EXTENDED COATED 6.5IN (ELECTRODE) IMPLANT
CANNULA REDUC XI 12-8 STAPL (CANNULA) ×1
CANNULA REDUC XI 12-8MM STAPL (CANNULA) ×1
CANNULA REDUCER 12-8 DVNC XI (CANNULA) ×1 IMPLANT
CELLS DAT CNTRL 66122 CELL SVR (MISCELLANEOUS) IMPLANT
CLIP LIGATING HEM O LOK PURPLE (MISCELLANEOUS) IMPLANT
CLIP LIGATING HEMOLOK MED (MISCELLANEOUS) IMPLANT
COUNTER NEEDLE 20 DBL MAG RED (NEEDLE) ×3 IMPLANT
COVER MAYO STAND STRL (DRAPES) ×6 IMPLANT
COVER TIP SHEARS 8 DVNC (MISCELLANEOUS) ×1 IMPLANT
COVER TIP SHEARS 8MM DA VINCI (MISCELLANEOUS) ×2
DECANTER SPIKE VIAL GLASS SM (MISCELLANEOUS) ×3 IMPLANT
DEVICE TROCAR PUNCTURE CLOSURE (ENDOMECHANICALS) IMPLANT
DRAPE ARM DVNC X/XI (DISPOSABLE) ×4 IMPLANT
DRAPE COLUMN DVNC XI (DISPOSABLE) ×1 IMPLANT
DRAPE DA VINCI XI ARM (DISPOSABLE) ×8
DRAPE DA VINCI XI COLUMN (DISPOSABLE) ×2
DRAPE SURG IRRIG POUCH 19X23 (DRAPES) ×3 IMPLANT
DRSG OPSITE POSTOP 4X10 (GAUZE/BANDAGES/DRESSINGS) IMPLANT
DRSG OPSITE POSTOP 4X6 (GAUZE/BANDAGES/DRESSINGS) ×2 IMPLANT
DRSG OPSITE POSTOP 4X8 (GAUZE/BANDAGES/DRESSINGS) IMPLANT
ELECT PENCIL ROCKER SW 15FT (MISCELLANEOUS) ×6 IMPLANT
ELECT REM PT RETURN 15FT ADLT (MISCELLANEOUS) ×3 IMPLANT
ENDOLOOP SUT PDS II  0 18 (SUTURE)
ENDOLOOP SUT PDS II 0 18 (SUTURE) IMPLANT
EVACUATOR SILICONE 100CC (DRAIN) IMPLANT
GAUZE SPONGE 4X4 12PLY STRL (GAUZE/BANDAGES/DRESSINGS) IMPLANT
GLOVE BIO SURGEON STRL SZ 6.5 (GLOVE) ×6 IMPLANT
GLOVE BIO SURGEONS STRL SZ 6.5 (GLOVE) ×3
GLOVE BIOGEL PI IND STRL 7.0 (GLOVE) ×3 IMPLANT
GLOVE BIOGEL PI INDICATOR 7.0 (GLOVE) ×6
GOWN STRL REUS W/TWL 2XL LVL3 (GOWN DISPOSABLE) ×9 IMPLANT
GOWN STRL REUS W/TWL XL LVL3 (GOWN DISPOSABLE) ×12 IMPLANT
HOLDER FOLEY CATH W/STRAP (MISCELLANEOUS) ×3 IMPLANT
IRRIG SUCT STRYKERFLOW 2 WTIP (MISCELLANEOUS) ×3
IRRIGATION SUCT STRKRFLW 2 WTP (MISCELLANEOUS) ×1 IMPLANT
LEGGING LITHOTOMY PAIR STRL (DRAPES) ×3 IMPLANT
LIQUID BAND (GAUZE/BANDAGES/DRESSINGS) ×2 IMPLANT
NEEDLE INSUFFLATION 14GA 120MM (NEEDLE) ×3 IMPLANT
PACK CARDIOVASCULAR III (CUSTOM PROCEDURE TRAY) ×3 IMPLANT
PACK COLON (CUSTOM PROCEDURE TRAY) ×3 IMPLANT
PORT LAP GEL ALEXIS MED 5-9CM (MISCELLANEOUS) ×2 IMPLANT
RELOAD STAPLE 45 BLU REG DVNC (STAPLE) IMPLANT
RETRACTOR WND ALEXIS 18 MED (MISCELLANEOUS) IMPLANT
RTRCTR WOUND ALEXIS 18CM MED (MISCELLANEOUS)
SCISSORS LAP 5X35 DISP (ENDOMECHANICALS) ×3 IMPLANT
SEAL CANN UNIV 5-8 DVNC XI (MISCELLANEOUS) ×3 IMPLANT
SEAL XI 5MM-8MM UNIVERSAL (MISCELLANEOUS) ×6
SEALER VESSEL DA VINCI XI (MISCELLANEOUS) ×2
SEALER VESSEL EXT DVNC XI (MISCELLANEOUS) ×1 IMPLANT
SET BI-LUMEN FLTR TB AIRSEAL (TUBING) ×3 IMPLANT
SLEEVE ADV FIXATION 5X100MM (TROCAR) IMPLANT
SOLUTION ELECTROLUBE (MISCELLANEOUS) ×3 IMPLANT
SPONGE DRAIN TRACH 4X4 STRL 2S (GAUZE/BANDAGES/DRESSINGS) ×2 IMPLANT
STAPLER 45 BLU RELOAD XI (STAPLE) ×2 IMPLANT
STAPLER 45 BLUE RELOAD XI (STAPLE) ×4
STAPLER 45 GREEN RELOAD XI (STAPLE)
STAPLER 45 GRN RELOAD XI (STAPLE) IMPLANT
STAPLER CANNULA SEAL DVNC XI (STAPLE) ×1 IMPLANT
STAPLER CANNULA SEAL XI (STAPLE) ×2
STAPLER SHEATH (SHEATH) ×2
STAPLER SHEATH ENDOWRIST DVNC (SHEATH) ×1 IMPLANT
STAPLER VISISTAT 35W (STAPLE) ×3 IMPLANT
SUT NOVA NAB GS-21 0 18 T12 DT (SUTURE) ×6 IMPLANT
SUT PDS AB 1 CTX 36 (SUTURE) IMPLANT
SUT PDS AB 1 TP1 96 (SUTURE) IMPLANT
SUT PROLENE 2 0 KS (SUTURE) ×3 IMPLANT
SUT SILK 2 0 (SUTURE) ×3
SUT SILK 2 0 SH CR/8 (SUTURE) ×3 IMPLANT
SUT SILK 2-0 18XBRD TIE 12 (SUTURE) ×1 IMPLANT
SUT SILK 3 0 (SUTURE) ×3
SUT SILK 3 0 SH CR/8 (SUTURE) ×3 IMPLANT
SUT SILK 3-0 18XBRD TIE 12 (SUTURE) ×1 IMPLANT
SUT V-LOC BARB 180 2/0GR6 GS22 (SUTURE)
SUT VIC AB 2-0 SH 18 (SUTURE) ×3 IMPLANT
SUT VIC AB 2-0 SH 27 (SUTURE) ×3
SUT VIC AB 2-0 SH 27X BRD (SUTURE) ×1 IMPLANT
SUT VIC AB 3-0 SH 18 (SUTURE) IMPLANT
SUT VIC AB 4-0 PS2 27 (SUTURE) ×6 IMPLANT
SUTURE V-LC BRB 180 2/0GR6GS22 (SUTURE) IMPLANT
SYRINGE 10CC LL (SYRINGE) ×3 IMPLANT
SYS LAPSCP GELPORT 120MM (MISCELLANEOUS)
SYSTEM LAPSCP GELPORT 120MM (MISCELLANEOUS) IMPLANT
TOWEL OR 17X26 10 PK STRL BLUE (TOWEL DISPOSABLE) ×3 IMPLANT
TOWEL OR NON WOVEN STRL DISP B (DISPOSABLE) ×3 IMPLANT
TRAY FOLEY W/METER SILVER 14FR (SET/KITS/TRAYS/PACK) ×3 IMPLANT
TRAY FOLEY W/METER SILVER 16FR (SET/KITS/TRAYS/PACK) ×1 IMPLANT
TROCAR ADV FIXATION 5X100MM (TROCAR) ×3 IMPLANT
TUBING CONNECTING 10 (TUBING) IMPLANT
TUBING CONNECTING 10' (TUBING)

## 2016-01-27 NOTE — Progress Notes (Signed)
Hearing aids in both ears upon arrival to floor, Adapter box placed on shelf by cabinet over sink.

## 2016-01-27 NOTE — Op Note (Signed)
01/27/2016  2:34 PM  PATIENT:  [name]  82 y.o. female  Patient Care Team: [name], MD as PCP - General (Internal Medicine) Tania Ade, RN as Registered Nurse  PRE-OPERATIVE DIAGNOSIS:  rectal cancer     POST-OPERATIVE DIAGNOSIS:  Proximal rectal cancer   PROCEDURE:  XI ROBOTIC ASSISTED LOW ANTERIOR RESECTION   Surgeon(s): Leighton Ruff, MD Ralene Ok, MD  ASSISTANT: Dr Rosendo Gros   ANESTHESIA:   local and general  EBL: 130ml  Total I/O In: 1500 [I.V.:1500] Out: 400 [Urine:300; Blood:100]  Delay start of Pharmacological VTE agent (>24hrs) due to surgical blood loss or risk of bleeding:  no  DRAINS: (69F) Jackson-Pratt drain(s) with closed bulb suction in the pelvis   SPECIMEN:  Source of Specimen:  Rectosigmoid  DISPOSITION OF SPECIMEN:  PATHOLOGY  COUNTS:  YES  PLAN OF CARE: Admit to inpatient   PATIENT DISPOSITION:  PACU - hemodynamically stable.  INDICATION:    82 y.o. F with rectal cancer.  I recommended segmental resection:  The anatomy & physiology of the digestive tract was discussed.  The pathophysiology was discussed.  Natural history risks without surgery was discussed.   I worked to give an overview of the disease and the frequent need to have multispecialty involvement.  I feel the risks of no intervention will lead to serious problems that outweigh the operative risks; therefore, I recommended a partial colectomy to remove the pathology.  Laparoscopic & open techniques were discussed.   Risks such as bleeding, infection, abscess, leak, reoperation, possible ostomy, hernia, heart attack, death, and other risks were discussed.  I noted a good likelihood this will help address the problem.   Goals of post-operative recovery were discussed as well.    The patient expressed understanding & wished to proceed with surgery.  OR FINDINGS:   Patient had a mass in her proximal rectum.  No obvious metastatic disease on visceral parietal  peritoneum or liver.  The anastomosis rests 10 cm from the anal verge by rigid proctoscopy.  DESCRIPTION:   Informed consent was confirmed.  The patient underwent general anaesthesia without difficulty.  The patient was positioned appropriately.  VTE prevention in place.  [name] abdomen was clipped, prepped, & draped in a sterile fashion.  Surgical timeout confirmed our plan.  The patient was positioned in reverse Trendelenburg.  Abdominal entry was gained using a Varies needle in the LUQ.  Entry was clean.  I induced carbon dioxide insufflation.  I inserted an 63mm robotic port.  Camera inspection revealed no injury.  Extra ports were carefully placed under direct laparoscopic visualization.   I reflected the greater omentum and the upper abdomen the small bowel in the upper abdomen. The robot was docked to the patient's left side and instruments were introduced under direct visualization.  I scored the base of peritoneum of the right side of the mesentery of the left colon from the ligament of Treitz to the peritoneal reflection of the mid rectum.  The patient had tattoo noted in the proximal rectum.  I elevated the sigmoid mesentery and enetered into the retro-mesenteric plane. We were able to identify the left ureter and gonadal vessels. We kept those posterior within the retroperitoneum and elevated the left colon mesentery off that. I did isolated IMA pedicle but did not ligate it yet.  I continued distally and got into the avascular plane posterior to the mesorectum. This allowed me to help mobilize the rectum as well by freeing the mesorectum off  the sacrum.  I mobilized the peritoneal coverings towards the peritoneal reflection on both the right and left sides of the rectum.  I could see the right and left ureters and stayed away from them.    I skeletonized the inferior mesenteric artery pedicle.  I went down to its takeoff from the aorta. After confirming the left ureter was out of the  way, I went ahead and ligated the inferior mesenteric artery pedicle with bipolar robotic vessel sealer ~2cm above its takeoff from the aorta.   The remaining mesentery was taken with the vessel sealer as well.  We ensured hemostasis. I skeletonized the mesorectum at the junction at the proximal rectum using blunt dissection & bipolar vessel sealer.  I mobilized the left colon in a lateral to medial fashion off the line of Toldt up towards the splenic flexure to ensure good mobilization of the left colon to reach into the pelvis.  I injected firefly and confirmed good blood supply at both ends of the remaining colon and rectum.  I then enlarged the 12 mm port site and placed an [place] wound protector.  The colon was removed and transected.  A 2-0 prolene purse string was made around a 70mm EEA anvil.  I removed a diverticulum and tied this around the anvil as well to keep it out of the staple line.  An anastomosis was created without difficulty.  There was no leak.  It rests ~10cm from anal verge.  A 40F blake drain was placed in the pelvis and brought out through the RLQ port site.  It was secured to the skin.  The ports were removed.  We switched to clean drapes, instruments and gowns.    The extraction site was closed with 0 Novofil interrupted sutures.  The subcutaneous tissue was closed over this with 2-0 Vicryl stitches.  The skin of all sites was closed with 4-0 Vicryl.  Sterile dressings were applied.  The patient was awakened from anesthesia and sent to the PACU in stable condition.  All counts were correct.

## 2016-01-27 NOTE — Interval H&P Note (Signed)
History and Physical Interval Note:  01/27/2016 11:08 AM  [name]  has presented today for surgery, with the diagnosis of rectal cancer     The various methods of treatment have been discussed with the patient and family. After consideration of risks, benefits and other options for treatment, the patient has consented to  Procedure(s): XI ROBOTIC ASSISTED LOWER ANTERIOR RESECTION (N/A) as a surgical intervention .  [name] history has been reviewed, patient examined, no change in status, stable for surgery.  I have reviewed the patient's chart and labs.  Questions were answered to the patient's satisfaction.     Rosario Adie, MD  Colorectal and [facility]

## 2016-01-27 NOTE — Transfer of Care (Signed)
Immediate Anesthesia Transfer of Care Note  Patient: [name]  Procedure(s) Performed: Procedure(s): XI ROBOTIC ASSISTED LOW ANTERIOR RESECTION (N/A)  Patient Location: PACU  Anesthesia Type:General  Level of Consciousness: awake, alert  and patient cooperative  Airway & Oxygen Therapy: Patient Spontanous Breathing and Patient connected to face mask oxygen  Post-op Assessment: Report given to RN and Post -op Vital signs reviewed and stable  Post vital signs: Reviewed and stable  Last Vitals:  Vitals:   01/27/16 0937  BP: 128/88  Pulse: 75  Resp: 16  Temp: 36.8 C    Last Pain:  Vitals:   01/27/16 0937  TempSrc: Oral         Complications: No apparent anesthesia complications

## 2016-01-27 NOTE — H&P (View-Only) (Signed)
History of Present Illness Leighton Ruff MD; 0000000 9:54 AM) The patient is a 82 year old female who presents with colorectal cancer. 82 year old female who presents to the office with a newly diagnosed rectal cancer. She was noted to have rectal bleeding. Colonoscopy revealed a mass in the mid to proximal rectum. Biopsies confirmed adenocarcinoma. CEA was normal. CT scan show no signs of metastatic disease. Ultrasound shows a T2 N1 lesion.   Other Problems Elbert Ewings, CMA; 01/04/2016 9:19 AM) Anxiety Disorder Arthritis Breast Cancer Colon Cancer Gastroesophageal Reflux Disease Heart murmur High blood pressure Thyroid Disease  Past Surgical History Elbert Ewings, CMA; 01/04/2016 9:19 AM) Breast Biopsy Right. Breast Reconstruction Right. Knee Surgery Right. Mammoplasty; Reduction Right. Mastectomy Right. Spinal Surgery - Lower Back Spinal Surgery - Neck  Diagnostic Studies History Elbert Ewings, CMA; 01/04/2016 9:19 AM) Mammogram 1-3 years ago  Allergies Elbert Ewings, CMA; 01/04/2016 9:20 AM) Codeine Sulfate *ANALGESICS - OPIOID* Pregabalin *CHEMICALS*  Medication History Elbert Ewings, CMA; 01/04/2016 9:23 AM) Dulcolax (5MG  Tablet DR, Oral) Active. NexIUM (40MG  Capsule DR, Oral) Active. Furosemide (20MG  Tablet, Oral) Active. Gabapentin (100MG  Capsule, Oral) Active. Hydrocodone-Acetaminophen (5-325MG  Tablet, Oral) Active. Levothyroxine Sodium (100MCG Tablet, Oral) Active. LORazepam (1MG  Tablet, Oral) Active. Multi-Day Vitamins (Oral) Active. Naproxen Sodium (220MG  Capsule, Oral) Active. Potassium Chloride ER (20MEQ Tablet ER, Oral) Active. Zantac (150MG  Tablet, Oral) Active. Voltaren (1% Gel, Transdermal) Active. Medications Reconciled  Social History Elbert Ewings, [place]; 01/04/2016 9:19 AM) Caffeine use Carbonated beverages, Coffee. Tobacco use Never smoker.  Family History Elbert Ewings, [place]; 01/04/2016 9:19 AM) Alcohol Abuse  Father. Prostate Cancer Father.  Pregnancy / Birth History Elbert Ewings, [place]; 01/04/2016 9:19 AM) Age of menopause 39-60 Gravida 2 Maternal age 86-25 Para 2     Review of Systems Elbert Ewings CMA; 01/04/2016 9:19 AM) Eldridge Dace Present- Wears glasses/contact lenses. Not Present- Earache, Hearing Loss, Hoarseness, Nose Bleed, Oral Ulcers, Ringing in the Ears, Seasonal Allergies, Sinus Pain, Sore Throat, Visual Disturbances and Yellow Eyes. Cardiovascular Present- Swelling of Extremities. Not Present- Chest Pain, Difficulty Breathing Lying Down, Leg Cramps, Palpitations, Rapid Heart Rate and Shortness of Breath. Gastrointestinal Present- Abdominal Pain, Bloody Stool and Change in Bowel Habits. Not Present- Bloating, Chronic diarrhea, Constipation, Difficulty Swallowing, Excessive gas, Gets full quickly at meals, Hemorrhoids, Indigestion, Nausea, Rectal Pain and Vomiting. Musculoskeletal Present- Back Pain and Joint Pain. Not Present- Joint Stiffness, Muscle Pain, Muscle Weakness and Swelling of Extremities. Neurological Present- Weakness. Not Present- Decreased Memory, Fainting, Headaches, Numbness, Seizures, Tingling, Tremor and Trouble walking. Psychiatric Present- Anxiety. Not Present- Bipolar, Change in Sleep Pattern, Depression, Fearful and Frequent crying.  Vitals Elbert Ewings CMA; 01/04/2016 9:23 AM) 01/04/2016 9:23 AM Weight: 170 lb Height: 66in Body Surface Area: 1.87 m Body Mass Index: 27.44 kg/m  Temp.: 98.49F  Pulse: 68 (Regular)  BP: 122/74 (Sitting, Left Arm, Standard)      Physical Exam Leighton Ruff MD; 0000000 10:46 AM)  General Mental Status-Alert. General Appearance-Not in acute distress. Build & Nutrition-Well nourished. Posture-Normal posture. Gait-Normal.  Head and Neck Head-normocephalic, atraumatic with no lesions or palpable masses. Trachea-midline. Thyroid Gland Characteristics - normal size and consistency and no palpable  nodules.  Chest and Lung Exam Chest and lung exam reveals -on auscultation, normal breath sounds, no adventitious sounds and normal vocal resonance.  Cardiovascular Cardiovascular examination reveals -normal heart sounds, regular rate and rhythm with no murmurs and femoral artery auscultation bilaterally reveals normal pulses, no bruits, no thrills.  Abdomen Inspection Inspection of the abdomen reveals - No Hernias. Palpation/Percussion  Palpation and Percussion of the abdomen reveal - Soft, Non Tender, No Rigidity (guarding), No hepatosplenomegaly and No Palpable abdominal masses.  Rectal Anorectal Exam External - normal external exam. Internal - normal internal exam. Note: no mass palpated.  Neurologic Neurologic evaluation reveals -alert and oriented x 3 with no impairment of recent or remote memory, normal attention span and ability to concentrate, normal sensation and normal coordination.  Musculoskeletal Normal Exam - Bilateral-Upper Extremity Strength Normal and Lower Extremity Strength Normal.    Assessment & Plan Leighton Ruff MD; 0000000 9:58 AM)  RECTAL CANCER (C20) Impression: 82 year old female with most likely a proximal rectal cancer. This has been tattooed. Ultrasound shows a T2 N1 lesion. I will discuss this with Dr. Lisbeth Renshaw.  We discussed the risks and benefits of radiation, including the increased survival rate with neoadjuvant ChemoRT.  It appears unlikely that the patient will benefit much from radiation, but I will confirm this with the Radiation Oncologist. She is not really interested in radiation therapy due to concern for side effects. I have recommended a robotic low anterior resection with primary anastomosis. We will request medical clearance from [name] prior to surgery.    The surgery and anatomy were described to the patient as well as the risks of surgery and the possible complications. These include: Bleeding, deep abdominal infections and  possible wound complications such as hernia and infection, damage to adjacent structures, leak of surgical connections, which can lead to other surgeries and possibly an ostomy, possible need for other procedures, such as abscess drains in radiology, possible prolonged hospital stay, possible diarrhea from removal of part of the colon, possible constipation from narcotics, possible bowel, bladder or sexual dysfunction if having rectal surgery, prolonged fatigue/weakness or appetite loss, possible early recurrence of of disease, possible complications of their medical problems such as heart disease or arrhythmias or lung problems, death (less than 1%). I believe the patient understands and wishes to proceed with the surgery.

## 2016-01-27 NOTE — Anesthesia Postprocedure Evaluation (Signed)
Anesthesia Post Note  Patient: [name]  Procedure(s) Performed: Procedure(s) (LRB): XI ROBOTIC ASSISTED LOW ANTERIOR RESECTION (N/A)  Patient location during evaluation: PACU Anesthesia Type: General Level of consciousness: sedated Pain management: pain level controlled Vital Signs Assessment: post-procedure vital signs reviewed and stable Respiratory status: spontaneous breathing and respiratory function stable Cardiovascular status: stable Anesthetic complications: no    Last Vitals:  Vitals:   01/27/16 1530 01/27/16 1545  BP: (!) 180/73 (!) 176/75  Pulse: 73 76  Resp: 14 15  Temp: 36.4 C     Last Pain:  Vitals:   01/27/16 1545  TempSrc:   PainSc: 3                  Alphonso Gregson DANIEL
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
## 2016-01-27 NOTE — Anesthesia Procedure Notes (Signed)
Procedure Name: Intubation Date/Time: 01/27/2016 12:03 PM Performed by: Dione Booze Pre-anesthesia Checklist: Emergency Drugs available, Suction available, Patient being monitored and Patient identified Patient Re-evaluated:Patient Re-evaluated prior to inductionOxygen Delivery Method: Circle system utilized Preoxygenation: Pre-oxygenation with 100% oxygen Intubation Type: IV induction Ventilation: Mask ventilation without difficulty Laryngoscope Size: Mac and 4 Grade View: Grade I Tube type: Oral Tube size: 7.5 mm Number of attempts: 1 Airway Equipment and Method: Stylet Placement Confirmation: ETT inserted through vocal cords under direct vision,  positive ETCO2 and breath sounds checked- equal and bilateral Secured at: 21 cm Tube secured with: Tape Dental Injury: Teeth and Oropharynx as per pre-operative assessment

## 2016-01-28 LAB — CBC
HCT: 37.9 % (ref 36.0–46.0)
HEMOGLOBIN: 12.3 g/dL (ref 12.0–15.0)
MCH: 28.7 pg (ref 26.0–34.0)
MCHC: 32.5 g/dL (ref 30.0–36.0)
MCV: 88.3 fL (ref 78.0–100.0)
PLATELETS: 182 10*3/uL (ref 150–400)
RBC: 4.29 MIL/uL (ref 3.87–5.11)
RDW: 14.5 % (ref 11.5–15.5)
WBC: 12.9 10*3/uL — AB (ref 4.0–10.5)

## 2016-01-28 LAB — BASIC METABOLIC PANEL
ANION GAP: 5 (ref 5–15)
BUN: 10 mg/dL (ref 6–20)
CALCIUM: 8.8 mg/dL — AB (ref 8.9–10.3)
CO2: 23 mmol/L (ref 22–32)
CREATININE: 1.06 mg/dL — AB (ref 0.44–1.00)
Chloride: 109 mmol/L (ref 101–111)
GFR calc non Af Amer: 45 mL/min — ABNORMAL LOW (ref >60)
GFR, EST AFRICAN AMERICAN: 53 mL/min — AB (ref >60)
Glucose, Bld: 245 mg/dL — ABNORMAL HIGH (ref 65–99)
Potassium: 4.4 mmol/L (ref 3.5–5.1)
SODIUM: 137 mmol/L (ref 135–145)

## 2016-01-28 NOTE — Evaluation (Signed)
Physical Therapy Evaluation Patient Details Name: [name] MRN: [id number] DOB: [birth year] Today's Date: 01/28/2016   History of Present Illness  82 yo female admitted 01/27/16 for rectal cancer. S/PXI ROBOTIC ASSISTED LOW ANTERIOR RESECTION  Clinical Impression  The patient was able to ambulate with RW x 120'. The patient expresses desire to return to her apartment. Uncertain of caregiver availability. She reports that she is trying to get a Neurosurgeon who assists others at  [facility].  Pt admitted with above diagnosis. Pt currently with functional limitations due to the deficits listed below (see PT Problem List).  Pt will benefit from skilled PT to increase their independence and safety with mobility to allow discharge to the venue listed below.       Follow Up Recommendations Home health PT;SNF;Supervision/Assistance - 24 hour (The patient wants to return to her apartment. She did not confirm if her daughter is available for a few days.)    Equipment Recommendations  None recommended by PT    Recommendations for Other Services       Precautions / Restrictions Precautions Precaution Comments: R drain      Mobility  Bed Mobility Overal bed mobility: Needs Assistance Bed Mobility: Rolling;Sidelying to Sit;Sit to Sidelying Rolling: Supervision Sidelying to sit: Supervision     Sit to sidelying: Supervision General bed mobility comments: cues to roll, attempt to go from sidelying to protect abdomen  Transfers Overall transfer level: Needs assistance Equipment used: Rolling walker (2 wheeled) Transfers: Sit to/from Stand Sit to Stand: Min guard            Ambulation/Gait Ambulation/Gait assistance: Min assist Ambulation Distance (Feet): 120 Feet Assistive device: Rolling walker (2 wheeled) Gait Pattern/deviations: Step-through pattern     General Gait Details: cues for posture, RW was not rolling  well, will bring rollator next  visit.  Stairs            Wheelchair Mobility    Modified Rankin (Stroke Patients Only)       Balance Overall balance assessment: Needs assistance Sitting-balance support: Feet supported;No upper extremity supported Sitting balance-Leahy Scale: Good     Standing balance support: No upper extremity supported;During functional activity Standing balance-Leahy Scale: Fair                               Pertinent Vitals/Pain Pain Assessment: 0-10 Pain Score: 7  Pain Location: abdomen Pain Descriptors / Indicators: Guarding;Aching;Sore Pain Intervention(s): Limited activity within patient's tolerance;Premedicated before session;Monitored during session    Home Living   Living Arrangements: Alone Available Help at Discharge: Available 24 hours/day Type of Home: Independent living facility       Home Layout: One level Home Equipment: Walker - 4 wheels Additional Comments: walks to meals without the RW     Prior Function Level of Independence: Independent               Hand Dominance        Extremity/Trunk Assessment   Upper Extremity Assessment: Overall WFL for tasks assessed           Lower Extremity Assessment: Generalized weakness         Communication   Communication: No difficulties  Cognition Arousal/Alertness: Awake/alert Behavior During Therapy: WFL for tasks assessed/performed Overall Cognitive Status: Within Functional Limits for tasks assessed  General Comments      Exercises        Assessment/Plan    PT Assessment Patient needs continued PT services  PT Diagnosis Difficulty walking;Generalized weakness;Acute pain   PT Problem List Decreased strength;Decreased activity tolerance;Decreased balance;Decreased mobility;Decreased knowledge of precautions;Decreased safety awareness;Decreased knowledge of use of DME  PT Treatment Interventions DME instruction;Gait training;Functional  mobility training;Therapeutic activities;Therapeutic exercise;Patient/family education   PT Goals (Current goals can be found in the Care Plan section) Acute Rehab PT Goals Patient Stated Goal: I want to return to my apartment at [facility] PT Goal Formulation: With patient Time For Goal Achievement: 02/11/16 Potential to Achieve Goals: Good    Frequency Min 3X/week   Barriers to discharge Decreased caregiver support      Co-evaluation               End of Session   Activity Tolerance: Patient limited by pain Patient left: in bed;with call bell/phone within reach;with bed alarm set Nurse Communication: Mobility status         Time: [id number] PT Time Calculation (min) (ACUTE ONLY): 16 min   Charges:   PT Evaluation $PT Eval Low Complexity: 1 Procedure     PT G CodesClaretha Cooper 01/28/2016, 4:32 PM Tresa Endo PT [phone]

## 2016-01-28 NOTE — Progress Notes (Signed)
PT Cancellation Note  Patient Details Name: [name] MRN: [id number] DOB: 06-Jun-1925   Cancelled Treatment:    Reason Eval/Treat Not Completed: Pain limiting ability to participate (just received pain medication. will check back in about 45". )   Claretha Cooper 01/28/2016, 3:16 PM Tresa Endo PT [phone]

## 2016-01-28 NOTE — Progress Notes (Signed)
1 Day Post-Op Robotic LAR  Subjective: Doing well.  No problems overnight.  No nausea  Objective: Vital signs in last 24 hours: Temp:  [97.5 F (36.4 C)-100.4 F (38 C)] 100.4 F (38 C) (07/27 0544) Pulse Rate:  [73-84] 80 (07/27 0544) Resp:  [14-18] 16 (07/27 0544) BP: (128-181)/(56-88) 135/56 (07/27 0544) SpO2:  [98 %-100 %] 100 % (07/27 0544) Weight:  [75.8 kg (167 lb)] 75.8 kg (167 lb) (07/26 1007)   Intake/Output from previous day: 07/26 0701 - 07/27 0700 In: 2650 [I.V.:2650] Out: 2020 [Urine:1750; Drains:170; Blood:100] Intake/Output this shift: No intake/output data recorded.   General appearance: alert and cooperative GI: soft, appropriately tender  Incision: no significant drainage  Lab Results:   Recent Labs  01/25/16 1430 01/28/16 0449  WBC 7.1 12.9*  HGB 13.8 12.3  HCT 43.5 37.9  PLT 219 182   BMET  Recent Labs  01/25/16 1430 01/28/16 0449  NA 138 137  K 4.9 4.4  CL 106 109  CO2 27 23  GLUCOSE 107* 245*  BUN 12 10  CREATININE 0.88 1.06*  CALCIUM 9.7 8.8*   PT/INR No results for input(s): LABPROT, INR in the last 72 hours. ABG No results for input(s): PHART, HCO3 in the last 72 hours.  Invalid input(s): PCO2, PO2  MEDS, Scheduled . acetaminophen  1,000 mg Oral Q6H  . alvimopan  12 mg Oral BID  . enoxaparin (LOVENOX) injection  40 mg Subcutaneous Q24H  . famotidine  20 mg Oral QHS  . furosemide  40 mg Oral BID  . gabapentin  100 mg Oral QHS  . isosorbide mononitrate  30 mg Oral Daily  . levothyroxine  100 mcg Oral QAC breakfast  . pantoprazole  40 mg Oral Daily  . potassium chloride SA  20 mEq Oral Daily    Studies/Results: No results found.  Assessment: s/p Procedure(s): XI ROBOTIC ASSISTED LOW ANTERIOR RESECTION Patient Active Problem List   Diagnosis Date Noted  . Rectal cancer ([place]) 12/16/2015  . Depression 03/07/2014  . Hip fracture ([place]) 02/22/2014  . Fall 02/21/2014  . Fracture of femoral neck, left ([place])  02/21/2014  . Acute pulmonary embolism ([place]) 01/04/2012  . Acute pericardial effusion 01/04/2012  . Fracture of medial malleolus, left, closed, nondisplaced -12/19/2011 12/22/2011  . Fracture of left distal radius - 12/19/2011 12/21/2011  . Fracture of lateral malleolus of right ankle - 12/19/2011 12/21/2011  . Foot fracture, left - 12/19/2011 12/21/2011  . Closed fracture of distal clavicle 12/21/2011  . PERIPHERAL VASCULAR DISEASE 03/17/2008  . GASTRITIS 11/29/2007  . ADENOCARCINOMA, BREAST 10/16/2007  . VENOUS INSUFFICIENCY 10/16/2007  . HYPOTHYROIDISM 04/23/2007  . ANXIETY 04/23/2007  . HYPERTENSION 04/23/2007  . Diastolic dysfunction, left ventricle as evidenced by moderate LVH on echocardiogram June 2013 04/23/2007  . GERD 04/23/2007    Expected post op course  Plan: Advance diet to clears Decrease MIV Ambulate in hall   LOS: 1 day     .Rosario Adie, [facility], [place] [phone]   01/28/2016 7:37 AM

## 2016-01-28 NOTE — Progress Notes (Signed)
Pt is from [facility]. PT eval is pending. CSW is available to assist with d/c planning if a higher level of care is needed at d.c.  Werner Lean LCSW [phone]

## 2016-01-29 LAB — CBC
HCT: 37.1 % (ref 36.0–46.0)
Hemoglobin: 11.9 g/dL — ABNORMAL LOW (ref 12.0–15.0)
MCH: 29.1 pg (ref 26.0–34.0)
MCHC: 32.1 g/dL (ref 30.0–36.0)
MCV: 90.7 fL (ref 78.0–100.0)
PLATELETS: 177 10*3/uL (ref 150–400)
RBC: 4.09 MIL/uL (ref 3.87–5.11)
RDW: 15.2 % (ref 11.5–15.5)
WBC: 9.2 10*3/uL (ref 4.0–10.5)

## 2016-01-29 LAB — BASIC METABOLIC PANEL
Anion gap: 3 — ABNORMAL LOW (ref 5–15)
BUN: 7 mg/dL (ref 6–20)
CALCIUM: 8.3 mg/dL — AB (ref 8.9–10.3)
CO2: 23 mmol/L (ref 22–32)
CREATININE: 1.07 mg/dL — AB (ref 0.44–1.00)
Chloride: 107 mmol/L (ref 101–111)
GFR calc non Af Amer: 45 mL/min — ABNORMAL LOW (ref >60)
GFR, EST AFRICAN AMERICAN: 52 mL/min — AB (ref >60)
GLUCOSE: 486 mg/dL — AB (ref 65–99)
Potassium: 6.2 mmol/L — ABNORMAL HIGH (ref 3.5–5.1)
Sodium: 133 mmol/L — ABNORMAL LOW (ref 135–145)

## 2016-01-29 LAB — POTASSIUM: POTASSIUM: 4.1 mmol/L (ref 3.5–5.1)

## 2016-01-29 LAB — GLUCOSE, CAPILLARY: Glucose-Capillary: 120 mg/dL — ABNORMAL HIGH (ref 65–99)

## 2016-01-29 MED ORDER — DEXTROSE-NACL 5-0.45 % IV SOLN
INTRAVENOUS | Status: DC
  Administered 2016-01-29: 11:00:00 via INTRAVENOUS

## 2016-01-29 NOTE — Progress Notes (Signed)
Inpatient Diabetes Program Recommendations  AACE/ADA: New Consensus Statement on Inpatient Glycemic Control (2015)  Target Ranges:  Prepandial:   less than 140 mg/dL      Peak postprandial:   less than 180 mg/dL (1-2 hours)      Critically ill patients:  140 - 180 mg/dL   Results for [name], [name] (MRN [id number]) as of 01/29/2016 09:52  Ref. Range 01/28/2016 04:49 01/29/2016 04:37  Glucose Latest Ref Range: 65 - 99 mg/dL 245 (H) 486 (H)   Results for [name], [name] (MRN [id number]) as of 01/29/2016 09:52  Ref. Range 01/25/2016 14:30  Hemoglobin A1C Latest Ref Range: 4.8 - 5.6 % 5.6    Admit with: Colorectal Cancer/ Resection Surgery   -No History of DM noted.  -Lab glucose level elevated yesterday AM to 245 mg/dl.  -Lab glucose this AM 486 mg/dl.  Could this lab have possibly been drawn from the D5 1/2 NS IV line??  -Note patient did receive one dose of IV Decadron 10 mg on 07/26.    MD- If you note that patient continues to have elevated lab glucose levels, may want to check CBGs and cover with Novolog Sensitive Correction Scale/ SSI (0-9 units) TID AC + HS if CBGs are elevated     --Will follow patient during hospitalization--  Wyn Quaker RN, MSN, CDE Diabetes Coordinator Inpatient Glycemic Control Team Team Pager: [phone] (8a-5p)

## 2016-01-29 NOTE — Progress Notes (Addendum)
2 Days Post-Op Robotic LAR  Subjective: Doing well.  No problems overnight.  No nausea.  Passing some flatus  Objective: Vital signs in last 24 hours: Temp:  [97.6 F (36.4 C)-98.9 F (37.2 C)] 97.6 F (36.4 C) (07/28 0554) Pulse Rate:  [56-78] 56 (07/28 0554) Resp:  [14-16] 16 (07/28 0554) BP: (135-152)/(63-84) 152/71 (07/28 0554) SpO2:  [92 %-98 %] 92 % (07/28 0554)   Intake/Output from previous day: 07/27 0701 - 07/28 0700 In: 720 [P.O.:360; I.V.:360] Out: 2420 [Urine:2350; Drains:70] Intake/Output this shift: No intake/output data recorded.   General appearance: alert and cooperative GI: soft, appropriately tender, mild distention  Incision: no significant drainage  Lab Results:   Recent Labs  01/28/16 0449 01/29/16 0437  WBC 12.9* 9.2  HGB 12.3 11.9*  HCT 37.9 37.1  PLT 182 177   BMET  Recent Labs  01/28/16 0449 01/29/16 0437  NA 137 133*  K 4.4 6.2*  CL 109 107  CO2 23 23  GLUCOSE 245* 486*  BUN 10 7  CREATININE 1.06* 1.07*  CALCIUM 8.8* 8.3*   PT/INR No results for input(s): LABPROT, INR in the last 72 hours. ABG No results for input(s): PHART, HCO3 in the last 72 hours.  Invalid input(s): PCO2, PO2  MEDS, Scheduled . alvimopan  12 mg Oral BID  . enoxaparin (LOVENOX) injection  40 mg Subcutaneous Q24H  . famotidine  20 mg Oral QHS  . furosemide  40 mg Oral BID  . gabapentin  100 mg Oral QHS  . isosorbide mononitrate  30 mg Oral Daily  . levothyroxine  100 mcg Oral QAC breakfast  . pantoprazole  40 mg Oral Daily  . potassium chloride SA  20 mEq Oral Daily    Studies/Results: No results found.  Assessment: s/p Procedure(s): XI ROBOTIC ASSISTED LOW ANTERIOR RESECTION Patient Active Problem List   Diagnosis Date Noted  . Rectal cancer ([place]) 12/16/2015  . Depression 03/07/2014  . Hip fracture ([place]) 02/22/2014  . Fall 02/21/2014  . Fracture of femoral neck, left ([place]) 02/21/2014  . Acute pulmonary embolism ([place]) 01/04/2012  .  Acute pericardial effusion 01/04/2012  . Fracture of medial malleolus, left, closed, nondisplaced -12/19/2011 12/22/2011  . Fracture of left distal radius - 12/19/2011 12/21/2011  . Fracture of lateral malleolus of right ankle - 12/19/2011 12/21/2011  . Foot fracture, left - 12/19/2011 12/21/2011  . Closed fracture of distal clavicle 12/21/2011  . PERIPHERAL VASCULAR DISEASE 03/17/2008  . GASTRITIS 11/29/2007  . ADENOCARCINOMA, BREAST 10/16/2007  . VENOUS INSUFFICIENCY 10/16/2007  . HYPOTHYROIDISM 04/23/2007  . ANXIETY 04/23/2007  . HYPERTENSION 04/23/2007  . Diastolic dysfunction, left ventricle as evidenced by moderate LVH on echocardiogram June 2013 04/23/2007  . GERD 04/23/2007    Expected post op course  Plan: Advance diet to full liquids as tolerated Decrease MIV Ambulate in hall   LOS: 2 days     .Rosario Adie, [facility], [place] [phone]   01/29/2016 7:32 AM

## 2016-01-29 NOTE — Clinical Social Work Note (Addendum)
Clinical Social Work Assessment  Patient Details  Name: [name] MRN: [id number] Date of Birth: [birth year]  Date of referral:  01/29/16               Reason for consult:  Discharge Planning                Permission sought to share information with:  Family Supports Permission granted to share information::     Name::        Agency::     Relationship::     Contact Information:     Housing/Transportation Living arrangements for the past 2 months:  [place] of Information:  Patient Patient Interpreter Needed:  None Criminal Activity/Legal Involvement Pertinent to Current Situation/Hospitalization:  No - Comment as needed Significant Relationships:  Adult Children Lives with:  Self Do you feel safe going back to the place where you live?  Yes Need for family participation in patient care:  Yes (Comment)  Care giving concerns: No family at bedside. Pt requesting daughter be contacted for assistance with d/c planning.   Social Worker assessment / plan:  Pt hospitalized on 01/27/16 with rectal cancer. CSW consulted for assistance with d/c planning. PN reviewed. CSW met with pt at bedside. Pt reports that she is from [employer]. Pt requested daughter be contacted for assistance with d/c planning. PT recommends HHPT / 24/7 supervision / SNF. Pt prefers to return to her apt. Daughter will arrange pvt pay assistance. RNCM will assist with d/c planning. CSW will remain available in case plan changes.   Employment status:  Retired Forensic scientist:  [employer] PT Recommendations:  Research officer, trade union, [place], Home with [place] / Referral to community resources:     Patient/Family's Response to care:  Pt hopes to return home with additional assistance.   Patient/Family's Understanding of and Emotional Response to Diagnosis, Current Treatment, and Prognosis: Pt / daughter are aware of pt's  medical status. Pt would like to return home. Daughter supports that plan and will assist with arranging pvt help. Daughter is unable to assist due to her own help problems.  Emotional Assessment Appearance:  Appears stated age Attitude/Demeanor/Rapport:  Other (cooperative) Affect (typically observed):  Pleasant, Appropriate, Calm Orientation:  Oriented to Self, Oriented to Place, Oriented to  Time, Oriented to Situation Alcohol / Substance use:  Not Applicable Psych involvement (Current and /or in the community):  No (Comment)  Discharge Needs  Concerns to be addressed:  Discharge Planning Concerns Readmission within the last 30 days:  No Current discharge risk:  None Barriers to Discharge:  No Barriers Identified   Anjulie Dipierro, Randall An, LCSW 01/29/2016, 11:34 AM

## 2016-01-30 LAB — BASIC METABOLIC PANEL
Anion gap: 9 (ref 5–15)
BUN: 10 mg/dL (ref 6–20)
CHLORIDE: 100 mmol/L — AB (ref 101–111)
CO2: 26 mmol/L (ref 22–32)
CREATININE: 1.05 mg/dL — AB (ref 0.44–1.00)
Calcium: 9.6 mg/dL (ref 8.9–10.3)
GFR calc Af Amer: 53 mL/min — ABNORMAL LOW (ref >60)
GFR calc non Af Amer: 46 mL/min — ABNORMAL LOW (ref >60)
GLUCOSE: 119 mg/dL — AB (ref 65–99)
Potassium: 3.7 mmol/L (ref 3.5–5.1)
Sodium: 135 mmol/L (ref 135–145)

## 2016-01-30 LAB — CBC
HCT: 44 % (ref 36.0–46.0)
Hemoglobin: 14.2 g/dL (ref 12.0–15.0)
MCH: 28.9 pg (ref 26.0–34.0)
MCHC: 32.3 g/dL (ref 30.0–36.0)
MCV: 89.4 fL (ref 78.0–100.0)
PLATELETS: 207 10*3/uL (ref 150–400)
RBC: 4.92 MIL/uL (ref 3.87–5.11)
RDW: 15.2 % (ref 11.5–15.5)
WBC: 11.4 10*3/uL — ABNORMAL HIGH (ref 4.0–10.5)

## 2016-01-30 NOTE — Progress Notes (Signed)
3 Days Post-Op Robotic LAR  Subjective: Doing well.  No problems overnight.  No nausea.  Having bowel function  Objective: Vital signs in last 24 hours: Temp:  [97.9 F (36.6 C)-98.4 F (36.9 C)] 97.9 F (36.6 C) (07/29 0555) Pulse Rate:  [62-99] 83 (07/29 0555) Resp:  [15-16] 15 (07/29 0555) BP: (142-176)/(70-83) 151/83 (07/29 0555) SpO2:  [93 %-98 %] 98 % (07/29 0555)   Intake/Output from previous day: 07/28 0701 - 07/29 0700 In: 668.7 [P.O.:480; I.V.:188.7] Out: 25 [Drains:25] Intake/Output this shift: No intake/output data recorded.   General appearance: alert and cooperative GI: soft, appropriately tender, less distention  Incision: no significant drainage  Lab Results:   Recent Labs  01/29/16 0437 01/30/16 0434  WBC 9.2 11.4*  HGB 11.9* 14.2  HCT 37.1 44.0  PLT 177 207   BMET  Recent Labs  01/29/16 0437 01/29/16 0751 01/30/16 0434  NA 133*  --  135  K 6.2* 4.1 3.7  CL 107  --  100*  CO2 23  --  26  GLUCOSE 486*  --  119*  BUN 7  --  10  CREATININE 1.07*  --  1.05*  CALCIUM 8.3*  --  9.6   PT/INR No results for input(s): LABPROT, INR in the last 72 hours. ABG No results for input(s): PHART, HCO3 in the last 72 hours.  Invalid input(s): PCO2, PO2  MEDS, Scheduled . enoxaparin (LOVENOX) injection  40 mg Subcutaneous Q24H  . famotidine  20 mg Oral QHS  . furosemide  40 mg Oral BID  . gabapentin  100 mg Oral QHS  . isosorbide mononitrate  30 mg Oral Daily  . levothyroxine  100 mcg Oral QAC breakfast  . pantoprazole  40 mg Oral Daily    Studies/Results: No results found.  Assessment: s/p Procedure(s): XI ROBOTIC ASSISTED LOW ANTERIOR RESECTION Patient Active Problem List   Diagnosis Date Noted  . Rectal cancer ([place]) 12/16/2015  . Depression 03/07/2014  . Hip fracture ([place]) 02/22/2014  . Fall 02/21/2014  . Fracture of femoral neck, left ([place]) 02/21/2014  . Acute pulmonary embolism ([place]) 01/04/2012  . Acute pericardial effusion  01/04/2012  . Fracture of medial malleolus, left, closed, nondisplaced -12/19/2011 12/22/2011  . Fracture of left distal radius - 12/19/2011 12/21/2011  . Fracture of lateral malleolus of right ankle - 12/19/2011 12/21/2011  . Foot fracture, left - 12/19/2011 12/21/2011  . Closed fracture of distal clavicle 12/21/2011  . PERIPHERAL VASCULAR DISEASE 03/17/2008  . GASTRITIS 11/29/2007  . ADENOCARCINOMA, BREAST 10/16/2007  . VENOUS INSUFFICIENCY 10/16/2007  . HYPOTHYROIDISM 04/23/2007  . ANXIETY 04/23/2007  . HYPERTENSION 04/23/2007  . Diastolic dysfunction, left ventricle as evidenced by moderate LVH on echocardiogram June 2013 04/23/2007  . GERD 04/23/2007    Expected post op course  Plan: Advance diet to regular KVO MIV Ambulate in hall   LOS: 3 days     .Rosario Adie, [facility], [place] [phone]   01/30/2016 9:27 AM

## 2016-01-31 NOTE — Progress Notes (Signed)
4 Days Post-Op Robotic LAR  Subjective: Doing well.  No problems overnight.  No nausea.  Having bowel function.  Still eating full liquids despite diet advancement  Objective: Vital signs in last 24 hours: Temp:  [97.7 F (36.5 C)-98.3 F (36.8 C)] 97.7 F (36.5 C) (07/30 0618) Pulse Rate:  [84-103] 84 (07/30 0618) Resp:  [16-18] 16 (07/30 0618) BP: (112-148)/(62-94) 148/87 (07/30 0618) SpO2:  [96 %-100 %] 96 % (07/30 0618)   Intake/Output from previous day: 07/29 0701 - 07/30 0700 In: 1040 [P.O.:720; I.V.:320] Out: 320 [Urine:300; Drains:20] Intake/Output this shift: No intake/output data recorded.   General appearance: alert and cooperative GI: soft, appropriately tender, less distention  Incision: no significant drainage, no signs of infection  Lab Results:   Recent Labs  01/29/16 0437 01/30/16 0434  WBC 9.2 11.4*  HGB 11.9* 14.2  HCT 37.1 44.0  PLT 177 207   BMET  Recent Labs  01/29/16 0437 01/29/16 0751 01/30/16 0434  NA 133*  --  135  K 6.2* 4.1 3.7  CL 107  --  100*  CO2 23  --  26  GLUCOSE 486*  --  119*  BUN 7  --  10  CREATININE 1.07*  --  1.05*  CALCIUM 8.3*  --  9.6   PT/INR No results for input(s): LABPROT, INR in the last 72 hours. ABG No results for input(s): PHART, HCO3 in the last 72 hours.  Invalid input(s): PCO2, PO2  MEDS, Scheduled . enoxaparin (LOVENOX) injection  40 mg Subcutaneous Q24H  . famotidine  20 mg Oral QHS  . furosemide  40 mg Oral BID  . gabapentin  100 mg Oral QHS  . isosorbide mononitrate  30 mg Oral Daily  . levothyroxine  100 mcg Oral QAC breakfast  . pantoprazole  40 mg Oral Daily    Studies/Results: No results found.  Assessment: s/p Procedure(s): XI ROBOTIC ASSISTED LOW ANTERIOR RESECTION Patient Active Problem List   Diagnosis Date Noted  . Rectal cancer ([place]) 12/16/2015  . Depression 03/07/2014  . Hip fracture ([place]) 02/22/2014  . Fall 02/21/2014  . Fracture of femoral neck, left ([place])  02/21/2014  . Acute pulmonary embolism ([place]) 01/04/2012  . Acute pericardial effusion 01/04/2012  . Fracture of medial malleolus, left, closed, nondisplaced -12/19/2011 12/22/2011  . Fracture of left distal radius - 12/19/2011 12/21/2011  . Fracture of lateral malleolus of right ankle - 12/19/2011 12/21/2011  . Foot fracture, left - 12/19/2011 12/21/2011  . Closed fracture of distal clavicle 12/21/2011  . PERIPHERAL VASCULAR DISEASE 03/17/2008  . GASTRITIS 11/29/2007  . ADENOCARCINOMA, BREAST 10/16/2007  . VENOUS INSUFFICIENCY 10/16/2007  . HYPOTHYROIDISM 04/23/2007  . ANXIETY 04/23/2007  . HYPERTENSION 04/23/2007  . Diastolic dysfunction, left ventricle as evidenced by moderate LVH on echocardiogram June 2013 04/23/2007  . GERD 04/23/2007    Expected post op course  Plan: Cont regular KVO MIV Ambulate in hall Plan on d/c in AM   LOS: 4 days     .Rosario Adie, [facility], [place]   01/31/2016 10:21 AM

## 2016-02-01 ENCOUNTER — Ambulatory Visit: Payer: Medicare Other | Admitting: Oncology

## 2016-02-01 MED ORDER — HYDROCODONE-ACETAMINOPHEN 5-325 MG PO TABS: 1.0000 | ORAL_TABLET | Freq: Four times a day (QID) | ORAL | 0 refills | Status: DC | PRN

## 2016-02-01 NOTE — Progress Notes (Signed)
Pt's vital are WNL, pain is under controlled and diet is tolerated. Discussed discharge instructions with both the patient and daughter. Discharged to home with home health and prescriptions.

## 2016-02-01 NOTE — Progress Notes (Signed)
Spoke with daughter, Ivin Booty, on the phone per patient's request. She states patient used [facility] in the past for [facility] services and she would like to use them again. She has arranged for private duty assistance to help with ADL's. Patient has all DME that she should need already in place (RW, 3n1). Daughter will provide transportation.

## 2016-02-01 NOTE — Care Management Important Message (Signed)
Important Message  Patient Details  Name: TABER SANKEY MRN: [id number] Date of Birth: 21-Jul-1924   Medicare Important Message Given:  Yes    Camillo Flaming 02/01/2016, 11:14 AMImportant Message  Patient Details  Name: PREESHA NICOSIA MRN: [id number] Date of Birth: [birth year]   Medicare Important Message Given:  Yes    Camillo Flaming 02/01/2016, 11:14 AM

## 2016-02-01 NOTE — Discharge Instructions (Signed)

## 2016-02-01 NOTE — Discharge Summary (Signed)
Physician Discharge Summary  Patient ID: RASHUNDA MINNECI MRN: [id number] DOB/AGE: 10-06-1924 82 y.o.  Admit date: 01/27/2016 Discharge date: 02/01/2016  Admission Diagnoses: Rectal cancer  Discharge Diagnoses:  Active Problems:   Rectal cancer ([place])  Stage 1  Discharged Condition: good  Hospital Course: 82 y.o. F with proximal rectal cancer found on colonoscopy.  Patient underwent robotic LAR.  She tolerated this well.  Her diet was advanced as tolerated.  Her foley was removed on POD 2.  She had no trouble urinating.  She began to ambulate with assistance.  She began to have bowel function on POD 2.  She was discharged to home once she was felt to be tolerating a diet and her pain was controlled.  Consults: None  Significant Diagnostic Studies: labs: cbc, chemistry  Treatments: IV hydration, analgesia: Vicodin and surgery: see above  Discharge Exam: Blood pressure (!) 166/77, pulse 71, temperature 98.1 F (36.7 C), temperature source Oral, resp. rate 16, height 5\' 6"  (1.676 m), weight 75.8 kg (167 lb), SpO2 97 %. General appearance: alert and cooperative GI: soft, non-distended Incision/Wound: clean, dry  Disposition: 01-Home or Self Care     Medication List    TAKE these medications   aspirin EC 81 MG tablet Take 81 mg by mouth every evening.   esomeprazole 40 MG capsule Commonly known as:  NEXIUM Take 1 capsule (40 mg total) by mouth daily before breakfast.   furosemide 20 MG tablet Commonly known as:  LASIX Take 40 mg by mouth 2 (two) times daily. Reported on 12/02/2015   gabapentin 100 MG capsule Commonly known as:  NEURONTIN Take 1 capsule (100 mg total) by mouth at bedtime.   HYDROcodone-acetaminophen 5-325 MG tablet Commonly known as:  NORCO Take 1-2 tablets by mouth every 6 (six) hours as needed for moderate pain. What changed:  when to take this   isosorbide mononitrate 30 MG 24 hr tablet Commonly known as:  IMDUR Take 30 mg by mouth daily.    levothyroxine 100 MCG tablet Commonly known as:  SYNTHROID, LEVOTHROID Take 1 tablet (100 mcg total) by mouth daily.   LORazepam 1 MG tablet Commonly known as:  ATIVAN Take one tablet by mouth every 6 hours as needed for anxiety What changed:  how much to take  how to take this  when to take this  reasons to take this  additional instructions   multivitamin tablet Take 2 tablets by mouth daily.   naproxen sodium 220 MG tablet Commonly known as:  ANAPROX Take 220 mg by mouth 2 (two) times daily as needed (for pain).   polyethylene glycol packet Commonly known as:  MIRALAX / GLYCOLAX Take 17 g by mouth daily.   potassium chloride SA 20 MEQ tablet Commonly known as:  KLOR-CON M20 Take 1 tablet (20 mEq total) by mouth daily.   ranitidine 150 MG tablet Commonly known as:  ZANTAC Take 150 mg by mouth at bedtime. Reported on 12/02/2015   VOLTAREN 1 % Gel Generic drug:  diclofenac sodium Apply 2 g topically 2 (two) times daily.      Follow-up Information    Rosario Adie., MD. Schedule an appointment as soon as possible for a visit in 2 week(s).   Specialty:  General Surgery Contact information: [place] [phone]           Signed: Rosario Adie [id number], [id number] AM

## 2016-02-09 ENCOUNTER — Encounter (HOSPITAL_COMMUNITY): Payer: Self-pay

## 2016-02-19 ENCOUNTER — Ambulatory Visit (HOSPITAL_BASED_OUTPATIENT_CLINIC_OR_DEPARTMENT_OTHER): Payer: Medicare Other | Admitting: Oncology

## 2016-02-19 ENCOUNTER — Telehealth: Payer: Self-pay | Admitting: Oncology

## 2016-02-19 VITALS — BP 172/60 | HR 60 | Temp 98.1°F | Resp 18 | Ht 66.0 in | Wt 165.3 lb

## 2016-02-19 DIAGNOSIS — Z85048 Personal history of other malignant neoplasm of rectum, rectosigmoid junction, and anus: Secondary | ICD-10-CM | POA: Diagnosis not present

## 2016-02-19 DIAGNOSIS — Z86711 Personal history of pulmonary embolism: Secondary | ICD-10-CM | POA: Diagnosis not present

## 2016-02-19 DIAGNOSIS — Z853 Personal history of malignant neoplasm of breast: Secondary | ICD-10-CM | POA: Diagnosis not present

## 2016-02-19 DIAGNOSIS — C20 Malignant neoplasm of rectum: Secondary | ICD-10-CM
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
## 2016-02-19 NOTE — Telephone Encounter (Signed)
Gave pt avs °

## 2016-03-13 DIAGNOSIS — Z23 Encounter for immunization: Secondary | ICD-10-CM | POA: Diagnosis not present

## 2016-03-23 DIAGNOSIS — M542 Cervicalgia: Secondary | ICD-10-CM | POA: Diagnosis not present

## 2016-04-04 ENCOUNTER — Encounter (INDEPENDENT_AMBULATORY_CARE_PROVIDER_SITE_OTHER): Payer: Medicare Other | Admitting: Physical Medicine and Rehabilitation

## 2016-04-04 DIAGNOSIS — M47812 Spondylosis without myelopathy or radiculopathy, cervical region: Secondary | ICD-10-CM | POA: Diagnosis not present

## 2016-04-25 DIAGNOSIS — R21 Rash and other nonspecific skin eruption: Secondary | ICD-10-CM | POA: Diagnosis not present

## 2016-05-23 ENCOUNTER — Other Ambulatory Visit (INDEPENDENT_AMBULATORY_CARE_PROVIDER_SITE_OTHER): Payer: Self-pay | Admitting: Orthopedic Surgery

## 2016-05-23 NOTE — Telephone Encounter (Signed)
Rx refill

## 2016-05-23 NOTE — Telephone Encounter (Signed)
Okay to refill please call thanks

## 2016-05-24 NOTE — Telephone Encounter (Signed)
IC Rx in to pharm, LMVM 

## 2016-05-24 NOTE — Telephone Encounter (Signed)
done

## 2016-05-24 NOTE — Telephone Encounter (Signed)
I cant rxl this in pls call it in

## 2016-07-05 DIAGNOSIS — K219 Gastro-esophageal reflux disease without esophagitis: Secondary | ICD-10-CM | POA: Diagnosis not present

## 2016-07-05 DIAGNOSIS — Z23 Encounter for immunization: Secondary | ICD-10-CM | POA: Diagnosis not present

## 2016-07-05 DIAGNOSIS — R030 Elevated blood-pressure reading, without diagnosis of hypertension: Secondary | ICD-10-CM | POA: Diagnosis not present

## 2016-07-05 DIAGNOSIS — J209 Acute bronchitis, unspecified: Secondary | ICD-10-CM | POA: Diagnosis not present

## 2016-07-07 ENCOUNTER — Other Ambulatory Visit (INDEPENDENT_AMBULATORY_CARE_PROVIDER_SITE_OTHER): Payer: Self-pay | Admitting: Orthopedic Surgery

## 2016-07-08 NOTE — Telephone Encounter (Signed)
OK refill thanks 

## 2016-07-08 NOTE — Telephone Encounter (Signed)
Dean pt, can you advise ok to refill?

## 2016-07-14 DIAGNOSIS — D3131 Benign neoplasm of right choroid: Secondary | ICD-10-CM | POA: Diagnosis not present

## 2016-07-14 DIAGNOSIS — H401111 Primary open-angle glaucoma, right eye, mild stage: Secondary | ICD-10-CM | POA: Diagnosis not present

## 2016-07-14 DIAGNOSIS — Z961 Presence of intraocular lens: Secondary | ICD-10-CM | POA: Diagnosis not present

## 2016-07-14 DIAGNOSIS — H26491 Other secondary cataract, right eye: Secondary | ICD-10-CM | POA: Diagnosis not present

## 2016-07-14 DIAGNOSIS — H1851 Endothelial corneal dystrophy: Secondary | ICD-10-CM | POA: Diagnosis not present

## 2016-07-14 DIAGNOSIS — H04123 Dry eye syndrome of bilateral lacrimal glands: Secondary | ICD-10-CM | POA: Diagnosis not present

## 2016-07-14 DIAGNOSIS — H401122 Primary open-angle glaucoma, left eye, moderate stage: Secondary | ICD-10-CM | POA: Diagnosis not present

## 2016-08-15 DIAGNOSIS — R05 Cough: Secondary | ICD-10-CM | POA: Diagnosis not present

## 2016-08-15 DIAGNOSIS — M8580 Other specified disorders of bone density and structure, unspecified site: Secondary | ICD-10-CM | POA: Diagnosis not present

## 2016-08-15 DIAGNOSIS — E039 Hypothyroidism, unspecified: Secondary | ICD-10-CM | POA: Diagnosis not present

## 2016-08-15 DIAGNOSIS — G459 Transient cerebral ischemic attack, unspecified: Secondary | ICD-10-CM | POA: Diagnosis not present

## 2016-08-15 DIAGNOSIS — C20 Malignant neoplasm of rectum: Secondary | ICD-10-CM | POA: Diagnosis not present

## 2016-08-15 DIAGNOSIS — Z0000 Encounter for general adult medical examination without abnormal findings: Secondary | ICD-10-CM | POA: Diagnosis not present

## 2016-08-15 DIAGNOSIS — M542 Cervicalgia: Secondary | ICD-10-CM | POA: Diagnosis not present

## 2016-08-15 DIAGNOSIS — Z1389 Encounter for screening for other disorder: Secondary | ICD-10-CM | POA: Diagnosis not present

## 2016-08-15 DIAGNOSIS — I10 Essential (primary) hypertension: Secondary | ICD-10-CM | POA: Diagnosis not present

## 2016-08-15 DIAGNOSIS — Z853 Personal history of malignant neoplasm of breast: Secondary | ICD-10-CM | POA: Diagnosis not present

## 2016-08-15 DIAGNOSIS — F419 Anxiety disorder, unspecified: Secondary | ICD-10-CM | POA: Diagnosis not present

## 2016-08-15 DIAGNOSIS — K5900 Constipation, unspecified: Secondary | ICD-10-CM | POA: Diagnosis not present

## 2016-08-29 ENCOUNTER — Other Ambulatory Visit (INDEPENDENT_AMBULATORY_CARE_PROVIDER_SITE_OTHER): Payer: Self-pay | Admitting: Orthopaedic Surgery

## 2016-08-29 NOTE — Telephone Encounter (Signed)
Rx request 

## 2016-08-29 NOTE — Telephone Encounter (Signed)
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

## 2016-10-03 DIAGNOSIS — M8588 Other specified disorders of bone density and structure, other site: Secondary | ICD-10-CM | POA: Diagnosis not present

## 2016-10-05 ENCOUNTER — Other Ambulatory Visit (INDEPENDENT_AMBULATORY_CARE_PROVIDER_SITE_OTHER): Payer: Self-pay | Admitting: Orthopedic Surgery

## 2016-10-06 NOTE — Telephone Encounter (Signed)
Rx request 

## 2016-10-07 ENCOUNTER — Other Ambulatory Visit (INDEPENDENT_AMBULATORY_CARE_PROVIDER_SITE_OTHER): Payer: Self-pay | Admitting: Orthopedic Surgery

## 2016-10-07 NOTE — Telephone Encounter (Signed)
IC pharm LMVM with Rx 

## 2016-10-18 ENCOUNTER — Encounter: Payer: Self-pay | Admitting: Gastroenterology

## 2016-11-07 ENCOUNTER — Other Ambulatory Visit (INDEPENDENT_AMBULATORY_CARE_PROVIDER_SITE_OTHER): Payer: Self-pay | Admitting: Orthopedic Surgery

## 2016-11-07 NOTE — Telephone Encounter (Signed)
y

## 2016-11-07 NOTE — Telephone Encounter (Signed)
Ok to rf? 

## 2016-11-08 NOTE — Telephone Encounter (Signed)
This was called into pharmacy yesterday.

## 2016-12-12 ENCOUNTER — Other Ambulatory Visit (INDEPENDENT_AMBULATORY_CARE_PROVIDER_SITE_OTHER): Payer: Self-pay | Admitting: Orthopedic Surgery

## 2016-12-12 NOTE — Telephone Encounter (Signed)
Okay to refill in 4 days please call thanks

## 2016-12-12 NOTE — Telephone Encounter (Signed)
Rx request 

## 2016-12-13 NOTE — Telephone Encounter (Signed)
Ok to rf? Not seen since 03/2016

## 2016-12-13 NOTE — Telephone Encounter (Signed)
Sneeds rov [pls call thx

## 2016-12-14 ENCOUNTER — Telehealth (INDEPENDENT_AMBULATORY_CARE_PROVIDER_SITE_OTHER): Payer: Self-pay | Admitting: Orthopedic Surgery
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
## 2016-12-14 NOTE — Telephone Encounter (Signed)
Patient called asking for refill on tramadol. CB # [phone]

## 2016-12-15 NOTE — Telephone Encounter (Signed)
I tried to call patient. No answer. LMVM for her advising per Dr Marlou Sa

## 2016-12-15 NOTE — Telephone Encounter (Signed)
I cant refill meds in perpetuity without clinical recheck - if she would prefer her med dr do it that is fine pls clal thx

## 2016-12-22 ENCOUNTER — Ambulatory Visit (INDEPENDENT_AMBULATORY_CARE_PROVIDER_SITE_OTHER): Payer: Medicare Other | Admitting: Orthopedic Surgery

## 2016-12-28 ENCOUNTER — Encounter (INDEPENDENT_AMBULATORY_CARE_PROVIDER_SITE_OTHER): Payer: Self-pay | Admitting: Orthopedic Surgery

## 2016-12-28 ENCOUNTER — Ambulatory Visit (INDEPENDENT_AMBULATORY_CARE_PROVIDER_SITE_OTHER): Payer: Medicare Other | Admitting: Orthopedic Surgery

## 2016-12-28 DIAGNOSIS — M25561 Pain in right knee: Principal | ICD-10-CM

## 2016-12-28 DIAGNOSIS — G8929 Other chronic pain: Secondary | ICD-10-CM

## 2016-12-28 MED ORDER — TRAMADOL HCL 50 MG PO TABS: ORAL_TABLET | ORAL | 0 refills | Status: DC

## 2016-12-29 DIAGNOSIS — G8929 Other chronic pain: Secondary | ICD-10-CM | POA: Diagnosis not present

## 2016-12-29 DIAGNOSIS — M25561 Pain in right knee: Secondary | ICD-10-CM | POA: Diagnosis not present

## 2016-12-29 MED ORDER — METHYLPREDNISOLONE ACETATE 40 MG/ML IJ SUSP
40.0000 mg | INTRAMUSCULAR | Status: AC | PRN
  Administered 2016-12-29: 40 mg via INTRA_ARTICULAR

## 2016-12-29 MED ORDER — BUPIVACAINE HCL 0.25 % IJ SOLN
4.0000 mL | INTRAMUSCULAR | Status: AC | PRN
  Administered 2016-12-29: 4 mL via INTRA_ARTICULAR

## 2016-12-29 MED ORDER — LIDOCAINE HCL 1 % IJ SOLN
5.0000 mL | INTRAMUSCULAR | Status: AC | PRN
  Administered 2016-12-29: 5 mL
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
## 2017-01-05 ENCOUNTER — Other Ambulatory Visit (INDEPENDENT_AMBULATORY_CARE_PROVIDER_SITE_OTHER): Payer: Self-pay | Admitting: Orthopedic Surgery

## 2017-01-05 NOTE — Telephone Encounter (Signed)
y

## 2017-01-05 NOTE — Telephone Encounter (Signed)
Ok to rf? 

## 2017-02-06 ENCOUNTER — Other Ambulatory Visit (INDEPENDENT_AMBULATORY_CARE_PROVIDER_SITE_OTHER): Payer: Self-pay | Admitting: Orthopedic Surgery

## 2017-02-06 NOTE — Telephone Encounter (Signed)
Ok to rf? 

## 2017-02-06 NOTE — Telephone Encounter (Signed)
y

## 2017-02-08 ENCOUNTER — Other Ambulatory Visit (INDEPENDENT_AMBULATORY_CARE_PROVIDER_SITE_OTHER): Payer: Self-pay | Admitting: Orthopedic Surgery

## 2017-02-08 NOTE — Telephone Encounter (Signed)
Ok to rf? 

## 2017-02-08 NOTE — Telephone Encounter (Signed)
Okay for 30 for 8 weeks

## 2017-02-09 NOTE — Telephone Encounter (Signed)
Called into pharm  

## 2017-02-13 DIAGNOSIS — C20 Malignant neoplasm of rectum: Secondary | ICD-10-CM | POA: Diagnosis not present

## 2017-02-13 DIAGNOSIS — F419 Anxiety disorder, unspecified: Secondary | ICD-10-CM | POA: Diagnosis not present

## 2017-02-13 DIAGNOSIS — I519 Heart disease, unspecified: Secondary | ICD-10-CM | POA: Diagnosis not present

## 2017-02-13 DIAGNOSIS — E039 Hypothyroidism, unspecified: Secondary | ICD-10-CM | POA: Diagnosis not present

## 2017-02-13 DIAGNOSIS — R600 Localized edema: Secondary | ICD-10-CM | POA: Diagnosis not present

## 2017-02-13 DIAGNOSIS — I10 Essential (primary) hypertension: Secondary | ICD-10-CM | POA: Diagnosis not present

## 2017-02-13 DIAGNOSIS — M8580 Other specified disorders of bone density and structure, unspecified site: Secondary | ICD-10-CM | POA: Diagnosis not present

## 2017-02-13 DIAGNOSIS — Z853 Personal history of malignant neoplasm of breast: Secondary | ICD-10-CM | POA: Diagnosis not present

## 2017-02-22 ENCOUNTER — Other Ambulatory Visit: Payer: Self-pay | Admitting: Internal Medicine

## 2017-02-22 ENCOUNTER — Ambulatory Visit
Admission: RE | Admit: 2017-02-22 | Discharge: 2017-02-22 | Disposition: A | Discharge status: Home / Self Care | Payer: Medicare Other | Source: Ambulatory Visit | Attending: Internal Medicine | Admitting: Internal Medicine

## 2017-02-22 DIAGNOSIS — I509 Heart failure, unspecified: Secondary | ICD-10-CM

## 2017-02-22 DIAGNOSIS — R600 Localized edema: Secondary | ICD-10-CM | POA: Diagnosis not present

## 2017-02-22 DIAGNOSIS — R0600 Dyspnea, unspecified: Secondary | ICD-10-CM | POA: Diagnosis not present

## 2017-02-27 ENCOUNTER — Other Ambulatory Visit (HOSPITAL_COMMUNITY): Payer: Medicare Other

## 2017-03-08 DIAGNOSIS — R600 Localized edema: Secondary | ICD-10-CM | POA: Diagnosis not present

## 2017-03-08 DIAGNOSIS — I509 Heart failure, unspecified: Secondary | ICD-10-CM | POA: Diagnosis not present

## 2017-03-08 DIAGNOSIS — R0600 Dyspnea, unspecified: Secondary | ICD-10-CM | POA: Diagnosis not present

## 2017-03-09 ENCOUNTER — Other Ambulatory Visit (INDEPENDENT_AMBULATORY_CARE_PROVIDER_SITE_OTHER): Payer: Self-pay | Admitting: Orthopedic Surgery

## 2017-03-09 NOTE — Telephone Encounter (Signed)
Ok to rf? 

## 2017-03-09 NOTE — Telephone Encounter (Signed)
y

## 2017-03-21 ENCOUNTER — Other Ambulatory Visit (HOSPITAL_COMMUNITY): Payer: Medicare Other

## 2017-03-21 ENCOUNTER — Other Ambulatory Visit: Payer: Self-pay

## 2017-03-21 ENCOUNTER — Ambulatory Visit (HOSPITAL_COMMUNITY): Payer: Medicare Other | Attending: Cardiology

## 2017-03-21 DIAGNOSIS — I110 Hypertensive heart disease with heart failure: Secondary | ICD-10-CM | POA: Insufficient documentation

## 2017-03-21 DIAGNOSIS — I081 Rheumatic disorders of both mitral and tricuspid valves: Secondary | ICD-10-CM | POA: Diagnosis not present

## 2017-03-21 DIAGNOSIS — I509 Heart failure, unspecified: Secondary | ICD-10-CM

## 2017-03-22 ENCOUNTER — Other Ambulatory Visit (INDEPENDENT_AMBULATORY_CARE_PROVIDER_SITE_OTHER): Payer: Self-pay | Admitting: Orthopedic Surgery

## 2017-03-22 NOTE — Telephone Encounter (Signed)
y

## 2017-03-22 NOTE — Telephone Encounter (Signed)
Ok to rf? 

## 2017-03-27 DIAGNOSIS — M542 Cervicalgia: Secondary | ICD-10-CM | POA: Diagnosis not present

## 2017-03-28 DIAGNOSIS — I509 Heart failure, unspecified: Secondary | ICD-10-CM | POA: Diagnosis not present

## 2017-03-28 DIAGNOSIS — E038 Other specified hypothyroidism: Secondary | ICD-10-CM | POA: Diagnosis not present

## 2017-03-28 DIAGNOSIS — M542 Cervicalgia: Secondary | ICD-10-CM | POA: Diagnosis not present

## 2017-03-30 DIAGNOSIS — M542 Cervicalgia: Secondary | ICD-10-CM | POA: Diagnosis not present

## 2017-04-01 DIAGNOSIS — Z23 Encounter for immunization: Secondary | ICD-10-CM | POA: Diagnosis not present

## 2017-04-03 DIAGNOSIS — M542 Cervicalgia: Secondary | ICD-10-CM | POA: Diagnosis not present

## 2017-04-04 DIAGNOSIS — M542 Cervicalgia: Secondary | ICD-10-CM | POA: Diagnosis not present

## 2017-04-06 ENCOUNTER — Other Ambulatory Visit (INDEPENDENT_AMBULATORY_CARE_PROVIDER_SITE_OTHER): Payer: Self-pay | Admitting: Orthopedic Surgery

## 2017-04-06 DIAGNOSIS — M542 Cervicalgia: Secondary | ICD-10-CM | POA: Diagnosis not present

## 2017-04-06 NOTE — Telephone Encounter (Signed)
y

## 2017-04-06 NOTE — Telephone Encounter (Signed)
Ok to rf? 

## 2017-04-11 DIAGNOSIS — M542 Cervicalgia: Secondary | ICD-10-CM | POA: Diagnosis not present

## 2017-04-13 DIAGNOSIS — M542 Cervicalgia: Secondary | ICD-10-CM | POA: Diagnosis not present

## 2017-04-14 DIAGNOSIS — M542 Cervicalgia: Secondary | ICD-10-CM | POA: Diagnosis not present

## 2017-04-20 DIAGNOSIS — M542 Cervicalgia: Secondary | ICD-10-CM | POA: Diagnosis not present

## 2017-04-21 ENCOUNTER — Other Ambulatory Visit (INDEPENDENT_AMBULATORY_CARE_PROVIDER_SITE_OTHER): Payer: Self-pay | Admitting: Orthopedic Surgery

## 2017-04-21 NOTE — Telephone Encounter (Signed)
y

## 2017-04-21 NOTE — Telephone Encounter (Signed)
rx called to pharmacy 

## 2017-04-21 NOTE — Telephone Encounter (Signed)
Ok to rf? 

## 2017-04-24 DIAGNOSIS — M542 Cervicalgia: Secondary | ICD-10-CM | POA: Diagnosis not present

## 2017-04-27 DIAGNOSIS — M542 Cervicalgia: Secondary | ICD-10-CM | POA: Diagnosis not present

## 2017-04-28 ENCOUNTER — Other Ambulatory Visit: Payer: Self-pay | Admitting: Internal Medicine

## 2017-04-28 DIAGNOSIS — M542 Cervicalgia: Secondary | ICD-10-CM | POA: Diagnosis not present

## 2017-04-28 DIAGNOSIS — I350 Nonrheumatic aortic (valve) stenosis: Secondary | ICD-10-CM | POA: Diagnosis not present

## 2017-04-28 DIAGNOSIS — I6529 Occlusion and stenosis of unspecified carotid artery: Secondary | ICD-10-CM

## 2017-04-28 DIAGNOSIS — R42 Dizziness and giddiness: Secondary | ICD-10-CM | POA: Diagnosis not present

## 2017-05-03 ENCOUNTER — Ambulatory Visit
Admission: RE | Admit: 2017-05-03 | Discharge: 2017-05-03 | Disposition: A | Discharge status: Home / Self Care | Payer: Medicare Other | Source: Ambulatory Visit | Attending: Internal Medicine | Admitting: Internal Medicine

## 2017-05-03 DIAGNOSIS — I6529 Occlusion and stenosis of unspecified carotid artery: Secondary | ICD-10-CM

## 2017-05-03 DIAGNOSIS — I6523 Occlusion and stenosis of bilateral carotid arteries: Secondary | ICD-10-CM | POA: Diagnosis not present

## 2017-05-04 ENCOUNTER — Other Ambulatory Visit (INDEPENDENT_AMBULATORY_CARE_PROVIDER_SITE_OTHER): Payer: Self-pay | Admitting: Orthopedic Surgery

## 2017-05-22 ENCOUNTER — Other Ambulatory Visit (INDEPENDENT_AMBULATORY_CARE_PROVIDER_SITE_OTHER): Payer: Self-pay | Admitting: Orthopedic Surgery

## 2017-05-22 NOTE — Telephone Encounter (Signed)
y

## 2017-05-22 NOTE — Telephone Encounter (Signed)
Ok to rf? 

## 2017-06-06 ENCOUNTER — Other Ambulatory Visit (INDEPENDENT_AMBULATORY_CARE_PROVIDER_SITE_OTHER): Payer: Self-pay | Admitting: Orthopedic Surgery

## 2017-06-06 NOTE — Telephone Encounter (Signed)
y

## 2017-06-06 NOTE — Telephone Encounter (Signed)
Ok for refill? 

## 2017-06-21 ENCOUNTER — Other Ambulatory Visit (INDEPENDENT_AMBULATORY_CARE_PROVIDER_SITE_OTHER): Payer: Self-pay | Admitting: Orthopedic Surgery

## 2017-06-22 NOTE — Telephone Encounter (Signed)
y

## 2017-06-22 NOTE — Telephone Encounter (Signed)
Rx request 

## 2017-07-06 ENCOUNTER — Other Ambulatory Visit (INDEPENDENT_AMBULATORY_CARE_PROVIDER_SITE_OTHER): Payer: Self-pay | Admitting: Orthopedic Surgery

## 2017-07-19 DIAGNOSIS — Z961 Presence of intraocular lens: Secondary | ICD-10-CM | POA: Diagnosis not present

## 2017-07-19 DIAGNOSIS — D3131 Benign neoplasm of right choroid: Secondary | ICD-10-CM | POA: Diagnosis not present

## 2017-07-19 DIAGNOSIS — H04123 Dry eye syndrome of bilateral lacrimal glands: Secondary | ICD-10-CM | POA: Diagnosis not present

## 2017-07-19 DIAGNOSIS — H401132 Primary open-angle glaucoma, bilateral, moderate stage: Secondary | ICD-10-CM | POA: Diagnosis not present

## 2017-07-19 DIAGNOSIS — H1859 Other hereditary corneal dystrophies: Secondary | ICD-10-CM | POA: Diagnosis not present

## 2017-07-19 DIAGNOSIS — H1851 Endothelial corneal dystrophy: Secondary | ICD-10-CM | POA: Diagnosis not present

## 2017-07-20 ENCOUNTER — Other Ambulatory Visit (INDEPENDENT_AMBULATORY_CARE_PROVIDER_SITE_OTHER): Payer: Self-pay | Admitting: Orthopedic Surgery

## 2017-07-20 NOTE — Telephone Encounter (Signed)
Ok to rf? 

## 2017-07-20 NOTE — Telephone Encounter (Signed)
Ok to fill 2 1 2019

## 2017-08-03 ENCOUNTER — Other Ambulatory Visit (INDEPENDENT_AMBULATORY_CARE_PROVIDER_SITE_OTHER): Payer: Self-pay | Admitting: Orthopedic Surgery

## 2017-08-03 NOTE — Telephone Encounter (Signed)
y

## 2017-08-03 NOTE — Telephone Encounter (Signed)
Ok to rf? 

## 2017-08-22 DIAGNOSIS — C20 Malignant neoplasm of rectum: Secondary | ICD-10-CM | POA: Diagnosis not present

## 2017-08-22 DIAGNOSIS — F419 Anxiety disorder, unspecified: Secondary | ICD-10-CM | POA: Diagnosis not present

## 2017-08-22 DIAGNOSIS — Z1389 Encounter for screening for other disorder: Secondary | ICD-10-CM | POA: Diagnosis not present

## 2017-08-22 DIAGNOSIS — Z0000 Encounter for general adult medical examination without abnormal findings: Secondary | ICD-10-CM | POA: Diagnosis not present

## 2017-08-22 DIAGNOSIS — R5383 Other fatigue: Secondary | ICD-10-CM | POA: Diagnosis not present

## 2017-08-22 DIAGNOSIS — I519 Heart disease, unspecified: Secondary | ICD-10-CM | POA: Diagnosis not present

## 2017-08-22 DIAGNOSIS — E039 Hypothyroidism, unspecified: Secondary | ICD-10-CM | POA: Diagnosis not present

## 2017-08-22 DIAGNOSIS — R413 Other amnesia: Secondary | ICD-10-CM | POA: Diagnosis not present

## 2017-08-22 DIAGNOSIS — I10 Essential (primary) hypertension: Secondary | ICD-10-CM | POA: Diagnosis not present

## 2017-09-01 ENCOUNTER — Other Ambulatory Visit (INDEPENDENT_AMBULATORY_CARE_PROVIDER_SITE_OTHER): Payer: Self-pay | Admitting: Orthopedic Surgery

## 2017-09-04 NOTE — Telephone Encounter (Signed)
y

## 2017-09-04 NOTE — Telephone Encounter (Signed)
Ok to rf? 

## 2017-11-07 DIAGNOSIS — H6123 Impacted cerumen, bilateral: Secondary | ICD-10-CM | POA: Diagnosis not present

## 2017-12-09 ENCOUNTER — Other Ambulatory Visit (INDEPENDENT_AMBULATORY_CARE_PROVIDER_SITE_OTHER): Payer: Self-pay | Admitting: Orthopedic Surgery

## 2017-12-11 DIAGNOSIS — R05 Cough: Secondary | ICD-10-CM | POA: Diagnosis not present

## 2017-12-11 NOTE — Telephone Encounter (Signed)
Ok to rf? 

## 2017-12-11 NOTE — Telephone Encounter (Signed)
y

## 2018-01-02 ENCOUNTER — Other Ambulatory Visit (INDEPENDENT_AMBULATORY_CARE_PROVIDER_SITE_OTHER): Payer: Self-pay | Admitting: Orthopedic Surgery

## 2018-01-30 ENCOUNTER — Other Ambulatory Visit (INDEPENDENT_AMBULATORY_CARE_PROVIDER_SITE_OTHER): Payer: Self-pay | Admitting: Orthopedic Surgery

## 2018-02-20 ENCOUNTER — Other Ambulatory Visit: Payer: Self-pay | Admitting: Internal Medicine

## 2018-02-20 ENCOUNTER — Ambulatory Visit
Admission: RE | Admit: 2018-02-20 | Discharge: 2018-02-20 | Disposition: A | Discharge status: Home / Self Care | Payer: Medicare Other | Source: Ambulatory Visit | Attending: Internal Medicine | Admitting: Internal Medicine

## 2018-02-20 DIAGNOSIS — R198 Other specified symptoms and signs involving the digestive system and abdomen: Secondary | ICD-10-CM | POA: Diagnosis not present

## 2018-02-20 DIAGNOSIS — I10 Essential (primary) hypertension: Secondary | ICD-10-CM | POA: Diagnosis not present

## 2018-02-20 DIAGNOSIS — F419 Anxiety disorder, unspecified: Secondary | ICD-10-CM | POA: Diagnosis not present

## 2018-02-20 DIAGNOSIS — I350 Nonrheumatic aortic (valve) stenosis: Secondary | ICD-10-CM | POA: Diagnosis not present

## 2018-02-20 DIAGNOSIS — R197 Diarrhea, unspecified: Secondary | ICD-10-CM | POA: Diagnosis not present

## 2018-02-20 DIAGNOSIS — Z85048 Personal history of other malignant neoplasm of rectum, rectosigmoid junction, and anus: Secondary | ICD-10-CM | POA: Diagnosis not present

## 2018-02-20 DIAGNOSIS — I6529 Occlusion and stenosis of unspecified carotid artery: Secondary | ICD-10-CM | POA: Diagnosis not present

## 2018-02-20 DIAGNOSIS — E039 Hypothyroidism, unspecified: Secondary | ICD-10-CM | POA: Diagnosis not present

## 2018-02-27 ENCOUNTER — Other Ambulatory Visit (INDEPENDENT_AMBULATORY_CARE_PROVIDER_SITE_OTHER): Payer: Self-pay | Admitting: Orthopedic Surgery

## 2018-04-30 ENCOUNTER — Other Ambulatory Visit (INDEPENDENT_AMBULATORY_CARE_PROVIDER_SITE_OTHER): Payer: Self-pay | Admitting: Orthopedic Surgery

## 2018-06-28 ENCOUNTER — Other Ambulatory Visit (INDEPENDENT_AMBULATORY_CARE_PROVIDER_SITE_OTHER): Payer: Self-pay | Admitting: Orthopedic Surgery

## 2018-06-28 NOTE — Telephone Encounter (Signed)
Ok for refill? 

## 2018-07-03 NOTE — Telephone Encounter (Signed)
Y thx

## 2018-07-30 ENCOUNTER — Other Ambulatory Visit (INDEPENDENT_AMBULATORY_CARE_PROVIDER_SITE_OTHER): Payer: Self-pay | Admitting: Orthopedic Surgery

## 2018-07-30 NOTE — Telephone Encounter (Signed)
y

## 2018-07-30 NOTE — Telephone Encounter (Signed)
[place] for refill? Any additional refills?

## 2018-08-23 DIAGNOSIS — I6529 Occlusion and stenosis of unspecified carotid artery: Secondary | ICD-10-CM | POA: Diagnosis not present

## 2018-08-23 DIAGNOSIS — Z85048 Personal history of other malignant neoplasm of rectum, rectosigmoid junction, and anus: Secondary | ICD-10-CM | POA: Diagnosis not present

## 2018-08-23 DIAGNOSIS — R718 Other abnormality of red blood cells: Secondary | ICD-10-CM | POA: Diagnosis not present

## 2018-08-23 DIAGNOSIS — I350 Nonrheumatic aortic (valve) stenosis: Secondary | ICD-10-CM | POA: Diagnosis not present

## 2018-08-23 DIAGNOSIS — F419 Anxiety disorder, unspecified: Secondary | ICD-10-CM | POA: Diagnosis not present

## 2018-08-23 DIAGNOSIS — I10 Essential (primary) hypertension: Secondary | ICD-10-CM | POA: Diagnosis not present

## 2018-08-23 DIAGNOSIS — R195 Other fecal abnormalities: Secondary | ICD-10-CM | POA: Diagnosis not present

## 2018-08-23 DIAGNOSIS — E039 Hypothyroidism, unspecified: Secondary | ICD-10-CM | POA: Diagnosis not present

## 2018-08-23 DIAGNOSIS — I509 Heart failure, unspecified: Secondary | ICD-10-CM | POA: Diagnosis not present

## 2018-08-27 ENCOUNTER — Other Ambulatory Visit (INDEPENDENT_AMBULATORY_CARE_PROVIDER_SITE_OTHER): Payer: Self-pay | Admitting: Orthopedic Surgery

## 2018-08-27 NOTE — Telephone Encounter (Signed)
Not seen since 12/2016. Ok to rf?

## 2018-08-27 NOTE — Telephone Encounter (Signed)
Ok to rf x 1 but needs f u appt pls clal thx

## 2018-09-05 DIAGNOSIS — Z961 Presence of intraocular lens: Secondary | ICD-10-CM | POA: Diagnosis not present

## 2018-09-05 DIAGNOSIS — H1851 Endothelial corneal dystrophy: Secondary | ICD-10-CM | POA: Diagnosis not present

## 2018-09-05 DIAGNOSIS — H1859 Other hereditary corneal dystrophies: Secondary | ICD-10-CM | POA: Diagnosis not present

## 2018-09-05 DIAGNOSIS — H401132 Primary open-angle glaucoma, bilateral, moderate stage: Secondary | ICD-10-CM | POA: Diagnosis not present

## 2018-09-05 DIAGNOSIS — D3131 Benign neoplasm of right choroid: Secondary | ICD-10-CM | POA: Diagnosis not present

## 2018-09-05 DIAGNOSIS — H26491 Other secondary cataract, right eye: Secondary | ICD-10-CM | POA: Diagnosis not present

## 2018-09-05 DIAGNOSIS — H04123 Dry eye syndrome of bilateral lacrimal glands: Secondary | ICD-10-CM | POA: Diagnosis not present

## 2018-09-28 ENCOUNTER — Other Ambulatory Visit (INDEPENDENT_AMBULATORY_CARE_PROVIDER_SITE_OTHER): Payer: Self-pay | Admitting: Orthopedic Surgery

## 2018-09-28 NOTE — Telephone Encounter (Signed)
Ok to rf? Not seen since 12/2016

## 2018-10-01 NOTE — Telephone Encounter (Signed)
y

## 2018-10-27 ENCOUNTER — Other Ambulatory Visit (INDEPENDENT_AMBULATORY_CARE_PROVIDER_SITE_OTHER): Payer: Self-pay | Admitting: Orthopedic Surgery

## 2018-10-29 NOTE — Telephone Encounter (Signed)
Ok to rf? 

## 2018-10-29 NOTE — Telephone Encounter (Signed)
y

## 2018-11-30 ENCOUNTER — Other Ambulatory Visit (INDEPENDENT_AMBULATORY_CARE_PROVIDER_SITE_OTHER): Payer: Self-pay | Admitting: Orthopedic Surgery

## 2018-12-24 ENCOUNTER — Other Ambulatory Visit (INDEPENDENT_AMBULATORY_CARE_PROVIDER_SITE_OTHER): Payer: Self-pay | Admitting: Orthopedic Surgery

## 2019-01-09 DIAGNOSIS — H0102A Squamous blepharitis right eye, upper and lower eyelids: Secondary | ICD-10-CM | POA: Diagnosis not present

## 2019-01-09 DIAGNOSIS — H1851 Endothelial corneal dystrophy: Secondary | ICD-10-CM | POA: Diagnosis not present

## 2019-01-09 DIAGNOSIS — H04123 Dry eye syndrome of bilateral lacrimal glands: Secondary | ICD-10-CM | POA: Diagnosis not present

## 2019-01-09 DIAGNOSIS — H401132 Primary open-angle glaucoma, bilateral, moderate stage: Secondary | ICD-10-CM | POA: Diagnosis not present

## 2019-01-09 DIAGNOSIS — H16143 Punctate keratitis, bilateral: Secondary | ICD-10-CM | POA: Diagnosis not present

## 2019-01-09 DIAGNOSIS — Z961 Presence of intraocular lens: Secondary | ICD-10-CM | POA: Diagnosis not present

## 2019-01-09 DIAGNOSIS — H0102B Squamous blepharitis left eye, upper and lower eyelids: Secondary | ICD-10-CM | POA: Diagnosis not present

## 2019-01-09 DIAGNOSIS — D3131 Benign neoplasm of right choroid: Secondary | ICD-10-CM | POA: Diagnosis not present

## 2019-01-28 ENCOUNTER — Other Ambulatory Visit (INDEPENDENT_AMBULATORY_CARE_PROVIDER_SITE_OTHER): Payer: Self-pay | Admitting: Orthopedic Surgery

## 2019-01-28 NOTE — Telephone Encounter (Signed)
Ok to rf? 

## 2019-01-29 NOTE — Telephone Encounter (Signed)
Y but she can get otc

## 2019-02-26 ENCOUNTER — Other Ambulatory Visit (INDEPENDENT_AMBULATORY_CARE_PROVIDER_SITE_OTHER): Payer: Self-pay | Admitting: Orthopedic Surgery

## 2019-03-01 DIAGNOSIS — R197 Diarrhea, unspecified: Secondary | ICD-10-CM | POA: Diagnosis not present

## 2019-03-06 DIAGNOSIS — R197 Diarrhea, unspecified: Secondary | ICD-10-CM | POA: Diagnosis not present

## 2019-03-06 DIAGNOSIS — E611 Iron deficiency: Secondary | ICD-10-CM | POA: Diagnosis not present

## 2019-03-18 DIAGNOSIS — R195 Other fecal abnormalities: Secondary | ICD-10-CM | POA: Diagnosis not present

## 2019-03-28 ENCOUNTER — Other Ambulatory Visit (INDEPENDENT_AMBULATORY_CARE_PROVIDER_SITE_OTHER): Payer: Self-pay | Admitting: Orthopedic Surgery

## 2019-03-29 DIAGNOSIS — Z23 Encounter for immunization: Secondary | ICD-10-CM | POA: Diagnosis not present

## 2019-04-29 ENCOUNTER — Other Ambulatory Visit (INDEPENDENT_AMBULATORY_CARE_PROVIDER_SITE_OTHER): Payer: Self-pay | Admitting: Orthopedic Surgery

## 2019-05-18 ENCOUNTER — Observation Stay (HOSPITAL_COMMUNITY): Payer: Medicare Other

## 2019-05-18 ENCOUNTER — Encounter (HOSPITAL_COMMUNITY): Payer: Self-pay | Admitting: Internal Medicine

## 2019-05-18 ENCOUNTER — Observation Stay (HOSPITAL_COMMUNITY)
Admission: EM | Admit: 2019-05-18 | Discharge: 2019-05-19 | Disposition: A | Discharge status: Home / Self Care | Payer: Medicare Other | Attending: Internal Medicine | Admitting: Internal Medicine

## 2019-05-18 ENCOUNTER — Other Ambulatory Visit: Payer: Self-pay

## 2019-05-18 ENCOUNTER — Emergency Department (HOSPITAL_COMMUNITY): Payer: Medicare Other

## 2019-05-18 DIAGNOSIS — N183 Chronic kidney disease, stage 3 unspecified: Secondary | ICD-10-CM | POA: Insufficient documentation

## 2019-05-18 DIAGNOSIS — I639 Cerebral infarction, unspecified: Secondary | ICD-10-CM | POA: Diagnosis present

## 2019-05-18 DIAGNOSIS — D509 Iron deficiency anemia, unspecified: Secondary | ICD-10-CM | POA: Diagnosis not present

## 2019-05-18 DIAGNOSIS — I6523 Occlusion and stenosis of bilateral carotid arteries: Secondary | ICD-10-CM | POA: Diagnosis not present

## 2019-05-18 DIAGNOSIS — Z20828 Contact with and (suspected) exposure to other viral communicable diseases: Secondary | ICD-10-CM | POA: Insufficient documentation

## 2019-05-18 DIAGNOSIS — N1831 Chronic kidney disease, stage 3a: Secondary | ICD-10-CM | POA: Diagnosis not present

## 2019-05-18 DIAGNOSIS — Z79899 Other long term (current) drug therapy: Secondary | ICD-10-CM | POA: Insufficient documentation

## 2019-05-18 DIAGNOSIS — I447 Left bundle-branch block, unspecified: Secondary | ICD-10-CM | POA: Diagnosis not present

## 2019-05-18 DIAGNOSIS — E039 Hypothyroidism, unspecified: Secondary | ICD-10-CM | POA: Diagnosis not present

## 2019-05-18 DIAGNOSIS — Z853 Personal history of malignant neoplasm of breast: Secondary | ICD-10-CM

## 2019-05-18 DIAGNOSIS — R42 Dizziness and giddiness: Secondary | ICD-10-CM

## 2019-05-18 DIAGNOSIS — G459 Transient cerebral ischemic attack, unspecified: Principal | ICD-10-CM

## 2019-05-18 DIAGNOSIS — K219 Gastro-esophageal reflux disease without esophagitis: Secondary | ICD-10-CM | POA: Diagnosis present

## 2019-05-18 DIAGNOSIS — N179 Acute kidney failure, unspecified: Secondary | ICD-10-CM | POA: Diagnosis present

## 2019-05-18 DIAGNOSIS — Z85038 Personal history of other malignant neoplasm of large intestine: Secondary | ICD-10-CM | POA: Diagnosis present

## 2019-05-18 DIAGNOSIS — R001 Bradycardia, unspecified: Secondary | ICD-10-CM | POA: Diagnosis not present

## 2019-05-18 DIAGNOSIS — I959 Hypotension, unspecified: Secondary | ICD-10-CM | POA: Diagnosis not present

## 2019-05-18 DIAGNOSIS — D508 Other iron deficiency anemias: Secondary | ICD-10-CM | POA: Diagnosis not present

## 2019-05-18 DIAGNOSIS — R0902 Hypoxemia: Secondary | ICD-10-CM | POA: Diagnosis not present

## 2019-05-18 DIAGNOSIS — R197 Diarrhea, unspecified: Secondary | ICD-10-CM | POA: Diagnosis not present

## 2019-05-18 LAB — PROTIME-INR
INR: 1 (ref 0.8–1.2)
Prothrombin Time: 13.5 seconds (ref 11.4–15.2)

## 2019-05-18 LAB — COMPREHENSIVE METABOLIC PANEL
ALT: 13 U/L (ref 0–44)
AST: 20 U/L (ref 15–41)
Albumin: 3.6 g/dL (ref 3.5–5.0)
Alkaline Phosphatase: 50 U/L (ref 38–126)
Anion gap: 7 (ref 5–15)
BUN: 14 mg/dL (ref 8–23)
CO2: 23 mmol/L (ref 22–32)
Calcium: 9.6 mg/dL (ref 8.9–10.3)
Chloride: 107 mmol/L (ref 98–111)
Creatinine, Ser: 1.08 mg/dL — ABNORMAL HIGH (ref 0.44–1.00)
GFR calc Af Amer: 52 mL/min — ABNORMAL LOW (ref >60)
GFR calc non Af Amer: 45 mL/min — ABNORMAL LOW (ref >60)
Glucose, Bld: 116 mg/dL — ABNORMAL HIGH (ref 70–99)
Potassium: 4.4 mmol/L (ref 3.5–5.1)
Sodium: 137 mmol/L (ref 135–145)
Total Bilirubin: 0.7 mg/dL (ref 0.3–1.2)
Total Protein: 6.8 g/dL (ref 6.5–8.1)

## 2019-05-18 LAB — CBC
HCT: 38.9 % (ref 36.0–46.0)
Hemoglobin: 11.1 g/dL — ABNORMAL LOW (ref 12.0–15.0)
MCH: 23.7 pg — ABNORMAL LOW (ref 26.0–34.0)
MCHC: 28.5 g/dL — ABNORMAL LOW (ref 30.0–36.0)
MCV: 82.9 fL (ref 80.0–100.0)
Platelets: 230 10*3/uL (ref 150–400)
RBC: 4.69 MIL/uL (ref 3.87–5.11)
RDW: 18.8 % — ABNORMAL HIGH (ref 11.5–15.5)
WBC: 7.7 10*3/uL (ref 4.0–10.5)
nRBC: 0 % (ref 0.0–0.2)

## 2019-05-18 LAB — CBG MONITORING, ED
Glucose-Capillary: 108 mg/dL — ABNORMAL HIGH (ref 70–99)
Glucose-Capillary: 103 mg/dL — ABNORMAL HIGH (ref 70–99)

## 2019-05-18 LAB — DIFFERENTIAL
Abs Immature Granulocytes: 0.05 10*3/uL (ref 0.00–0.07)
Basophils Absolute: 0 10*3/uL (ref 0.0–0.1)
Basophils Relative: 1 %
Eosinophils Absolute: 0.2 10*3/uL (ref 0.0–0.5)
Eosinophils Relative: 2 %
Immature Granulocytes: 1 %
Lymphocytes Relative: 25 %
Lymphs Abs: 1.9 10*3/uL (ref 0.7–4.0)
Monocytes Absolute: 1 10*3/uL (ref 0.1–1.0)
Monocytes Relative: 13 %
Neutro Abs: 4.5 10*3/uL (ref 1.7–7.7)
Neutrophils Relative %: 58 %

## 2019-05-18 LAB — SARS CORONAVIRUS 2 (TAT 6-24 HRS): SARS Coronavirus 2: NEGATIVE

## 2019-05-18 LAB — APTT: aPTT: 34 seconds (ref 24–36)

## 2019-05-18 MED ORDER — HYPROMELLOSE (GONIOSCOPIC) 2.5 % OP SOLN
1.0000 [drp] | OPHTHALMIC | Status: DC
  Administered 2019-05-18 – 2019-05-19 (×4): 1 [drp] via OPHTHALMIC
  Filled 2019-05-18: qty 15

## 2019-05-18 MED ORDER — POLYETHYL GLYCOL-PROPYL GLYCOL 0.4-0.3 % OP SOLN: 1.0000 [drp] | OPHTHALMIC | Status: DC

## 2019-05-18 MED ORDER — ACETAMINOPHEN 160 MG/5ML PO SOLN: 650.0000 mg | ORAL | Status: DC | PRN

## 2019-05-18 MED ORDER — ACETAMINOPHEN 325 MG PO TABS: 650.0000 mg | ORAL_TABLET | ORAL | Status: DC | PRN

## 2019-05-18 MED ORDER — ENOXAPARIN SODIUM 40 MG/0.4ML ~~LOC~~ SOLN
40.0000 mg | SUBCUTANEOUS | Status: DC
  Administered 2019-05-18: 40 mg via SUBCUTANEOUS
  Filled 2019-05-18: qty 0.4

## 2019-05-18 MED ORDER — DICLOFENAC SODIUM 1 % EX GEL
2.0000 g | Freq: Two times a day (BID) | CUTANEOUS | Status: DC
  Administered 2019-05-19: 2 g via TOPICAL
  Filled 2019-05-18: qty 100

## 2019-05-18 MED ORDER — IOHEXOL 350 MG/ML SOLN
75.0000 mL | Freq: Once | INTRAVENOUS | Status: AC | PRN
  Administered 2019-05-18: 75 mL via INTRAVENOUS

## 2019-05-18 MED ORDER — ASPIRIN 300 MG RE SUPP: 300.0000 mg | Freq: Every day | RECTAL | Status: DC

## 2019-05-18 MED ORDER — LEVOTHYROXINE SODIUM 50 MCG PO TABS
50.0000 ug | ORAL_TABLET | Freq: Every day | ORAL | Status: DC
  Administered 2019-05-19: 50 ug via ORAL
  Filled 2019-05-18: qty 1

## 2019-05-18 MED ORDER — STROKE: EARLY STAGES OF RECOVERY BOOK
Freq: Once | Status: AC
  Administered 2019-05-18: 1
  Filled 2019-05-18: qty 1

## 2019-05-18 MED ORDER — ACETAMINOPHEN 650 MG RE SUPP: 650.0000 mg | RECTAL | Status: DC | PRN

## 2019-05-18 MED ORDER — POLYETHYL GLYCOL-PROPYL GLYCOL 0.4-0.3 % OP GEL: 1.0000 "application " | Freq: Every day | OPHTHALMIC | Status: DC

## 2019-05-18 MED ORDER — SODIUM CHLORIDE 0.9 % IV SOLN
Freq: Once | INTRAVENOUS | Status: AC
  Administered 2019-05-18: 16:00:00 via INTRAVENOUS

## 2019-05-18 MED ORDER — HYDRALAZINE HCL 10 MG PO TABS
10.0000 mg | ORAL_TABLET | Freq: Four times a day (QID) | ORAL | Status: DC | PRN
  Administered 2019-05-18 – 2019-05-19 (×2): 10 mg via ORAL
  Filled 2019-05-18 (×2): qty 1

## 2019-05-18 MED ORDER — MELATONIN 3 MG PO TABS
3.0000 mg | ORAL_TABLET | Freq: Every day | ORAL | Status: DC
  Administered 2019-05-18: 3 mg via ORAL
  Filled 2019-05-18 (×2): qty 1

## 2019-05-18 MED ORDER — DICLOFENAC SODIUM 1 % TD GEL: 2.0000 g | Freq: Two times a day (BID) | TRANSDERMAL | Status: DC

## 2019-05-18 MED ORDER — PANTOPRAZOLE SODIUM 40 MG PO TBEC
40.0000 mg | DELAYED_RELEASE_TABLET | Freq: Every day | ORAL | Status: DC
  Administered 2019-05-19: 40 mg via ORAL
  Filled 2019-05-18: qty 1

## 2019-05-18 MED ORDER — HALOPERIDOL LACTATE 5 MG/ML IJ SOLN: 2.5000 mg | Freq: Once | INTRAMUSCULAR | Status: DC

## 2019-05-18 MED ORDER — ASPIRIN 325 MG PO TABS
325.0000 mg | ORAL_TABLET | Freq: Every day | ORAL | Status: DC
  Administered 2019-05-18 – 2019-05-19 (×2): 325 mg via ORAL
  Filled 2019-05-18 (×2): qty 1

## 2019-05-18 MED ORDER — SENNOSIDES-DOCUSATE SODIUM 8.6-50 MG PO TABS: 1.0000 | ORAL_TABLET | Freq: Every evening | ORAL | Status: DC | PRN
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
## 2019-05-18 NOTE — ED Triage Notes (Addendum)
Per [facility], pt from [facility] with complaint of "not feeling right in my head since this morning at 1145 and diarrhea x 3-4 days" Slurred speech noted by ems. Staff did not know if pt's speech was different. Pt has hx of mild cva x 9 years ago. Unknown if pt has deficits. VSS. No motor deficits or facial droop.

## 2019-05-18 NOTE — Progress Notes (Signed)
Pt easily reoriented and appears to be back to baseline as initial assessment.

## 2019-05-18 NOTE — ED Notes (Signed)
Pt daughter & Otilio [place] [phone]

## 2019-05-18 NOTE — ED Notes (Signed)
Patient transported to CT 

## 2019-05-18 NOTE — ED Notes (Signed)
Upon further assessment pt also endorses blurred vision x 2 weeks now. Also states she lives at [facility] although EMS stated that she came from [facility].
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
## 2019-05-18 NOTE — Progress Notes (Signed)
Pt abruptly confused,  AOx4 thinking she is in wrong room/ thinking she is at her place of residence. Pt wanting to walk to room 308, speaking of status and pictures that she owns at home.

## 2019-05-18 NOTE — ED Notes (Signed)
Got patient undress on the monitor patient is in a gown resting with call bell in reach
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
## 2019-05-19 ENCOUNTER — Observation Stay (HOSPITAL_BASED_OUTPATIENT_CLINIC_OR_DEPARTMENT_OTHER): Payer: Medicare Other

## 2019-05-19 ENCOUNTER — Observation Stay (HOSPITAL_COMMUNITY): Payer: Medicare Other

## 2019-05-19 DIAGNOSIS — G459 Transient cerebral ischemic attack, unspecified: Secondary | ICD-10-CM

## 2019-05-19 DIAGNOSIS — I370 Nonrheumatic pulmonary valve stenosis: Secondary | ICD-10-CM | POA: Diagnosis not present

## 2019-05-19 DIAGNOSIS — N1831 Chronic kidney disease, stage 3a: Secondary | ICD-10-CM | POA: Diagnosis not present

## 2019-05-19 DIAGNOSIS — I674 Hypertensive encephalopathy: Secondary | ICD-10-CM

## 2019-05-19 LAB — CBC
HCT: 33.8 % — ABNORMAL LOW (ref 36.0–46.0)
Hemoglobin: 10 g/dL — ABNORMAL LOW (ref 12.0–15.0)
MCH: 23.7 pg — ABNORMAL LOW (ref 26.0–34.0)
MCHC: 29.6 g/dL — ABNORMAL LOW (ref 30.0–36.0)
MCV: 80.1 fL (ref 80.0–100.0)
Platelets: 206 10*3/uL (ref 150–400)
RBC: 4.22 MIL/uL (ref 3.87–5.11)
RDW: 18.6 % — ABNORMAL HIGH (ref 11.5–15.5)
WBC: 5.4 10*3/uL (ref 4.0–10.5)
nRBC: 0 % (ref 0.0–0.2)

## 2019-05-19 LAB — LIPID PANEL
Cholesterol: 160 mg/dL (ref 0–200)
HDL: 61 mg/dL (ref >40)
LDL Cholesterol: 75 mg/dL (ref 0–99)
Total CHOL/HDL Ratio: 2.6 RATIO
Triglycerides: 119 mg/dL (ref <150)
VLDL: 24 mg/dL (ref 0–40)

## 2019-05-19 LAB — IRON AND TIBC
Iron: 34 ug/dL (ref 28–170)
Saturation Ratios: 9 % — ABNORMAL LOW (ref 10.4–31.8)
TIBC: 368 ug/dL (ref 250–450)
UIBC: 334 ug/dL

## 2019-05-19 LAB — C-REACTIVE PROTEIN: CRP: <0.8 mg/dL (ref <1.0)

## 2019-05-19 LAB — HEMOGLOBIN A1C
Hgb A1c MFr Bld: 6.4 % — ABNORMAL HIGH (ref 4.8–5.6)
Mean Plasma Glucose: 136.98 mg/dL

## 2019-05-19 LAB — BASIC METABOLIC PANEL
Anion gap: 11 (ref 5–15)
BUN: 12 mg/dL (ref 8–23)
CO2: 22 mmol/L (ref 22–32)
Calcium: 9.4 mg/dL (ref 8.9–10.3)
Chloride: 106 mmol/L (ref 98–111)
Creatinine, Ser: 0.84 mg/dL (ref 0.44–1.00)
GFR calc Af Amer: >60 mL/min (ref >60)
GFR calc non Af Amer: >60 mL/min (ref >60)
Glucose, Bld: 96 mg/dL (ref 70–99)
Potassium: 3.9 mmol/L (ref 3.5–5.1)
Sodium: 139 mmol/L (ref 135–145)

## 2019-05-19 LAB — ECHOCARDIOGRAM COMPLETE
Height: 67 in
Weight: 2850.11 oz

## 2019-05-19 LAB — TSH: TSH: 4.655 u[IU]/mL — ABNORMAL HIGH (ref 0.350–4.500)

## 2019-05-19 LAB — SEDIMENTATION RATE: Sed Rate: 3 mm/hr (ref 0–22)

## 2019-05-19 LAB — FERRITIN: Ferritin: 7 ng/mL — ABNORMAL LOW (ref 11–307)

## 2019-05-19 MED ORDER — AMLODIPINE BESYLATE 5 MG PO TABS: 5.0000 mg | ORAL_TABLET | Freq: Every day | ORAL | 0 refills | Status: DC

## 2019-05-19 MED ORDER — HYDRALAZINE HCL 10 MG PO TABS: 10.0000 mg | ORAL_TABLET | Freq: Four times a day (QID) | ORAL | Status: DC | PRN

## 2019-05-19 MED ORDER — METOPROLOL TARTRATE 50 MG PO TABS
50.0000 mg | ORAL_TABLET | Freq: Two times a day (BID) | ORAL | Status: DC
  Filled 2019-05-19: qty 1

## 2019-05-19 MED ORDER — AMLODIPINE BESYLATE 10 MG PO TABS
10.0000 mg | ORAL_TABLET | Freq: Every day | ORAL | Status: DC
  Administered 2019-05-19: 10 mg via ORAL
  Filled 2019-05-19: qty 1

## 2019-05-19 MED ORDER — FUROSEMIDE 40 MG PO TABS
40.0000 mg | ORAL_TABLET | Freq: Every day | ORAL | Status: DC
  Administered 2019-05-19: 40 mg via ORAL
  Filled 2019-05-19: qty 1

## 2019-05-19 MED ORDER — ISOSORBIDE MONONITRATE ER 30 MG PO TB24
30.0000 mg | ORAL_TABLET | Freq: Every day | ORAL | Status: DC
  Administered 2019-05-19: 30 mg via ORAL
  Filled 2019-05-19: qty 1

## 2019-05-19 MED ORDER — PRAVASTATIN SODIUM 10 MG PO TABS: 20.0000 mg | ORAL_TABLET | Freq: Every day | ORAL | Status: DC

## 2019-05-19 MED ORDER — ASPIRIN EC 81 MG PO TBEC: 81.0000 mg | DELAYED_RELEASE_TABLET | Freq: Every day | ORAL | Status: AC

## 2019-05-19 MED ORDER — PRAVASTATIN SODIUM 20 MG PO TABS: 20.0000 mg | ORAL_TABLET | Freq: Every day | ORAL | 0 refills | Status: DC

## 2019-05-19 MED ORDER — ISOSORBIDE MONONITRATE ER 30 MG PO TB24: 60.0000 mg | ORAL_TABLET | Freq: Every day | ORAL | Status: DC

## 2019-05-19 NOTE — TOC Transition Note (Signed)
Transition of Care [facility] - CM/SW Discharge Note   Patient Details  Name: NAMIRA HENNESY MRN: [id number] Date of Birth: 1924-10-06  Transition of Care [facility]) CM/SW Contact:  Carles Collet, RN Phone Number: 05/19/2019, 4:29 PM   Clinical Narrative:   Damaris Schooner w patient over the phone. She states that she would like to use [facility] for therapies. Adjusted order to include outpatient therapy as needed by [facility] and faxed to [phone]    Final next level of care: Home w Home Health Services Barriers to Discharge: No Barriers Identified   Patient Goals and CMS Choice Patient states their goals for this hospitalization and ongoing recovery are:: to return home      Discharge Placement                       Discharge Plan and Services                                     Social Determinants of Health (SDOH) Interventions     Readmission Risk Interventions No flowsheet data found.

## 2019-05-19 NOTE — Discharge Summary (Signed)
Physician Discharge Summary  MUKTA SVATOS [id number] DOB: [birth year] DOA: 05/18/2019  PCP: [name], MD  Admit date: 05/18/2019 Discharge date: 05/19/2019  Time spent: 35 minutes  Recommendations for Outpatient Follow-up:  1. PCP [name] in 1 week, please monitor blood pressure and tolerance of new and titrated blood pressure meds 2. [facility] in 4 weeks   Discharge Diagnoses:  TIA versus hypertensive encephalopathy   Hypothyroidism   GERD   Diarrhea   CVA (cerebral vascular accident) ([place])   Chronic kidney disease, stage III (moderate)   History of breast cancer   History of colon cancer   Hypochromic anemia   TIA (transient ischemic attack)   Discharge Condition: Stable  Diet recommendation: Low-sodium, heart  Filed Weights   05/18/19 1713  Weight: 80.8 kg    History of present illness:  Ms. [name] is a 85 y.o. female who lives at [facility] with a history of TIA (2013), rectal cancer ( 2017),HTN, CHF, breast cancer ( s/p right mastectomy), and PVD. She presented to [facility] ED with dizziness, "not feeling right in her head", cloudy vision from her right eye, gait unsteadiness, and slurred speech  Hospital Course:   Suspected hypertensive encephalopathy,, TIA cannot be ruled out -Patient presented with transient dizziness, headache and slurring of speech -Symptoms resolved prior to admission -MRI negative for acute infarct, CTA head and neck negative for large vessel occlusion -2D echocardiogram done and pending at this time -LDL is 75, started on low-dose statin -Seen by neurology who suspects her symptoms are secondary to hypertensive encephalopathy, now improved -However since we could not rule out a small TIA she will be started on aspirin 81 mg daily -No recurrence of symptoms, PT and OT eval completed home health therapy was recommended and set up at discharge -In terms of her uncontrolled hypertension we increased  the Imdur dose to 60 mg daily, continued metoprolol, started amlodipine 5 mg daily -Close follow-up with PCP in 1 week  Rest of her chronic medical problems were stable  Daughter did report some diarrhea a week ago prior to admission however this had resolved, no evidence of diarrhea for the last 48 hours   Discharge Exam: Vitals:   05/19/19 1317 05/19/19 1420  BP: (!) 179/75 (!) 160/84  Pulse: 79 88  Resp: 18   Temp:    SpO2: 95%     General: AAOx3 Cardiovascular: S1-S2/regular rate rhythm Respiratory: Clear bilaterally  Discharge Instructions   Discharge Instructions    Diet - low sodium heart healthy   Complete by: As directed    Increase activity slowly   Complete by: As directed      Allergies as of 05/19/2019      Reactions   Codeine Itching   Pregabalin Other (See Comments)   REACTION: causes nervousness      Medication List    STOP taking these medications   gabapentin 100 MG capsule Commonly known as: NEURONTIN   HYDROcodone-acetaminophen 5-325 MG tablet Commonly known as: Norco   LORazepam 1 MG tablet Commonly known as: Ativan   traMADol 50 MG tablet Commonly known as: ULTRAM     TAKE these medications   acetaminophen 500 MG tablet Commonly known as: TYLENOL Take 1,000 mg by mouth every 6 (six) hours as needed for headache (pain).   amLODipine 5 MG tablet Commonly known as: NORVASC Take 1 tablet (5 mg total) by mouth daily.   aspirin EC 81 MG tablet Take 1 tablet (  81 mg total) by mouth daily.   diclofenac sodium 1 % Gel Commonly known as: VOLTAREN APPLY 2-4 GRAMS TO AFFECTED AREA 2-3 TIMES A DAY AS NEEDED. What changed: See the new instructions.   esomeprazole 40 MG capsule Commonly known as: NexIUM Take 1 capsule (40 mg total) by mouth daily before breakfast.   furosemide 40 MG tablet Commonly known as: LASIX Take 40 mg by mouth daily.   isosorbide mononitrate 30 MG 24 hr tablet Commonly known as: IMDUR Take 2 tablets (60 mg  total) by mouth daily. What changed: how much to take   levothyroxine 50 MCG tablet Commonly known as: SYNTHROID Take 50 mcg by mouth daily. What changed: Another medication with the same name was removed. Continue taking this medication, and follow the directions you see here.   Melatonin 3 MG Tabs Take 3 mg by mouth at bedtime.   metoprolol tartrate 50 MG tablet Commonly known as: LOPRESSOR Take 50 mg by mouth 2 (two) times daily.   potassium chloride SA 20 MEQ tablet Commonly known as: Klor-Con M20 Take 1 tablet (20 mEq total) by mouth daily.   pravastatin 20 MG tablet Commonly known as: PRAVACHOL Take 1 tablet (20 mg total) by mouth daily at 6 PM.   Systane 0.4-0.3 % Soln Generic drug: Polyethyl Glycol-Propyl Glycol Place 1 drop into both eyes every 2 (two) hours.   Systane 0.4-0.3 % Gel ophthalmic gel Generic drug: Polyethyl Glycol-Propyl Glycol Place 1 application into both eyes at bedtime.      Allergies  Allergen Reactions  . Codeine Itching  . Pregabalin Other (See Comments)    REACTION: causes nervousness   Follow-up Information    [facility]. Schedule an appointment as soon as possible for a visit in 4 week(s).   Specialty: Neurology Contact information: [address] [place] [phone]       [name], MD. Schedule an appointment as soon as possible for a visit in 1 week(s).   Specialty: Internal Medicine Contact information: [address] [place]  [id number] [phone]            The results of significant diagnostics from this hospitalization (including imaging, microbiology, ancillary and laboratory) are listed below for reference.    Significant Diagnostic Studies: Ct Angio Head W Or Wo Contrast  Result Date: 05/18/2019 CLINICAL DATA:  Initial evaluation for acute dizziness, speech difficulty. EXAM: CT ANGIOGRAPHY HEAD AND NECK TECHNIQUE:  Multidetector CT imaging of the head and neck was performed using the standard protocol during bolus administration of intravenous contrast. Multiplanar CT image reconstructions and MIPs were obtained to evaluate the vascular anatomy. Carotid stenosis measurements (when applicable) are obtained utilizing NASCET criteria, using the distal internal carotid diameter as the denominator. CONTRAST:  33mL OMNIPAQUE IOHEXOL 350 MG/ML SOLN COMPARISON:  Prior CT from earlier same day. FINDINGS: CTA NECK FINDINGS Aortic arch: Visualized aortic arch of normal caliber with normal 3 vessel morphology. Moderate atherosclerotic change about the arch and origin of the great vessels without hemodynamically significant stenosis. Visible subclavian arteries widely patent. Right carotid system: Right CCA patent from its origin to the bifurcation without stenosis. Calcified plaque about the right bifurcation without hemodynamically significant stenosis. Right ICA widely patent distally to the skull base without stenosis or other vascular abnormality. Left carotid system: Left CCA patent from its origin to the bifurcation without stenosis. Calcified plaque about the left bifurcation with no more than mild 30% stenosis by NASCET criteria.  Left ICA patent distally to the skull base without stenosis or other acute vascular abnormality. Vertebral arteries: Both vertebral arteries arise from the subclavian arteries. Vertebral arteries patent within the neck without stenosis or other acute vascular abnormality. Skeleton: No acute osseous abnormality. No worrisome osseous lesions. Moderate to advanced multilevel cervical spondylolysis noted. Other neck: No other significant soft tissue abnormality within the neck. Upper chest: Trace layering bilateral pleural effusions with mild pulmonary interstitial edema noted within the visualized lungs. 8 mm soft tissue nodule noted at the superior left breast, partially visualized, of doubtful significance  given advanced age. Visualized upper chest demonstrates no other acute finding. Review of the MIP images confirms the above findings CTA HEAD FINDINGS Anterior circulation: Petrous segments widely patent bilaterally. Scattered calcified atheromatous plaque within the cavernous/supraclinoid ICAs bilaterally with resultant mild-to-moderate diffuse narrowing, slightly worse on the left. A1 segments patent bilaterally. Normal anterior communicating artery. Anterior cerebral arteries widely patent to their distal aspects without stenosis. Mild atheromatous irregularity within the M1 segments bilaterally without significant stenosis or occlusion. Normal MCA bifurcations. Distal MCA branches well perfused and symmetric. Posterior circulation: Focal atheromatous plaque within the proximal V4 segments bilaterally with associated moderate approximate 50% stenosis. Vertebral arteries otherwise widely patent to vertebrobasilar junction. Posterior inferior cerebral arteries patent bilaterally. Basilar widely patent to its distal aspect without stenosis. Superior cerebral arteries patent bilaterally. Both PCAs primarily supplied via the basilar. Right PCA widely patent to its distal aspect. Short-segment severe distal left P3 stenosis noted. Left PCA otherwise patent. Venous sinuses: Grossly patent allowing for timing the contrast bolus. Anatomic variants: None significant. No intracranial aneurysm. Review of the MIP images confirms the above findings IMPRESSION: 1. Negative CTA for emergent large vessel occlusion. 2. Moderate atheromatous plaque about the carotid bifurcations and carotid siphons without hemodynamically significant stenosis. 3. Moderate approximate 50% stenoses involving the V4 segments bilaterally. 4. Mild pulmonary interstitial edema with trace layering bilateral pleural effusions. Electronically Signed   By: Jeannine Boga M.D.   On: 05/18/2019 22:24   Ct Head Wo Contrast  Result Date:  05/18/2019 CLINICAL DATA:  Altered mental status and slurred speech. EXAM: CT HEAD WITHOUT CONTRAST TECHNIQUE: Contiguous axial images were obtained from the base of the skull through the vertex without intravenous contrast. COMPARISON:  05/19/2015. FINDINGS: Brain: Mild to moderate enlargement of the ventricles and subarachnoid spaces with little change. Stable mild patchy white matter low density in both cerebral hemispheres. No intracranial hemorrhage, mass lesion or CT evidence of acute infarction. Vascular: No hyperdense vessel or unexpected calcification. Skull: Mild bilateral hyperostosis frontalis. Sinuses/Orbits: Status post bilateral cataract extraction. Unremarkable bones and included paranasal sinuses. Other: None. IMPRESSION: 1. No acute abnormality. 2. Stable mild to moderate diffuse cerebral and cerebellar atrophy and mild chronic small vessel white matter ischemic changes in both cerebral hemispheres. Electronically Signed   By: Claudie Revering M.D.   On: 05/18/2019 13:15   Ct Angio Neck W Or Wo Contrast  Result Date: 05/18/2019 CLINICAL DATA:  Initial evaluation for acute dizziness, speech difficulty. EXAM: CT ANGIOGRAPHY HEAD AND NECK TECHNIQUE: Multidetector CT imaging of the head and neck was performed using the standard protocol during bolus administration of intravenous contrast. Multiplanar CT image reconstructions and MIPs were obtained to evaluate the vascular anatomy. Carotid stenosis measurements (when applicable) are obtained utilizing NASCET criteria, using the distal internal carotid diameter as the denominator. CONTRAST:  3mL OMNIPAQUE IOHEXOL 350 MG/ML SOLN COMPARISON:  Prior CT from earlier same day. FINDINGS: CTA NECK FINDINGS Aortic  arch: Visualized aortic arch of normal caliber with normal 3 vessel morphology. Moderate atherosclerotic change about the arch and origin of the great vessels without hemodynamically significant stenosis. Visible subclavian arteries widely patent.  Right carotid system: Right CCA patent from its origin to the bifurcation without stenosis. Calcified plaque about the right bifurcation without hemodynamically significant stenosis. Right ICA widely patent distally to the skull base without stenosis or other vascular abnormality. Left carotid system: Left CCA patent from its origin to the bifurcation without stenosis. Calcified plaque about the left bifurcation with no more than mild 30% stenosis by NASCET criteria. Left ICA patent distally to the skull base without stenosis or other acute vascular abnormality. Vertebral arteries: Both vertebral arteries arise from the subclavian arteries. Vertebral arteries patent within the neck without stenosis or other acute vascular abnormality. Skeleton: No acute osseous abnormality. No worrisome osseous lesions. Moderate to advanced multilevel cervical spondylolysis noted. Other neck: No other significant soft tissue abnormality within the neck. Upper chest: Trace layering bilateral pleural effusions with mild pulmonary interstitial edema noted within the visualized lungs. 8 mm soft tissue nodule noted at the superior left breast, partially visualized, of doubtful significance given advanced age. Visualized upper chest demonstrates no other acute finding. Review of the MIP images confirms the above findings CTA HEAD FINDINGS Anterior circulation: Petrous segments widely patent bilaterally. Scattered calcified atheromatous plaque within the cavernous/supraclinoid ICAs bilaterally with resultant mild-to-moderate diffuse narrowing, slightly worse on the left. A1 segments patent bilaterally. Normal anterior communicating artery. Anterior cerebral arteries widely patent to their distal aspects without stenosis. Mild atheromatous irregularity within the M1 segments bilaterally without significant stenosis or occlusion. Normal MCA bifurcations. Distal MCA branches well perfused and symmetric. Posterior circulation: Focal  atheromatous plaque within the proximal V4 segments bilaterally with associated moderate approximate 50% stenosis. Vertebral arteries otherwise widely patent to vertebrobasilar junction. Posterior inferior cerebral arteries patent bilaterally. Basilar widely patent to its distal aspect without stenosis. Superior cerebral arteries patent bilaterally. Both PCAs primarily supplied via the basilar. Right PCA widely patent to its distal aspect. Short-segment severe distal left P3 stenosis noted. Left PCA otherwise patent. Venous sinuses: Grossly patent allowing for timing the contrast bolus. Anatomic variants: None significant. No intracranial aneurysm. Review of the MIP images confirms the above findings IMPRESSION: 1. Negative CTA for emergent large vessel occlusion. 2. Moderate atheromatous plaque about the carotid bifurcations and carotid siphons without hemodynamically significant stenosis. 3. Moderate approximate 50% stenoses involving the V4 segments bilaterally. 4. Mild pulmonary interstitial edema with trace layering bilateral pleural effusions. Electronically Signed   By: Jeannine Boga M.D.   On: 05/18/2019 22:24   Mr Brain Wo Contrast  Result Date: 05/19/2019 CLINICAL DATA:  Stroke. History of breast cancer and colon cancer. Dizziness. EXAM: MRI HEAD WITHOUT CONTRAST TECHNIQUE: Multiplanar, multiecho pulse sequences of the brain and surrounding structures were obtained without intravenous contrast. COMPARISON:  CTA head 05/18/2019 FINDINGS: Brain: Negative for acute infarct. Chronic ischemic changes in the white matter bilaterally. Chronic infarct right thalamus. Small chronic infarct right cerebellum. Generalized atrophy without hydrocephalus. Chronic microhemorrhage left cerebellum. Small area of chronic hemorrhage left parietal lobe. No mass lesion identified. Vascular: Normal arterial flow voids Skull and upper cervical spine: Advanced arthropathy at C1-2 with pannus formation and mild to  moderate spinal stenosis. Cervical spondylosis is present C2-3 C3-4 and C4-5. No bone lesion identified. Sinuses/Orbits: Paranasal sinuses clear.  Bilateral cataract surgery Other: None IMPRESSION: 1. Negative for acute infarct 2. Atrophy and chronic ischemic change. Chronic microhemorrhage  left cerebellum and chronic hemorrhage left parietal lobe. 3. Degenerative changes in cervical spine. Prominent C1-2 arthropathy with pannus formation and mild-to-moderate spinal stenosis at the C1 level. Electronically Signed   By: Franchot Gallo M.D.   On: 05/19/2019 12:44    Microbiology: Recent Results (from the past 240 hour(s))  SARS CORONAVIRUS 2 (TAT 6-24 HRS) Nasopharyngeal Nasopharyngeal Swab     Status: None   Collection Time: 05/18/19  3:59 PM   Specimen: Nasopharyngeal Swab  Result Value Ref Range Status   SARS Coronavirus 2 NEGATIVE NEGATIVE Final    Comment: (NOTE) SARS-CoV-2 target nucleic acids are NOT DETECTED. The SARS-CoV-2 RNA is generally detectable in upper and lower respiratory specimens during the acute phase of infection. Negative results do not preclude SARS-CoV-2 infection, do not rule out co-infections with other pathogens, and should not be used as the sole basis for treatment or other patient management decisions. Negative results must be combined with clinical observations, patient history, and epidemiological information. The expected result is Negative. Fact Sheet for Patients: SugarRoll.be Fact Sheet for Healthcare Providers: https://www.woods-mathews.com/ This test is not yet approved or cleared by the [place] FDA and  has been authorized for detection and/or diagnosis of SARS-CoV-2 by FDA under an Emergency Use Authorization (EUA). This EUA will remain  in effect (meaning this test can be used) for the duration of the COVID-19 declaration under Section 56 4(b)(1) of the Act, 21 U.S.C. section 360bbb-3(b)(1), unless the  authorization is terminated or revoked sooner. Performed at [facility], [place] [address] [place], [place] [address]      Labs: Basic Metabolic Panel: Recent Labs  Lab 05/18/19 1257 05/19/19 0323  NA 137 139  K 4.4 3.9  CL 107 106  CO2 23 22  GLUCOSE 116* 96  BUN 14 12  CREATININE 1.08* 0.84  CALCIUM 9.6 9.4   Liver Function Tests: Recent Labs  Lab 05/18/19 1257  AST 20  ALT 13  ALKPHOS 50  BILITOT 0.7  PROT 6.8  ALBUMIN 3.6   No results for input(s): LIPASE, AMYLASE in the last 168 hours. No results for input(s): AMMONIA in the last 168 hours. CBC: Recent Labs  Lab 05/18/19 1257 05/19/19 0323  WBC 7.7 5.4  NEUTROABS 4.5  --   HGB 11.1* 10.0*  HCT 38.9 33.8*  MCV 82.9 80.1  PLT 230 206   Cardiac Enzymes: No results for input(s): CKTOTAL, CKMB, CKMBINDEX, TROPONINI in the last 168 hours. BNP: BNP (last 3 results) No results for input(s): BNP in the last 8760 hours.  ProBNP (last 3 results) No results for input(s): PROBNP in the last 8760 hours.  CBG: Recent Labs  Lab 05/18/19 1245 05/18/19 1347  GLUCAP 108* 103*       Signed:  Domenic Polite MD.  [facility] 05/19/2019, 2:27 PM

## 2019-05-19 NOTE — Progress Notes (Signed)
AVS reviewed with patient and patient given a copy to take home along with a copy of printed prescriptions. All lines removed, patient dressed, belongings packed, and patient taken by nurse via wheelchair to daughter's car for discharge.

## 2019-05-19 NOTE — Plan of Care (Signed)
  Problem: Education: Goal: Knowledge of General Education information will improve Description: Including pain rating scale, medication(s)/side effects and non-pharmacologic comfort measures Outcome: Progressing   Problem: Activity: Goal: Risk for activity intolerance will decrease Outcome: Progressing   Problem: Nutrition: Goal: Adequate nutrition will be maintained Outcome: Progressing   Problem: Coping: Goal: Level of anxiety will decrease Outcome: Progressing   Problem: Pain Managment: Goal: General experience of comfort will improve Outcome: Progressing   Problem: Safety: Goal: Ability to remain free from injury will improve Outcome: Progressing   Problem: Skin Integrity: Goal: Risk for impaired skin integrity will decrease Outcome: Progressing   Problem: Education: Goal: Knowledge of disease or condition will improve Outcome: Progressing Goal: Knowledge of secondary prevention will improve Outcome: Progressing Goal: Knowledge of patient specific risk factors addressed and post discharge goals established will improve Outcome: Progressing Goal: Individualized Educational Video(s) Outcome: Progressing   [name], BSN, RN

## 2019-05-19 NOTE — Progress Notes (Signed)
*  PRELIMINARY RESULTS* Echocardiogram 2D Echocardiogram has been performed.  Leavy Cella 05/19/2019, 1:55 PM
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
## 2019-05-19 NOTE — Progress Notes (Signed)
STROKE TEAM PROGRESS NOTE   INTERVAL HISTORY Her TTE tech is at the bedside.  Pt stated that she felt dizzy, lightheadedness, no vertigo yesterday along with blurry vision and eyes not able to focus. Her daughter also found her to have slurry speech. She stated that she still has some slurry speech now and not back to normal yet. She did have some intermittent word finding difficulty and paraphasic errors but self correctable.     OBJECTIVE Vitals:   05/19/19 0230 05/19/19 0530 05/19/19 0818 05/19/19 1043  BP: (!) 193/83 (!) 172/90 (!) 198/111 (!) 196/120  Pulse: 71 74 79   Resp: 18 18 16    Temp: 98 F (36.7 C) 97.8 F (36.6 C) 98.2 F (36.8 C)   TempSrc:  Oral Oral   SpO2: 98% 95% 94%   Weight:      Height:        CBC:  Recent Labs  Lab 05/18/19 1257 05/19/19 0323  WBC 7.7 5.4  NEUTROABS 4.5  --   HGB 11.1* 10.0*  HCT 38.9 33.8*  MCV 82.9 80.1  PLT 230 580    Basic Metabolic Panel:  Recent Labs  Lab 05/18/19 1257 05/19/19 0323  NA 137 139  K 4.4 3.9  CL 107 106  CO2 23 22  GLUCOSE 116* 96  BUN 14 12  CREATININE 1.08* 0.84  CALCIUM 9.6 9.4    Lipid Panel:     Component Value Date/Time   CHOL 160 05/19/2019 0323   TRIG 119 05/19/2019 0323   HDL 61 05/19/2019 0323   CHOLHDL 2.6 05/19/2019 0323   VLDL 24 05/19/2019 0323   LDLCALC 75 05/19/2019 0323   HgbA1c:  Lab Results  Component Value Date   HGBA1C 6.4 (H) 05/19/2019   Urine Drug Screen: No results found for: LABOPIA, COCAINSCRNUR, LABBENZ, AMPHETMU, THCU, LABBARB  Alcohol Level No results found for: ETH  IMAGING  Ct Angio Head W Or Wo Contrast Ct Angio Neck W Or Wo Contrast 05/18/2019 IMPRESSION:  1. Negative CTA for emergent large vessel occlusion.  2. Moderate atheromatous plaque about the carotid bifurcations and carotid siphons without hemodynamically significant stenosis.  3. Moderate approximate 50% stenoses involving the V4 segments bilaterally.  4. Mild pulmonary interstitial edema  with trace layering bilateral pleural effusions.   Ct Head Wo Contrast 05/18/2019 IMPRESSION:  1. No acute abnormality. 2. Stable mild to moderate diffuse cerebral and cerebellar atrophy and mild chronic small vessel white matter ischemic changes in both cerebral hemispheres.    MRI Brain Wo Contrast 1. Negative for acute infarct 2. Atrophy and chronic ischemic change. Chronic microhemorrhage left cerebellum and chronic hemorrhage left parietal lobe. 3. Degenerative changes in cervical spine. Prominent C1-2 arthropathy with pannus formation and mild-to-moderate spinal stenosis at the C1 level.   Transthoracic Echocardiogram  00/00/2020 Pending   ECG - SB rate 54 BPM LBBB (See cardiology reading for complete details)    PHYSICAL EXAM  Temp:  [97.7 F (36.5 C)-98.2 F (36.8 C)] 98.2 F (36.8 C) (11/15 0818) Pulse Rate:  [57-88] 79 (11/15 0818) Resp:  [16-20] 16 (11/15 0818) BP: (137-225)/(60-120) 201/93 (11/15 1207) SpO2:  [94 %-100 %] 94 % (11/15 0818) Weight:  [80.8 kg] 80.8 kg (11/14 1713)  General - Well nourished, well developed, in no apparent distress.  Ophthalmologic - fundi not visualized due to noncooperation.  Cardiovascular - Regular rhythm and rate.  Mental Status -  Level of arousal and orientation to time, place, and person were intact. Language  including naming, repetition, comprehension was assessed and found intact. Occasional paraphasic errors and word finding difficulty, but self correctable. Slight dysarthria Fund of Knowledge was assessed and was intact.  Cranial Nerves II - XII - II - Visual field intact OU. III, IV, VI - Extraocular movements intact. V - Facial sensation intact bilaterally. VII - Facial movement intact bilaterally but slight right nasolabial fold flattening. VIII - Hearing & vestibular intact bilaterally. X - Palate elevates symmetrically, slight dysarthria. XI - Chin turning & shoulder shrug intact bilaterally. XII -  Tongue protrusion intact.  Motor Strength - [name] strength was symmetrical in all extremities and pronator drift was absent.  Bulk was normal and fasciculations were absent.   Motor Tone - Muscle tone was assessed at the neck and appendages and was normal.  Reflexes - [name] reflexes were symmetrical in all extremities and she had no pathological reflexes.  Sensory - Light touch, temperature/pinprick were assessed and were symmetrical.    Coordination - The patient had normal movements in the hands and feet with no ataxia or dysmetria.  Tremor was absent.  Gait and Station - deferred.   ASSESSMENT/PLAN Ms. KENDA KLOEHN is a 85 y.o. female who lives at [facility] with a history of TIA (2013), rectal cancer ( 2017),HTN, CHF, breast cancer ( s/p right mastectomy), and PVD. She presented to [facility] ED with dizziness, "not feeling right in her head", cloudy vision from her right eye, gait unsteadiness, and slurred speech. She did not receive IV t-PA due to resolution of deficits.  Possible hypertensive encephalopathy, however, TIA can not be ruled out  CT head - No acute abnormality. Stable mild to moderate diffuse cerebral and cerebellar atrophy and mild chronic small vessel white matter ischemic changes in both cerebral hemispheres.   MRI head - no acute infarct  CTA H&N - Negative CTA for emergent large vessel occlusion. B/l V4 50% stenosis, b.l ICA bulb and siphon mild atherosclerosis  2D Echo - pending  Sars Corona Virus 2 - negative  LDL - 75  HgbA1c - 6.4  CRP/ESR WNL  VTE prophylaxis - Lovenox  No antithrombotic prior to admission, now on aspirin 325 mg daily. Recommend ASA 49m on discharge.   Patient counseled to be compliant with her antithrombotic medications  Ongoing aggressive stroke risk factor management  Therapy recommendations:  HH OT  Disposition:  Pending  Hypertensive urgency  Home BP meds: metoprolol ; lasix:  imdur  Current BP meds: metoprolol, lasix, imdur  BP elevated  . Gradually normalized BP in 3-5 days, no BP reduction > 25% in first 24 hours.  . Long-term BP goal normotensive  Hyperlipidemia  Home Lipid lowering medication: none   LDL 75, goal < 70  Current lipid lowering medication: Pravastatin 20 mg daily  Continue statin at discharge  Other Stroke Risk Factors  Advanced age   Hx of TIA 2013 - no details   CHF  PVD  Other Active Problems  Breast Ca s/p Sx  Rectal Ca 2017   Hospital day # 0  Neurology will sign off. Please call with questions. [facility] Thanks for the consult.  JRosalin Hawking MD PhD Stroke Neurology 05/19/2019 12:40 PM   To contact Stroke Continuity provider, please refer to [email or web address] After hours, contact General Neurology

## 2019-05-19 NOTE — Evaluation (Signed)
Occupational Therapy Evaluation Patient Details Name: [name] MRN: [id number] DOB: 06-28-1925 Today's Date: 05/19/2019    History of Present Illness 85 yo female that requires RW from [facility] with slurred speech, diarrhea 3-4 days, episodes of blurred vision and dizziness CT negative PMH: CVA, breast CA, colon cA, congestive hear failure, HTN, DJD, low back pain  Hip fx   Clinical Impression   Patient evaluated by Occupational Therapy with no further acute OT needs identified. All education has been completed and the patient has no further questions. See below for any follow-up Occupational Therapy or equipment needs. OT to sign off. Thank you for referral.      Follow Up Recommendations  Home health OT    Equipment Recommendations  None recommended by OT    Recommendations for Other Services       Precautions / Restrictions Precautions Precautions: Fall      Mobility Bed Mobility Overal bed mobility: Modified Independent                Transfers Overall transfer level: Modified independent Equipment used: Rolling walker (2 wheeled)                  Balance                                           ADL either performed or assessed with clinical judgement   ADL Overall ADL's : Modified independent                                       General ADL Comments: able to don doff shoes, grooming task mod I  and demonstrates basic transfer, completed distance to elevator transfers     Vision Patient Visual Report: No change from baseline       Perception     Praxis      Pertinent Vitals/Pain Pain Assessment: No/denies pain     Hand Dominance Right   Extremity/Trunk Assessment Upper Extremity Assessment Upper Extremity Assessment: Overall WFL for tasks assessed   Lower Extremity Assessment Lower Extremity Assessment: Defer to PT evaluation   Cervical / Trunk Assessment Cervical / Trunk  Assessment: Kyphotic   Communication Communication Communication: No difficulties   Cognition Arousal/Alertness: Awake/alert Behavior During Therapy: WFL for tasks assessed/performed Overall Cognitive Status: Within Functional Limits for tasks assessed                                 General Comments: w   General Comments       Exercises     Shoulder Instructions      Home Living Family/patient expects to be discharged to:: Assisted living                             Home Equipment: Walker - 4 wheels   Additional Comments: lives on the 3rd floor of [facility] and walks to the elevator then down to [employer]. Pt completes all bathing and dressing MOD I.       Prior Functioning/Environment Level of Independence: Independent                 OT Problem  List: Impaired balance (sitting and/or standing)      OT Treatment/Interventions:      OT Goals(Current goals can be found in the care plan section) Acute Rehab OT Goals Patient Stated Goal: to return to [facility] OT Goal Formulation: All assessment and education complete, DC therapy  OT Frequency:     Barriers to D/C:            Co-evaluation              AM-PAC OT "6 Clicks" Daily Activity     Outcome Measure Help from another person eating meals?: None Help from another person taking care of personal grooming?: None Help from another person toileting, which includes using toliet, bedpan, or urinal?: None Help from another person bathing (including washing, rinsing, drying)?: None Help from another person to put on and taking off regular upper body clothing?: None Help from another person to put on and taking off regular lower body clothing?: None 6 Click Score: 24   End of Session Equipment Utilized During Treatment: Rolling walker Nurse Communication: Mobility status;Precautions  Activity Tolerance: Patient tolerated treatment well Patient left: in  chair;with call bell/phone within reach  OT Visit Diagnosis: Unsteadiness on feet (R26.81);Muscle weakness (generalized) (M62.81)                Time: [id number] OT Time Calculation (min): 14 min Charges:  OT General Charges $OT Visit: 1 Visit OT Evaluation $OT Eval Low Complexity: 1 Low   Brynn, OTR/L  Acute Rehabilitation Services Pager: [phone] Office: [phone] .   Jeri Modena 05/19/2019, 9:41 AM

## 2019-05-19 NOTE — Evaluation (Signed)
Physical Therapy Evaluation Patient Details Name: [name] MRN: [id number] DOB: 12-24-1924 Today's Date: 05/19/2019   History of Present Illness  85 yo female that requires RW from [facility] with slurred speech, diarrhea 3-4 days, episodes of blurred vision and dizziness CT negative PMH: CVA, breast CA, colon cA, congestive hear failure, HTN, DJD, low back pain  Hip fx    Clinical Impression  Pt presents to PT at/near her functional baseline. She is independent ambulating with RW and is able to move around and change positions unassisted.  SBP measured before amb = 164, and after amb = 184, RN notified.  No PT needs indicated currently, pt encouraged to return to active habits at ALF and see PT on site if status changes.    Follow Up Recommendations No PT follow up    Equipment Recommendations  None recommended by PT    Recommendations for Other Services       Precautions / Restrictions Precautions Precautions: Fall Restrictions Weight Bearing Restrictions: No      Mobility  Bed Mobility Overal bed mobility: Modified Independent             General bed mobility comments: HOB elevated, but pt manages bed covers and changes positions unassisted  Transfers Overall transfer level: Modified independent Equipment used: Rolling walker (2 wheeled)             General transfer comment: unassisted, pt rises to stand with RW safely  Ambulation/Gait Ambulation/Gait assistance: Modified independent (Device/Increase time) Gait Distance (Feet): 150 Feet Assistive device: Rolling walker (2 wheeled) Gait Pattern/deviations: Step-through pattern;Antalgic;Decreased weight shift to right     General Gait Details: pt has premorbid RLE pain/stiffness that worsens with prolonged bedrest, but pt states she normally manages to stay active; noted increased favoring of RLE over time as fatigue increased, but pt able to continue on wtih modifications in gait  Stairs             Wheelchair Mobility    Modified Rankin (Stroke Patients Only) Modified Rankin (Stroke Patients Only) Pre-Morbid Rankin Score: No significant disability Modified Rankin: No significant disability     Balance Overall balance assessment: Mild deficits observed, not formally tested                                           Pertinent Vitals/Pain Pain Assessment: No/denies pain    Home Living Family/patient expects to be discharged to:: Assisted living               Home Equipment: [place] Rile - 4 wheels Additional Comments: From [facility] on 3rd floor w/elevator    Prior Function Level of Independence: Independent with assistive device(s)         Comments: uses rollator, stays active, walks halls for exercise, was new resident Emergency planning/management officer and called [facility] before pandemic     Hand Dominance   Dominant Hand: Right    Extremity/Trunk Assessment   Upper Extremity Assessment Upper Extremity Assessment: Defer to OT evaluation    Lower Extremity Assessment Lower Extremity Assessment: Generalized weakness    Cervical / Trunk Assessment Cervical / Trunk Assessment: Kyphotic  Communication   Communication: HOH  Cognition Arousal/Alertness: Awake/alert Behavior During Therapy: WFL for tasks assessed/performed Overall Cognitive Status: Within Functional Limits for tasks assessed  General Comments General comments (skin integrity, edema, etc.): reports poor appetite but trying to eat anyway; BP remains high, SBP 165 prior to walk, 184 after    Exercises     Assessment/Plan    PT Assessment Patent does not need any further PT services  PT Problem List         PT Treatment Interventions      PT Goals (Current goals can be found in the Care Plan section)  Acute Rehab PT Goals Patient Stated Goal: to return to [facility]    Frequency     Barriers to discharge         Co-evaluation               AM-PAC PT "6 Clicks" Mobility  Outcome Measure Help needed turning from your back to your side while in a flat bed without using bedrails?: None Help needed moving from lying on your back to sitting on the side of a flat bed without using bedrails?: None Help needed moving to and from a bed to a chair (including a wheelchair)?: None Help needed standing up from a chair using your arms (e.g., wheelchair or bedside chair)?: None Help needed to walk in hospital room?: None Help needed climbing 3-5 steps with a railing? : A Little 6 Click Score: 23    End of Session   Activity Tolerance: Patient limited by pain Patient left: in chair;with call bell/phone within reach Nurse Communication: Mobility status PT Visit Diagnosis: Difficulty in walking, not elsewhere classified (R26.2)    Time: [id number] PT Time Calculation (min) (ACUTE ONLY): 15 min   Charges:   PT Evaluation $PT Eval Low Complexity: 1 Low          Kearney Hard, PT, DPT, MS Board Certified Geriatric Clinical Specialist   Herbie Drape 05/19/2019, 2:47 PM

## 2019-05-20 LAB — POCT I-STAT, CHEM 8
BUN: 17 mg/dL (ref 8–23)
Calcium, Ion: 1.21 mmol/L (ref 1.15–1.40)
Chloride: 107 mmol/L (ref 98–111)
Creatinine, Ser: 1 mg/dL (ref 0.44–1.00)
Glucose, Bld: 112 mg/dL — ABNORMAL HIGH (ref 70–99)
HCT: 38 % (ref 36.0–46.0)
Hemoglobin: 12.9 g/dL (ref 12.0–15.0)
Potassium: 4.3 mmol/L (ref 3.5–5.1)
Sodium: 140 mmol/L (ref 135–145)
TCO2: 23 mmol/L (ref 22–32)

## 2019-05-22 ENCOUNTER — Encounter (HOSPITAL_COMMUNITY): Payer: Self-pay | Admitting: Internal Medicine

## 2019-05-23 ENCOUNTER — Other Ambulatory Visit (INDEPENDENT_AMBULATORY_CARE_PROVIDER_SITE_OTHER): Payer: Self-pay | Admitting: Orthopedic Surgery

## 2019-05-24 DIAGNOSIS — M6281 Muscle weakness (generalized): Secondary | ICD-10-CM | POA: Diagnosis not present

## 2019-05-24 DIAGNOSIS — R2689 Other abnormalities of gait and mobility: Secondary | ICD-10-CM | POA: Diagnosis not present

## 2019-05-27 DIAGNOSIS — M6281 Muscle weakness (generalized): Secondary | ICD-10-CM | POA: Diagnosis not present

## 2019-05-28 DIAGNOSIS — M6281 Muscle weakness (generalized): Secondary | ICD-10-CM | POA: Diagnosis not present

## 2019-05-29 DIAGNOSIS — M6281 Muscle weakness (generalized): Secondary | ICD-10-CM | POA: Diagnosis not present

## 2019-06-03 DIAGNOSIS — M6281 Muscle weakness (generalized): Secondary | ICD-10-CM | POA: Diagnosis not present

## 2019-06-06 DIAGNOSIS — E039 Hypothyroidism, unspecified: Secondary | ICD-10-CM | POA: Diagnosis not present

## 2019-06-06 DIAGNOSIS — Z85048 Personal history of other malignant neoplasm of rectum, rectosigmoid junction, and anus: Secondary | ICD-10-CM | POA: Diagnosis not present

## 2019-06-06 DIAGNOSIS — R197 Diarrhea, unspecified: Secondary | ICD-10-CM | POA: Diagnosis not present

## 2019-06-06 DIAGNOSIS — I519 Heart disease, unspecified: Secondary | ICD-10-CM | POA: Diagnosis not present

## 2019-06-06 DIAGNOSIS — R413 Other amnesia: Secondary | ICD-10-CM | POA: Diagnosis not present

## 2019-06-06 DIAGNOSIS — F419 Anxiety disorder, unspecified: Secondary | ICD-10-CM | POA: Diagnosis not present

## 2019-06-06 DIAGNOSIS — I509 Heart failure, unspecified: Secondary | ICD-10-CM | POA: Diagnosis not present

## 2019-06-06 DIAGNOSIS — R4182 Altered mental status, unspecified: Secondary | ICD-10-CM | POA: Diagnosis not present

## 2019-06-06 DIAGNOSIS — I6529 Occlusion and stenosis of unspecified carotid artery: Secondary | ICD-10-CM | POA: Diagnosis not present

## 2019-06-06 DIAGNOSIS — Z79899 Other long term (current) drug therapy: Secondary | ICD-10-CM | POA: Diagnosis not present

## 2019-07-08 DIAGNOSIS — R2681 Unsteadiness on feet: Secondary | ICD-10-CM | POA: Diagnosis not present

## 2019-07-08 DIAGNOSIS — M25561 Pain in right knee: Secondary | ICD-10-CM | POA: Diagnosis not present

## 2019-07-08 DIAGNOSIS — M6281 Muscle weakness (generalized): Secondary | ICD-10-CM | POA: Diagnosis not present

## 2019-07-10 DIAGNOSIS — R2681 Unsteadiness on feet: Secondary | ICD-10-CM | POA: Diagnosis not present

## 2019-07-10 DIAGNOSIS — M6281 Muscle weakness (generalized): Secondary | ICD-10-CM | POA: Diagnosis not present

## 2019-07-10 DIAGNOSIS — M25561 Pain in right knee: Secondary | ICD-10-CM | POA: Diagnosis not present

## 2019-07-12 DIAGNOSIS — M6281 Muscle weakness (generalized): Secondary | ICD-10-CM | POA: Diagnosis not present

## 2019-07-12 DIAGNOSIS — R2681 Unsteadiness on feet: Secondary | ICD-10-CM | POA: Diagnosis not present

## 2019-07-12 DIAGNOSIS — M25561 Pain in right knee: Secondary | ICD-10-CM | POA: Diagnosis not present

## 2019-07-15 DIAGNOSIS — M25561 Pain in right knee: Secondary | ICD-10-CM | POA: Diagnosis not present

## 2019-07-15 DIAGNOSIS — M6281 Muscle weakness (generalized): Secondary | ICD-10-CM | POA: Diagnosis not present

## 2019-07-15 DIAGNOSIS — R2681 Unsteadiness on feet: Secondary | ICD-10-CM | POA: Diagnosis not present

## 2019-07-16 DIAGNOSIS — M6281 Muscle weakness (generalized): Secondary | ICD-10-CM | POA: Diagnosis not present

## 2019-07-16 DIAGNOSIS — R2681 Unsteadiness on feet: Secondary | ICD-10-CM | POA: Diagnosis not present

## 2019-07-16 DIAGNOSIS — M25561 Pain in right knee: Secondary | ICD-10-CM | POA: Diagnosis not present

## 2019-07-17 DIAGNOSIS — R2681 Unsteadiness on feet: Secondary | ICD-10-CM | POA: Diagnosis not present

## 2019-07-17 DIAGNOSIS — M6281 Muscle weakness (generalized): Secondary | ICD-10-CM | POA: Diagnosis not present

## 2019-07-17 DIAGNOSIS — M25561 Pain in right knee: Secondary | ICD-10-CM | POA: Diagnosis not present

## 2019-07-19 DIAGNOSIS — M6281 Muscle weakness (generalized): Secondary | ICD-10-CM | POA: Diagnosis not present

## 2019-07-19 DIAGNOSIS — M25561 Pain in right knee: Secondary | ICD-10-CM | POA: Diagnosis not present

## 2019-07-19 DIAGNOSIS — R2681 Unsteadiness on feet: Secondary | ICD-10-CM | POA: Diagnosis not present

## 2019-07-23 DIAGNOSIS — R2681 Unsteadiness on feet: Secondary | ICD-10-CM | POA: Diagnosis not present

## 2019-07-23 DIAGNOSIS — M25561 Pain in right knee: Secondary | ICD-10-CM | POA: Diagnosis not present

## 2019-07-23 DIAGNOSIS — M6281 Muscle weakness (generalized): Secondary | ICD-10-CM | POA: Diagnosis not present

## 2019-07-26 DIAGNOSIS — R2681 Unsteadiness on feet: Secondary | ICD-10-CM | POA: Diagnosis not present

## 2019-07-26 DIAGNOSIS — M25561 Pain in right knee: Secondary | ICD-10-CM | POA: Diagnosis not present

## 2019-07-26 DIAGNOSIS — M6281 Muscle weakness (generalized): Secondary | ICD-10-CM | POA: Diagnosis not present

## 2019-07-30 DIAGNOSIS — R2681 Unsteadiness on feet: Secondary | ICD-10-CM | POA: Diagnosis not present

## 2019-07-30 DIAGNOSIS — M6281 Muscle weakness (generalized): Secondary | ICD-10-CM | POA: Diagnosis not present

## 2019-07-30 DIAGNOSIS — M25561 Pain in right knee: Secondary | ICD-10-CM | POA: Diagnosis not present

## 2019-08-01 DIAGNOSIS — M25561 Pain in right knee: Secondary | ICD-10-CM | POA: Diagnosis not present

## 2019-08-01 DIAGNOSIS — M6281 Muscle weakness (generalized): Secondary | ICD-10-CM | POA: Diagnosis not present

## 2019-08-01 DIAGNOSIS — R2681 Unsteadiness on feet: Secondary | ICD-10-CM | POA: Diagnosis not present

## 2019-08-02 DIAGNOSIS — M25561 Pain in right knee: Secondary | ICD-10-CM | POA: Diagnosis not present

## 2019-08-02 DIAGNOSIS — M6281 Muscle weakness (generalized): Secondary | ICD-10-CM | POA: Diagnosis not present

## 2019-08-02 DIAGNOSIS — R2681 Unsteadiness on feet: Secondary | ICD-10-CM | POA: Diagnosis not present

## 2019-08-06 DIAGNOSIS — M25561 Pain in right knee: Secondary | ICD-10-CM | POA: Diagnosis not present

## 2019-08-06 DIAGNOSIS — R2681 Unsteadiness on feet: Secondary | ICD-10-CM | POA: Diagnosis not present

## 2019-08-06 DIAGNOSIS — M6281 Muscle weakness (generalized): Secondary | ICD-10-CM | POA: Diagnosis not present

## 2019-08-13 DIAGNOSIS — R2681 Unsteadiness on feet: Secondary | ICD-10-CM | POA: Diagnosis not present

## 2019-08-13 DIAGNOSIS — M6281 Muscle weakness (generalized): Secondary | ICD-10-CM | POA: Diagnosis not present

## 2019-08-13 DIAGNOSIS — M25561 Pain in right knee: Secondary | ICD-10-CM | POA: Diagnosis not present

## 2019-08-15 DIAGNOSIS — M6281 Muscle weakness (generalized): Secondary | ICD-10-CM | POA: Diagnosis not present

## 2019-08-15 DIAGNOSIS — R2681 Unsteadiness on feet: Secondary | ICD-10-CM | POA: Diagnosis not present

## 2019-08-15 DIAGNOSIS — M25561 Pain in right knee: Secondary | ICD-10-CM | POA: Diagnosis not present

## 2019-08-19 DIAGNOSIS — R2681 Unsteadiness on feet: Secondary | ICD-10-CM | POA: Diagnosis not present

## 2019-08-19 DIAGNOSIS — M6281 Muscle weakness (generalized): Secondary | ICD-10-CM | POA: Diagnosis not present

## 2019-08-19 DIAGNOSIS — M25561 Pain in right knee: Secondary | ICD-10-CM | POA: Diagnosis not present

## 2019-08-20 DIAGNOSIS — M25561 Pain in right knee: Secondary | ICD-10-CM | POA: Diagnosis not present

## 2019-08-20 DIAGNOSIS — M6281 Muscle weakness (generalized): Secondary | ICD-10-CM | POA: Diagnosis not present

## 2019-08-20 DIAGNOSIS — R2681 Unsteadiness on feet: Secondary | ICD-10-CM | POA: Diagnosis not present

## 2019-08-30 ENCOUNTER — Emergency Department (HOSPITAL_COMMUNITY): Payer: Medicare Other

## 2019-08-30 ENCOUNTER — Inpatient Hospital Stay (HOSPITAL_COMMUNITY)
Admission: EM | Admit: 2019-08-30 | Discharge: 2019-09-02 | DRG: 309 | Disposition: A | Discharge status: Home / Self Care | Payer: Medicare Other | Attending: Cardiovascular Disease | Admitting: Cardiovascular Disease

## 2019-08-30 ENCOUNTER — Other Ambulatory Visit: Payer: Self-pay

## 2019-08-30 DIAGNOSIS — Z885 Allergy status to narcotic agent status: Secondary | ICD-10-CM | POA: Diagnosis not present

## 2019-08-30 DIAGNOSIS — R0689 Other abnormalities of breathing: Secondary | ICD-10-CM | POA: Diagnosis not present

## 2019-08-30 DIAGNOSIS — R52 Pain, unspecified: Secondary | ICD-10-CM | POA: Diagnosis not present

## 2019-08-30 DIAGNOSIS — Z96659 Presence of unspecified artificial knee joint: Secondary | ICD-10-CM | POA: Diagnosis present

## 2019-08-30 DIAGNOSIS — I110 Hypertensive heart disease with heart failure: Secondary | ICD-10-CM | POA: Diagnosis present

## 2019-08-30 DIAGNOSIS — I447 Left bundle-branch block, unspecified: Secondary | ICD-10-CM | POA: Diagnosis present

## 2019-08-30 DIAGNOSIS — I340 Nonrheumatic mitral (valve) insufficiency: Secondary | ICD-10-CM | POA: Diagnosis not present

## 2019-08-30 DIAGNOSIS — Z20822 Contact with and (suspected) exposure to covid-19: Secondary | ICD-10-CM | POA: Diagnosis present

## 2019-08-30 DIAGNOSIS — E039 Hypothyroidism, unspecified: Secondary | ICD-10-CM | POA: Diagnosis present

## 2019-08-30 DIAGNOSIS — Z85048 Personal history of other malignant neoplasm of rectum, rectosigmoid junction, and anus: Secondary | ICD-10-CM

## 2019-08-30 DIAGNOSIS — Z96642 Presence of left artificial hip joint: Secondary | ICD-10-CM | POA: Diagnosis present

## 2019-08-30 DIAGNOSIS — R7989 Other specified abnormal findings of blood chemistry: Secondary | ICD-10-CM | POA: Diagnosis not present

## 2019-08-30 DIAGNOSIS — Z853 Personal history of malignant neoplasm of breast: Secondary | ICD-10-CM | POA: Diagnosis not present

## 2019-08-30 DIAGNOSIS — I10 Essential (primary) hypertension: Secondary | ICD-10-CM | POA: Diagnosis not present

## 2019-08-30 DIAGNOSIS — K219 Gastro-esophageal reflux disease without esophagitis: Secondary | ICD-10-CM | POA: Diagnosis present

## 2019-08-30 DIAGNOSIS — I361 Nonrheumatic tricuspid (valve) insufficiency: Secondary | ICD-10-CM | POA: Diagnosis not present

## 2019-08-30 DIAGNOSIS — R748 Abnormal levels of other serum enzymes: Secondary | ICD-10-CM | POA: Diagnosis not present

## 2019-08-30 DIAGNOSIS — Z90710 Acquired absence of both cervix and uterus: Secondary | ICD-10-CM | POA: Diagnosis not present

## 2019-08-30 DIAGNOSIS — Z79899 Other long term (current) drug therapy: Secondary | ICD-10-CM

## 2019-08-30 DIAGNOSIS — Z888 Allergy status to other drugs, medicaments and biological substances status: Secondary | ICD-10-CM

## 2019-08-30 DIAGNOSIS — I5032 Chronic diastolic (congestive) heart failure: Secondary | ICD-10-CM | POA: Diagnosis present

## 2019-08-30 DIAGNOSIS — I249 Acute ischemic heart disease, unspecified: Secondary | ICD-10-CM | POA: Diagnosis present

## 2019-08-30 DIAGNOSIS — R000 Tachycardia, unspecified: Secondary | ICD-10-CM | POA: Diagnosis not present

## 2019-08-30 DIAGNOSIS — Z9011 Acquired absence of right breast and nipple: Secondary | ICD-10-CM

## 2019-08-30 DIAGNOSIS — I248 Other forms of acute ischemic heart disease: Secondary | ICD-10-CM | POA: Diagnosis present

## 2019-08-30 DIAGNOSIS — Z66 Do not resuscitate: Secondary | ICD-10-CM | POA: Diagnosis present

## 2019-08-30 DIAGNOSIS — R002 Palpitations: Secondary | ICD-10-CM | POA: Diagnosis not present

## 2019-08-30 DIAGNOSIS — I480 Paroxysmal atrial fibrillation: Secondary | ICD-10-CM | POA: Diagnosis not present

## 2019-08-30 DIAGNOSIS — M4802 Spinal stenosis, cervical region: Secondary | ICD-10-CM | POA: Diagnosis present

## 2019-08-30 DIAGNOSIS — I440 Atrioventricular block, first degree: Secondary | ICD-10-CM | POA: Diagnosis present

## 2019-08-30 DIAGNOSIS — Z8261 Family history of arthritis: Secondary | ICD-10-CM

## 2019-08-30 DIAGNOSIS — Z8673 Personal history of transient ischemic attack (TIA), and cerebral infarction without residual deficits: Secondary | ICD-10-CM

## 2019-08-30 DIAGNOSIS — Z8249 Family history of ischemic heart disease and other diseases of the circulatory system: Secondary | ICD-10-CM

## 2019-08-30 DIAGNOSIS — R778 Other specified abnormalities of plasma proteins: Secondary | ICD-10-CM

## 2019-08-30 DIAGNOSIS — I479 Paroxysmal tachycardia, unspecified: Secondary | ICD-10-CM | POA: Diagnosis not present

## 2019-08-30 DIAGNOSIS — E7849 Other hyperlipidemia: Secondary | ICD-10-CM | POA: Diagnosis not present

## 2019-08-30 DIAGNOSIS — I4891 Unspecified atrial fibrillation: Secondary | ICD-10-CM | POA: Diagnosis not present

## 2019-08-30 DIAGNOSIS — E785 Hyperlipidemia, unspecified: Secondary | ICD-10-CM | POA: Diagnosis present

## 2019-08-30 LAB — COMPREHENSIVE METABOLIC PANEL
ALT: 14 U/L (ref 0–44)
AST: 18 U/L (ref 15–41)
Albumin: 3.4 g/dL — ABNORMAL LOW (ref 3.5–5.0)
Alkaline Phosphatase: 51 U/L (ref 38–126)
Anion gap: 9 (ref 5–15)
BUN: 17 mg/dL (ref 8–23)
CO2: 23 mmol/L (ref 22–32)
Calcium: 9.8 mg/dL (ref 8.9–10.3)
Chloride: 105 mmol/L (ref 98–111)
Creatinine, Ser: 1.05 mg/dL — ABNORMAL HIGH (ref 0.44–1.00)
GFR calc Af Amer: 53 mL/min — ABNORMAL LOW (ref >60)
GFR calc non Af Amer: 46 mL/min — ABNORMAL LOW (ref >60)
Glucose, Bld: 112 mg/dL — ABNORMAL HIGH (ref 70–99)
Potassium: 4.4 mmol/L (ref 3.5–5.1)
Sodium: 137 mmol/L (ref 135–145)
Total Bilirubin: 0.4 mg/dL (ref 0.3–1.2)
Total Protein: 6.6 g/dL (ref 6.5–8.1)

## 2019-08-30 LAB — CBC WITH DIFFERENTIAL/PLATELET
Abs Immature Granulocytes: 0.06 10*3/uL (ref 0.00–0.07)
Basophils Absolute: 0 10*3/uL (ref 0.0–0.1)
Basophils Relative: 0 %
Eosinophils Absolute: 0.2 10*3/uL (ref 0.0–0.5)
Eosinophils Relative: 2 %
HCT: 41.2 % (ref 36.0–46.0)
Hemoglobin: 12.1 g/dL (ref 12.0–15.0)
Immature Granulocytes: 1 %
Lymphocytes Relative: 27 %
Lymphs Abs: 1.9 10*3/uL (ref 0.7–4.0)
MCH: 24.2 pg — ABNORMAL LOW (ref 26.0–34.0)
MCHC: 29.4 g/dL — ABNORMAL LOW (ref 30.0–36.0)
MCV: 82.2 fL (ref 80.0–100.0)
Monocytes Absolute: 1 10*3/uL (ref 0.1–1.0)
Monocytes Relative: 14 %
Neutro Abs: 4.1 10*3/uL (ref 1.7–7.7)
Neutrophils Relative %: 56 %
Platelets: 234 10*3/uL (ref 150–400)
RBC: 5.01 MIL/uL (ref 3.87–5.11)
RDW: 18.8 % — ABNORMAL HIGH (ref 11.5–15.5)
WBC: 7.3 10*3/uL (ref 4.0–10.5)
nRBC: 0 % (ref 0.0–0.2)

## 2019-08-30 LAB — TROPONIN I (HIGH SENSITIVITY)
Troponin I (High Sensitivity): 19 ng/L — ABNORMAL HIGH (ref <18)
Troponin I (High Sensitivity): 84 ng/L — ABNORMAL HIGH (ref <18)

## 2019-08-30 LAB — PROTIME-INR
INR: 1 (ref 0.8–1.2)
Prothrombin Time: 13.4 seconds (ref 11.4–15.2)

## 2019-08-30 LAB — APTT: aPTT: 36 seconds (ref 24–36)

## 2019-08-30 LAB — SARS CORONAVIRUS 2 (TAT 6-24 HRS): SARS Coronavirus 2: NEGATIVE

## 2019-08-30 LAB — HEPARIN LEVEL (UNFRACTIONATED): Heparin Unfractionated: 0.46 IU/mL (ref 0.30–0.70)

## 2019-08-30 LAB — POC SARS CORONAVIRUS 2 AG -  ED: SARS Coronavirus 2 Ag: NEGATIVE

## 2019-08-30 MED ORDER — ASPIRIN 300 MG RE SUPP: 300.0000 mg | RECTAL | Status: AC

## 2019-08-30 MED ORDER — METOPROLOL TARTRATE 5 MG/5ML IV SOLN
5.0000 mg | Freq: Once | INTRAVENOUS | Status: AC
  Administered 2019-08-30: 10:00:00 5 mg via INTRAVENOUS
  Filled 2019-08-30: qty 5

## 2019-08-30 MED ORDER — ACETAMINOPHEN 325 MG PO TABS
650.0000 mg | ORAL_TABLET | Freq: Once | ORAL | Status: AC
  Administered 2019-08-30: 650 mg via ORAL
  Filled 2019-08-30: qty 2

## 2019-08-30 MED ORDER — AMLODIPINE BESYLATE 5 MG PO TABS
5.0000 mg | ORAL_TABLET | Freq: Once | ORAL | Status: AC
  Administered 2019-08-30: 13:00:00 5 mg via ORAL
  Filled 2019-08-30: qty 1

## 2019-08-30 MED ORDER — SODIUM CHLORIDE 0.9% FLUSH
3.0000 mL | Freq: Once | INTRAVENOUS | Status: AC
  Administered 2019-08-30: 3 mL via INTRAVENOUS

## 2019-08-30 MED ORDER — SODIUM CHLORIDE 0.9 % IV SOLN: 250.0000 mL | INTRAVENOUS | Status: DC | PRN

## 2019-08-30 MED ORDER — ASPIRIN EC 81 MG PO TBEC
81.0000 mg | DELAYED_RELEASE_TABLET | Freq: Every day | ORAL | Status: DC
  Administered 2019-08-31 – 2019-09-02 (×3): 81 mg via ORAL
  Filled 2019-08-30 (×3): qty 1

## 2019-08-30 MED ORDER — ACETAMINOPHEN 325 MG PO TABS
650.0000 mg | ORAL_TABLET | ORAL | Status: DC | PRN
  Administered 2019-08-30 – 2019-09-01 (×4): 650 mg via ORAL
  Filled 2019-08-30 (×5): qty 2

## 2019-08-30 MED ORDER — ONDANSETRON HCL 4 MG/2ML IJ SOLN: 4.0000 mg | Freq: Four times a day (QID) | INTRAMUSCULAR | Status: DC | PRN

## 2019-08-30 MED ORDER — HEPARIN BOLUS VIA INFUSION
4000.0000 [IU] | Freq: Once | INTRAVENOUS | Status: AC
  Administered 2019-08-30: 4000 [IU] via INTRAVENOUS
  Filled 2019-08-30: qty 4000

## 2019-08-30 MED ORDER — ASPIRIN 81 MG PO CHEW
324.0000 mg | CHEWABLE_TABLET | ORAL | Status: AC
  Administered 2019-08-30: 16:00:00 324 mg via ORAL
  Filled 2019-08-30: qty 4

## 2019-08-30 MED ORDER — SODIUM CHLORIDE 0.9% FLUSH: 3.0000 mL | INTRAVENOUS | Status: DC | PRN

## 2019-08-30 MED ORDER — SODIUM CHLORIDE 0.9% FLUSH
3.0000 mL | Freq: Two times a day (BID) | INTRAVENOUS | Status: DC
  Administered 2019-08-31 – 2019-09-01 (×2): 3 mL via INTRAVENOUS

## 2019-08-30 MED ORDER — HEPARIN (PORCINE) 25000 UT/250ML-% IV SOLN
1000.0000 [IU]/h | INTRAVENOUS | Status: DC
  Administered 2019-08-30: 18:00:00 900 [IU]/h via INTRAVENOUS
  Filled 2019-08-30 (×3): qty 250

## 2019-08-30 MED ORDER — MELATONIN 3 MG PO TABS
3.0000 mg | ORAL_TABLET | Freq: Once | ORAL | Status: AC
  Administered 2019-08-30: 3 mg via ORAL
  Filled 2019-08-30: qty 1

## 2019-08-30 MED ORDER — CLONIDINE HCL 0.2 MG PO TABS
0.2000 mg | ORAL_TABLET | Freq: Once | ORAL | Status: AC
  Administered 2019-08-30: 0.2 mg via ORAL
  Filled 2019-08-30: qty 1

## 2019-08-30 MED ORDER — NITROGLYCERIN 0.4 MG SL SUBL: 0.4000 mg | SUBLINGUAL_TABLET | SUBLINGUAL | Status: DC | PRN

## 2019-08-30 NOTE — ED Notes (Signed)
Claiborne Billings PA made aware of critical Troponin and BP not improved with medication
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
## 2019-08-30 NOTE — ED Triage Notes (Signed)
Came in via EMS from the independent side of [place]. C/O palpitations upon waking this morning. EMS endorsed low BP on scene and NS 500 bolus given. Initial EKG lead Afib; denies pain when asked.
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
## 2019-08-30 NOTE — ED Notes (Signed)
Noted patient's BP is elevated; repositioned cuff and patient; rhythm changed noted as well; EKG repeated. Will notify ER provider.

## 2019-08-30 NOTE — ED Notes (Signed)
Patient undress fully gown on ,on monitor .ekg done shown to dr.ray

## 2019-08-30 NOTE — ED Notes (Signed)
Patient has purwick on with chux under her.

## 2019-08-31 ENCOUNTER — Inpatient Hospital Stay (HOSPITAL_COMMUNITY): Payer: Medicare Other

## 2019-08-31 ENCOUNTER — Other Ambulatory Visit: Payer: Self-pay

## 2019-08-31 LAB — BASIC METABOLIC PANEL
Anion gap: 9 (ref 5–15)
BUN: 15 mg/dL (ref 8–23)
CO2: 24 mmol/L (ref 22–32)
Calcium: 9.9 mg/dL (ref 8.9–10.3)
Chloride: 105 mmol/L (ref 98–111)
Creatinine, Ser: 0.9 mg/dL (ref 0.44–1.00)
GFR calc Af Amer: >60 mL/min (ref >60)
GFR calc non Af Amer: 56 mL/min — ABNORMAL LOW (ref >60)
Glucose, Bld: 102 mg/dL — ABNORMAL HIGH (ref 70–99)
Potassium: 4.1 mmol/L (ref 3.5–5.1)
Sodium: 138 mmol/L (ref 135–145)

## 2019-08-31 LAB — PROTIME-INR
INR: 1.1 (ref 0.8–1.2)
Prothrombin Time: 13.6 seconds (ref 11.4–15.2)

## 2019-08-31 LAB — CBC
HCT: 37.3 % (ref 36.0–46.0)
Hemoglobin: 11.2 g/dL — ABNORMAL LOW (ref 12.0–15.0)
MCH: 24.5 pg — ABNORMAL LOW (ref 26.0–34.0)
MCHC: 30 g/dL (ref 30.0–36.0)
MCV: 81.4 fL (ref 80.0–100.0)
Platelets: 212 10*3/uL (ref 150–400)
RBC: 4.58 MIL/uL (ref 3.87–5.11)
RDW: 18.6 % — ABNORMAL HIGH (ref 11.5–15.5)
WBC: 6 10*3/uL (ref 4.0–10.5)
nRBC: 0 % (ref 0.0–0.2)

## 2019-08-31 LAB — LIPID PANEL
Cholesterol: 161 mg/dL (ref 0–200)
HDL: 58 mg/dL (ref >40)
LDL Cholesterol: 87 mg/dL (ref 0–99)
Total CHOL/HDL Ratio: 2.8 RATIO
Triglycerides: 82 mg/dL (ref <150)
VLDL: 16 mg/dL (ref 0–40)

## 2019-08-31 LAB — HEPARIN LEVEL (UNFRACTIONATED): Heparin Unfractionated: 0.56 IU/mL (ref 0.30–0.70)

## 2019-08-31 LAB — MRSA PCR SCREENING: MRSA by PCR: POSITIVE — AB

## 2019-08-31 LAB — ECHOCARDIOGRAM COMPLETE
Height: 66 in
Weight: 2667.2 oz

## 2019-08-31 MED ORDER — AMIODARONE HCL 100 MG PO TABS
100.0000 mg | ORAL_TABLET | Freq: Every day | ORAL | Status: DC
  Administered 2019-08-31 – 2019-09-02 (×3): 100 mg via ORAL
  Filled 2019-08-31 (×3): qty 1

## 2019-08-31 MED ORDER — AMLODIPINE BESYLATE 5 MG PO TABS
5.0000 mg | ORAL_TABLET | Freq: Every day | ORAL | Status: DC
  Administered 2019-08-31 – 2019-09-02 (×3): 5 mg via ORAL
  Filled 2019-08-31 (×3): qty 1

## 2019-08-31 MED ORDER — CHLORHEXIDINE GLUCONATE CLOTH 2 % EX PADS
6.0000 | MEDICATED_PAD | Freq: Every day | CUTANEOUS | Status: DC
  Administered 2019-08-31 – 2019-09-01 (×2): 6 via TOPICAL

## 2019-08-31 MED ORDER — MUPIROCIN 2 % EX OINT
1.0000 "application " | TOPICAL_OINTMENT | Freq: Two times a day (BID) | CUTANEOUS | Status: DC
  Administered 2019-08-31 – 2019-09-01 (×4): 1 via NASAL
  Filled 2019-08-31: qty 22

## 2019-08-31 MED ORDER — MELATONIN 3 MG PO TABS
3.0000 mg | ORAL_TABLET | Freq: Every evening | ORAL | Status: DC | PRN
  Administered 2019-08-31: 3 mg via ORAL
  Filled 2019-08-31 (×2): qty 1

## 2019-08-31 MED ORDER — PRAVASTATIN SODIUM 10 MG PO TABS
20.0000 mg | ORAL_TABLET | Freq: Every day | ORAL | Status: DC
  Administered 2019-08-31 – 2019-09-01 (×2): 20 mg via ORAL
  Filled 2019-08-31 (×2): qty 2

## 2019-08-31 NOTE — Progress Notes (Signed)
ANTICOAGULATION CONSULT NOTE   Pharmacy Consult for Heparin  Indication: chest pain/ACS and Afib   Patient Measurements: Height: 5\' 6"  (167.6 cm) Weight: 166 lb 11.2 oz (75.6 kg) IBW/kg (Calculated) : 59.3 Heparin Dosing Weight: 75.7 kg  Vital Signs: Temp: 98 F (36.7 C) (02/27 0549) Temp Source: Oral (02/27 0549) BP: 179/65 (02/27 0549) Pulse Rate: 67 (02/27 0549)  Labs: Recent Labs    08/30/19 0955 08/30/19 1202 08/30/19 1615 08/30/19 2234 08/31/19 0435  HGB 12.1  --   --   --  11.2*  HCT 41.2  --   --   --  37.3  PLT 234  --   --   --  212  APTT  --   --  36  --   --   LABPROT  --   --  13.4  --  13.6  INR  --   --  1.0  --  1.1  HEPARINUNFRC  --   --   --  0.46 0.56  CREATININE 1.05*  --   --   --  0.90  TROPONINIHS 19* 84*  --   --   --     Estimated Creatinine Clearance: 41.4 mL/min (by C-G formula based on SCr of 0.9 mg/dL).   Assessment: 86 y/o female with ACS and afib RVR (CHADS2VASc = 7). No anticoagulation PTA.    HL at goal. H/H, plt stable.   Goal of Therapy:  Heparin level 0.3-0.7 units/ml Monitor platelets by anticoagulation protocol: Yes   Plan:  Cont heparin at 900 units/hr Monitor daily HL, CBC, plt Monitor for signs/symptoms of bleeding   Benetta Spar, PharmD, BCPS, BCCP Clinical Pharmacist  Please check AMION for all [place] phone numbers After 10:00 PM, call [place] [phone]
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
## 2019-08-31 NOTE — Progress Notes (Signed)
Subjective:  Patient denies any chest pain no further palpitations.  Remains in sinus rhythm.  No further episodes of A. fib with RVR.  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  Physical Exam: Neck: no adenopathy, no carotid bruit, no JVD and supple, symmetrical, trachea midline Lungs: clear to auscultation bilaterally Heart: regular rate and rhythm and S1-S2 soft paradoxical splitting of second heart sound noted 2/6 systolic murmur noted Abdomen: soft, non-tender; bowel sounds normal; no masses,  no organomegaly Extremities: extremities normal, atraumatic, no cyanosis or edema  Lab Results: Recent Labs    08/30/19 0955 08/31/19 0435  WBC 7.3 6.0  HGB 12.1 11.2*  PLT 234 212   Recent Labs    08/30/19 0955 08/31/19 0435  NA 137 138  K 4.4 4.1  CL 105 105  CO2 23 24  GLUCOSE 112* 102*  BUN 17 15  CREATININE 1.05* 0.90   No results for input(s): TROPONINI in the last 72 hours.  Invalid input(s): CK, MB Hepatic Function Panel Recent Labs    08/30/19 0955  PROT 6.6  ALBUMIN 3.4*  AST 18  ALT 14  ALKPHOS 51  BILITOT 0.4   Recent Labs    08/31/19 0435  CHOL 161   No results for input(s): PROTIME in the last 72 hours.  Imaging: Imaging results have been reviewed and DG Chest Portable 1 View  Result Date: 08/30/2019 CLINICAL DATA:  Heart palpitations. EXAM: PORTABLE CHEST 1 VIEW  COMPARISON:  02/22/2017 FINDINGS: 0957 hours. The cardio pericardial silhouette is enlarged. Interstitial markings are diffusely coarsened with chronic features. There is subtle bibasilar atelectasis or infiltrate. No substantial pleural effusion. Bones are diffusely demineralized. Telemetry leads overlie the chest. IMPRESSION: Cardiomegaly with chronic interstitial changes and patchy atelectasis or infiltrate at the bases. Electronically Signed   By: Misty Yum M.D.   On: 08/30/2019 10:10    Cardiac Studies:  Assessment/Plan:  Status post atrial fibrillation with RVR, Paroxysmal, CHA2DS2VASc score of 8 Abnormal troponin I, possible demand ischemia HTN Hypothyroidism S/P stroke Plan Continue present management PT consult Add low-dose amiodarone as per orders  LOS: 1 day    [name] 08/31/2019, 10:24 AM

## 2019-08-31 NOTE — Progress Notes (Signed)
  Echocardiogram 2D Echocardiogram has been performed.  [name] 08/31/2019, 11:09 AM

## 2019-09-01 LAB — CBC
HCT: 38.5 % (ref 36.0–46.0)
Hemoglobin: 11.6 g/dL — ABNORMAL LOW (ref 12.0–15.0)
MCH: 24.4 pg — ABNORMAL LOW (ref 26.0–34.0)
MCHC: 30.1 g/dL (ref 30.0–36.0)
MCV: 81.1 fL (ref 80.0–100.0)
Platelets: 214 10*3/uL (ref 150–400)
RBC: 4.75 MIL/uL (ref 3.87–5.11)
RDW: 18.4 % — ABNORMAL HIGH (ref 11.5–15.5)
WBC: 7.1 10*3/uL (ref 4.0–10.5)
nRBC: 0 % (ref 0.0–0.2)

## 2019-09-01 LAB — HEPARIN LEVEL (UNFRACTIONATED): Heparin Unfractionated: 0.27 IU/mL — ABNORMAL LOW (ref 0.30–0.70)

## 2019-09-01 MED ORDER — LOSARTAN POTASSIUM 25 MG PO TABS
25.0000 mg | ORAL_TABLET | Freq: Every day | ORAL | Status: DC
  Administered 2019-09-01 – 2019-09-02 (×2): 25 mg via ORAL
  Filled 2019-09-01 (×2): qty 1

## 2019-09-01 NOTE — Evaluation (Signed)
Physical Therapy Evaluation Patient Details Name: [name] MRN: [id number] DOB: 15-Jun-1925 Today's Date: 09/01/2019   History of Present Illness  86 years old white female presented with palpitations and was found to have atrial fibrillation with RVR. She responded IV metoprolol. She has PMH of breast cancer with surgery, CHF, HTN, Hypothyroidism, Rectal cancer, stroke, IBS and low back pain.  Clinical Impression  Pt presents to PT with no significant deficits, ambulating at a modI level with use of RW (no Rollator available on floor). Pt reports feeling at her baseline and is excited to return back to ALF. Pt is encouraged to ambulate multiple times per day with assistance from staff due to IV lines. Pt requires no further acute PT services at this time, acute PT signing off 09/01/2019.    Follow Up Recommendations No PT follow up    Equipment Recommendations  None recommended by PT    Recommendations for Other Services       Precautions / Restrictions Precautions Precautions: None Restrictions Weight Bearing Restrictions: No      Mobility  Bed Mobility Overal bed mobility: (pt received and left in recliner)                Transfers Overall transfer level: Modified independent Equipment used: Rolling walker (2 wheeled)                Ambulation/Gait Ambulation/Gait assistance: Modified independent (Device/Increase time) Gait Distance (Feet): 300 Feet Assistive device: Rolling walker (2 wheeled) Gait Pattern/deviations: Step-through pattern Gait velocity: functional Gait velocity interpretation: >2.62 ft/sec, indicative of community ambulatory General Gait Details: pt with steady step through gait, no noted balance deviations  Stairs            Wheelchair Mobility    Modified Rankin (Stroke Patients Only)       Balance Overall balance assessment: Modified Independent                                            Pertinent Vitals/Pain Pain Assessment: 0-10 Pain Score: 4  Pain Location: R knee Pain Descriptors / Indicators: Aching Pain Intervention(s): Limited activity within patient's tolerance    Home Living Family/patient expects to be discharged to:: Assisted living               Home Equipment: Environmental consultant - 4 wheels Additional Comments: From [facility] on 3rd floor w/elevator    Prior Function Level of Independence: Independent with assistive device(s)         Comments: Rollator for community distances, no AD in apartment, 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     Hand Dominance   Dominant Hand: Right    Extremity/Trunk Assessment   Upper Extremity Assessment Upper Extremity Assessment: Overall WFL for tasks assessed    Lower Extremity Assessment Lower Extremity Assessment: Overall WFL for tasks assessed    Cervical / Trunk Assessment Cervical / Trunk Assessment: Normal  Communication   Communication: HOH  Cognition Arousal/Alertness: Awake/alert Behavior During Therapy: WFL for tasks assessed/performed Overall Cognitive Status: Within Functional Limits for tasks assessed                                        General Comments General comments (skin integrity, edema, etc.): VSS on  RA    Exercises     Assessment/Plan    PT Assessment Patent does not need any further PT services  PT Problem List         PT Treatment Interventions      PT Goals (Current goals can be found in the Care Plan section)       Frequency     Barriers to discharge        Co-evaluation               AM-PAC PT "6 Clicks" Mobility  Outcome Measure Help needed turning from your back to your side while in a flat bed without using bedrails?: None Help needed moving from lying on your back to sitting on the side of a flat bed without using bedrails?: None Help needed moving to and from a bed to a chair (including a wheelchair)?:  None Help needed standing up from a chair using your arms (e.g., wheelchair or bedside chair)?: None Help needed to walk in hospital room?: None Help needed climbing 3-5 steps with a railing? : None 6 Click Score: 24    End of Session   Activity Tolerance: Patient tolerated treatment well Patient left: in chair;with call bell/phone within reach Nurse Communication: Mobility status      Time: 1341-1405 PT Time Calculation (min) (ACUTE ONLY): 24 min   Charges:   PT Evaluation $PT Eval Low Complexity: [name], PT, DPT Acute Rehabilitation Pager: [phone]   Zenaida Niece 09/01/2019, 2:26 PM

## 2019-09-01 NOTE — Progress Notes (Signed)
Subjective:  Patient denies any chest pain or shortness of breath.  Denies any palpitations.  Objective:  Vital Signs in the last 24 hours: Temp:  [97.6 F (36.4 C)-98.3 F (36.8 C)] 97.6 F (36.4 C) (02/28 0939) Pulse Rate:  [65-66] 66 (02/28 0939) Resp:  [15-17] 17 (02/28 0939) BP: (152-179)/(61-104) 163/77 (02/28 0939) SpO2:  [95 %-97 %] 97 % (02/28 0939) Weight:  [75.7 kg] 75.7 kg (02/28 0614)  Intake/Output from previous day: 02/27 0701 - 02/28 0700 In: 340 [P.O.:240; I.V.:100] Out: 900 [Urine:900] Intake/Output from this shift: Total I/O In: 240 [P.O.:240] Out: 200 [Urine:200]  Physical Exam: Neck: no adenopathy, no carotid bruit, no JVD and supple, symmetrical, trachea midline Lungs: clear to auscultation bilaterally Heart: regular rate and rhythm and Paradoxical splitting of second heart sound 2/6 systolic murmur noted Abdomen: soft, non-tender; bowel sounds normal; no masses,  no organomegaly Extremities: extremities normal, atraumatic, no cyanosis or edema  Lab Results: Recent Labs    08/31/19 0435 09/01/19 0242  WBC 6.0 7.1  HGB 11.2* 11.6*  PLT 212 214   Recent Labs    08/30/19 0955 08/31/19 0435  NA 137 138  K 4.4 4.1  CL 105 105  CO2 23 24  GLUCOSE 112* 102*  BUN 17 15  CREATININE 1.05* 0.90   No results for input(s): TROPONINI in the last 72 hours.  Invalid input(s): CK, MB Hepatic Function Panel Recent Labs    08/30/19 0955  PROT 6.6  ALBUMIN 3.4*  AST 18  ALT 14  ALKPHOS 51  BILITOT 0.4   Recent Labs    08/31/19 0435  CHOL 161   No results for input(s): PROTIME in the last 72 hours.  Imaging: Imaging results have been reviewed and ECHOCARDIOGRAM COMPLETE  Result Date: 08/31/2019    ECHOCARDIOGRAM REPORT   Patient Name:   [name] Date of Exam: 08/31/2019 Medical Rec #:  [id number]       Height:       66.0 in Accession #:    [id number]      Weight:       166.7 lb Date of Birth:  [birth year]       BSA:          1.851 m  Patient Age:    86 years        BP:           179/65 mmHg Patient Gender: F               HR:           69 bpm. Exam Location:  Inpatient Procedure: 2D Echo Indications:    acute coronary syndrome I24.9  History:        Patient has prior history of Echocardiogram examinations, most                 recent 05/19/2019. CHF, TIA, Signs/Symptoms:Murmur; Risk                 Factors:Hypertension.  Sonographer:    Jannett Celestine RDCS (AE) Referring Phys: [place]  1. Left ventricular ejection fraction, by estimation, is 60 to 65%. The left ventricle has normal function. The left ventricle demonstrates regional wall motion abnormalities (see scoring diagram/findings for description). There is moderate left ventricular hypertrophy. Left ventricular diastolic parameters are consistent with Grade I diastolic dysfunction (impaired relaxation). There is mild hypokinesis of the left ventricular, basal-mid inferoseptal wall and inferior wall.  2. Right ventricular systolic  function is normal. The right ventricular size is normal. mildly increased right ventricular wall thickness. There is mildly elevated pulmonary artery systolic pressure.  3. Left atrial size was moderately dilated.  4. The mitral valve is degenerative. Moderate mitral valve regurgitation. Mild mitral stenosis.  5. The aortic valve is tricuspid. Aortic valve regurgitation is trivial. Mild to moderate aortic valve sclerosis/calcification is present, without any evidence of aortic stenosis.  6. There is Moderate (Grade III) atheroma plaque involving the aortic root and ascending aorta.  7. The inferior vena cava is dilated in size with >50% respiratory variability, suggesting right atrial pressure of 8 mmHg. FINDINGS  Left Ventricle: Left ventricular ejection fraction, by estimation, is 60 to 65%. The left ventricle has normal function. The left ventricle demonstrates regional wall motion abnormalities. Mild hypokinesis of the left ventricular,  basal-mid inferoseptal  wall and inferior wall. The left ventricular internal cavity size was small. There is moderate left ventricular hypertrophy. Left ventricular diastolic parameters are consistent with Grade I diastolic dysfunction (impaired relaxation).  LV Wall Scoring: The mid inferolateral segment, mid inferoseptal segment, and mid inferior segment are hypokinetic. The entire anterior wall, antero-lateral wall, entire anterior septum, entire apex, basal inferolateral segment, basal inferior segment, and basal inferoseptal segment are normal. Right Ventricle: The right ventricular size is normal. Mildly increased right ventricular wall thickness. Right ventricular systolic function is normal. There is mildly elevated pulmonary artery systolic pressure. Left Atrium: Left atrial size was moderately dilated. Right Atrium: Right atrial size was normal in size. Pericardium: There is no evidence of pericardial effusion. Presence of pericardial fat pad. Mitral Valve: The mitral valve is degenerative in appearance. There is mild thickening of the mitral valve leaflet(s). There is mild calcification of the mitral valve leaflet(s). Moderate mitral annular calcification. Moderate mitral valve regurgitation.  Mild mitral valve stenosis. Tricuspid Valve: The tricuspid valve is normal in structure. Tricuspid valve regurgitation is mild. Aortic Valve: The aortic valve is tricuspid. Aortic valve regurgitation is trivial. Mild to moderate aortic valve sclerosis/calcification is present, without any evidence of aortic stenosis. There is moderate calcification of the aortic valve. Pulmonic Valve: The pulmonic valve was normal in structure. Pulmonic valve regurgitation is not visualized. Aorta: The aortic root is normal in size and structure. There is moderate (Grade III) atheroma plaque involving the aortic root and ascending aorta. Venous: The inferior vena cava is dilated in size with greater than 50% respiratory  variability, suggesting right atrial pressure of 8 mmHg. IAS/Shunts: No atrial level shunt detected by color flow Doppler.  LEFT VENTRICLE PLAX 2D LVIDd:         2.90 cm LVIDs:         2.00 cm LV PW:         1.50 cm LV IVS:        1.90 cm LVOT diam:     1.90 cm LVOT Area:     2.84 cm  LEFT ATRIUM             Index LA diam:        4.10 cm 2.22 cm/m LA Vol (A2C):   52.0 ml 28.10 ml/m LA Vol (A4C):   55.8 ml 30.15 ml/m LA Biplane Vol: 53.6 ml 28.96 ml/m   AORTA Ao Root diam: 3.10 cm MITRAL VALVE                TRICUSPID VALVE MV Area (PHT): 2.96 cm     TR Peak grad:   56.9 mmHg MV Decel  Time: 256 msec     TR Vmax:        377.00 cm/s MV E velocity: 126.00 cm/s                             SHUNTS                             Systemic Diam: 1.90 cm Dixie Dials MD Electronically signed by Dixie Dials MD Signature Date/Time: 08/31/2019/11:36:56 AM    Final     Cardiac Studies:  Assessment/Plan:  Status post atrial fibrillation with RVR, Paroxysmal, CHA2DS2VASc score of 8 Abnormal troponin I, possible demand ischemia HTN Hypothyroidism S/P stroke Plan Add low-dose losartan as per orders Increase ambulation as tolerated with assistance   LOS: 2 days    Charolette Forward 09/01/2019, 11:22 AM

## 2019-09-01 NOTE — Progress Notes (Signed)
ANTICOAGULATION CONSULT NOTE   Pharmacy Consult for Heparin  Indication: chest pain/ACS and Afib   Patient Measurements: Height: 5\' 6"  (167.6 cm) Weight: 166 lb 14.4 oz (75.7 kg) IBW/kg (Calculated) : 59.3 Heparin Dosing Weight: 75.7 kg  Vital Signs: Temp: 97.6 F (36.4 C) (02/28 0939) Temp Source: Oral (02/28 0939) BP: 163/77 (02/28 0939) Pulse Rate: 66 (02/28 0939)  Labs: Recent Labs    08/30/19 0955 08/30/19 0955 08/30/19 1202 08/30/19 1615 08/30/19 2234 08/31/19 0435 09/01/19 0242  HGB 12.1   < >  --   --   --  11.2* 11.6*  HCT 41.2  --   --   --   --  37.3 38.5  PLT 234  --   --   --   --  212 214  APTT  --   --   --  36  --   --   --   LABPROT  --   --   --  13.4  --  13.6  --   INR  --   --   --  1.0  --  1.1  --   HEPARINUNFRC  --   --   --   --  0.46 0.56 0.27*  CREATININE 1.05*  --   --   --   --  0.90  --   TROPONINIHS 19*  --  84*  --   --   --   --    < > = values in this interval not displayed.    Estimated Creatinine Clearance: 41.5 mL/min (by C-G formula based on SCr of 0.9 mg/dL).   Assessment: 86 y/o female with ACS and afib RVR (CHADS2VASc = 7). No anticoagulation PTA.    HL slightly subtherapeutic. H/H, plt stable. No issues with infusion and no bleeding per RN. May have dropped due to bolus effect wearing off although timing doesn't quite match. 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 Continue heparin for now with decreased goal. Will increase rate slightly.  Goal of Therapy:  Heparin level 0.3-0.5 units/ml Monitor platelets by anticoagulation protocol: Yes   Plan:  Incr heparin to 950 units/hr  Monitor daily HL, CBC, plt Monitor for signs/symptoms of bleeding   Benetta Spar, PharmD, BCPS, BCCP Clinical Pharmacist  Please check AMION for all [place] phone numbers After 10:00 PM, call [place] [phone]

## 2019-09-02 LAB — CBC
HCT: 41 % (ref 36.0–46.0)
Hemoglobin: 12.2 g/dL (ref 12.0–15.0)
MCH: 24.4 pg — ABNORMAL LOW (ref 26.0–34.0)
MCHC: 29.8 g/dL — ABNORMAL LOW (ref 30.0–36.0)
MCV: 82 fL (ref 80.0–100.0)
Platelets: 202 10*3/uL (ref 150–400)
RBC: 5 MIL/uL (ref 3.87–5.11)
RDW: 18.5 % — ABNORMAL HIGH (ref 11.5–15.5)
WBC: 7 10*3/uL (ref 4.0–10.5)
nRBC: 0 % (ref 0.0–0.2)

## 2019-09-02 LAB — HEPARIN LEVEL (UNFRACTIONATED): Heparin Unfractionated: 0.3 IU/mL (ref 0.30–0.70)

## 2019-09-02 MED ORDER — AMIODARONE HCL 100 MG PO TABS: 100.0000 mg | ORAL_TABLET | Freq: Every day | ORAL | 3 refills | Status: DC

## 2019-09-02 MED ORDER — LOSARTAN POTASSIUM 25 MG PO TABS: 25.0000 mg | ORAL_TABLET | Freq: Every day | ORAL | 3 refills | Status: DC

## 2019-09-02 MED ORDER — CLOPIDOGREL BISULFATE 75 MG PO TABS: 75.0000 mg | ORAL_TABLET | Freq: Every day | ORAL | 3 refills | Status: AC

## 2019-09-02 MED ORDER — CLOPIDOGREL BISULFATE 75 MG PO TABS: 75.0000 mg | ORAL_TABLET | Freq: Every day | ORAL | Status: DC

## 2019-09-02 MED ORDER — ATORVASTATIN CALCIUM 40 MG PO TABS: 40.0000 mg | ORAL_TABLET | Freq: Every day | ORAL | Status: DC

## 2019-09-02 MED ORDER — NITROGLYCERIN 0.4 MG SL SUBL: 0.4000 mg | SUBLINGUAL_TABLET | SUBLINGUAL | 1 refills | Status: AC | PRN

## 2019-09-02 MED ORDER — MUPIROCIN 2 % EX OINT: 1.0000 "application " | TOPICAL_OINTMENT | Freq: Two times a day (BID) | CUTANEOUS | 0 refills | Status: DC

## 2019-09-02 MED ORDER — ISOSORBIDE MONONITRATE ER 30 MG PO TB24: 30.0000 mg | ORAL_TABLET | Freq: Every day | ORAL | Status: DC

## 2019-09-02 MED ORDER — METOPROLOL TARTRATE 50 MG PO TABS: 25.0000 mg | ORAL_TABLET | Freq: Two times a day (BID) | ORAL | Status: DC

## 2019-09-02 MED ORDER — ATORVASTATIN CALCIUM 40 MG PO TABS: 40.0000 mg | ORAL_TABLET | Freq: Every day | ORAL | 3 refills | Status: DC

## 2019-09-02 NOTE — Discharge Summary (Signed)
Physician Discharge Summary  Patient ID: [name] MRN: [id number] DOB/AGE: Jul 23, 1924 86 y.o.  Admit date: 08/30/2019 Discharge date: 09/02/2019  Admission Diagnoses: Atrial fibrillation with RVR, paroxysmal, CHA2DS2VASc score of 8 Abnormal troponin I, possible demand ischemia HTN Hypothyroidism S/P stroke  Discharge Diagnoses:  Principle Problem: Atrial fibrillation with RVR, Paroxysmal, CHA2DS2VASc score of 8 Active problem:   Chronic Diastolic left heart failure   Abnormal HS-Troponin I from demand ischemia   HTN   Hyperlipidemia   Hypothyroidism   S/P stroke   S/P breast cancer and surgery   IBS   Cervical degenerative arthropathy and spinal stenosis  Discharged Condition: fair  Hospital Course: 86 years old white female presented with palpitations and was found to have atrial fibrillation with RVR. She converted to sinus rhythm with IV metoprolol. She also has PMH of breast cancer with surgery, diastolic left heart failure, hypertension, hyperlipidemia, hypothyroidism, rectal cancer, IBS, low back pain and stroke.  She had minimally elevated troponin I levels. Her EKG showed Sinus rhythm, 1st. Degree AV block and LBBB. She preferred medical treatment. PO Plavix was added to low dose aspirin. She is somewhat high risk for bleeding with h/o stroke with micro hemorrhage. Pravastatin was changed to high dose atorvastatin.  She will see me in 1 week and Primary care in 2 weeks.  Consults: cardiology  Significant Diagnostic Studies: labs: CBC was near normal. BMET was near normal. Troponin I was 19 and 84 ng. LDL cholesterol was slightly high at 87 mg.  EKG: SR, 1st degree AV block and LBBB.  CXR: Cardiomegaly with chronic interstitial changes.  Echocardiogram: Preserved LV systolic function with moderate LVH, moderate MR, Mild TR.  Treatments: cardiac meds: Aspirin, Clopidogrel, Metoprolol, Amlodipine, Amiodarone, Atorvastatin, Furosemide, Isosorbide, Losartan, SL NTG  and Potassium.  Discharge Exam: Blood pressure (!) 133/56, pulse 78, temperature 98.1 F (36.7 C), temperature source Oral, resp. rate 20, height 5\' 6"  (1.676 m), weight 75.4 kg, SpO2 96 %. General appearance: alert, cooperative and appears stated age. Head: Normocephalic, atraumatic. Eyes: Blue eyes, pink conjunctiva, corneas clear. PERRL, EOM's intact.  Neck: No adenopathy, no carotid bruit, no JVD, supple, symmetrical, trachea midline and thyroid not enlarged. Resp: Clear to auscultation bilaterally. Cardio: Regular rate and rhythm, S1, S2 normal, II/VI systolic murmur, no click, rub or gallop. GI: Soft, non-tender; bowel sounds normal; no organomegaly. Extremities: No edema, cyanosis or clubbing. Skin: Warm and dry.  Neurologic: Alert and oriented X 3, normal strength and tone. Normal coordination.  Disposition: Discharge disposition: 01-Home or Self Care        Allergies as of 09/02/2019      Reactions   Codeine Itching   Pregabalin Other (See Comments)   REACTION: causes nervousness      Medication List    STOP taking these medications   pravastatin 20 MG tablet Commonly known as: PRAVACHOL     TAKE these medications   acetaminophen 500 MG tablet Commonly known as: TYLENOL Take 1,000 mg by mouth every 6 (six) hours as needed for headache (pain).   amiodarone 100 MG tablet Commonly known as: PACERONE Take 1 tablet (100 mg total) by mouth daily. Start taking on: September 03, 2019   amLODipine 5 MG tablet Commonly known as: NORVASC Take 1 tablet (5 mg total) by mouth daily.   aspirin EC 81 MG tablet Take 1 tablet (81 mg total) by mouth daily.   atorvastatin 40 MG tablet Commonly known as: LIPITOR Take 1 tablet (40 mg total) by mouth  daily at 6 PM.   clopidogrel 75 MG tablet Commonly known as: PLAVIX Take 1 tablet (75 mg total) by mouth daily.   diclofenac sodium 1 % Gel Commonly known as: VOLTAREN APPLY 2-4 GRAMS TO AFFECTED AREA 2-3 TIMES A DAY AS  NEEDED. What changed:   See the new instructions.  Another medication with the same name was removed. Continue taking this medication, and follow the directions you see here.   esomeprazole 40 MG capsule Commonly known as: NEXIUM Take 40 mg by mouth daily before breakfast. What changed: Another medication with the same name was removed. Continue taking this medication, and follow the directions you see here.   furosemide 40 MG tablet Commonly known as: LASIX Take 40 mg by mouth daily.   isosorbide mononitrate 30 MG 24 hr tablet Commonly known as: IMDUR Take 1 tablet (30 mg total) by mouth daily.   levothyroxine 50 MCG tablet Commonly known as: SYNTHROID Take 50 mcg by mouth daily.   losartan 25 MG tablet Commonly known as: COZAAR Take 1 tablet (25 mg total) by mouth daily. Start taking on: September 03, 2019   Melatonin 3 MG Tabs Take 3 mg by mouth at bedtime as needed (sleep).   metoprolol tartrate 50 MG tablet Commonly known as: LOPRESSOR Take 0.5 tablets (25 mg total) by mouth 2 (two) times daily. What changed: how much to take   mupirocin ointment 2 % Commonly known as: BACTROBAN Place 1 application into the nose 2 (two) times daily.   nitroGLYCERIN 0.4 MG SL tablet Commonly known as: NITROSTAT Place 1 tablet (0.4 mg total) under the tongue every 5 (five) minutes x 3 doses as needed for chest pain.   potassium chloride SA 20 MEQ tablet Commonly known as: Klor-Con M20 Take 1 tablet (20 mEq total) by mouth daily.   Systane 0.4-0.3 % Soln Generic drug: Polyethyl Glycol-Propyl Glycol Place 1 drop into both eyes every 2 (two) hours.   Systane 0.4-0.3 % Gel ophthalmic gel Generic drug: Polyethyl Glycol-Propyl Glycol Place 1 application into both eyes at bedtime.      Follow-up Information    [name], MD. Schedule an appointment as soon as possible for a visit in 2 week(s).   Specialty: Internal Medicine Contact information: [address] [place] [address] [phone]        Dixie Dials, MD. Schedule an appointment as soon as possible for a visit in 1 week(s).   Specialty: Cardiology Contact information: [place] [address] [phone]           Time spent: Review of old chart, current chart, lab, x-ray, cardiac tests and discussion with patient over 60 minutes.  Signed: Birdie Riddle 09/02/2019, 2:12 PM

## 2019-09-02 NOTE — Progress Notes (Signed)
ANTICOAGULATION CONSULT NOTE   Pharmacy Consult for Heparin  Indication: chest pain/ACS and Afib   Patient Measurements: Height: 5\' 6"  (167.6 cm) Weight: 166 lb 3.2 oz (75.4 kg) IBW/kg (Calculated) : 59.3 Heparin Dosing Weight: 75.7 kg  Vital Signs: Temp: 97.5 F (36.4 C) (03/01 0642) Temp Source: Oral (03/01 0642) BP: 146/64 (03/01 0831) Pulse Rate: 66 (03/01 0642)  Labs: Recent Labs    08/30/19 1202 08/30/19 1615 08/30/19 2234 08/31/19 0435 08/31/19 0435 09/01/19 0242 09/02/19 0257  HGB  --   --   --  11.2*   < > 11.6* 12.2  HCT  --   --   --  37.3  --  38.5 41.0  PLT  --   --   --  212  --  214 202  APTT  --  36  --   --   --   --   --   LABPROT  --  13.4  --  13.6  --   --   --   INR  --  1.0  --  1.1  --   --   --   HEPARINUNFRC  --   --    < > 0.56  --  0.27* 0.30  CREATININE  --   --   --  0.90  --   --   --   TROPONINIHS 84*  --   --   --   --   --   --    < > = values in this interval not displayed.    Estimated Creatinine Clearance: 41.4 mL/min (by C-G formula based on SCr of 0.9 mg/dL).   Assessment: 86 y/o female with ACS and afib RVR (CHADS2VASc = 7). No anticoagulation PTA.    Heparin level at low end of goal this am. H/H, plt stable. No issues with infusion and no bleeding per RN. 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 Continue heparin for now with decreased goal. Will increase rate slightly.  Goal of Therapy:  Heparin level 0.3-0.5 units/ml Monitor platelets by anticoagulation protocol: Yes   Plan:  Increase heparin to 1000 units/hr  Monitor daily HL, CBC, plt Monitor for signs/symptoms of bleeding   Erin Hearing PharmD., BCPS Clinical Pharmacist 09/02/2019 11:31 AM  Please check AMION for all [facility] phone numbers After 10:00 PM, call [place] [phone]

## 2019-09-10 DIAGNOSIS — I480 Paroxysmal atrial fibrillation: Secondary | ICD-10-CM | POA: Diagnosis not present

## 2019-09-10 DIAGNOSIS — I248 Other forms of acute ischemic heart disease: Secondary | ICD-10-CM | POA: Diagnosis not present

## 2019-09-30 DIAGNOSIS — I5032 Chronic diastolic (congestive) heart failure: Secondary | ICD-10-CM | POA: Diagnosis not present

## 2019-09-30 DIAGNOSIS — I480 Paroxysmal atrial fibrillation: Secondary | ICD-10-CM | POA: Diagnosis not present

## 2019-09-30 DIAGNOSIS — R072 Precordial pain: Secondary | ICD-10-CM | POA: Diagnosis not present

## 2019-09-30 DIAGNOSIS — I10 Essential (primary) hypertension: Secondary | ICD-10-CM | POA: Diagnosis not present

## 2019-10-30 DIAGNOSIS — Z20828 Contact with and (suspected) exposure to other viral communicable diseases: Secondary | ICD-10-CM | POA: Diagnosis not present

## 2019-10-30 DIAGNOSIS — Z1159 Encounter for screening for other viral diseases: Secondary | ICD-10-CM | POA: Diagnosis not present

## 2019-11-06 DIAGNOSIS — Z20828 Contact with and (suspected) exposure to other viral communicable diseases: Secondary | ICD-10-CM | POA: Diagnosis not present

## 2019-11-06 DIAGNOSIS — Z1159 Encounter for screening for other viral diseases: Secondary | ICD-10-CM | POA: Diagnosis not present

## 2019-11-13 DIAGNOSIS — Z1159 Encounter for screening for other viral diseases: Secondary | ICD-10-CM | POA: Diagnosis not present

## 2019-11-13 DIAGNOSIS — Z20828 Contact with and (suspected) exposure to other viral communicable diseases: Secondary | ICD-10-CM | POA: Diagnosis not present

## 2019-11-20 DIAGNOSIS — Z1159 Encounter for screening for other viral diseases: Secondary | ICD-10-CM | POA: Diagnosis not present

## 2019-11-20 DIAGNOSIS — Z20828 Contact with and (suspected) exposure to other viral communicable diseases: Secondary | ICD-10-CM | POA: Diagnosis not present

## 2019-11-27 DIAGNOSIS — Z20828 Contact with and (suspected) exposure to other viral communicable diseases: Secondary | ICD-10-CM | POA: Diagnosis not present

## 2019-11-27 DIAGNOSIS — Z1159 Encounter for screening for other viral diseases: Secondary | ICD-10-CM | POA: Diagnosis not present

## 2019-12-09 DIAGNOSIS — R350 Frequency of micturition: Secondary | ICD-10-CM | POA: Diagnosis not present

## 2019-12-09 DIAGNOSIS — G4700 Insomnia, unspecified: Secondary | ICD-10-CM | POA: Diagnosis not present

## 2019-12-09 DIAGNOSIS — I480 Paroxysmal atrial fibrillation: Secondary | ICD-10-CM | POA: Diagnosis not present

## 2019-12-09 DIAGNOSIS — I509 Heart failure, unspecified: Secondary | ICD-10-CM | POA: Diagnosis not present

## 2019-12-09 DIAGNOSIS — R5383 Other fatigue: Secondary | ICD-10-CM | POA: Diagnosis not present

## 2019-12-09 DIAGNOSIS — E7800 Pure hypercholesterolemia, unspecified: Secondary | ICD-10-CM | POA: Diagnosis not present

## 2019-12-09 DIAGNOSIS — I10 Essential (primary) hypertension: Secondary | ICD-10-CM | POA: Diagnosis not present

## 2019-12-24 DIAGNOSIS — N390 Urinary tract infection, site not specified: Secondary | ICD-10-CM | POA: Diagnosis not present

## 2020-01-08 DIAGNOSIS — Z1159 Encounter for screening for other viral diseases: Secondary | ICD-10-CM | POA: Diagnosis not present

## 2020-01-08 DIAGNOSIS — Z20828 Contact with and (suspected) exposure to other viral communicable diseases: Secondary | ICD-10-CM | POA: Diagnosis not present

## 2020-01-15 DIAGNOSIS — Z20828 Contact with and (suspected) exposure to other viral communicable diseases: Secondary | ICD-10-CM | POA: Diagnosis not present

## 2020-01-15 DIAGNOSIS — Z1159 Encounter for screening for other viral diseases: Secondary | ICD-10-CM | POA: Diagnosis not present

## 2020-01-22 DIAGNOSIS — Z20828 Contact with and (suspected) exposure to other viral communicable diseases: Secondary | ICD-10-CM | POA: Diagnosis not present

## 2020-01-22 DIAGNOSIS — Z1159 Encounter for screening for other viral diseases: Secondary | ICD-10-CM | POA: Diagnosis not present

## 2020-01-29 DIAGNOSIS — Z1159 Encounter for screening for other viral diseases: Secondary | ICD-10-CM | POA: Diagnosis not present

## 2020-01-29 DIAGNOSIS — Z20828 Contact with and (suspected) exposure to other viral communicable diseases: Secondary | ICD-10-CM | POA: Diagnosis not present

## 2020-01-31 DIAGNOSIS — M6281 Muscle weakness (generalized): Secondary | ICD-10-CM | POA: Diagnosis not present

## 2020-01-31 DIAGNOSIS — R2681 Unsteadiness on feet: Secondary | ICD-10-CM | POA: Diagnosis not present

## 2020-02-03 DIAGNOSIS — R41841 Cognitive communication deficit: Secondary | ICD-10-CM | POA: Diagnosis not present

## 2020-02-03 DIAGNOSIS — M6281 Muscle weakness (generalized): Secondary | ICD-10-CM | POA: Diagnosis not present

## 2020-02-03 DIAGNOSIS — R278 Other lack of coordination: Secondary | ICD-10-CM | POA: Diagnosis not present

## 2020-02-03 DIAGNOSIS — R488 Other symbolic dysfunctions: Secondary | ICD-10-CM | POA: Diagnosis not present

## 2020-02-03 DIAGNOSIS — R2681 Unsteadiness on feet: Secondary | ICD-10-CM | POA: Diagnosis not present

## 2020-02-04 DIAGNOSIS — R41841 Cognitive communication deficit: Secondary | ICD-10-CM | POA: Diagnosis not present

## 2020-02-04 DIAGNOSIS — R278 Other lack of coordination: Secondary | ICD-10-CM | POA: Diagnosis not present

## 2020-02-04 DIAGNOSIS — R488 Other symbolic dysfunctions: Secondary | ICD-10-CM | POA: Diagnosis not present

## 2020-02-04 DIAGNOSIS — M6281 Muscle weakness (generalized): Secondary | ICD-10-CM | POA: Diagnosis not present

## 2020-02-04 DIAGNOSIS — R2681 Unsteadiness on feet: Secondary | ICD-10-CM | POA: Diagnosis not present

## 2020-02-06 DIAGNOSIS — R278 Other lack of coordination: Secondary | ICD-10-CM | POA: Diagnosis not present

## 2020-02-06 DIAGNOSIS — R488 Other symbolic dysfunctions: Secondary | ICD-10-CM | POA: Diagnosis not present

## 2020-02-06 DIAGNOSIS — R41841 Cognitive communication deficit: Secondary | ICD-10-CM | POA: Diagnosis not present

## 2020-02-06 DIAGNOSIS — R2681 Unsteadiness on feet: Secondary | ICD-10-CM | POA: Diagnosis not present

## 2020-02-06 DIAGNOSIS — M6281 Muscle weakness (generalized): Secondary | ICD-10-CM | POA: Diagnosis not present

## 2020-02-10 DIAGNOSIS — R41841 Cognitive communication deficit: Secondary | ICD-10-CM | POA: Diagnosis not present

## 2020-02-10 DIAGNOSIS — M6281 Muscle weakness (generalized): Secondary | ICD-10-CM | POA: Diagnosis not present

## 2020-02-10 DIAGNOSIS — R278 Other lack of coordination: Secondary | ICD-10-CM | POA: Diagnosis not present

## 2020-02-10 DIAGNOSIS — R488 Other symbolic dysfunctions: Secondary | ICD-10-CM | POA: Diagnosis not present

## 2020-02-10 DIAGNOSIS — R2681 Unsteadiness on feet: Secondary | ICD-10-CM | POA: Diagnosis not present

## 2020-02-11 DIAGNOSIS — M6281 Muscle weakness (generalized): Secondary | ICD-10-CM | POA: Diagnosis not present

## 2020-02-11 DIAGNOSIS — R41841 Cognitive communication deficit: Secondary | ICD-10-CM | POA: Diagnosis not present

## 2020-02-11 DIAGNOSIS — R2681 Unsteadiness on feet: Secondary | ICD-10-CM | POA: Diagnosis not present

## 2020-02-11 DIAGNOSIS — R488 Other symbolic dysfunctions: Secondary | ICD-10-CM | POA: Diagnosis not present

## 2020-02-11 DIAGNOSIS — R278 Other lack of coordination: Secondary | ICD-10-CM | POA: Diagnosis not present

## 2020-02-12 DIAGNOSIS — R41841 Cognitive communication deficit: Secondary | ICD-10-CM | POA: Diagnosis not present

## 2020-02-12 DIAGNOSIS — M6281 Muscle weakness (generalized): Secondary | ICD-10-CM | POA: Diagnosis not present

## 2020-02-12 DIAGNOSIS — R2681 Unsteadiness on feet: Secondary | ICD-10-CM | POA: Diagnosis not present

## 2020-02-12 DIAGNOSIS — R488 Other symbolic dysfunctions: Secondary | ICD-10-CM | POA: Diagnosis not present

## 2020-02-12 DIAGNOSIS — R278 Other lack of coordination: Secondary | ICD-10-CM | POA: Diagnosis not present

## 2020-02-14 DIAGNOSIS — R2681 Unsteadiness on feet: Secondary | ICD-10-CM | POA: Diagnosis not present

## 2020-02-14 DIAGNOSIS — R278 Other lack of coordination: Secondary | ICD-10-CM | POA: Diagnosis not present

## 2020-02-14 DIAGNOSIS — M6281 Muscle weakness (generalized): Secondary | ICD-10-CM | POA: Diagnosis not present

## 2020-02-14 DIAGNOSIS — R488 Other symbolic dysfunctions: Secondary | ICD-10-CM | POA: Diagnosis not present

## 2020-02-14 DIAGNOSIS — R41841 Cognitive communication deficit: Secondary | ICD-10-CM | POA: Diagnosis not present

## 2020-02-17 DIAGNOSIS — R41841 Cognitive communication deficit: Secondary | ICD-10-CM | POA: Diagnosis not present

## 2020-02-17 DIAGNOSIS — M6281 Muscle weakness (generalized): Secondary | ICD-10-CM | POA: Diagnosis not present

## 2020-02-17 DIAGNOSIS — R278 Other lack of coordination: Secondary | ICD-10-CM | POA: Diagnosis not present

## 2020-02-17 DIAGNOSIS — R488 Other symbolic dysfunctions: Secondary | ICD-10-CM | POA: Diagnosis not present

## 2020-02-17 DIAGNOSIS — R2681 Unsteadiness on feet: Secondary | ICD-10-CM | POA: Diagnosis not present

## 2020-02-18 DIAGNOSIS — R41841 Cognitive communication deficit: Secondary | ICD-10-CM | POA: Diagnosis not present

## 2020-02-18 DIAGNOSIS — M6281 Muscle weakness (generalized): Secondary | ICD-10-CM | POA: Diagnosis not present

## 2020-02-18 DIAGNOSIS — R278 Other lack of coordination: Secondary | ICD-10-CM | POA: Diagnosis not present

## 2020-02-18 DIAGNOSIS — R488 Other symbolic dysfunctions: Secondary | ICD-10-CM | POA: Diagnosis not present

## 2020-02-18 DIAGNOSIS — R2681 Unsteadiness on feet: Secondary | ICD-10-CM | POA: Diagnosis not present

## 2020-02-19 DIAGNOSIS — R488 Other symbolic dysfunctions: Secondary | ICD-10-CM | POA: Diagnosis not present

## 2020-02-19 DIAGNOSIS — Z20828 Contact with and (suspected) exposure to other viral communicable diseases: Secondary | ICD-10-CM | POA: Diagnosis not present

## 2020-02-19 DIAGNOSIS — Z1159 Encounter for screening for other viral diseases: Secondary | ICD-10-CM | POA: Diagnosis not present

## 2020-02-19 DIAGNOSIS — R2681 Unsteadiness on feet: Secondary | ICD-10-CM | POA: Diagnosis not present

## 2020-02-19 DIAGNOSIS — R278 Other lack of coordination: Secondary | ICD-10-CM | POA: Diagnosis not present

## 2020-02-19 DIAGNOSIS — M6281 Muscle weakness (generalized): Secondary | ICD-10-CM | POA: Diagnosis not present

## 2020-02-19 DIAGNOSIS — R41841 Cognitive communication deficit: Secondary | ICD-10-CM | POA: Diagnosis not present

## 2020-02-20 DIAGNOSIS — R488 Other symbolic dysfunctions: Secondary | ICD-10-CM | POA: Diagnosis not present

## 2020-02-20 DIAGNOSIS — R278 Other lack of coordination: Secondary | ICD-10-CM | POA: Diagnosis not present

## 2020-02-20 DIAGNOSIS — M6281 Muscle weakness (generalized): Secondary | ICD-10-CM | POA: Diagnosis not present

## 2020-02-20 DIAGNOSIS — R41841 Cognitive communication deficit: Secondary | ICD-10-CM | POA: Diagnosis not present

## 2020-02-20 DIAGNOSIS — R2681 Unsteadiness on feet: Secondary | ICD-10-CM | POA: Diagnosis not present

## 2020-02-21 DIAGNOSIS — R41841 Cognitive communication deficit: Secondary | ICD-10-CM | POA: Diagnosis not present

## 2020-02-21 DIAGNOSIS — R2681 Unsteadiness on feet: Secondary | ICD-10-CM | POA: Diagnosis not present

## 2020-02-21 DIAGNOSIS — R488 Other symbolic dysfunctions: Secondary | ICD-10-CM | POA: Diagnosis not present

## 2020-02-21 DIAGNOSIS — M6281 Muscle weakness (generalized): Secondary | ICD-10-CM | POA: Diagnosis not present

## 2020-02-21 DIAGNOSIS — R278 Other lack of coordination: Secondary | ICD-10-CM | POA: Diagnosis not present

## 2020-02-24 DIAGNOSIS — R2681 Unsteadiness on feet: Secondary | ICD-10-CM | POA: Diagnosis not present

## 2020-02-24 DIAGNOSIS — R278 Other lack of coordination: Secondary | ICD-10-CM | POA: Diagnosis not present

## 2020-02-24 DIAGNOSIS — R488 Other symbolic dysfunctions: Secondary | ICD-10-CM | POA: Diagnosis not present

## 2020-02-24 DIAGNOSIS — R41841 Cognitive communication deficit: Secondary | ICD-10-CM | POA: Diagnosis not present

## 2020-02-24 DIAGNOSIS — M6281 Muscle weakness (generalized): Secondary | ICD-10-CM | POA: Diagnosis not present

## 2020-02-25 DIAGNOSIS — R2681 Unsteadiness on feet: Secondary | ICD-10-CM | POA: Diagnosis not present

## 2020-02-25 DIAGNOSIS — M6281 Muscle weakness (generalized): Secondary | ICD-10-CM | POA: Diagnosis not present

## 2020-02-25 DIAGNOSIS — R278 Other lack of coordination: Secondary | ICD-10-CM | POA: Diagnosis not present

## 2020-02-25 DIAGNOSIS — R41841 Cognitive communication deficit: Secondary | ICD-10-CM | POA: Diagnosis not present

## 2020-02-25 DIAGNOSIS — R488 Other symbolic dysfunctions: Secondary | ICD-10-CM | POA: Diagnosis not present

## 2020-02-26 DIAGNOSIS — R41841 Cognitive communication deficit: Secondary | ICD-10-CM | POA: Diagnosis not present

## 2020-02-26 DIAGNOSIS — M6281 Muscle weakness (generalized): Secondary | ICD-10-CM | POA: Diagnosis not present

## 2020-02-26 DIAGNOSIS — R278 Other lack of coordination: Secondary | ICD-10-CM | POA: Diagnosis not present

## 2020-02-26 DIAGNOSIS — R2681 Unsteadiness on feet: Secondary | ICD-10-CM | POA: Diagnosis not present

## 2020-02-26 DIAGNOSIS — R488 Other symbolic dysfunctions: Secondary | ICD-10-CM | POA: Diagnosis not present

## 2020-02-28 DIAGNOSIS — R41841 Cognitive communication deficit: Secondary | ICD-10-CM | POA: Diagnosis not present

## 2020-02-28 DIAGNOSIS — R2681 Unsteadiness on feet: Secondary | ICD-10-CM | POA: Diagnosis not present

## 2020-02-28 DIAGNOSIS — M6281 Muscle weakness (generalized): Secondary | ICD-10-CM | POA: Diagnosis not present

## 2020-02-28 DIAGNOSIS — R488 Other symbolic dysfunctions: Secondary | ICD-10-CM | POA: Diagnosis not present

## 2020-02-28 DIAGNOSIS — R278 Other lack of coordination: Secondary | ICD-10-CM | POA: Diagnosis not present

## 2020-03-02 DIAGNOSIS — R278 Other lack of coordination: Secondary | ICD-10-CM | POA: Diagnosis not present

## 2020-03-02 DIAGNOSIS — R488 Other symbolic dysfunctions: Secondary | ICD-10-CM | POA: Diagnosis not present

## 2020-03-02 DIAGNOSIS — M6281 Muscle weakness (generalized): Secondary | ICD-10-CM | POA: Diagnosis not present

## 2020-03-02 DIAGNOSIS — R41841 Cognitive communication deficit: Secondary | ICD-10-CM | POA: Diagnosis not present

## 2020-03-02 DIAGNOSIS — R2681 Unsteadiness on feet: Secondary | ICD-10-CM | POA: Diagnosis not present

## 2020-03-03 DIAGNOSIS — R2681 Unsteadiness on feet: Secondary | ICD-10-CM | POA: Diagnosis not present

## 2020-03-03 DIAGNOSIS — R278 Other lack of coordination: Secondary | ICD-10-CM | POA: Diagnosis not present

## 2020-03-03 DIAGNOSIS — R488 Other symbolic dysfunctions: Secondary | ICD-10-CM | POA: Diagnosis not present

## 2020-03-03 DIAGNOSIS — M6281 Muscle weakness (generalized): Secondary | ICD-10-CM | POA: Diagnosis not present

## 2020-03-03 DIAGNOSIS — R41841 Cognitive communication deficit: Secondary | ICD-10-CM | POA: Diagnosis not present

## 2020-03-04 DIAGNOSIS — R2681 Unsteadiness on feet: Secondary | ICD-10-CM | POA: Diagnosis not present

## 2020-03-04 DIAGNOSIS — M6281 Muscle weakness (generalized): Secondary | ICD-10-CM | POA: Diagnosis not present

## 2020-03-04 DIAGNOSIS — R41841 Cognitive communication deficit: Secondary | ICD-10-CM | POA: Diagnosis not present

## 2020-03-04 DIAGNOSIS — R488 Other symbolic dysfunctions: Secondary | ICD-10-CM | POA: Diagnosis not present

## 2020-03-04 DIAGNOSIS — R278 Other lack of coordination: Secondary | ICD-10-CM | POA: Diagnosis not present

## 2020-03-05 DIAGNOSIS — M6281 Muscle weakness (generalized): Secondary | ICD-10-CM | POA: Diagnosis not present

## 2020-03-05 DIAGNOSIS — R41841 Cognitive communication deficit: Secondary | ICD-10-CM | POA: Diagnosis not present

## 2020-03-05 DIAGNOSIS — R278 Other lack of coordination: Secondary | ICD-10-CM | POA: Diagnosis not present

## 2020-03-05 DIAGNOSIS — R488 Other symbolic dysfunctions: Secondary | ICD-10-CM | POA: Diagnosis not present

## 2020-03-05 DIAGNOSIS — R2681 Unsteadiness on feet: Secondary | ICD-10-CM | POA: Diagnosis not present

## 2020-03-06 DIAGNOSIS — R278 Other lack of coordination: Secondary | ICD-10-CM | POA: Diagnosis not present

## 2020-03-06 DIAGNOSIS — R41841 Cognitive communication deficit: Secondary | ICD-10-CM | POA: Diagnosis not present

## 2020-03-06 DIAGNOSIS — R488 Other symbolic dysfunctions: Secondary | ICD-10-CM | POA: Diagnosis not present

## 2020-03-06 DIAGNOSIS — M6281 Muscle weakness (generalized): Secondary | ICD-10-CM | POA: Diagnosis not present

## 2020-03-06 DIAGNOSIS — R2681 Unsteadiness on feet: Secondary | ICD-10-CM | POA: Diagnosis not present

## 2020-03-10 ENCOUNTER — Other Ambulatory Visit: Payer: Self-pay | Admitting: Internal Medicine

## 2020-03-10 ENCOUNTER — Ambulatory Visit
Admission: RE | Admit: 2020-03-10 | Discharge: 2020-03-10 | Disposition: A | Discharge status: Home / Self Care | Payer: Medicare Other | Source: Ambulatory Visit | Attending: Internal Medicine | Admitting: Internal Medicine

## 2020-03-10 DIAGNOSIS — R413 Other amnesia: Secondary | ICD-10-CM | POA: Diagnosis not present

## 2020-03-10 DIAGNOSIS — I10 Essential (primary) hypertension: Secondary | ICD-10-CM | POA: Diagnosis not present

## 2020-03-10 DIAGNOSIS — M25512 Pain in left shoulder: Secondary | ICD-10-CM

## 2020-03-10 DIAGNOSIS — M19012 Primary osteoarthritis, left shoulder: Secondary | ICD-10-CM | POA: Diagnosis not present

## 2020-03-10 DIAGNOSIS — I480 Paroxysmal atrial fibrillation: Secondary | ICD-10-CM | POA: Diagnosis not present

## 2020-03-11 ENCOUNTER — Encounter: Payer: Self-pay | Admitting: Neurology

## 2020-03-11 DIAGNOSIS — M6281 Muscle weakness (generalized): Secondary | ICD-10-CM | POA: Diagnosis not present

## 2020-03-11 DIAGNOSIS — R488 Other symbolic dysfunctions: Secondary | ICD-10-CM | POA: Diagnosis not present

## 2020-03-11 DIAGNOSIS — R278 Other lack of coordination: Secondary | ICD-10-CM | POA: Diagnosis not present

## 2020-03-11 DIAGNOSIS — R2681 Unsteadiness on feet: Secondary | ICD-10-CM | POA: Diagnosis not present

## 2020-03-11 DIAGNOSIS — R41841 Cognitive communication deficit: Secondary | ICD-10-CM | POA: Diagnosis not present

## 2020-03-12 DIAGNOSIS — R278 Other lack of coordination: Secondary | ICD-10-CM | POA: Diagnosis not present

## 2020-03-12 DIAGNOSIS — R488 Other symbolic dysfunctions: Secondary | ICD-10-CM | POA: Diagnosis not present

## 2020-03-12 DIAGNOSIS — M6281 Muscle weakness (generalized): Secondary | ICD-10-CM | POA: Diagnosis not present

## 2020-03-12 DIAGNOSIS — R2681 Unsteadiness on feet: Secondary | ICD-10-CM | POA: Diagnosis not present

## 2020-03-12 DIAGNOSIS — R41841 Cognitive communication deficit: Secondary | ICD-10-CM | POA: Diagnosis not present

## 2020-03-13 DIAGNOSIS — R41841 Cognitive communication deficit: Secondary | ICD-10-CM | POA: Diagnosis not present

## 2020-03-13 DIAGNOSIS — R488 Other symbolic dysfunctions: Secondary | ICD-10-CM | POA: Diagnosis not present

## 2020-03-13 DIAGNOSIS — M6281 Muscle weakness (generalized): Secondary | ICD-10-CM | POA: Diagnosis not present

## 2020-03-13 DIAGNOSIS — R2681 Unsteadiness on feet: Secondary | ICD-10-CM | POA: Diagnosis not present

## 2020-03-13 DIAGNOSIS — R278 Other lack of coordination: Secondary | ICD-10-CM | POA: Diagnosis not present

## 2020-03-16 DIAGNOSIS — R488 Other symbolic dysfunctions: Secondary | ICD-10-CM | POA: Diagnosis not present

## 2020-03-16 DIAGNOSIS — R278 Other lack of coordination: Secondary | ICD-10-CM | POA: Diagnosis not present

## 2020-03-16 DIAGNOSIS — R41841 Cognitive communication deficit: Secondary | ICD-10-CM | POA: Diagnosis not present

## 2020-03-16 DIAGNOSIS — R2681 Unsteadiness on feet: Secondary | ICD-10-CM | POA: Diagnosis not present

## 2020-03-16 DIAGNOSIS — M6281 Muscle weakness (generalized): Secondary | ICD-10-CM | POA: Diagnosis not present

## 2020-03-17 DIAGNOSIS — R41841 Cognitive communication deficit: Secondary | ICD-10-CM | POA: Diagnosis not present

## 2020-03-17 DIAGNOSIS — M6281 Muscle weakness (generalized): Secondary | ICD-10-CM | POA: Diagnosis not present

## 2020-03-17 DIAGNOSIS — R488 Other symbolic dysfunctions: Secondary | ICD-10-CM | POA: Diagnosis not present

## 2020-03-17 DIAGNOSIS — R278 Other lack of coordination: Secondary | ICD-10-CM | POA: Diagnosis not present

## 2020-03-17 DIAGNOSIS — R2681 Unsteadiness on feet: Secondary | ICD-10-CM | POA: Diagnosis not present

## 2020-03-18 DIAGNOSIS — R488 Other symbolic dysfunctions: Secondary | ICD-10-CM | POA: Diagnosis not present

## 2020-03-18 DIAGNOSIS — M6281 Muscle weakness (generalized): Secondary | ICD-10-CM | POA: Diagnosis not present

## 2020-03-18 DIAGNOSIS — R2681 Unsteadiness on feet: Secondary | ICD-10-CM | POA: Diagnosis not present

## 2020-03-18 DIAGNOSIS — R41841 Cognitive communication deficit: Secondary | ICD-10-CM | POA: Diagnosis not present

## 2020-03-18 DIAGNOSIS — R278 Other lack of coordination: Secondary | ICD-10-CM | POA: Diagnosis not present

## 2020-03-19 DIAGNOSIS — R41841 Cognitive communication deficit: Secondary | ICD-10-CM | POA: Diagnosis not present

## 2020-03-19 DIAGNOSIS — R278 Other lack of coordination: Secondary | ICD-10-CM | POA: Diagnosis not present

## 2020-03-19 DIAGNOSIS — M6281 Muscle weakness (generalized): Secondary | ICD-10-CM | POA: Diagnosis not present

## 2020-03-19 DIAGNOSIS — R2681 Unsteadiness on feet: Secondary | ICD-10-CM | POA: Diagnosis not present

## 2020-03-19 DIAGNOSIS — R488 Other symbolic dysfunctions: Secondary | ICD-10-CM | POA: Diagnosis not present

## 2020-03-20 DIAGNOSIS — R278 Other lack of coordination: Secondary | ICD-10-CM | POA: Diagnosis not present

## 2020-03-20 DIAGNOSIS — R41841 Cognitive communication deficit: Secondary | ICD-10-CM | POA: Diagnosis not present

## 2020-03-20 DIAGNOSIS — R2681 Unsteadiness on feet: Secondary | ICD-10-CM | POA: Diagnosis not present

## 2020-03-20 DIAGNOSIS — M6281 Muscle weakness (generalized): Secondary | ICD-10-CM | POA: Diagnosis not present

## 2020-03-20 DIAGNOSIS — R488 Other symbolic dysfunctions: Secondary | ICD-10-CM | POA: Diagnosis not present

## 2020-03-23 DIAGNOSIS — R2681 Unsteadiness on feet: Secondary | ICD-10-CM | POA: Diagnosis not present

## 2020-03-23 DIAGNOSIS — R488 Other symbolic dysfunctions: Secondary | ICD-10-CM | POA: Diagnosis not present

## 2020-03-23 DIAGNOSIS — M6281 Muscle weakness (generalized): Secondary | ICD-10-CM | POA: Diagnosis not present

## 2020-03-23 DIAGNOSIS — R278 Other lack of coordination: Secondary | ICD-10-CM | POA: Diagnosis not present

## 2020-03-23 DIAGNOSIS — R41841 Cognitive communication deficit: Secondary | ICD-10-CM | POA: Diagnosis not present

## 2020-03-24 DIAGNOSIS — R488 Other symbolic dysfunctions: Secondary | ICD-10-CM | POA: Diagnosis not present

## 2020-03-24 DIAGNOSIS — M6281 Muscle weakness (generalized): Secondary | ICD-10-CM | POA: Diagnosis not present

## 2020-03-24 DIAGNOSIS — R278 Other lack of coordination: Secondary | ICD-10-CM | POA: Diagnosis not present

## 2020-03-24 DIAGNOSIS — R2681 Unsteadiness on feet: Secondary | ICD-10-CM | POA: Diagnosis not present

## 2020-03-24 DIAGNOSIS — R41841 Cognitive communication deficit: Secondary | ICD-10-CM | POA: Diagnosis not present

## 2020-03-26 DIAGNOSIS — R278 Other lack of coordination: Secondary | ICD-10-CM | POA: Diagnosis not present

## 2020-03-26 DIAGNOSIS — R2681 Unsteadiness on feet: Secondary | ICD-10-CM | POA: Diagnosis not present

## 2020-03-26 DIAGNOSIS — M6281 Muscle weakness (generalized): Secondary | ICD-10-CM | POA: Diagnosis not present

## 2020-03-26 DIAGNOSIS — R488 Other symbolic dysfunctions: Secondary | ICD-10-CM | POA: Diagnosis not present

## 2020-03-26 DIAGNOSIS — R41841 Cognitive communication deficit: Secondary | ICD-10-CM | POA: Diagnosis not present

## 2020-03-27 DIAGNOSIS — R278 Other lack of coordination: Secondary | ICD-10-CM | POA: Diagnosis not present

## 2020-03-27 DIAGNOSIS — R488 Other symbolic dysfunctions: Secondary | ICD-10-CM | POA: Diagnosis not present

## 2020-03-27 DIAGNOSIS — M6281 Muscle weakness (generalized): Secondary | ICD-10-CM | POA: Diagnosis not present

## 2020-03-27 DIAGNOSIS — R2681 Unsteadiness on feet: Secondary | ICD-10-CM | POA: Diagnosis not present

## 2020-03-27 DIAGNOSIS — R41841 Cognitive communication deficit: Secondary | ICD-10-CM | POA: Diagnosis not present

## 2020-03-30 DIAGNOSIS — R41841 Cognitive communication deficit: Secondary | ICD-10-CM | POA: Diagnosis not present

## 2020-03-30 DIAGNOSIS — M6281 Muscle weakness (generalized): Secondary | ICD-10-CM | POA: Diagnosis not present

## 2020-03-30 DIAGNOSIS — R278 Other lack of coordination: Secondary | ICD-10-CM | POA: Diagnosis not present

## 2020-03-30 DIAGNOSIS — R488 Other symbolic dysfunctions: Secondary | ICD-10-CM | POA: Diagnosis not present

## 2020-03-30 DIAGNOSIS — R2681 Unsteadiness on feet: Secondary | ICD-10-CM | POA: Diagnosis not present

## 2020-03-31 DIAGNOSIS — M6281 Muscle weakness (generalized): Secondary | ICD-10-CM | POA: Diagnosis not present

## 2020-03-31 DIAGNOSIS — R2681 Unsteadiness on feet: Secondary | ICD-10-CM | POA: Diagnosis not present

## 2020-03-31 DIAGNOSIS — R488 Other symbolic dysfunctions: Secondary | ICD-10-CM | POA: Diagnosis not present

## 2020-03-31 DIAGNOSIS — R278 Other lack of coordination: Secondary | ICD-10-CM | POA: Diagnosis not present

## 2020-03-31 DIAGNOSIS — R41841 Cognitive communication deficit: Secondary | ICD-10-CM | POA: Diagnosis not present

## 2020-04-01 DIAGNOSIS — R2681 Unsteadiness on feet: Secondary | ICD-10-CM | POA: Diagnosis not present

## 2020-04-01 DIAGNOSIS — R41841 Cognitive communication deficit: Secondary | ICD-10-CM | POA: Diagnosis not present

## 2020-04-01 DIAGNOSIS — R488 Other symbolic dysfunctions: Secondary | ICD-10-CM | POA: Diagnosis not present

## 2020-04-01 DIAGNOSIS — R278 Other lack of coordination: Secondary | ICD-10-CM | POA: Diagnosis not present

## 2020-04-01 DIAGNOSIS — M6281 Muscle weakness (generalized): Secondary | ICD-10-CM | POA: Diagnosis not present

## 2020-04-02 DIAGNOSIS — M6281 Muscle weakness (generalized): Secondary | ICD-10-CM | POA: Diagnosis not present

## 2020-04-02 DIAGNOSIS — R278 Other lack of coordination: Secondary | ICD-10-CM | POA: Diagnosis not present

## 2020-04-02 DIAGNOSIS — R488 Other symbolic dysfunctions: Secondary | ICD-10-CM | POA: Diagnosis not present

## 2020-04-02 DIAGNOSIS — R41841 Cognitive communication deficit: Secondary | ICD-10-CM | POA: Diagnosis not present

## 2020-04-02 DIAGNOSIS — R2681 Unsteadiness on feet: Secondary | ICD-10-CM | POA: Diagnosis not present

## 2020-04-03 DIAGNOSIS — M6281 Muscle weakness (generalized): Secondary | ICD-10-CM | POA: Diagnosis not present

## 2020-04-03 DIAGNOSIS — R278 Other lack of coordination: Secondary | ICD-10-CM | POA: Diagnosis not present

## 2020-04-03 DIAGNOSIS — R488 Other symbolic dysfunctions: Secondary | ICD-10-CM | POA: Diagnosis not present

## 2020-04-03 DIAGNOSIS — R2681 Unsteadiness on feet: Secondary | ICD-10-CM | POA: Diagnosis not present

## 2020-04-03 DIAGNOSIS — R41841 Cognitive communication deficit: Secondary | ICD-10-CM | POA: Diagnosis not present

## 2020-04-06 DIAGNOSIS — R488 Other symbolic dysfunctions: Secondary | ICD-10-CM | POA: Diagnosis not present

## 2020-04-06 DIAGNOSIS — M6281 Muscle weakness (generalized): Secondary | ICD-10-CM | POA: Diagnosis not present

## 2020-04-06 DIAGNOSIS — R2681 Unsteadiness on feet: Secondary | ICD-10-CM | POA: Diagnosis not present

## 2020-04-06 DIAGNOSIS — R41841 Cognitive communication deficit: Secondary | ICD-10-CM | POA: Diagnosis not present

## 2020-04-06 DIAGNOSIS — R278 Other lack of coordination: Secondary | ICD-10-CM | POA: Diagnosis not present

## 2020-04-07 DIAGNOSIS — Z1159 Encounter for screening for other viral diseases: Secondary | ICD-10-CM | POA: Diagnosis not present

## 2020-04-07 DIAGNOSIS — Z20828 Contact with and (suspected) exposure to other viral communicable diseases: Secondary | ICD-10-CM | POA: Diagnosis not present

## 2020-04-07 DIAGNOSIS — M6281 Muscle weakness (generalized): Secondary | ICD-10-CM | POA: Diagnosis not present

## 2020-04-07 DIAGNOSIS — R278 Other lack of coordination: Secondary | ICD-10-CM | POA: Diagnosis not present

## 2020-04-07 DIAGNOSIS — R2681 Unsteadiness on feet: Secondary | ICD-10-CM | POA: Diagnosis not present

## 2020-04-07 DIAGNOSIS — R488 Other symbolic dysfunctions: Secondary | ICD-10-CM | POA: Diagnosis not present

## 2020-04-07 DIAGNOSIS — R41841 Cognitive communication deficit: Secondary | ICD-10-CM | POA: Diagnosis not present

## 2020-04-08 DIAGNOSIS — M6281 Muscle weakness (generalized): Secondary | ICD-10-CM | POA: Diagnosis not present

## 2020-04-08 DIAGNOSIS — R488 Other symbolic dysfunctions: Secondary | ICD-10-CM | POA: Diagnosis not present

## 2020-04-08 DIAGNOSIS — R2681 Unsteadiness on feet: Secondary | ICD-10-CM | POA: Diagnosis not present

## 2020-04-08 DIAGNOSIS — R278 Other lack of coordination: Secondary | ICD-10-CM | POA: Diagnosis not present

## 2020-04-08 DIAGNOSIS — R41841 Cognitive communication deficit: Secondary | ICD-10-CM | POA: Diagnosis not present

## 2020-04-09 DIAGNOSIS — R278 Other lack of coordination: Secondary | ICD-10-CM | POA: Diagnosis not present

## 2020-04-09 DIAGNOSIS — R488 Other symbolic dysfunctions: Secondary | ICD-10-CM | POA: Diagnosis not present

## 2020-04-09 DIAGNOSIS — R2681 Unsteadiness on feet: Secondary | ICD-10-CM | POA: Diagnosis not present

## 2020-04-09 DIAGNOSIS — R41841 Cognitive communication deficit: Secondary | ICD-10-CM | POA: Diagnosis not present

## 2020-04-09 DIAGNOSIS — M6281 Muscle weakness (generalized): Secondary | ICD-10-CM | POA: Diagnosis not present

## 2020-04-10 DIAGNOSIS — R278 Other lack of coordination: Secondary | ICD-10-CM | POA: Diagnosis not present

## 2020-04-10 DIAGNOSIS — Z1159 Encounter for screening for other viral diseases: Secondary | ICD-10-CM | POA: Diagnosis not present

## 2020-04-10 DIAGNOSIS — Z20828 Contact with and (suspected) exposure to other viral communicable diseases: Secondary | ICD-10-CM | POA: Diagnosis not present

## 2020-04-10 DIAGNOSIS — M6281 Muscle weakness (generalized): Secondary | ICD-10-CM | POA: Diagnosis not present

## 2020-04-10 DIAGNOSIS — R41841 Cognitive communication deficit: Secondary | ICD-10-CM | POA: Diagnosis not present

## 2020-04-10 DIAGNOSIS — R488 Other symbolic dysfunctions: Secondary | ICD-10-CM | POA: Diagnosis not present

## 2020-04-10 DIAGNOSIS — R2681 Unsteadiness on feet: Secondary | ICD-10-CM | POA: Diagnosis not present

## 2020-04-13 DIAGNOSIS — R2681 Unsteadiness on feet: Secondary | ICD-10-CM | POA: Diagnosis not present

## 2020-04-13 DIAGNOSIS — R41841 Cognitive communication deficit: Secondary | ICD-10-CM | POA: Diagnosis not present

## 2020-04-13 DIAGNOSIS — R488 Other symbolic dysfunctions: Secondary | ICD-10-CM | POA: Diagnosis not present

## 2020-04-13 DIAGNOSIS — M6281 Muscle weakness (generalized): Secondary | ICD-10-CM | POA: Diagnosis not present

## 2020-04-13 DIAGNOSIS — R278 Other lack of coordination: Secondary | ICD-10-CM | POA: Diagnosis not present

## 2020-04-14 DIAGNOSIS — M6281 Muscle weakness (generalized): Secondary | ICD-10-CM | POA: Diagnosis not present

## 2020-04-14 DIAGNOSIS — R2681 Unsteadiness on feet: Secondary | ICD-10-CM | POA: Diagnosis not present

## 2020-04-14 DIAGNOSIS — Z20828 Contact with and (suspected) exposure to other viral communicable diseases: Secondary | ICD-10-CM | POA: Diagnosis not present

## 2020-04-14 DIAGNOSIS — Z1159 Encounter for screening for other viral diseases: Secondary | ICD-10-CM | POA: Diagnosis not present

## 2020-04-14 DIAGNOSIS — R278 Other lack of coordination: Secondary | ICD-10-CM | POA: Diagnosis not present

## 2020-04-14 DIAGNOSIS — R488 Other symbolic dysfunctions: Secondary | ICD-10-CM | POA: Diagnosis not present

## 2020-04-14 DIAGNOSIS — R41841 Cognitive communication deficit: Secondary | ICD-10-CM | POA: Diagnosis not present

## 2020-04-15 DIAGNOSIS — M6281 Muscle weakness (generalized): Secondary | ICD-10-CM | POA: Diagnosis not present

## 2020-04-15 DIAGNOSIS — R41841 Cognitive communication deficit: Secondary | ICD-10-CM | POA: Diagnosis not present

## 2020-04-15 DIAGNOSIS — R278 Other lack of coordination: Secondary | ICD-10-CM | POA: Diagnosis not present

## 2020-04-15 DIAGNOSIS — R488 Other symbolic dysfunctions: Secondary | ICD-10-CM | POA: Diagnosis not present

## 2020-04-15 DIAGNOSIS — R2681 Unsteadiness on feet: Secondary | ICD-10-CM | POA: Diagnosis not present

## 2020-04-16 DIAGNOSIS — M6281 Muscle weakness (generalized): Secondary | ICD-10-CM | POA: Diagnosis not present

## 2020-04-16 DIAGNOSIS — R41841 Cognitive communication deficit: Secondary | ICD-10-CM | POA: Diagnosis not present

## 2020-04-16 DIAGNOSIS — R2681 Unsteadiness on feet: Secondary | ICD-10-CM | POA: Diagnosis not present

## 2020-04-16 DIAGNOSIS — R488 Other symbolic dysfunctions: Secondary | ICD-10-CM | POA: Diagnosis not present

## 2020-04-16 DIAGNOSIS — R278 Other lack of coordination: Secondary | ICD-10-CM | POA: Diagnosis not present

## 2020-04-20 DIAGNOSIS — R2681 Unsteadiness on feet: Secondary | ICD-10-CM | POA: Diagnosis not present

## 2020-04-20 DIAGNOSIS — R488 Other symbolic dysfunctions: Secondary | ICD-10-CM | POA: Diagnosis not present

## 2020-04-20 DIAGNOSIS — M6281 Muscle weakness (generalized): Secondary | ICD-10-CM | POA: Diagnosis not present

## 2020-04-20 DIAGNOSIS — R278 Other lack of coordination: Secondary | ICD-10-CM | POA: Diagnosis not present

## 2020-04-20 DIAGNOSIS — R41841 Cognitive communication deficit: Secondary | ICD-10-CM | POA: Diagnosis not present

## 2020-04-21 DIAGNOSIS — M6281 Muscle weakness (generalized): Secondary | ICD-10-CM | POA: Diagnosis not present

## 2020-04-21 DIAGNOSIS — Z1159 Encounter for screening for other viral diseases: Secondary | ICD-10-CM | POA: Diagnosis not present

## 2020-04-21 DIAGNOSIS — R2681 Unsteadiness on feet: Secondary | ICD-10-CM | POA: Diagnosis not present

## 2020-04-21 DIAGNOSIS — R488 Other symbolic dysfunctions: Secondary | ICD-10-CM | POA: Diagnosis not present

## 2020-04-21 DIAGNOSIS — R278 Other lack of coordination: Secondary | ICD-10-CM | POA: Diagnosis not present

## 2020-04-21 DIAGNOSIS — R41841 Cognitive communication deficit: Secondary | ICD-10-CM | POA: Diagnosis not present

## 2020-04-21 DIAGNOSIS — Z20828 Contact with and (suspected) exposure to other viral communicable diseases: Secondary | ICD-10-CM | POA: Diagnosis not present

## 2020-04-22 DIAGNOSIS — R278 Other lack of coordination: Secondary | ICD-10-CM | POA: Diagnosis not present

## 2020-04-22 DIAGNOSIS — R488 Other symbolic dysfunctions: Secondary | ICD-10-CM | POA: Diagnosis not present

## 2020-04-22 DIAGNOSIS — R41841 Cognitive communication deficit: Secondary | ICD-10-CM | POA: Diagnosis not present

## 2020-04-22 DIAGNOSIS — M6281 Muscle weakness (generalized): Secondary | ICD-10-CM | POA: Diagnosis not present

## 2020-04-22 DIAGNOSIS — M19012 Primary osteoarthritis, left shoulder: Secondary | ICD-10-CM | POA: Diagnosis not present

## 2020-04-22 DIAGNOSIS — R2681 Unsteadiness on feet: Secondary | ICD-10-CM | POA: Diagnosis not present

## 2020-04-23 DIAGNOSIS — M6281 Muscle weakness (generalized): Secondary | ICD-10-CM | POA: Diagnosis not present

## 2020-04-23 DIAGNOSIS — R278 Other lack of coordination: Secondary | ICD-10-CM | POA: Diagnosis not present

## 2020-04-23 DIAGNOSIS — R2681 Unsteadiness on feet: Secondary | ICD-10-CM | POA: Diagnosis not present

## 2020-04-23 DIAGNOSIS — R488 Other symbolic dysfunctions: Secondary | ICD-10-CM | POA: Diagnosis not present

## 2020-04-23 DIAGNOSIS — R41841 Cognitive communication deficit: Secondary | ICD-10-CM | POA: Diagnosis not present

## 2020-04-24 DIAGNOSIS — R278 Other lack of coordination: Secondary | ICD-10-CM | POA: Diagnosis not present

## 2020-04-24 DIAGNOSIS — R488 Other symbolic dysfunctions: Secondary | ICD-10-CM | POA: Diagnosis not present

## 2020-04-24 DIAGNOSIS — M6281 Muscle weakness (generalized): Secondary | ICD-10-CM | POA: Diagnosis not present

## 2020-04-24 DIAGNOSIS — R41841 Cognitive communication deficit: Secondary | ICD-10-CM | POA: Diagnosis not present

## 2020-04-24 DIAGNOSIS — R2681 Unsteadiness on feet: Secondary | ICD-10-CM | POA: Diagnosis not present

## 2020-04-27 DIAGNOSIS — M6281 Muscle weakness (generalized): Secondary | ICD-10-CM | POA: Diagnosis not present

## 2020-04-27 DIAGNOSIS — R2681 Unsteadiness on feet: Secondary | ICD-10-CM | POA: Diagnosis not present

## 2020-04-28 DIAGNOSIS — M6281 Muscle weakness (generalized): Secondary | ICD-10-CM | POA: Diagnosis not present

## 2020-04-28 DIAGNOSIS — Z20828 Contact with and (suspected) exposure to other viral communicable diseases: Secondary | ICD-10-CM | POA: Diagnosis not present

## 2020-04-28 DIAGNOSIS — Z1159 Encounter for screening for other viral diseases: Secondary | ICD-10-CM | POA: Diagnosis not present

## 2020-04-28 DIAGNOSIS — R2681 Unsteadiness on feet: Secondary | ICD-10-CM | POA: Diagnosis not present

## 2020-05-01 DIAGNOSIS — R2681 Unsteadiness on feet: Secondary | ICD-10-CM | POA: Diagnosis not present

## 2020-05-01 DIAGNOSIS — M6281 Muscle weakness (generalized): Secondary | ICD-10-CM | POA: Diagnosis not present

## 2020-05-04 DIAGNOSIS — R2681 Unsteadiness on feet: Secondary | ICD-10-CM | POA: Diagnosis not present

## 2020-05-04 DIAGNOSIS — M6281 Muscle weakness (generalized): Secondary | ICD-10-CM | POA: Diagnosis not present

## 2020-05-05 DIAGNOSIS — R2681 Unsteadiness on feet: Secondary | ICD-10-CM | POA: Diagnosis not present

## 2020-05-05 DIAGNOSIS — M6281 Muscle weakness (generalized): Secondary | ICD-10-CM | POA: Diagnosis not present

## 2020-05-05 DIAGNOSIS — Z20828 Contact with and (suspected) exposure to other viral communicable diseases: Secondary | ICD-10-CM | POA: Diagnosis not present

## 2020-05-05 DIAGNOSIS — Z1159 Encounter for screening for other viral diseases: Secondary | ICD-10-CM | POA: Diagnosis not present

## 2020-05-07 DIAGNOSIS — R2681 Unsteadiness on feet: Secondary | ICD-10-CM | POA: Diagnosis not present

## 2020-05-07 DIAGNOSIS — M6281 Muscle weakness (generalized): Secondary | ICD-10-CM | POA: Diagnosis not present

## 2020-05-12 DIAGNOSIS — R2681 Unsteadiness on feet: Secondary | ICD-10-CM | POA: Diagnosis not present

## 2020-05-12 DIAGNOSIS — M6281 Muscle weakness (generalized): Secondary | ICD-10-CM | POA: Diagnosis not present

## 2020-05-13 DIAGNOSIS — M6281 Muscle weakness (generalized): Secondary | ICD-10-CM | POA: Diagnosis not present

## 2020-05-13 DIAGNOSIS — R2681 Unsteadiness on feet: Secondary | ICD-10-CM | POA: Diagnosis not present

## 2020-05-19 DIAGNOSIS — Z20828 Contact with and (suspected) exposure to other viral communicable diseases: Secondary | ICD-10-CM | POA: Diagnosis not present

## 2020-05-19 DIAGNOSIS — Z1159 Encounter for screening for other viral diseases: Secondary | ICD-10-CM | POA: Diagnosis not present

## 2020-06-05 DIAGNOSIS — Z20828 Contact with and (suspected) exposure to other viral communicable diseases: Secondary | ICD-10-CM | POA: Diagnosis not present

## 2020-06-05 DIAGNOSIS — Z1159 Encounter for screening for other viral diseases: Secondary | ICD-10-CM | POA: Diagnosis not present

## 2020-06-15 DIAGNOSIS — R278 Other lack of coordination: Secondary | ICD-10-CM | POA: Diagnosis not present

## 2020-06-15 DIAGNOSIS — R41841 Cognitive communication deficit: Secondary | ICD-10-CM | POA: Diagnosis not present

## 2020-06-15 DIAGNOSIS — R488 Other symbolic dysfunctions: Secondary | ICD-10-CM | POA: Diagnosis not present

## 2020-06-15 DIAGNOSIS — R262 Difficulty in walking, not elsewhere classified: Secondary | ICD-10-CM | POA: Diagnosis not present

## 2020-06-15 DIAGNOSIS — M6281 Muscle weakness (generalized): Secondary | ICD-10-CM | POA: Diagnosis not present

## 2020-06-16 DIAGNOSIS — M6281 Muscle weakness (generalized): Secondary | ICD-10-CM | POA: Diagnosis not present

## 2020-06-16 DIAGNOSIS — R262 Difficulty in walking, not elsewhere classified: Secondary | ICD-10-CM | POA: Diagnosis not present

## 2020-06-16 DIAGNOSIS — R488 Other symbolic dysfunctions: Secondary | ICD-10-CM | POA: Diagnosis not present

## 2020-06-16 DIAGNOSIS — R278 Other lack of coordination: Secondary | ICD-10-CM | POA: Diagnosis not present

## 2020-06-16 DIAGNOSIS — R41841 Cognitive communication deficit: Secondary | ICD-10-CM | POA: Diagnosis not present

## 2020-06-17 DIAGNOSIS — R41841 Cognitive communication deficit: Secondary | ICD-10-CM | POA: Diagnosis not present

## 2020-06-17 DIAGNOSIS — R278 Other lack of coordination: Secondary | ICD-10-CM | POA: Diagnosis not present

## 2020-06-17 DIAGNOSIS — R488 Other symbolic dysfunctions: Secondary | ICD-10-CM | POA: Diagnosis not present

## 2020-06-17 DIAGNOSIS — M6281 Muscle weakness (generalized): Secondary | ICD-10-CM | POA: Diagnosis not present

## 2020-06-17 DIAGNOSIS — R262 Difficulty in walking, not elsewhere classified: Secondary | ICD-10-CM | POA: Diagnosis not present

## 2020-06-18 DIAGNOSIS — R278 Other lack of coordination: Secondary | ICD-10-CM | POA: Diagnosis not present

## 2020-06-18 DIAGNOSIS — R41841 Cognitive communication deficit: Secondary | ICD-10-CM | POA: Diagnosis not present

## 2020-06-18 DIAGNOSIS — R262 Difficulty in walking, not elsewhere classified: Secondary | ICD-10-CM | POA: Diagnosis not present

## 2020-06-18 DIAGNOSIS — M6281 Muscle weakness (generalized): Secondary | ICD-10-CM | POA: Diagnosis not present

## 2020-06-18 DIAGNOSIS — R488 Other symbolic dysfunctions: Secondary | ICD-10-CM | POA: Diagnosis not present

## 2020-06-22 DIAGNOSIS — R278 Other lack of coordination: Secondary | ICD-10-CM | POA: Diagnosis not present

## 2020-06-22 DIAGNOSIS — R41841 Cognitive communication deficit: Secondary | ICD-10-CM | POA: Diagnosis not present

## 2020-06-22 DIAGNOSIS — M6281 Muscle weakness (generalized): Secondary | ICD-10-CM | POA: Diagnosis not present

## 2020-06-22 DIAGNOSIS — R488 Other symbolic dysfunctions: Secondary | ICD-10-CM | POA: Diagnosis not present

## 2020-06-22 DIAGNOSIS — R262 Difficulty in walking, not elsewhere classified: Secondary | ICD-10-CM | POA: Diagnosis not present

## 2020-06-23 DIAGNOSIS — R41841 Cognitive communication deficit: Secondary | ICD-10-CM | POA: Diagnosis not present

## 2020-06-23 DIAGNOSIS — M6281 Muscle weakness (generalized): Secondary | ICD-10-CM | POA: Diagnosis not present

## 2020-06-23 DIAGNOSIS — R488 Other symbolic dysfunctions: Secondary | ICD-10-CM | POA: Diagnosis not present

## 2020-06-23 DIAGNOSIS — R262 Difficulty in walking, not elsewhere classified: Secondary | ICD-10-CM | POA: Diagnosis not present

## 2020-06-23 DIAGNOSIS — R278 Other lack of coordination: Secondary | ICD-10-CM | POA: Diagnosis not present

## 2020-06-24 DIAGNOSIS — R262 Difficulty in walking, not elsewhere classified: Secondary | ICD-10-CM | POA: Diagnosis not present

## 2020-06-24 DIAGNOSIS — R41841 Cognitive communication deficit: Secondary | ICD-10-CM | POA: Diagnosis not present

## 2020-06-24 DIAGNOSIS — M6281 Muscle weakness (generalized): Secondary | ICD-10-CM | POA: Diagnosis not present

## 2020-06-24 DIAGNOSIS — R488 Other symbolic dysfunctions: Secondary | ICD-10-CM | POA: Diagnosis not present

## 2020-06-24 DIAGNOSIS — R278 Other lack of coordination: Secondary | ICD-10-CM | POA: Diagnosis not present

## 2020-06-25 DIAGNOSIS — R488 Other symbolic dysfunctions: Secondary | ICD-10-CM | POA: Diagnosis not present

## 2020-06-25 DIAGNOSIS — R278 Other lack of coordination: Secondary | ICD-10-CM | POA: Diagnosis not present

## 2020-06-25 DIAGNOSIS — R41841 Cognitive communication deficit: Secondary | ICD-10-CM | POA: Diagnosis not present

## 2020-06-25 DIAGNOSIS — M6281 Muscle weakness (generalized): Secondary | ICD-10-CM | POA: Diagnosis not present

## 2020-06-25 DIAGNOSIS — R262 Difficulty in walking, not elsewhere classified: Secondary | ICD-10-CM | POA: Diagnosis not present

## 2020-06-29 ENCOUNTER — Ambulatory Visit: Payer: Medicare Other | Admitting: Neurology

## 2020-06-29 DIAGNOSIS — R278 Other lack of coordination: Secondary | ICD-10-CM | POA: Diagnosis not present

## 2020-06-29 DIAGNOSIS — M6281 Muscle weakness (generalized): Secondary | ICD-10-CM | POA: Diagnosis not present

## 2020-06-29 DIAGNOSIS — R41841 Cognitive communication deficit: Secondary | ICD-10-CM | POA: Diagnosis not present

## 2020-06-29 DIAGNOSIS — R262 Difficulty in walking, not elsewhere classified: Secondary | ICD-10-CM | POA: Diagnosis not present

## 2020-06-29 DIAGNOSIS — R488 Other symbolic dysfunctions: Secondary | ICD-10-CM | POA: Diagnosis not present

## 2020-06-30 DIAGNOSIS — R488 Other symbolic dysfunctions: Secondary | ICD-10-CM | POA: Diagnosis not present

## 2020-06-30 DIAGNOSIS — R262 Difficulty in walking, not elsewhere classified: Secondary | ICD-10-CM | POA: Diagnosis not present

## 2020-06-30 DIAGNOSIS — R41841 Cognitive communication deficit: Secondary | ICD-10-CM | POA: Diagnosis not present

## 2020-06-30 DIAGNOSIS — R278 Other lack of coordination: Secondary | ICD-10-CM | POA: Diagnosis not present

## 2020-06-30 DIAGNOSIS — M6281 Muscle weakness (generalized): Secondary | ICD-10-CM | POA: Diagnosis not present

## 2020-07-01 DIAGNOSIS — M6281 Muscle weakness (generalized): Secondary | ICD-10-CM | POA: Diagnosis not present

## 2020-07-01 DIAGNOSIS — R278 Other lack of coordination: Secondary | ICD-10-CM | POA: Diagnosis not present

## 2020-07-01 DIAGNOSIS — R488 Other symbolic dysfunctions: Secondary | ICD-10-CM | POA: Diagnosis not present

## 2020-07-01 DIAGNOSIS — R262 Difficulty in walking, not elsewhere classified: Secondary | ICD-10-CM | POA: Diagnosis not present

## 2020-07-01 DIAGNOSIS — R41841 Cognitive communication deficit: Secondary | ICD-10-CM | POA: Diagnosis not present

## 2020-07-02 DIAGNOSIS — R278 Other lack of coordination: Secondary | ICD-10-CM | POA: Diagnosis not present

## 2020-07-02 DIAGNOSIS — R41841 Cognitive communication deficit: Secondary | ICD-10-CM | POA: Diagnosis not present

## 2020-07-02 DIAGNOSIS — R262 Difficulty in walking, not elsewhere classified: Secondary | ICD-10-CM | POA: Diagnosis not present

## 2020-07-02 DIAGNOSIS — R488 Other symbolic dysfunctions: Secondary | ICD-10-CM | POA: Diagnosis not present

## 2020-07-02 DIAGNOSIS — M6281 Muscle weakness (generalized): Secondary | ICD-10-CM | POA: Diagnosis not present

## 2020-07-03 DIAGNOSIS — R278 Other lack of coordination: Secondary | ICD-10-CM | POA: Diagnosis not present

## 2020-07-03 DIAGNOSIS — R41841 Cognitive communication deficit: Secondary | ICD-10-CM | POA: Diagnosis not present

## 2020-07-03 DIAGNOSIS — M6281 Muscle weakness (generalized): Secondary | ICD-10-CM | POA: Diagnosis not present

## 2020-07-03 DIAGNOSIS — R488 Other symbolic dysfunctions: Secondary | ICD-10-CM | POA: Diagnosis not present

## 2020-07-03 DIAGNOSIS — R262 Difficulty in walking, not elsewhere classified: Secondary | ICD-10-CM | POA: Diagnosis not present

## 2020-07-06 DIAGNOSIS — R488 Other symbolic dysfunctions: Secondary | ICD-10-CM | POA: Diagnosis not present

## 2020-07-06 DIAGNOSIS — M6281 Muscle weakness (generalized): Secondary | ICD-10-CM | POA: Diagnosis not present

## 2020-07-06 DIAGNOSIS — R262 Difficulty in walking, not elsewhere classified: Secondary | ICD-10-CM | POA: Diagnosis not present

## 2020-07-06 DIAGNOSIS — R278 Other lack of coordination: Secondary | ICD-10-CM | POA: Diagnosis not present

## 2020-07-06 DIAGNOSIS — R41841 Cognitive communication deficit: Secondary | ICD-10-CM | POA: Diagnosis not present

## 2020-07-07 DIAGNOSIS — Z20828 Contact with and (suspected) exposure to other viral communicable diseases: Secondary | ICD-10-CM | POA: Diagnosis not present

## 2020-07-07 DIAGNOSIS — R278 Other lack of coordination: Secondary | ICD-10-CM | POA: Diagnosis not present

## 2020-07-07 DIAGNOSIS — Z1159 Encounter for screening for other viral diseases: Secondary | ICD-10-CM | POA: Diagnosis not present

## 2020-07-07 DIAGNOSIS — R41841 Cognitive communication deficit: Secondary | ICD-10-CM | POA: Diagnosis not present

## 2020-07-07 DIAGNOSIS — R262 Difficulty in walking, not elsewhere classified: Secondary | ICD-10-CM | POA: Diagnosis not present

## 2020-07-07 DIAGNOSIS — R488 Other symbolic dysfunctions: Secondary | ICD-10-CM | POA: Diagnosis not present

## 2020-07-07 DIAGNOSIS — M6281 Muscle weakness (generalized): Secondary | ICD-10-CM | POA: Diagnosis not present

## 2020-07-08 DIAGNOSIS — R41841 Cognitive communication deficit: Secondary | ICD-10-CM | POA: Diagnosis not present

## 2020-07-08 DIAGNOSIS — R262 Difficulty in walking, not elsewhere classified: Secondary | ICD-10-CM | POA: Diagnosis not present

## 2020-07-08 DIAGNOSIS — R278 Other lack of coordination: Secondary | ICD-10-CM | POA: Diagnosis not present

## 2020-07-08 DIAGNOSIS — M6281 Muscle weakness (generalized): Secondary | ICD-10-CM | POA: Diagnosis not present

## 2020-07-08 DIAGNOSIS — R488 Other symbolic dysfunctions: Secondary | ICD-10-CM | POA: Diagnosis not present

## 2020-07-09 DIAGNOSIS — R262 Difficulty in walking, not elsewhere classified: Secondary | ICD-10-CM | POA: Diagnosis not present

## 2020-07-09 DIAGNOSIS — R278 Other lack of coordination: Secondary | ICD-10-CM | POA: Diagnosis not present

## 2020-07-09 DIAGNOSIS — R488 Other symbolic dysfunctions: Secondary | ICD-10-CM | POA: Diagnosis not present

## 2020-07-09 DIAGNOSIS — R41841 Cognitive communication deficit: Secondary | ICD-10-CM | POA: Diagnosis not present

## 2020-07-09 DIAGNOSIS — M6281 Muscle weakness (generalized): Secondary | ICD-10-CM | POA: Diagnosis not present

## 2020-07-10 DIAGNOSIS — R278 Other lack of coordination: Secondary | ICD-10-CM | POA: Diagnosis not present

## 2020-07-10 DIAGNOSIS — R41841 Cognitive communication deficit: Secondary | ICD-10-CM | POA: Diagnosis not present

## 2020-07-10 DIAGNOSIS — R262 Difficulty in walking, not elsewhere classified: Secondary | ICD-10-CM | POA: Diagnosis not present

## 2020-07-10 DIAGNOSIS — Z1159 Encounter for screening for other viral diseases: Secondary | ICD-10-CM | POA: Diagnosis not present

## 2020-07-10 DIAGNOSIS — Z20828 Contact with and (suspected) exposure to other viral communicable diseases: Secondary | ICD-10-CM | POA: Diagnosis not present

## 2020-07-10 DIAGNOSIS — R488 Other symbolic dysfunctions: Secondary | ICD-10-CM | POA: Diagnosis not present

## 2020-07-10 DIAGNOSIS — M6281 Muscle weakness (generalized): Secondary | ICD-10-CM | POA: Diagnosis not present

## 2020-07-14 DIAGNOSIS — R41841 Cognitive communication deficit: Secondary | ICD-10-CM | POA: Diagnosis not present

## 2020-07-14 DIAGNOSIS — R488 Other symbolic dysfunctions: Secondary | ICD-10-CM | POA: Diagnosis not present

## 2020-07-14 DIAGNOSIS — R262 Difficulty in walking, not elsewhere classified: Secondary | ICD-10-CM | POA: Diagnosis not present

## 2020-07-14 DIAGNOSIS — R278 Other lack of coordination: Secondary | ICD-10-CM | POA: Diagnosis not present

## 2020-07-14 DIAGNOSIS — M6281 Muscle weakness (generalized): Secondary | ICD-10-CM | POA: Diagnosis not present

## 2020-07-15 DIAGNOSIS — R488 Other symbolic dysfunctions: Secondary | ICD-10-CM | POA: Diagnosis not present

## 2020-07-15 DIAGNOSIS — R278 Other lack of coordination: Secondary | ICD-10-CM | POA: Diagnosis not present

## 2020-07-15 DIAGNOSIS — M6281 Muscle weakness (generalized): Secondary | ICD-10-CM | POA: Diagnosis not present

## 2020-07-15 DIAGNOSIS — R262 Difficulty in walking, not elsewhere classified: Secondary | ICD-10-CM | POA: Diagnosis not present

## 2020-07-15 DIAGNOSIS — R41841 Cognitive communication deficit: Secondary | ICD-10-CM | POA: Diagnosis not present

## 2020-07-16 DIAGNOSIS — R41841 Cognitive communication deficit: Secondary | ICD-10-CM | POA: Diagnosis not present

## 2020-07-16 DIAGNOSIS — M6281 Muscle weakness (generalized): Secondary | ICD-10-CM | POA: Diagnosis not present

## 2020-07-16 DIAGNOSIS — R262 Difficulty in walking, not elsewhere classified: Secondary | ICD-10-CM | POA: Diagnosis not present

## 2020-07-16 DIAGNOSIS — R488 Other symbolic dysfunctions: Secondary | ICD-10-CM | POA: Diagnosis not present

## 2020-07-16 DIAGNOSIS — R278 Other lack of coordination: Secondary | ICD-10-CM | POA: Diagnosis not present

## 2020-07-17 DIAGNOSIS — M6281 Muscle weakness (generalized): Secondary | ICD-10-CM | POA: Diagnosis not present

## 2020-07-17 DIAGNOSIS — R41841 Cognitive communication deficit: Secondary | ICD-10-CM | POA: Diagnosis not present

## 2020-07-17 DIAGNOSIS — R262 Difficulty in walking, not elsewhere classified: Secondary | ICD-10-CM | POA: Diagnosis not present

## 2020-07-17 DIAGNOSIS — R488 Other symbolic dysfunctions: Secondary | ICD-10-CM | POA: Diagnosis not present

## 2020-07-17 DIAGNOSIS — R278 Other lack of coordination: Secondary | ICD-10-CM | POA: Diagnosis not present

## 2020-07-21 DIAGNOSIS — R488 Other symbolic dysfunctions: Secondary | ICD-10-CM | POA: Diagnosis not present

## 2020-07-21 DIAGNOSIS — M6281 Muscle weakness (generalized): Secondary | ICD-10-CM | POA: Diagnosis not present

## 2020-07-21 DIAGNOSIS — R41841 Cognitive communication deficit: Secondary | ICD-10-CM | POA: Diagnosis not present

## 2020-07-21 DIAGNOSIS — R278 Other lack of coordination: Secondary | ICD-10-CM | POA: Diagnosis not present

## 2020-07-21 DIAGNOSIS — R262 Difficulty in walking, not elsewhere classified: Secondary | ICD-10-CM | POA: Diagnosis not present

## 2020-07-22 DIAGNOSIS — R41841 Cognitive communication deficit: Secondary | ICD-10-CM | POA: Diagnosis not present

## 2020-07-22 DIAGNOSIS — R262 Difficulty in walking, not elsewhere classified: Secondary | ICD-10-CM | POA: Diagnosis not present

## 2020-07-22 DIAGNOSIS — M6281 Muscle weakness (generalized): Secondary | ICD-10-CM | POA: Diagnosis not present

## 2020-07-22 DIAGNOSIS — R278 Other lack of coordination: Secondary | ICD-10-CM | POA: Diagnosis not present

## 2020-07-22 DIAGNOSIS — R488 Other symbolic dysfunctions: Secondary | ICD-10-CM | POA: Diagnosis not present

## 2020-07-23 DIAGNOSIS — M6281 Muscle weakness (generalized): Secondary | ICD-10-CM | POA: Diagnosis not present

## 2020-07-23 DIAGNOSIS — R278 Other lack of coordination: Secondary | ICD-10-CM | POA: Diagnosis not present

## 2020-07-23 DIAGNOSIS — R41841 Cognitive communication deficit: Secondary | ICD-10-CM | POA: Diagnosis not present

## 2020-07-23 DIAGNOSIS — R262 Difficulty in walking, not elsewhere classified: Secondary | ICD-10-CM | POA: Diagnosis not present

## 2020-07-23 DIAGNOSIS — R488 Other symbolic dysfunctions: Secondary | ICD-10-CM | POA: Diagnosis not present

## 2020-07-23 DIAGNOSIS — I5033 Acute on chronic diastolic (congestive) heart failure: Secondary | ICD-10-CM | POA: Diagnosis not present

## 2020-07-24 DIAGNOSIS — R262 Difficulty in walking, not elsewhere classified: Secondary | ICD-10-CM | POA: Diagnosis not present

## 2020-07-24 DIAGNOSIS — M6281 Muscle weakness (generalized): Secondary | ICD-10-CM | POA: Diagnosis not present

## 2020-07-24 DIAGNOSIS — R488 Other symbolic dysfunctions: Secondary | ICD-10-CM | POA: Diagnosis not present

## 2020-07-25 DIAGNOSIS — R488 Other symbolic dysfunctions: Secondary | ICD-10-CM | POA: Diagnosis not present

## 2020-07-25 DIAGNOSIS — M6281 Muscle weakness (generalized): Secondary | ICD-10-CM | POA: Diagnosis not present

## 2020-07-25 DIAGNOSIS — R262 Difficulty in walking, not elsewhere classified: Secondary | ICD-10-CM | POA: Diagnosis not present

## 2020-07-29 DIAGNOSIS — R262 Difficulty in walking, not elsewhere classified: Secondary | ICD-10-CM | POA: Diagnosis not present

## 2020-07-29 DIAGNOSIS — M6281 Muscle weakness (generalized): Secondary | ICD-10-CM | POA: Diagnosis not present

## 2020-07-29 DIAGNOSIS — I10 Essential (primary) hypertension: Secondary | ICD-10-CM | POA: Diagnosis not present

## 2020-07-29 DIAGNOSIS — I5033 Acute on chronic diastolic (congestive) heart failure: Secondary | ICD-10-CM | POA: Diagnosis not present

## 2020-07-29 DIAGNOSIS — R488 Other symbolic dysfunctions: Secondary | ICD-10-CM | POA: Diagnosis not present

## 2020-09-03 DIAGNOSIS — Z20828 Contact with and (suspected) exposure to other viral communicable diseases: Secondary | ICD-10-CM | POA: Diagnosis not present

## 2020-09-07 DIAGNOSIS — Z20828 Contact with and (suspected) exposure to other viral communicable diseases: Secondary | ICD-10-CM | POA: Diagnosis not present

## 2020-09-14 DIAGNOSIS — Z20828 Contact with and (suspected) exposure to other viral communicable diseases: Secondary | ICD-10-CM | POA: Diagnosis not present

## 2020-09-18 ENCOUNTER — Ambulatory Visit: Payer: Medicare Other | Admitting: Neurology

## 2020-10-05 DIAGNOSIS — Z20828 Contact with and (suspected) exposure to other viral communicable diseases: Secondary | ICD-10-CM | POA: Diagnosis not present

## 2020-10-12 DIAGNOSIS — Z20828 Contact with and (suspected) exposure to other viral communicable diseases: Secondary | ICD-10-CM | POA: Diagnosis not present

## 2020-10-19 DIAGNOSIS — Z20828 Contact with and (suspected) exposure to other viral communicable diseases: Secondary | ICD-10-CM | POA: Diagnosis not present

## 2020-11-02 DIAGNOSIS — Z20828 Contact with and (suspected) exposure to other viral communicable diseases: Secondary | ICD-10-CM | POA: Diagnosis not present

## 2020-11-09 DIAGNOSIS — Z20828 Contact with and (suspected) exposure to other viral communicable diseases: Secondary | ICD-10-CM | POA: Diagnosis not present

## 2020-11-12 DIAGNOSIS — M6281 Muscle weakness (generalized): Secondary | ICD-10-CM | POA: Diagnosis not present

## 2020-11-13 DIAGNOSIS — M6281 Muscle weakness (generalized): Secondary | ICD-10-CM | POA: Diagnosis not present

## 2020-11-16 DIAGNOSIS — R488 Other symbolic dysfunctions: Secondary | ICD-10-CM | POA: Diagnosis not present

## 2020-11-16 DIAGNOSIS — Z20828 Contact with and (suspected) exposure to other viral communicable diseases: Secondary | ICD-10-CM | POA: Diagnosis not present

## 2020-11-16 DIAGNOSIS — R262 Difficulty in walking, not elsewhere classified: Secondary | ICD-10-CM | POA: Diagnosis not present

## 2020-11-16 DIAGNOSIS — R41841 Cognitive communication deficit: Secondary | ICD-10-CM | POA: Diagnosis not present

## 2020-11-16 DIAGNOSIS — R2681 Unsteadiness on feet: Secondary | ICD-10-CM | POA: Diagnosis not present

## 2020-11-16 DIAGNOSIS — M6281 Muscle weakness (generalized): Secondary | ICD-10-CM | POA: Diagnosis not present

## 2020-11-17 DIAGNOSIS — R41841 Cognitive communication deficit: Secondary | ICD-10-CM | POA: Diagnosis not present

## 2020-11-17 DIAGNOSIS — M6281 Muscle weakness (generalized): Secondary | ICD-10-CM | POA: Diagnosis not present

## 2020-11-17 DIAGNOSIS — R2681 Unsteadiness on feet: Secondary | ICD-10-CM | POA: Diagnosis not present

## 2020-11-17 DIAGNOSIS — R488 Other symbolic dysfunctions: Secondary | ICD-10-CM | POA: Diagnosis not present

## 2020-11-17 DIAGNOSIS — R262 Difficulty in walking, not elsewhere classified: Secondary | ICD-10-CM | POA: Diagnosis not present

## 2020-11-18 DIAGNOSIS — R41841 Cognitive communication deficit: Secondary | ICD-10-CM | POA: Diagnosis not present

## 2020-11-18 DIAGNOSIS — R488 Other symbolic dysfunctions: Secondary | ICD-10-CM | POA: Diagnosis not present

## 2020-11-18 DIAGNOSIS — R262 Difficulty in walking, not elsewhere classified: Secondary | ICD-10-CM | POA: Diagnosis not present

## 2020-11-18 DIAGNOSIS — M6281 Muscle weakness (generalized): Secondary | ICD-10-CM | POA: Diagnosis not present

## 2020-11-18 DIAGNOSIS — R2681 Unsteadiness on feet: Secondary | ICD-10-CM | POA: Diagnosis not present

## 2020-11-19 DIAGNOSIS — R262 Difficulty in walking, not elsewhere classified: Secondary | ICD-10-CM | POA: Diagnosis not present

## 2020-11-19 DIAGNOSIS — R2681 Unsteadiness on feet: Secondary | ICD-10-CM | POA: Diagnosis not present

## 2020-11-19 DIAGNOSIS — M6281 Muscle weakness (generalized): Secondary | ICD-10-CM | POA: Diagnosis not present

## 2020-11-19 DIAGNOSIS — R488 Other symbolic dysfunctions: Secondary | ICD-10-CM | POA: Diagnosis not present

## 2020-11-19 DIAGNOSIS — R41841 Cognitive communication deficit: Secondary | ICD-10-CM | POA: Diagnosis not present

## 2020-11-23 DIAGNOSIS — M6281 Muscle weakness (generalized): Secondary | ICD-10-CM | POA: Diagnosis not present

## 2020-11-23 DIAGNOSIS — Z20828 Contact with and (suspected) exposure to other viral communicable diseases: Secondary | ICD-10-CM | POA: Diagnosis not present

## 2020-11-23 DIAGNOSIS — R262 Difficulty in walking, not elsewhere classified: Secondary | ICD-10-CM | POA: Diagnosis not present

## 2020-11-23 DIAGNOSIS — R488 Other symbolic dysfunctions: Secondary | ICD-10-CM | POA: Diagnosis not present

## 2020-11-23 DIAGNOSIS — R2681 Unsteadiness on feet: Secondary | ICD-10-CM | POA: Diagnosis not present

## 2020-11-23 DIAGNOSIS — R41841 Cognitive communication deficit: Secondary | ICD-10-CM | POA: Diagnosis not present

## 2020-11-24 DIAGNOSIS — M6281 Muscle weakness (generalized): Secondary | ICD-10-CM | POA: Diagnosis not present

## 2020-11-24 DIAGNOSIS — R262 Difficulty in walking, not elsewhere classified: Secondary | ICD-10-CM | POA: Diagnosis not present

## 2020-11-24 DIAGNOSIS — R488 Other symbolic dysfunctions: Secondary | ICD-10-CM | POA: Diagnosis not present

## 2020-11-24 DIAGNOSIS — R2681 Unsteadiness on feet: Secondary | ICD-10-CM | POA: Diagnosis not present

## 2020-11-24 DIAGNOSIS — R41841 Cognitive communication deficit: Secondary | ICD-10-CM | POA: Diagnosis not present

## 2020-11-25 DIAGNOSIS — R488 Other symbolic dysfunctions: Secondary | ICD-10-CM | POA: Diagnosis not present

## 2020-11-25 DIAGNOSIS — R41841 Cognitive communication deficit: Secondary | ICD-10-CM | POA: Diagnosis not present

## 2020-11-25 DIAGNOSIS — R2681 Unsteadiness on feet: Secondary | ICD-10-CM | POA: Diagnosis not present

## 2020-11-25 DIAGNOSIS — R262 Difficulty in walking, not elsewhere classified: Secondary | ICD-10-CM | POA: Diagnosis not present

## 2020-11-25 DIAGNOSIS — M6281 Muscle weakness (generalized): Secondary | ICD-10-CM | POA: Diagnosis not present

## 2020-11-26 DIAGNOSIS — R488 Other symbolic dysfunctions: Secondary | ICD-10-CM | POA: Diagnosis not present

## 2020-11-26 DIAGNOSIS — R262 Difficulty in walking, not elsewhere classified: Secondary | ICD-10-CM | POA: Diagnosis not present

## 2020-11-26 DIAGNOSIS — R2681 Unsteadiness on feet: Secondary | ICD-10-CM | POA: Diagnosis not present

## 2020-11-26 DIAGNOSIS — M6281 Muscle weakness (generalized): Secondary | ICD-10-CM | POA: Diagnosis not present

## 2020-11-26 DIAGNOSIS — R41841 Cognitive communication deficit: Secondary | ICD-10-CM | POA: Diagnosis not present

## 2020-11-27 DIAGNOSIS — R262 Difficulty in walking, not elsewhere classified: Secondary | ICD-10-CM | POA: Diagnosis not present

## 2020-11-27 DIAGNOSIS — M6281 Muscle weakness (generalized): Secondary | ICD-10-CM | POA: Diagnosis not present

## 2020-11-27 DIAGNOSIS — R41841 Cognitive communication deficit: Secondary | ICD-10-CM | POA: Diagnosis not present

## 2020-11-27 DIAGNOSIS — R2681 Unsteadiness on feet: Secondary | ICD-10-CM | POA: Diagnosis not present

## 2020-11-27 DIAGNOSIS — R488 Other symbolic dysfunctions: Secondary | ICD-10-CM | POA: Diagnosis not present

## 2020-11-30 DIAGNOSIS — Z20828 Contact with and (suspected) exposure to other viral communicable diseases: Secondary | ICD-10-CM | POA: Diagnosis not present

## 2020-12-01 DIAGNOSIS — R2681 Unsteadiness on feet: Secondary | ICD-10-CM | POA: Diagnosis not present

## 2020-12-01 DIAGNOSIS — M6281 Muscle weakness (generalized): Secondary | ICD-10-CM | POA: Diagnosis not present

## 2020-12-01 DIAGNOSIS — R41841 Cognitive communication deficit: Secondary | ICD-10-CM | POA: Diagnosis not present

## 2020-12-01 DIAGNOSIS — R488 Other symbolic dysfunctions: Secondary | ICD-10-CM | POA: Diagnosis not present

## 2020-12-01 DIAGNOSIS — R262 Difficulty in walking, not elsewhere classified: Secondary | ICD-10-CM | POA: Diagnosis not present

## 2020-12-02 DIAGNOSIS — R41841 Cognitive communication deficit: Secondary | ICD-10-CM | POA: Diagnosis not present

## 2020-12-02 DIAGNOSIS — R262 Difficulty in walking, not elsewhere classified: Secondary | ICD-10-CM | POA: Diagnosis not present

## 2020-12-02 DIAGNOSIS — R2681 Unsteadiness on feet: Secondary | ICD-10-CM | POA: Diagnosis not present

## 2020-12-02 DIAGNOSIS — M6281 Muscle weakness (generalized): Secondary | ICD-10-CM | POA: Diagnosis not present

## 2020-12-02 DIAGNOSIS — R488 Other symbolic dysfunctions: Secondary | ICD-10-CM | POA: Diagnosis not present

## 2020-12-03 DIAGNOSIS — M6281 Muscle weakness (generalized): Secondary | ICD-10-CM | POA: Diagnosis not present

## 2020-12-03 DIAGNOSIS — R2681 Unsteadiness on feet: Secondary | ICD-10-CM | POA: Diagnosis not present

## 2020-12-03 DIAGNOSIS — R262 Difficulty in walking, not elsewhere classified: Secondary | ICD-10-CM | POA: Diagnosis not present

## 2020-12-03 DIAGNOSIS — R488 Other symbolic dysfunctions: Secondary | ICD-10-CM | POA: Diagnosis not present

## 2020-12-03 DIAGNOSIS — R41841 Cognitive communication deficit: Secondary | ICD-10-CM | POA: Diagnosis not present

## 2020-12-04 DIAGNOSIS — R2681 Unsteadiness on feet: Secondary | ICD-10-CM | POA: Diagnosis not present

## 2020-12-04 DIAGNOSIS — R488 Other symbolic dysfunctions: Secondary | ICD-10-CM | POA: Diagnosis not present

## 2020-12-04 DIAGNOSIS — R41841 Cognitive communication deficit: Secondary | ICD-10-CM | POA: Diagnosis not present

## 2020-12-04 DIAGNOSIS — M6281 Muscle weakness (generalized): Secondary | ICD-10-CM | POA: Diagnosis not present

## 2020-12-04 DIAGNOSIS — R262 Difficulty in walking, not elsewhere classified: Secondary | ICD-10-CM | POA: Diagnosis not present

## 2020-12-07 DIAGNOSIS — R262 Difficulty in walking, not elsewhere classified: Secondary | ICD-10-CM | POA: Diagnosis not present

## 2020-12-07 DIAGNOSIS — R2681 Unsteadiness on feet: Secondary | ICD-10-CM | POA: Diagnosis not present

## 2020-12-07 DIAGNOSIS — Z20828 Contact with and (suspected) exposure to other viral communicable diseases: Secondary | ICD-10-CM | POA: Diagnosis not present

## 2020-12-07 DIAGNOSIS — R41841 Cognitive communication deficit: Secondary | ICD-10-CM | POA: Diagnosis not present

## 2020-12-07 DIAGNOSIS — M6281 Muscle weakness (generalized): Secondary | ICD-10-CM | POA: Diagnosis not present

## 2020-12-07 DIAGNOSIS — R488 Other symbolic dysfunctions: Secondary | ICD-10-CM | POA: Diagnosis not present

## 2020-12-08 DIAGNOSIS — R262 Difficulty in walking, not elsewhere classified: Secondary | ICD-10-CM | POA: Diagnosis not present

## 2020-12-08 DIAGNOSIS — R41841 Cognitive communication deficit: Secondary | ICD-10-CM | POA: Diagnosis not present

## 2020-12-08 DIAGNOSIS — R488 Other symbolic dysfunctions: Secondary | ICD-10-CM | POA: Diagnosis not present

## 2020-12-08 DIAGNOSIS — R2681 Unsteadiness on feet: Secondary | ICD-10-CM | POA: Diagnosis not present

## 2020-12-08 DIAGNOSIS — M6281 Muscle weakness (generalized): Secondary | ICD-10-CM | POA: Diagnosis not present

## 2020-12-09 DIAGNOSIS — R41841 Cognitive communication deficit: Secondary | ICD-10-CM | POA: Diagnosis not present

## 2020-12-09 DIAGNOSIS — R262 Difficulty in walking, not elsewhere classified: Secondary | ICD-10-CM | POA: Diagnosis not present

## 2020-12-09 DIAGNOSIS — R488 Other symbolic dysfunctions: Secondary | ICD-10-CM | POA: Diagnosis not present

## 2020-12-09 DIAGNOSIS — R2681 Unsteadiness on feet: Secondary | ICD-10-CM | POA: Diagnosis not present

## 2020-12-09 DIAGNOSIS — M6281 Muscle weakness (generalized): Secondary | ICD-10-CM | POA: Diagnosis not present

## 2020-12-10 DIAGNOSIS — Z20828 Contact with and (suspected) exposure to other viral communicable diseases: Secondary | ICD-10-CM | POA: Diagnosis not present

## 2020-12-11 DIAGNOSIS — R488 Other symbolic dysfunctions: Secondary | ICD-10-CM | POA: Diagnosis not present

## 2020-12-11 DIAGNOSIS — R2681 Unsteadiness on feet: Secondary | ICD-10-CM | POA: Diagnosis not present

## 2020-12-11 DIAGNOSIS — R262 Difficulty in walking, not elsewhere classified: Secondary | ICD-10-CM | POA: Diagnosis not present

## 2020-12-11 DIAGNOSIS — M6281 Muscle weakness (generalized): Secondary | ICD-10-CM | POA: Diagnosis not present

## 2020-12-11 DIAGNOSIS — R41841 Cognitive communication deficit: Secondary | ICD-10-CM | POA: Diagnosis not present

## 2020-12-14 DIAGNOSIS — M6281 Muscle weakness (generalized): Secondary | ICD-10-CM | POA: Diagnosis not present

## 2020-12-14 DIAGNOSIS — R41841 Cognitive communication deficit: Secondary | ICD-10-CM | POA: Diagnosis not present

## 2020-12-14 DIAGNOSIS — R2681 Unsteadiness on feet: Secondary | ICD-10-CM | POA: Diagnosis not present

## 2020-12-14 DIAGNOSIS — R488 Other symbolic dysfunctions: Secondary | ICD-10-CM | POA: Diagnosis not present

## 2020-12-14 DIAGNOSIS — Z20828 Contact with and (suspected) exposure to other viral communicable diseases: Secondary | ICD-10-CM | POA: Diagnosis not present

## 2020-12-14 DIAGNOSIS — R262 Difficulty in walking, not elsewhere classified: Secondary | ICD-10-CM | POA: Diagnosis not present

## 2020-12-15 DIAGNOSIS — R488 Other symbolic dysfunctions: Secondary | ICD-10-CM | POA: Diagnosis not present

## 2020-12-15 DIAGNOSIS — R2681 Unsteadiness on feet: Secondary | ICD-10-CM | POA: Diagnosis not present

## 2020-12-15 DIAGNOSIS — M6281 Muscle weakness (generalized): Secondary | ICD-10-CM | POA: Diagnosis not present

## 2020-12-15 DIAGNOSIS — R41841 Cognitive communication deficit: Secondary | ICD-10-CM | POA: Diagnosis not present

## 2020-12-15 DIAGNOSIS — R262 Difficulty in walking, not elsewhere classified: Secondary | ICD-10-CM | POA: Diagnosis not present

## 2020-12-16 DIAGNOSIS — R488 Other symbolic dysfunctions: Secondary | ICD-10-CM | POA: Diagnosis not present

## 2020-12-16 DIAGNOSIS — R262 Difficulty in walking, not elsewhere classified: Secondary | ICD-10-CM | POA: Diagnosis not present

## 2020-12-16 DIAGNOSIS — R2681 Unsteadiness on feet: Secondary | ICD-10-CM | POA: Diagnosis not present

## 2020-12-16 DIAGNOSIS — R41841 Cognitive communication deficit: Secondary | ICD-10-CM | POA: Diagnosis not present

## 2020-12-16 DIAGNOSIS — M6281 Muscle weakness (generalized): Secondary | ICD-10-CM | POA: Diagnosis not present

## 2020-12-17 DIAGNOSIS — R488 Other symbolic dysfunctions: Secondary | ICD-10-CM | POA: Diagnosis not present

## 2020-12-17 DIAGNOSIS — R41841 Cognitive communication deficit: Secondary | ICD-10-CM | POA: Diagnosis not present

## 2020-12-17 DIAGNOSIS — R262 Difficulty in walking, not elsewhere classified: Secondary | ICD-10-CM | POA: Diagnosis not present

## 2020-12-17 DIAGNOSIS — Z20828 Contact with and (suspected) exposure to other viral communicable diseases: Secondary | ICD-10-CM | POA: Diagnosis not present

## 2020-12-17 DIAGNOSIS — R2681 Unsteadiness on feet: Secondary | ICD-10-CM | POA: Diagnosis not present

## 2020-12-17 DIAGNOSIS — M6281 Muscle weakness (generalized): Secondary | ICD-10-CM | POA: Diagnosis not present

## 2020-12-18 DIAGNOSIS — R2681 Unsteadiness on feet: Secondary | ICD-10-CM | POA: Diagnosis not present

## 2020-12-18 DIAGNOSIS — R41841 Cognitive communication deficit: Secondary | ICD-10-CM | POA: Diagnosis not present

## 2020-12-18 DIAGNOSIS — R262 Difficulty in walking, not elsewhere classified: Secondary | ICD-10-CM | POA: Diagnosis not present

## 2020-12-18 DIAGNOSIS — R488 Other symbolic dysfunctions: Secondary | ICD-10-CM | POA: Diagnosis not present

## 2020-12-18 DIAGNOSIS — M6281 Muscle weakness (generalized): Secondary | ICD-10-CM | POA: Diagnosis not present

## 2020-12-19 DIAGNOSIS — R488 Other symbolic dysfunctions: Secondary | ICD-10-CM | POA: Diagnosis not present

## 2020-12-19 DIAGNOSIS — R41841 Cognitive communication deficit: Secondary | ICD-10-CM | POA: Diagnosis not present

## 2020-12-19 DIAGNOSIS — R2681 Unsteadiness on feet: Secondary | ICD-10-CM | POA: Diagnosis not present

## 2020-12-19 DIAGNOSIS — R262 Difficulty in walking, not elsewhere classified: Secondary | ICD-10-CM | POA: Diagnosis not present

## 2020-12-19 DIAGNOSIS — M6281 Muscle weakness (generalized): Secondary | ICD-10-CM | POA: Diagnosis not present

## 2020-12-21 DIAGNOSIS — R41841 Cognitive communication deficit: Secondary | ICD-10-CM | POA: Diagnosis not present

## 2020-12-21 DIAGNOSIS — M6281 Muscle weakness (generalized): Secondary | ICD-10-CM | POA: Diagnosis not present

## 2020-12-21 DIAGNOSIS — R262 Difficulty in walking, not elsewhere classified: Secondary | ICD-10-CM | POA: Diagnosis not present

## 2020-12-21 DIAGNOSIS — Z20828 Contact with and (suspected) exposure to other viral communicable diseases: Secondary | ICD-10-CM | POA: Diagnosis not present

## 2020-12-21 DIAGNOSIS — R2681 Unsteadiness on feet: Secondary | ICD-10-CM | POA: Diagnosis not present

## 2020-12-21 DIAGNOSIS — R488 Other symbolic dysfunctions: Secondary | ICD-10-CM | POA: Diagnosis not present

## 2020-12-22 DIAGNOSIS — R262 Difficulty in walking, not elsewhere classified: Secondary | ICD-10-CM | POA: Diagnosis not present

## 2020-12-22 DIAGNOSIS — R41841 Cognitive communication deficit: Secondary | ICD-10-CM | POA: Diagnosis not present

## 2020-12-22 DIAGNOSIS — R2681 Unsteadiness on feet: Secondary | ICD-10-CM | POA: Diagnosis not present

## 2020-12-22 DIAGNOSIS — R488 Other symbolic dysfunctions: Secondary | ICD-10-CM | POA: Diagnosis not present

## 2020-12-22 DIAGNOSIS — M6281 Muscle weakness (generalized): Secondary | ICD-10-CM | POA: Diagnosis not present

## 2020-12-23 DIAGNOSIS — M6281 Muscle weakness (generalized): Secondary | ICD-10-CM | POA: Diagnosis not present

## 2020-12-23 DIAGNOSIS — R2681 Unsteadiness on feet: Secondary | ICD-10-CM | POA: Diagnosis not present

## 2020-12-23 DIAGNOSIS — R41841 Cognitive communication deficit: Secondary | ICD-10-CM | POA: Diagnosis not present

## 2020-12-23 DIAGNOSIS — R488 Other symbolic dysfunctions: Secondary | ICD-10-CM | POA: Diagnosis not present

## 2020-12-23 DIAGNOSIS — R262 Difficulty in walking, not elsewhere classified: Secondary | ICD-10-CM | POA: Diagnosis not present

## 2020-12-24 DIAGNOSIS — R488 Other symbolic dysfunctions: Secondary | ICD-10-CM | POA: Diagnosis not present

## 2020-12-24 DIAGNOSIS — R2681 Unsteadiness on feet: Secondary | ICD-10-CM | POA: Diagnosis not present

## 2020-12-24 DIAGNOSIS — Z20828 Contact with and (suspected) exposure to other viral communicable diseases: Secondary | ICD-10-CM | POA: Diagnosis not present

## 2020-12-24 DIAGNOSIS — M6281 Muscle weakness (generalized): Secondary | ICD-10-CM | POA: Diagnosis not present

## 2020-12-24 DIAGNOSIS — R41841 Cognitive communication deficit: Secondary | ICD-10-CM | POA: Diagnosis not present

## 2020-12-24 DIAGNOSIS — R262 Difficulty in walking, not elsewhere classified: Secondary | ICD-10-CM | POA: Diagnosis not present

## 2020-12-25 DIAGNOSIS — M6281 Muscle weakness (generalized): Secondary | ICD-10-CM | POA: Diagnosis not present

## 2020-12-25 DIAGNOSIS — R262 Difficulty in walking, not elsewhere classified: Secondary | ICD-10-CM | POA: Diagnosis not present

## 2020-12-25 DIAGNOSIS — R41841 Cognitive communication deficit: Secondary | ICD-10-CM | POA: Diagnosis not present

## 2020-12-25 DIAGNOSIS — R488 Other symbolic dysfunctions: Secondary | ICD-10-CM | POA: Diagnosis not present

## 2020-12-25 DIAGNOSIS — R2681 Unsteadiness on feet: Secondary | ICD-10-CM | POA: Diagnosis not present

## 2020-12-28 DIAGNOSIS — R488 Other symbolic dysfunctions: Secondary | ICD-10-CM | POA: Diagnosis not present

## 2020-12-28 DIAGNOSIS — R41841 Cognitive communication deficit: Secondary | ICD-10-CM | POA: Diagnosis not present

## 2020-12-28 DIAGNOSIS — R2681 Unsteadiness on feet: Secondary | ICD-10-CM | POA: Diagnosis not present

## 2020-12-28 DIAGNOSIS — M6281 Muscle weakness (generalized): Secondary | ICD-10-CM | POA: Diagnosis not present

## 2020-12-28 DIAGNOSIS — R262 Difficulty in walking, not elsewhere classified: Secondary | ICD-10-CM | POA: Diagnosis not present

## 2020-12-29 DIAGNOSIS — R262 Difficulty in walking, not elsewhere classified: Secondary | ICD-10-CM | POA: Diagnosis not present

## 2020-12-29 DIAGNOSIS — R488 Other symbolic dysfunctions: Secondary | ICD-10-CM | POA: Diagnosis not present

## 2020-12-29 DIAGNOSIS — R41841 Cognitive communication deficit: Secondary | ICD-10-CM | POA: Diagnosis not present

## 2020-12-29 DIAGNOSIS — R2681 Unsteadiness on feet: Secondary | ICD-10-CM | POA: Diagnosis not present

## 2020-12-29 DIAGNOSIS — M6281 Muscle weakness (generalized): Secondary | ICD-10-CM | POA: Diagnosis not present

## 2020-12-30 DIAGNOSIS — R488 Other symbolic dysfunctions: Secondary | ICD-10-CM | POA: Diagnosis not present

## 2020-12-30 DIAGNOSIS — R262 Difficulty in walking, not elsewhere classified: Secondary | ICD-10-CM | POA: Diagnosis not present

## 2020-12-30 DIAGNOSIS — R41841 Cognitive communication deficit: Secondary | ICD-10-CM | POA: Diagnosis not present

## 2020-12-30 DIAGNOSIS — R2681 Unsteadiness on feet: Secondary | ICD-10-CM | POA: Diagnosis not present

## 2020-12-30 DIAGNOSIS — M6281 Muscle weakness (generalized): Secondary | ICD-10-CM | POA: Diagnosis not present

## 2020-12-31 DIAGNOSIS — R2681 Unsteadiness on feet: Secondary | ICD-10-CM | POA: Diagnosis not present

## 2020-12-31 DIAGNOSIS — M6281 Muscle weakness (generalized): Secondary | ICD-10-CM | POA: Diagnosis not present

## 2020-12-31 DIAGNOSIS — R41841 Cognitive communication deficit: Secondary | ICD-10-CM | POA: Diagnosis not present

## 2020-12-31 DIAGNOSIS — Z20828 Contact with and (suspected) exposure to other viral communicable diseases: Secondary | ICD-10-CM | POA: Diagnosis not present

## 2020-12-31 DIAGNOSIS — R262 Difficulty in walking, not elsewhere classified: Secondary | ICD-10-CM | POA: Diagnosis not present

## 2020-12-31 DIAGNOSIS — R488 Other symbolic dysfunctions: Secondary | ICD-10-CM | POA: Diagnosis not present

## 2021-01-01 DIAGNOSIS — R262 Difficulty in walking, not elsewhere classified: Secondary | ICD-10-CM | POA: Diagnosis not present

## 2021-01-01 DIAGNOSIS — R41841 Cognitive communication deficit: Secondary | ICD-10-CM | POA: Diagnosis not present

## 2021-01-01 DIAGNOSIS — R2681 Unsteadiness on feet: Secondary | ICD-10-CM | POA: Diagnosis not present

## 2021-01-01 DIAGNOSIS — R488 Other symbolic dysfunctions: Secondary | ICD-10-CM | POA: Diagnosis not present

## 2021-01-01 DIAGNOSIS — M6281 Muscle weakness (generalized): Secondary | ICD-10-CM | POA: Diagnosis not present

## 2021-01-02 DIAGNOSIS — R2681 Unsteadiness on feet: Secondary | ICD-10-CM | POA: Diagnosis not present

## 2021-01-02 DIAGNOSIS — R488 Other symbolic dysfunctions: Secondary | ICD-10-CM | POA: Diagnosis not present

## 2021-01-02 DIAGNOSIS — R262 Difficulty in walking, not elsewhere classified: Secondary | ICD-10-CM | POA: Diagnosis not present

## 2021-01-02 DIAGNOSIS — R41841 Cognitive communication deficit: Secondary | ICD-10-CM | POA: Diagnosis not present

## 2021-01-02 DIAGNOSIS — M6281 Muscle weakness (generalized): Secondary | ICD-10-CM | POA: Diagnosis not present

## 2021-01-05 DIAGNOSIS — R41841 Cognitive communication deficit: Secondary | ICD-10-CM | POA: Diagnosis not present

## 2021-01-05 DIAGNOSIS — R2681 Unsteadiness on feet: Secondary | ICD-10-CM | POA: Diagnosis not present

## 2021-01-05 DIAGNOSIS — Z20828 Contact with and (suspected) exposure to other viral communicable diseases: Secondary | ICD-10-CM | POA: Diagnosis not present

## 2021-01-05 DIAGNOSIS — R488 Other symbolic dysfunctions: Secondary | ICD-10-CM | POA: Diagnosis not present

## 2021-01-05 DIAGNOSIS — R262 Difficulty in walking, not elsewhere classified: Secondary | ICD-10-CM | POA: Diagnosis not present

## 2021-01-05 DIAGNOSIS — M6281 Muscle weakness (generalized): Secondary | ICD-10-CM | POA: Diagnosis not present

## 2021-01-06 DIAGNOSIS — R2681 Unsteadiness on feet: Secondary | ICD-10-CM | POA: Diagnosis not present

## 2021-01-06 DIAGNOSIS — M6281 Muscle weakness (generalized): Secondary | ICD-10-CM | POA: Diagnosis not present

## 2021-01-06 DIAGNOSIS — R488 Other symbolic dysfunctions: Secondary | ICD-10-CM | POA: Diagnosis not present

## 2021-01-06 DIAGNOSIS — R262 Difficulty in walking, not elsewhere classified: Secondary | ICD-10-CM | POA: Diagnosis not present

## 2021-01-06 DIAGNOSIS — R41841 Cognitive communication deficit: Secondary | ICD-10-CM | POA: Diagnosis not present

## 2021-01-07 DIAGNOSIS — R2681 Unsteadiness on feet: Secondary | ICD-10-CM | POA: Diagnosis not present

## 2021-01-07 DIAGNOSIS — R262 Difficulty in walking, not elsewhere classified: Secondary | ICD-10-CM | POA: Diagnosis not present

## 2021-01-07 DIAGNOSIS — R41841 Cognitive communication deficit: Secondary | ICD-10-CM | POA: Diagnosis not present

## 2021-01-07 DIAGNOSIS — R488 Other symbolic dysfunctions: Secondary | ICD-10-CM | POA: Diagnosis not present

## 2021-01-07 DIAGNOSIS — M6281 Muscle weakness (generalized): Secondary | ICD-10-CM | POA: Diagnosis not present

## 2021-01-08 DIAGNOSIS — R41841 Cognitive communication deficit: Secondary | ICD-10-CM | POA: Diagnosis not present

## 2021-01-08 DIAGNOSIS — R2681 Unsteadiness on feet: Secondary | ICD-10-CM | POA: Diagnosis not present

## 2021-01-08 DIAGNOSIS — R488 Other symbolic dysfunctions: Secondary | ICD-10-CM | POA: Diagnosis not present

## 2021-01-08 DIAGNOSIS — R262 Difficulty in walking, not elsewhere classified: Secondary | ICD-10-CM | POA: Diagnosis not present

## 2021-01-08 DIAGNOSIS — M6281 Muscle weakness (generalized): Secondary | ICD-10-CM | POA: Diagnosis not present

## 2021-01-11 DIAGNOSIS — R2681 Unsteadiness on feet: Secondary | ICD-10-CM | POA: Diagnosis not present

## 2021-01-11 DIAGNOSIS — R488 Other symbolic dysfunctions: Secondary | ICD-10-CM | POA: Diagnosis not present

## 2021-01-11 DIAGNOSIS — R41841 Cognitive communication deficit: Secondary | ICD-10-CM | POA: Diagnosis not present

## 2021-01-11 DIAGNOSIS — M6281 Muscle weakness (generalized): Secondary | ICD-10-CM | POA: Diagnosis not present

## 2021-01-11 DIAGNOSIS — R262 Difficulty in walking, not elsewhere classified: Secondary | ICD-10-CM | POA: Diagnosis not present

## 2021-01-11 DIAGNOSIS — Z20828 Contact with and (suspected) exposure to other viral communicable diseases: Secondary | ICD-10-CM | POA: Diagnosis not present

## 2021-01-12 DIAGNOSIS — M6281 Muscle weakness (generalized): Secondary | ICD-10-CM | POA: Diagnosis not present

## 2021-01-12 DIAGNOSIS — R41841 Cognitive communication deficit: Secondary | ICD-10-CM | POA: Diagnosis not present

## 2021-01-12 DIAGNOSIS — R488 Other symbolic dysfunctions: Secondary | ICD-10-CM | POA: Diagnosis not present

## 2021-01-12 DIAGNOSIS — R262 Difficulty in walking, not elsewhere classified: Secondary | ICD-10-CM | POA: Diagnosis not present

## 2021-01-12 DIAGNOSIS — R2681 Unsteadiness on feet: Secondary | ICD-10-CM | POA: Diagnosis not present

## 2021-01-13 DIAGNOSIS — M6281 Muscle weakness (generalized): Secondary | ICD-10-CM | POA: Diagnosis not present

## 2021-01-13 DIAGNOSIS — R2681 Unsteadiness on feet: Secondary | ICD-10-CM | POA: Diagnosis not present

## 2021-01-13 DIAGNOSIS — R262 Difficulty in walking, not elsewhere classified: Secondary | ICD-10-CM | POA: Diagnosis not present

## 2021-01-13 DIAGNOSIS — R41841 Cognitive communication deficit: Secondary | ICD-10-CM | POA: Diagnosis not present

## 2021-01-13 DIAGNOSIS — R488 Other symbolic dysfunctions: Secondary | ICD-10-CM | POA: Diagnosis not present

## 2021-01-14 DIAGNOSIS — R41841 Cognitive communication deficit: Secondary | ICD-10-CM | POA: Diagnosis not present

## 2021-01-14 DIAGNOSIS — R488 Other symbolic dysfunctions: Secondary | ICD-10-CM | POA: Diagnosis not present

## 2021-01-14 DIAGNOSIS — M6281 Muscle weakness (generalized): Secondary | ICD-10-CM | POA: Diagnosis not present

## 2021-01-14 DIAGNOSIS — Z20828 Contact with and (suspected) exposure to other viral communicable diseases: Secondary | ICD-10-CM | POA: Diagnosis not present

## 2021-01-14 DIAGNOSIS — R2681 Unsteadiness on feet: Secondary | ICD-10-CM | POA: Diagnosis not present

## 2021-01-14 DIAGNOSIS — R262 Difficulty in walking, not elsewhere classified: Secondary | ICD-10-CM | POA: Diagnosis not present

## 2021-01-15 DIAGNOSIS — R2681 Unsteadiness on feet: Secondary | ICD-10-CM | POA: Diagnosis not present

## 2021-01-15 DIAGNOSIS — M6281 Muscle weakness (generalized): Secondary | ICD-10-CM | POA: Diagnosis not present

## 2021-01-15 DIAGNOSIS — R262 Difficulty in walking, not elsewhere classified: Secondary | ICD-10-CM | POA: Diagnosis not present

## 2021-01-15 DIAGNOSIS — R488 Other symbolic dysfunctions: Secondary | ICD-10-CM | POA: Diagnosis not present

## 2021-01-15 DIAGNOSIS — R41841 Cognitive communication deficit: Secondary | ICD-10-CM | POA: Diagnosis not present

## 2021-01-18 DIAGNOSIS — M6281 Muscle weakness (generalized): Secondary | ICD-10-CM | POA: Diagnosis not present

## 2021-01-18 DIAGNOSIS — R488 Other symbolic dysfunctions: Secondary | ICD-10-CM | POA: Diagnosis not present

## 2021-01-18 DIAGNOSIS — R41841 Cognitive communication deficit: Secondary | ICD-10-CM | POA: Diagnosis not present

## 2021-01-18 DIAGNOSIS — R262 Difficulty in walking, not elsewhere classified: Secondary | ICD-10-CM | POA: Diagnosis not present

## 2021-01-18 DIAGNOSIS — R2681 Unsteadiness on feet: Secondary | ICD-10-CM | POA: Diagnosis not present

## 2021-01-19 DIAGNOSIS — M6281 Muscle weakness (generalized): Secondary | ICD-10-CM | POA: Diagnosis not present

## 2021-01-19 DIAGNOSIS — R41841 Cognitive communication deficit: Secondary | ICD-10-CM | POA: Diagnosis not present

## 2021-01-19 DIAGNOSIS — R262 Difficulty in walking, not elsewhere classified: Secondary | ICD-10-CM | POA: Diagnosis not present

## 2021-01-19 DIAGNOSIS — R488 Other symbolic dysfunctions: Secondary | ICD-10-CM | POA: Diagnosis not present

## 2021-01-19 DIAGNOSIS — R2681 Unsteadiness on feet: Secondary | ICD-10-CM | POA: Diagnosis not present

## 2021-01-20 DIAGNOSIS — R262 Difficulty in walking, not elsewhere classified: Secondary | ICD-10-CM | POA: Diagnosis not present

## 2021-01-20 DIAGNOSIS — R41841 Cognitive communication deficit: Secondary | ICD-10-CM | POA: Diagnosis not present

## 2021-01-20 DIAGNOSIS — R488 Other symbolic dysfunctions: Secondary | ICD-10-CM | POA: Diagnosis not present

## 2021-01-20 DIAGNOSIS — M6281 Muscle weakness (generalized): Secondary | ICD-10-CM | POA: Diagnosis not present

## 2021-01-20 DIAGNOSIS — R2681 Unsteadiness on feet: Secondary | ICD-10-CM | POA: Diagnosis not present

## 2021-01-21 DIAGNOSIS — Z20828 Contact with and (suspected) exposure to other viral communicable diseases: Secondary | ICD-10-CM | POA: Diagnosis not present

## 2021-01-21 DIAGNOSIS — R262 Difficulty in walking, not elsewhere classified: Secondary | ICD-10-CM | POA: Diagnosis not present

## 2021-01-21 DIAGNOSIS — R488 Other symbolic dysfunctions: Secondary | ICD-10-CM | POA: Diagnosis not present

## 2021-01-21 DIAGNOSIS — R2681 Unsteadiness on feet: Secondary | ICD-10-CM | POA: Diagnosis not present

## 2021-01-21 DIAGNOSIS — M6281 Muscle weakness (generalized): Secondary | ICD-10-CM | POA: Diagnosis not present

## 2021-01-21 DIAGNOSIS — R41841 Cognitive communication deficit: Secondary | ICD-10-CM | POA: Diagnosis not present

## 2021-01-25 DIAGNOSIS — R262 Difficulty in walking, not elsewhere classified: Secondary | ICD-10-CM | POA: Diagnosis not present

## 2021-01-25 DIAGNOSIS — R488 Other symbolic dysfunctions: Secondary | ICD-10-CM | POA: Diagnosis not present

## 2021-01-25 DIAGNOSIS — R2681 Unsteadiness on feet: Secondary | ICD-10-CM | POA: Diagnosis not present

## 2021-01-25 DIAGNOSIS — R41841 Cognitive communication deficit: Secondary | ICD-10-CM | POA: Diagnosis not present

## 2021-01-25 DIAGNOSIS — M6281 Muscle weakness (generalized): Secondary | ICD-10-CM | POA: Diagnosis not present

## 2021-01-26 DIAGNOSIS — R262 Difficulty in walking, not elsewhere classified: Secondary | ICD-10-CM | POA: Diagnosis not present

## 2021-01-26 DIAGNOSIS — M6281 Muscle weakness (generalized): Secondary | ICD-10-CM | POA: Diagnosis not present

## 2021-01-26 DIAGNOSIS — R2681 Unsteadiness on feet: Secondary | ICD-10-CM | POA: Diagnosis not present

## 2021-01-26 DIAGNOSIS — R488 Other symbolic dysfunctions: Secondary | ICD-10-CM | POA: Diagnosis not present

## 2021-01-26 DIAGNOSIS — R41841 Cognitive communication deficit: Secondary | ICD-10-CM | POA: Diagnosis not present

## 2021-01-27 DIAGNOSIS — R2681 Unsteadiness on feet: Secondary | ICD-10-CM | POA: Diagnosis not present

## 2021-01-27 DIAGNOSIS — M6281 Muscle weakness (generalized): Secondary | ICD-10-CM | POA: Diagnosis not present

## 2021-01-27 DIAGNOSIS — R262 Difficulty in walking, not elsewhere classified: Secondary | ICD-10-CM | POA: Diagnosis not present

## 2021-01-27 DIAGNOSIS — R488 Other symbolic dysfunctions: Secondary | ICD-10-CM | POA: Diagnosis not present

## 2021-01-27 DIAGNOSIS — R41841 Cognitive communication deficit: Secondary | ICD-10-CM | POA: Diagnosis not present

## 2021-01-28 DIAGNOSIS — R262 Difficulty in walking, not elsewhere classified: Secondary | ICD-10-CM | POA: Diagnosis not present

## 2021-01-28 DIAGNOSIS — M6281 Muscle weakness (generalized): Secondary | ICD-10-CM | POA: Diagnosis not present

## 2021-01-28 DIAGNOSIS — R2681 Unsteadiness on feet: Secondary | ICD-10-CM | POA: Diagnosis not present

## 2021-01-28 DIAGNOSIS — Z20828 Contact with and (suspected) exposure to other viral communicable diseases: Secondary | ICD-10-CM | POA: Diagnosis not present

## 2021-01-28 DIAGNOSIS — R41841 Cognitive communication deficit: Secondary | ICD-10-CM | POA: Diagnosis not present

## 2021-01-28 DIAGNOSIS — R488 Other symbolic dysfunctions: Secondary | ICD-10-CM | POA: Diagnosis not present

## 2021-01-29 DIAGNOSIS — R262 Difficulty in walking, not elsewhere classified: Secondary | ICD-10-CM | POA: Diagnosis not present

## 2021-01-29 DIAGNOSIS — R488 Other symbolic dysfunctions: Secondary | ICD-10-CM | POA: Diagnosis not present

## 2021-01-29 DIAGNOSIS — R41841 Cognitive communication deficit: Secondary | ICD-10-CM | POA: Diagnosis not present

## 2021-01-29 DIAGNOSIS — M6281 Muscle weakness (generalized): Secondary | ICD-10-CM | POA: Diagnosis not present

## 2021-01-29 DIAGNOSIS — R2681 Unsteadiness on feet: Secondary | ICD-10-CM | POA: Diagnosis not present

## 2021-02-01 DIAGNOSIS — R262 Difficulty in walking, not elsewhere classified: Secondary | ICD-10-CM | POA: Diagnosis not present

## 2021-02-01 DIAGNOSIS — R488 Other symbolic dysfunctions: Secondary | ICD-10-CM | POA: Diagnosis not present

## 2021-02-01 DIAGNOSIS — Z20828 Contact with and (suspected) exposure to other viral communicable diseases: Secondary | ICD-10-CM | POA: Diagnosis not present

## 2021-02-01 DIAGNOSIS — R2681 Unsteadiness on feet: Secondary | ICD-10-CM | POA: Diagnosis not present

## 2021-02-01 DIAGNOSIS — M6281 Muscle weakness (generalized): Secondary | ICD-10-CM | POA: Diagnosis not present

## 2021-02-01 DIAGNOSIS — R41841 Cognitive communication deficit: Secondary | ICD-10-CM | POA: Diagnosis not present

## 2021-02-02 DIAGNOSIS — R488 Other symbolic dysfunctions: Secondary | ICD-10-CM | POA: Diagnosis not present

## 2021-02-02 DIAGNOSIS — R2681 Unsteadiness on feet: Secondary | ICD-10-CM | POA: Diagnosis not present

## 2021-02-02 DIAGNOSIS — R41841 Cognitive communication deficit: Secondary | ICD-10-CM | POA: Diagnosis not present

## 2021-02-02 DIAGNOSIS — M6281 Muscle weakness (generalized): Secondary | ICD-10-CM | POA: Diagnosis not present

## 2021-02-02 DIAGNOSIS — R262 Difficulty in walking, not elsewhere classified: Secondary | ICD-10-CM | POA: Diagnosis not present

## 2021-02-03 DIAGNOSIS — R488 Other symbolic dysfunctions: Secondary | ICD-10-CM | POA: Diagnosis not present

## 2021-02-03 DIAGNOSIS — R262 Difficulty in walking, not elsewhere classified: Secondary | ICD-10-CM | POA: Diagnosis not present

## 2021-02-03 DIAGNOSIS — R2681 Unsteadiness on feet: Secondary | ICD-10-CM | POA: Diagnosis not present

## 2021-02-03 DIAGNOSIS — R41841 Cognitive communication deficit: Secondary | ICD-10-CM | POA: Diagnosis not present

## 2021-02-03 DIAGNOSIS — M6281 Muscle weakness (generalized): Secondary | ICD-10-CM | POA: Diagnosis not present

## 2021-02-04 DIAGNOSIS — R488 Other symbolic dysfunctions: Secondary | ICD-10-CM | POA: Diagnosis not present

## 2021-02-04 DIAGNOSIS — R41841 Cognitive communication deficit: Secondary | ICD-10-CM | POA: Diagnosis not present

## 2021-02-04 DIAGNOSIS — R262 Difficulty in walking, not elsewhere classified: Secondary | ICD-10-CM | POA: Diagnosis not present

## 2021-02-04 DIAGNOSIS — R2681 Unsteadiness on feet: Secondary | ICD-10-CM | POA: Diagnosis not present

## 2021-02-04 DIAGNOSIS — M6281 Muscle weakness (generalized): Secondary | ICD-10-CM | POA: Diagnosis not present

## 2021-02-05 DIAGNOSIS — R488 Other symbolic dysfunctions: Secondary | ICD-10-CM | POA: Diagnosis not present

## 2021-02-05 DIAGNOSIS — R262 Difficulty in walking, not elsewhere classified: Secondary | ICD-10-CM | POA: Diagnosis not present

## 2021-02-05 DIAGNOSIS — R41841 Cognitive communication deficit: Secondary | ICD-10-CM | POA: Diagnosis not present

## 2021-02-05 DIAGNOSIS — R2681 Unsteadiness on feet: Secondary | ICD-10-CM | POA: Diagnosis not present

## 2021-02-05 DIAGNOSIS — M6281 Muscle weakness (generalized): Secondary | ICD-10-CM | POA: Diagnosis not present

## 2021-02-07 DIAGNOSIS — R262 Difficulty in walking, not elsewhere classified: Secondary | ICD-10-CM | POA: Diagnosis not present

## 2021-02-07 DIAGNOSIS — M6281 Muscle weakness (generalized): Secondary | ICD-10-CM | POA: Diagnosis not present

## 2021-02-07 DIAGNOSIS — R41841 Cognitive communication deficit: Secondary | ICD-10-CM | POA: Diagnosis not present

## 2021-02-07 DIAGNOSIS — R488 Other symbolic dysfunctions: Secondary | ICD-10-CM | POA: Diagnosis not present

## 2021-02-07 DIAGNOSIS — R2681 Unsteadiness on feet: Secondary | ICD-10-CM | POA: Diagnosis not present

## 2021-02-08 DIAGNOSIS — Z20828 Contact with and (suspected) exposure to other viral communicable diseases: Secondary | ICD-10-CM | POA: Diagnosis not present

## 2021-02-09 DIAGNOSIS — R41841 Cognitive communication deficit: Secondary | ICD-10-CM | POA: Diagnosis not present

## 2021-02-09 DIAGNOSIS — R2681 Unsteadiness on feet: Secondary | ICD-10-CM | POA: Diagnosis not present

## 2021-02-09 DIAGNOSIS — M6281 Muscle weakness (generalized): Secondary | ICD-10-CM | POA: Diagnosis not present

## 2021-02-09 DIAGNOSIS — R262 Difficulty in walking, not elsewhere classified: Secondary | ICD-10-CM | POA: Diagnosis not present

## 2021-02-09 DIAGNOSIS — R488 Other symbolic dysfunctions: Secondary | ICD-10-CM | POA: Diagnosis not present

## 2021-02-10 DIAGNOSIS — R41841 Cognitive communication deficit: Secondary | ICD-10-CM | POA: Diagnosis not present

## 2021-02-10 DIAGNOSIS — R488 Other symbolic dysfunctions: Secondary | ICD-10-CM | POA: Diagnosis not present

## 2021-02-10 DIAGNOSIS — R2681 Unsteadiness on feet: Secondary | ICD-10-CM | POA: Diagnosis not present

## 2021-02-10 DIAGNOSIS — M6281 Muscle weakness (generalized): Secondary | ICD-10-CM | POA: Diagnosis not present

## 2021-02-10 DIAGNOSIS — R262 Difficulty in walking, not elsewhere classified: Secondary | ICD-10-CM | POA: Diagnosis not present

## 2021-02-11 DIAGNOSIS — R262 Difficulty in walking, not elsewhere classified: Secondary | ICD-10-CM | POA: Diagnosis not present

## 2021-02-11 DIAGNOSIS — R2681 Unsteadiness on feet: Secondary | ICD-10-CM | POA: Diagnosis not present

## 2021-02-11 DIAGNOSIS — R41841 Cognitive communication deficit: Secondary | ICD-10-CM | POA: Diagnosis not present

## 2021-02-11 DIAGNOSIS — R488 Other symbolic dysfunctions: Secondary | ICD-10-CM | POA: Diagnosis not present

## 2021-02-11 DIAGNOSIS — M6281 Muscle weakness (generalized): Secondary | ICD-10-CM | POA: Diagnosis not present

## 2021-02-12 DIAGNOSIS — R2681 Unsteadiness on feet: Secondary | ICD-10-CM | POA: Diagnosis not present

## 2021-02-12 DIAGNOSIS — R41841 Cognitive communication deficit: Secondary | ICD-10-CM | POA: Diagnosis not present

## 2021-02-12 DIAGNOSIS — R262 Difficulty in walking, not elsewhere classified: Secondary | ICD-10-CM | POA: Diagnosis not present

## 2021-02-12 DIAGNOSIS — R488 Other symbolic dysfunctions: Secondary | ICD-10-CM | POA: Diagnosis not present

## 2021-02-12 DIAGNOSIS — M6281 Muscle weakness (generalized): Secondary | ICD-10-CM | POA: Diagnosis not present

## 2021-02-15 DIAGNOSIS — R262 Difficulty in walking, not elsewhere classified: Secondary | ICD-10-CM | POA: Diagnosis not present

## 2021-02-15 DIAGNOSIS — Z20828 Contact with and (suspected) exposure to other viral communicable diseases: Secondary | ICD-10-CM | POA: Diagnosis not present

## 2021-02-15 DIAGNOSIS — M6281 Muscle weakness (generalized): Secondary | ICD-10-CM | POA: Diagnosis not present

## 2021-02-15 DIAGNOSIS — R41841 Cognitive communication deficit: Secondary | ICD-10-CM | POA: Diagnosis not present

## 2021-02-15 DIAGNOSIS — R2681 Unsteadiness on feet: Secondary | ICD-10-CM | POA: Diagnosis not present

## 2021-02-15 DIAGNOSIS — R488 Other symbolic dysfunctions: Secondary | ICD-10-CM | POA: Diagnosis not present

## 2021-02-16 DIAGNOSIS — R262 Difficulty in walking, not elsewhere classified: Secondary | ICD-10-CM | POA: Diagnosis not present

## 2021-02-16 DIAGNOSIS — R41841 Cognitive communication deficit: Secondary | ICD-10-CM | POA: Diagnosis not present

## 2021-02-16 DIAGNOSIS — R2681 Unsteadiness on feet: Secondary | ICD-10-CM | POA: Diagnosis not present

## 2021-02-16 DIAGNOSIS — R488 Other symbolic dysfunctions: Secondary | ICD-10-CM | POA: Diagnosis not present

## 2021-02-16 DIAGNOSIS — M6281 Muscle weakness (generalized): Secondary | ICD-10-CM | POA: Diagnosis not present

## 2021-02-17 DIAGNOSIS — R2681 Unsteadiness on feet: Secondary | ICD-10-CM | POA: Diagnosis not present

## 2021-02-17 DIAGNOSIS — M6281 Muscle weakness (generalized): Secondary | ICD-10-CM | POA: Diagnosis not present

## 2021-02-17 DIAGNOSIS — R262 Difficulty in walking, not elsewhere classified: Secondary | ICD-10-CM | POA: Diagnosis not present

## 2021-02-17 DIAGNOSIS — R488 Other symbolic dysfunctions: Secondary | ICD-10-CM | POA: Diagnosis not present

## 2021-02-17 DIAGNOSIS — R41841 Cognitive communication deficit: Secondary | ICD-10-CM | POA: Diagnosis not present

## 2021-02-18 DIAGNOSIS — R488 Other symbolic dysfunctions: Secondary | ICD-10-CM | POA: Diagnosis not present

## 2021-02-18 DIAGNOSIS — R262 Difficulty in walking, not elsewhere classified: Secondary | ICD-10-CM | POA: Diagnosis not present

## 2021-02-18 DIAGNOSIS — R2681 Unsteadiness on feet: Secondary | ICD-10-CM | POA: Diagnosis not present

## 2021-02-18 DIAGNOSIS — R41841 Cognitive communication deficit: Secondary | ICD-10-CM | POA: Diagnosis not present

## 2021-02-18 DIAGNOSIS — M6281 Muscle weakness (generalized): Secondary | ICD-10-CM | POA: Diagnosis not present

## 2021-02-22 DIAGNOSIS — R262 Difficulty in walking, not elsewhere classified: Secondary | ICD-10-CM | POA: Diagnosis not present

## 2021-02-22 DIAGNOSIS — R41841 Cognitive communication deficit: Secondary | ICD-10-CM | POA: Diagnosis not present

## 2021-02-22 DIAGNOSIS — R488 Other symbolic dysfunctions: Secondary | ICD-10-CM | POA: Diagnosis not present

## 2021-02-22 DIAGNOSIS — M6281 Muscle weakness (generalized): Secondary | ICD-10-CM | POA: Diagnosis not present

## 2021-02-22 DIAGNOSIS — R2681 Unsteadiness on feet: Secondary | ICD-10-CM | POA: Diagnosis not present

## 2021-02-22 DIAGNOSIS — Z20828 Contact with and (suspected) exposure to other viral communicable diseases: Secondary | ICD-10-CM | POA: Diagnosis not present

## 2021-02-23 DIAGNOSIS — R262 Difficulty in walking, not elsewhere classified: Secondary | ICD-10-CM | POA: Diagnosis not present

## 2021-02-23 DIAGNOSIS — R2681 Unsteadiness on feet: Secondary | ICD-10-CM | POA: Diagnosis not present

## 2021-02-23 DIAGNOSIS — R488 Other symbolic dysfunctions: Secondary | ICD-10-CM | POA: Diagnosis not present

## 2021-02-23 DIAGNOSIS — M6281 Muscle weakness (generalized): Secondary | ICD-10-CM | POA: Diagnosis not present

## 2021-02-23 DIAGNOSIS — R41841 Cognitive communication deficit: Secondary | ICD-10-CM | POA: Diagnosis not present

## 2021-02-24 DIAGNOSIS — R41841 Cognitive communication deficit: Secondary | ICD-10-CM | POA: Diagnosis not present

## 2021-02-24 DIAGNOSIS — R488 Other symbolic dysfunctions: Secondary | ICD-10-CM | POA: Diagnosis not present

## 2021-02-24 DIAGNOSIS — M6281 Muscle weakness (generalized): Secondary | ICD-10-CM | POA: Diagnosis not present

## 2021-02-24 DIAGNOSIS — R2681 Unsteadiness on feet: Secondary | ICD-10-CM | POA: Diagnosis not present

## 2021-02-24 DIAGNOSIS — R262 Difficulty in walking, not elsewhere classified: Secondary | ICD-10-CM | POA: Diagnosis not present

## 2021-02-25 DIAGNOSIS — Z8616 Personal history of COVID-19: Secondary | ICD-10-CM | POA: Diagnosis not present

## 2021-02-26 DIAGNOSIS — M6281 Muscle weakness (generalized): Secondary | ICD-10-CM | POA: Diagnosis not present

## 2021-02-26 DIAGNOSIS — R41841 Cognitive communication deficit: Secondary | ICD-10-CM | POA: Diagnosis not present

## 2021-02-26 DIAGNOSIS — R2681 Unsteadiness on feet: Secondary | ICD-10-CM | POA: Diagnosis not present

## 2021-02-26 DIAGNOSIS — R262 Difficulty in walking, not elsewhere classified: Secondary | ICD-10-CM | POA: Diagnosis not present

## 2021-02-26 DIAGNOSIS — R488 Other symbolic dysfunctions: Secondary | ICD-10-CM | POA: Diagnosis not present

## 2021-03-01 DIAGNOSIS — R41841 Cognitive communication deficit: Secondary | ICD-10-CM | POA: Diagnosis not present

## 2021-03-01 DIAGNOSIS — R262 Difficulty in walking, not elsewhere classified: Secondary | ICD-10-CM | POA: Diagnosis not present

## 2021-03-01 DIAGNOSIS — Z20828 Contact with and (suspected) exposure to other viral communicable diseases: Secondary | ICD-10-CM | POA: Diagnosis not present

## 2021-03-01 DIAGNOSIS — M6281 Muscle weakness (generalized): Secondary | ICD-10-CM | POA: Diagnosis not present

## 2021-03-01 DIAGNOSIS — R488 Other symbolic dysfunctions: Secondary | ICD-10-CM | POA: Diagnosis not present

## 2021-03-01 DIAGNOSIS — R2681 Unsteadiness on feet: Secondary | ICD-10-CM | POA: Diagnosis not present

## 2021-03-02 DIAGNOSIS — R41841 Cognitive communication deficit: Secondary | ICD-10-CM | POA: Diagnosis not present

## 2021-03-02 DIAGNOSIS — M6281 Muscle weakness (generalized): Secondary | ICD-10-CM | POA: Diagnosis not present

## 2021-03-02 DIAGNOSIS — R2681 Unsteadiness on feet: Secondary | ICD-10-CM | POA: Diagnosis not present

## 2021-03-02 DIAGNOSIS — R488 Other symbolic dysfunctions: Secondary | ICD-10-CM | POA: Diagnosis not present

## 2021-03-02 DIAGNOSIS — R262 Difficulty in walking, not elsewhere classified: Secondary | ICD-10-CM | POA: Diagnosis not present

## 2021-03-03 DIAGNOSIS — R262 Difficulty in walking, not elsewhere classified: Secondary | ICD-10-CM | POA: Diagnosis not present

## 2021-03-03 DIAGNOSIS — R41841 Cognitive communication deficit: Secondary | ICD-10-CM | POA: Diagnosis not present

## 2021-03-03 DIAGNOSIS — R488 Other symbolic dysfunctions: Secondary | ICD-10-CM | POA: Diagnosis not present

## 2021-03-03 DIAGNOSIS — R2681 Unsteadiness on feet: Secondary | ICD-10-CM | POA: Diagnosis not present

## 2021-03-03 DIAGNOSIS — M6281 Muscle weakness (generalized): Secondary | ICD-10-CM | POA: Diagnosis not present

## 2021-03-04 DIAGNOSIS — Z20828 Contact with and (suspected) exposure to other viral communicable diseases: Secondary | ICD-10-CM | POA: Diagnosis not present

## 2021-03-04 DIAGNOSIS — R2681 Unsteadiness on feet: Secondary | ICD-10-CM | POA: Diagnosis not present

## 2021-03-04 DIAGNOSIS — R488 Other symbolic dysfunctions: Secondary | ICD-10-CM | POA: Diagnosis not present

## 2021-03-04 DIAGNOSIS — R262 Difficulty in walking, not elsewhere classified: Secondary | ICD-10-CM | POA: Diagnosis not present

## 2021-03-04 DIAGNOSIS — R41841 Cognitive communication deficit: Secondary | ICD-10-CM | POA: Diagnosis not present

## 2021-03-04 DIAGNOSIS — M6281 Muscle weakness (generalized): Secondary | ICD-10-CM | POA: Diagnosis not present

## 2021-03-05 DIAGNOSIS — R262 Difficulty in walking, not elsewhere classified: Secondary | ICD-10-CM | POA: Diagnosis not present

## 2021-03-05 DIAGNOSIS — R488 Other symbolic dysfunctions: Secondary | ICD-10-CM | POA: Diagnosis not present

## 2021-03-05 DIAGNOSIS — M6281 Muscle weakness (generalized): Secondary | ICD-10-CM | POA: Diagnosis not present

## 2021-03-05 DIAGNOSIS — R2681 Unsteadiness on feet: Secondary | ICD-10-CM | POA: Diagnosis not present

## 2021-03-05 DIAGNOSIS — R41841 Cognitive communication deficit: Secondary | ICD-10-CM | POA: Diagnosis not present

## 2021-03-07 DIAGNOSIS — R488 Other symbolic dysfunctions: Secondary | ICD-10-CM | POA: Diagnosis not present

## 2021-03-07 DIAGNOSIS — R41841 Cognitive communication deficit: Secondary | ICD-10-CM | POA: Diagnosis not present

## 2021-03-07 DIAGNOSIS — R262 Difficulty in walking, not elsewhere classified: Secondary | ICD-10-CM | POA: Diagnosis not present

## 2021-03-07 DIAGNOSIS — R2681 Unsteadiness on feet: Secondary | ICD-10-CM | POA: Diagnosis not present

## 2021-03-07 DIAGNOSIS — M6281 Muscle weakness (generalized): Secondary | ICD-10-CM | POA: Diagnosis not present

## 2021-03-08 DIAGNOSIS — Z20828 Contact with and (suspected) exposure to other viral communicable diseases: Secondary | ICD-10-CM | POA: Diagnosis not present

## 2021-03-09 DIAGNOSIS — R488 Other symbolic dysfunctions: Secondary | ICD-10-CM | POA: Diagnosis not present

## 2021-03-09 DIAGNOSIS — R2681 Unsteadiness on feet: Secondary | ICD-10-CM | POA: Diagnosis not present

## 2021-03-09 DIAGNOSIS — R262 Difficulty in walking, not elsewhere classified: Secondary | ICD-10-CM | POA: Diagnosis not present

## 2021-03-09 DIAGNOSIS — R41841 Cognitive communication deficit: Secondary | ICD-10-CM | POA: Diagnosis not present

## 2021-03-09 DIAGNOSIS — M6281 Muscle weakness (generalized): Secondary | ICD-10-CM | POA: Diagnosis not present

## 2021-03-10 DIAGNOSIS — R2681 Unsteadiness on feet: Secondary | ICD-10-CM | POA: Diagnosis not present

## 2021-03-10 DIAGNOSIS — M6281 Muscle weakness (generalized): Secondary | ICD-10-CM | POA: Diagnosis not present

## 2021-03-10 DIAGNOSIS — R41841 Cognitive communication deficit: Secondary | ICD-10-CM | POA: Diagnosis not present

## 2021-03-10 DIAGNOSIS — R488 Other symbolic dysfunctions: Secondary | ICD-10-CM | POA: Diagnosis not present

## 2021-03-10 DIAGNOSIS — R262 Difficulty in walking, not elsewhere classified: Secondary | ICD-10-CM | POA: Diagnosis not present

## 2021-03-11 DIAGNOSIS — R262 Difficulty in walking, not elsewhere classified: Secondary | ICD-10-CM | POA: Diagnosis not present

## 2021-03-11 DIAGNOSIS — R488 Other symbolic dysfunctions: Secondary | ICD-10-CM | POA: Diagnosis not present

## 2021-03-11 DIAGNOSIS — M6281 Muscle weakness (generalized): Secondary | ICD-10-CM | POA: Diagnosis not present

## 2021-03-11 DIAGNOSIS — R2681 Unsteadiness on feet: Secondary | ICD-10-CM | POA: Diagnosis not present

## 2021-03-11 DIAGNOSIS — Z8616 Personal history of COVID-19: Secondary | ICD-10-CM | POA: Diagnosis not present

## 2021-03-11 DIAGNOSIS — R41841 Cognitive communication deficit: Secondary | ICD-10-CM | POA: Diagnosis not present

## 2021-03-13 DIAGNOSIS — R41841 Cognitive communication deficit: Secondary | ICD-10-CM | POA: Diagnosis not present

## 2021-03-13 DIAGNOSIS — R262 Difficulty in walking, not elsewhere classified: Secondary | ICD-10-CM | POA: Diagnosis not present

## 2021-03-13 DIAGNOSIS — R2681 Unsteadiness on feet: Secondary | ICD-10-CM | POA: Diagnosis not present

## 2021-03-13 DIAGNOSIS — R488 Other symbolic dysfunctions: Secondary | ICD-10-CM | POA: Diagnosis not present

## 2021-03-13 DIAGNOSIS — M6281 Muscle weakness (generalized): Secondary | ICD-10-CM | POA: Diagnosis not present

## 2021-03-15 DIAGNOSIS — R488 Other symbolic dysfunctions: Secondary | ICD-10-CM | POA: Diagnosis not present

## 2021-03-15 DIAGNOSIS — Z20828 Contact with and (suspected) exposure to other viral communicable diseases: Secondary | ICD-10-CM | POA: Diagnosis not present

## 2021-03-15 DIAGNOSIS — M6281 Muscle weakness (generalized): Secondary | ICD-10-CM | POA: Diagnosis not present

## 2021-03-15 DIAGNOSIS — R2681 Unsteadiness on feet: Secondary | ICD-10-CM | POA: Diagnosis not present

## 2021-03-15 DIAGNOSIS — R41841 Cognitive communication deficit: Secondary | ICD-10-CM | POA: Diagnosis not present

## 2021-03-15 DIAGNOSIS — R262 Difficulty in walking, not elsewhere classified: Secondary | ICD-10-CM | POA: Diagnosis not present

## 2021-03-16 DIAGNOSIS — R488 Other symbolic dysfunctions: Secondary | ICD-10-CM | POA: Diagnosis not present

## 2021-03-16 DIAGNOSIS — R262 Difficulty in walking, not elsewhere classified: Secondary | ICD-10-CM | POA: Diagnosis not present

## 2021-03-16 DIAGNOSIS — M6281 Muscle weakness (generalized): Secondary | ICD-10-CM | POA: Diagnosis not present

## 2021-03-16 DIAGNOSIS — R41841 Cognitive communication deficit: Secondary | ICD-10-CM | POA: Diagnosis not present

## 2021-03-16 DIAGNOSIS — R2681 Unsteadiness on feet: Secondary | ICD-10-CM | POA: Diagnosis not present

## 2021-03-17 DIAGNOSIS — R262 Difficulty in walking, not elsewhere classified: Secondary | ICD-10-CM | POA: Diagnosis not present

## 2021-03-17 DIAGNOSIS — R2681 Unsteadiness on feet: Secondary | ICD-10-CM | POA: Diagnosis not present

## 2021-03-17 DIAGNOSIS — M6281 Muscle weakness (generalized): Secondary | ICD-10-CM | POA: Diagnosis not present

## 2021-03-17 DIAGNOSIS — R41841 Cognitive communication deficit: Secondary | ICD-10-CM | POA: Diagnosis not present

## 2021-03-17 DIAGNOSIS — R488 Other symbolic dysfunctions: Secondary | ICD-10-CM | POA: Diagnosis not present

## 2021-03-18 DIAGNOSIS — I10 Essential (primary) hypertension: Secondary | ICD-10-CM | POA: Diagnosis not present

## 2021-03-18 DIAGNOSIS — I480 Paroxysmal atrial fibrillation: Secondary | ICD-10-CM | POA: Diagnosis not present

## 2021-03-18 DIAGNOSIS — E7800 Pure hypercholesterolemia, unspecified: Secondary | ICD-10-CM | POA: Diagnosis not present

## 2021-03-18 DIAGNOSIS — Z8616 Personal history of COVID-19: Secondary | ICD-10-CM | POA: Diagnosis not present

## 2021-03-18 DIAGNOSIS — I519 Heart disease, unspecified: Secondary | ICD-10-CM | POA: Diagnosis not present

## 2021-03-18 DIAGNOSIS — Z23 Encounter for immunization: Secondary | ICD-10-CM | POA: Diagnosis not present

## 2021-03-18 DIAGNOSIS — I350 Nonrheumatic aortic (valve) stenosis: Secondary | ICD-10-CM | POA: Diagnosis not present

## 2021-03-19 DIAGNOSIS — R488 Other symbolic dysfunctions: Secondary | ICD-10-CM | POA: Diagnosis not present

## 2021-03-19 DIAGNOSIS — M6281 Muscle weakness (generalized): Secondary | ICD-10-CM | POA: Diagnosis not present

## 2021-03-19 DIAGNOSIS — R262 Difficulty in walking, not elsewhere classified: Secondary | ICD-10-CM | POA: Diagnosis not present

## 2021-03-19 DIAGNOSIS — R41841 Cognitive communication deficit: Secondary | ICD-10-CM | POA: Diagnosis not present

## 2021-03-19 DIAGNOSIS — R2681 Unsteadiness on feet: Secondary | ICD-10-CM | POA: Diagnosis not present

## 2021-03-22 DIAGNOSIS — Z20828 Contact with and (suspected) exposure to other viral communicable diseases: Secondary | ICD-10-CM | POA: Diagnosis not present

## 2021-03-22 DIAGNOSIS — R262 Difficulty in walking, not elsewhere classified: Secondary | ICD-10-CM | POA: Diagnosis not present

## 2021-03-22 DIAGNOSIS — M6281 Muscle weakness (generalized): Secondary | ICD-10-CM | POA: Diagnosis not present

## 2021-03-22 DIAGNOSIS — R41841 Cognitive communication deficit: Secondary | ICD-10-CM | POA: Diagnosis not present

## 2021-03-22 DIAGNOSIS — R488 Other symbolic dysfunctions: Secondary | ICD-10-CM | POA: Diagnosis not present

## 2021-03-22 DIAGNOSIS — R2681 Unsteadiness on feet: Secondary | ICD-10-CM | POA: Diagnosis not present

## 2021-03-23 DIAGNOSIS — R2681 Unsteadiness on feet: Secondary | ICD-10-CM | POA: Diagnosis not present

## 2021-03-23 DIAGNOSIS — R262 Difficulty in walking, not elsewhere classified: Secondary | ICD-10-CM | POA: Diagnosis not present

## 2021-03-23 DIAGNOSIS — M6281 Muscle weakness (generalized): Secondary | ICD-10-CM | POA: Diagnosis not present

## 2021-03-23 DIAGNOSIS — R41841 Cognitive communication deficit: Secondary | ICD-10-CM | POA: Diagnosis not present

## 2021-03-23 DIAGNOSIS — R488 Other symbolic dysfunctions: Secondary | ICD-10-CM | POA: Diagnosis not present

## 2021-03-25 DIAGNOSIS — M6281 Muscle weakness (generalized): Secondary | ICD-10-CM | POA: Diagnosis not present

## 2021-03-25 DIAGNOSIS — R2681 Unsteadiness on feet: Secondary | ICD-10-CM | POA: Diagnosis not present

## 2021-03-25 DIAGNOSIS — R262 Difficulty in walking, not elsewhere classified: Secondary | ICD-10-CM | POA: Diagnosis not present

## 2021-03-25 DIAGNOSIS — R488 Other symbolic dysfunctions: Secondary | ICD-10-CM | POA: Diagnosis not present

## 2021-03-25 DIAGNOSIS — Z8616 Personal history of COVID-19: Secondary | ICD-10-CM | POA: Diagnosis not present

## 2021-03-25 DIAGNOSIS — R41841 Cognitive communication deficit: Secondary | ICD-10-CM | POA: Diagnosis not present

## 2021-03-26 DIAGNOSIS — M6281 Muscle weakness (generalized): Secondary | ICD-10-CM | POA: Diagnosis not present

## 2021-03-26 DIAGNOSIS — R41841 Cognitive communication deficit: Secondary | ICD-10-CM | POA: Diagnosis not present

## 2021-03-26 DIAGNOSIS — R488 Other symbolic dysfunctions: Secondary | ICD-10-CM | POA: Diagnosis not present

## 2021-03-26 DIAGNOSIS — R262 Difficulty in walking, not elsewhere classified: Secondary | ICD-10-CM | POA: Diagnosis not present

## 2021-03-26 DIAGNOSIS — R2681 Unsteadiness on feet: Secondary | ICD-10-CM | POA: Diagnosis not present

## 2021-03-29 DIAGNOSIS — R262 Difficulty in walking, not elsewhere classified: Secondary | ICD-10-CM | POA: Diagnosis not present

## 2021-03-29 DIAGNOSIS — R488 Other symbolic dysfunctions: Secondary | ICD-10-CM | POA: Diagnosis not present

## 2021-03-29 DIAGNOSIS — M6281 Muscle weakness (generalized): Secondary | ICD-10-CM | POA: Diagnosis not present

## 2021-03-29 DIAGNOSIS — R2681 Unsteadiness on feet: Secondary | ICD-10-CM | POA: Diagnosis not present

## 2021-03-29 DIAGNOSIS — R41841 Cognitive communication deficit: Secondary | ICD-10-CM | POA: Diagnosis not present

## 2021-03-31 DIAGNOSIS — M6281 Muscle weakness (generalized): Secondary | ICD-10-CM | POA: Diagnosis not present

## 2021-04-02 DIAGNOSIS — R488 Other symbolic dysfunctions: Secondary | ICD-10-CM | POA: Diagnosis not present

## 2021-04-02 DIAGNOSIS — R2681 Unsteadiness on feet: Secondary | ICD-10-CM | POA: Diagnosis not present

## 2021-04-02 DIAGNOSIS — M6281 Muscle weakness (generalized): Secondary | ICD-10-CM | POA: Diagnosis not present

## 2021-04-02 DIAGNOSIS — R262 Difficulty in walking, not elsewhere classified: Secondary | ICD-10-CM | POA: Diagnosis not present

## 2021-04-02 DIAGNOSIS — R41841 Cognitive communication deficit: Secondary | ICD-10-CM | POA: Diagnosis not present

## 2021-04-05 DIAGNOSIS — R262 Difficulty in walking, not elsewhere classified: Secondary | ICD-10-CM | POA: Diagnosis not present

## 2021-04-05 DIAGNOSIS — R2681 Unsteadiness on feet: Secondary | ICD-10-CM | POA: Diagnosis not present

## 2021-04-05 DIAGNOSIS — R488 Other symbolic dysfunctions: Secondary | ICD-10-CM | POA: Diagnosis not present

## 2021-04-05 DIAGNOSIS — M6281 Muscle weakness (generalized): Secondary | ICD-10-CM | POA: Diagnosis not present

## 2021-04-05 DIAGNOSIS — R41841 Cognitive communication deficit: Secondary | ICD-10-CM | POA: Diagnosis not present

## 2021-04-07 DIAGNOSIS — R2681 Unsteadiness on feet: Secondary | ICD-10-CM | POA: Diagnosis not present

## 2021-04-07 DIAGNOSIS — M6281 Muscle weakness (generalized): Secondary | ICD-10-CM | POA: Diagnosis not present

## 2021-04-07 DIAGNOSIS — R488 Other symbolic dysfunctions: Secondary | ICD-10-CM | POA: Diagnosis not present

## 2021-04-07 DIAGNOSIS — R41841 Cognitive communication deficit: Secondary | ICD-10-CM | POA: Diagnosis not present

## 2021-04-07 DIAGNOSIS — R262 Difficulty in walking, not elsewhere classified: Secondary | ICD-10-CM | POA: Diagnosis not present

## 2021-04-08 DIAGNOSIS — M6281 Muscle weakness (generalized): Secondary | ICD-10-CM | POA: Diagnosis not present

## 2021-04-08 DIAGNOSIS — R2681 Unsteadiness on feet: Secondary | ICD-10-CM | POA: Diagnosis not present

## 2021-04-08 DIAGNOSIS — R41841 Cognitive communication deficit: Secondary | ICD-10-CM | POA: Diagnosis not present

## 2021-04-08 DIAGNOSIS — R488 Other symbolic dysfunctions: Secondary | ICD-10-CM | POA: Diagnosis not present

## 2021-04-08 DIAGNOSIS — R262 Difficulty in walking, not elsewhere classified: Secondary | ICD-10-CM | POA: Diagnosis not present

## 2021-04-09 DIAGNOSIS — R262 Difficulty in walking, not elsewhere classified: Secondary | ICD-10-CM | POA: Diagnosis not present

## 2021-04-09 DIAGNOSIS — R2681 Unsteadiness on feet: Secondary | ICD-10-CM | POA: Diagnosis not present

## 2021-04-09 DIAGNOSIS — M6281 Muscle weakness (generalized): Secondary | ICD-10-CM | POA: Diagnosis not present

## 2021-04-09 DIAGNOSIS — R488 Other symbolic dysfunctions: Secondary | ICD-10-CM | POA: Diagnosis not present

## 2021-04-09 DIAGNOSIS — R41841 Cognitive communication deficit: Secondary | ICD-10-CM | POA: Diagnosis not present

## 2021-04-12 DIAGNOSIS — R488 Other symbolic dysfunctions: Secondary | ICD-10-CM | POA: Diagnosis not present

## 2021-04-12 DIAGNOSIS — R262 Difficulty in walking, not elsewhere classified: Secondary | ICD-10-CM | POA: Diagnosis not present

## 2021-04-12 DIAGNOSIS — M6281 Muscle weakness (generalized): Secondary | ICD-10-CM | POA: Diagnosis not present

## 2021-04-12 DIAGNOSIS — R41841 Cognitive communication deficit: Secondary | ICD-10-CM | POA: Diagnosis not present

## 2021-04-12 DIAGNOSIS — R2681 Unsteadiness on feet: Secondary | ICD-10-CM | POA: Diagnosis not present

## 2021-04-13 DIAGNOSIS — M6281 Muscle weakness (generalized): Secondary | ICD-10-CM | POA: Diagnosis not present

## 2021-04-13 DIAGNOSIS — R2681 Unsteadiness on feet: Secondary | ICD-10-CM | POA: Diagnosis not present

## 2021-04-13 DIAGNOSIS — R41841 Cognitive communication deficit: Secondary | ICD-10-CM | POA: Diagnosis not present

## 2021-04-13 DIAGNOSIS — R488 Other symbolic dysfunctions: Secondary | ICD-10-CM | POA: Diagnosis not present

## 2021-04-13 DIAGNOSIS — R262 Difficulty in walking, not elsewhere classified: Secondary | ICD-10-CM | POA: Diagnosis not present

## 2021-04-14 DIAGNOSIS — R262 Difficulty in walking, not elsewhere classified: Secondary | ICD-10-CM | POA: Diagnosis not present

## 2021-04-14 DIAGNOSIS — R2681 Unsteadiness on feet: Secondary | ICD-10-CM | POA: Diagnosis not present

## 2021-04-14 DIAGNOSIS — R488 Other symbolic dysfunctions: Secondary | ICD-10-CM | POA: Diagnosis not present

## 2021-04-14 DIAGNOSIS — M6281 Muscle weakness (generalized): Secondary | ICD-10-CM | POA: Diagnosis not present

## 2021-04-14 DIAGNOSIS — R41841 Cognitive communication deficit: Secondary | ICD-10-CM | POA: Diagnosis not present

## 2021-04-15 DIAGNOSIS — R488 Other symbolic dysfunctions: Secondary | ICD-10-CM | POA: Diagnosis not present

## 2021-04-15 DIAGNOSIS — M6281 Muscle weakness (generalized): Secondary | ICD-10-CM | POA: Diagnosis not present

## 2021-04-15 DIAGNOSIS — Z8616 Personal history of COVID-19: Secondary | ICD-10-CM | POA: Diagnosis not present

## 2021-04-15 DIAGNOSIS — R262 Difficulty in walking, not elsewhere classified: Secondary | ICD-10-CM | POA: Diagnosis not present

## 2021-04-15 DIAGNOSIS — R41841 Cognitive communication deficit: Secondary | ICD-10-CM | POA: Diagnosis not present

## 2021-04-15 DIAGNOSIS — R2681 Unsteadiness on feet: Secondary | ICD-10-CM | POA: Diagnosis not present

## 2021-04-16 DIAGNOSIS — R41841 Cognitive communication deficit: Secondary | ICD-10-CM | POA: Diagnosis not present

## 2021-04-16 DIAGNOSIS — R2681 Unsteadiness on feet: Secondary | ICD-10-CM | POA: Diagnosis not present

## 2021-04-16 DIAGNOSIS — M6281 Muscle weakness (generalized): Secondary | ICD-10-CM | POA: Diagnosis not present

## 2021-04-16 DIAGNOSIS — R262 Difficulty in walking, not elsewhere classified: Secondary | ICD-10-CM | POA: Diagnosis not present

## 2021-04-16 DIAGNOSIS — R488 Other symbolic dysfunctions: Secondary | ICD-10-CM | POA: Diagnosis not present

## 2021-04-19 DIAGNOSIS — R262 Difficulty in walking, not elsewhere classified: Secondary | ICD-10-CM | POA: Diagnosis not present

## 2021-04-19 DIAGNOSIS — M6281 Muscle weakness (generalized): Secondary | ICD-10-CM | POA: Diagnosis not present

## 2021-04-19 DIAGNOSIS — R2681 Unsteadiness on feet: Secondary | ICD-10-CM | POA: Diagnosis not present

## 2021-04-19 DIAGNOSIS — R41841 Cognitive communication deficit: Secondary | ICD-10-CM | POA: Diagnosis not present

## 2021-04-19 DIAGNOSIS — R488 Other symbolic dysfunctions: Secondary | ICD-10-CM | POA: Diagnosis not present

## 2021-04-20 DIAGNOSIS — M6281 Muscle weakness (generalized): Secondary | ICD-10-CM | POA: Diagnosis not present

## 2021-04-20 DIAGNOSIS — R262 Difficulty in walking, not elsewhere classified: Secondary | ICD-10-CM | POA: Diagnosis not present

## 2021-04-20 DIAGNOSIS — R2681 Unsteadiness on feet: Secondary | ICD-10-CM | POA: Diagnosis not present

## 2021-04-20 DIAGNOSIS — R41841 Cognitive communication deficit: Secondary | ICD-10-CM | POA: Diagnosis not present

## 2021-04-20 DIAGNOSIS — R488 Other symbolic dysfunctions: Secondary | ICD-10-CM | POA: Diagnosis not present

## 2021-04-21 DIAGNOSIS — R262 Difficulty in walking, not elsewhere classified: Secondary | ICD-10-CM | POA: Diagnosis not present

## 2021-04-21 DIAGNOSIS — R2681 Unsteadiness on feet: Secondary | ICD-10-CM | POA: Diagnosis not present

## 2021-04-21 DIAGNOSIS — R488 Other symbolic dysfunctions: Secondary | ICD-10-CM | POA: Diagnosis not present

## 2021-04-21 DIAGNOSIS — M6281 Muscle weakness (generalized): Secondary | ICD-10-CM | POA: Diagnosis not present

## 2021-04-21 DIAGNOSIS — R41841 Cognitive communication deficit: Secondary | ICD-10-CM | POA: Diagnosis not present

## 2021-04-23 DIAGNOSIS — R41841 Cognitive communication deficit: Secondary | ICD-10-CM | POA: Diagnosis not present

## 2021-04-23 DIAGNOSIS — R262 Difficulty in walking, not elsewhere classified: Secondary | ICD-10-CM | POA: Diagnosis not present

## 2021-04-23 DIAGNOSIS — R488 Other symbolic dysfunctions: Secondary | ICD-10-CM | POA: Diagnosis not present

## 2021-04-23 DIAGNOSIS — R2681 Unsteadiness on feet: Secondary | ICD-10-CM | POA: Diagnosis not present

## 2021-04-23 DIAGNOSIS — M6281 Muscle weakness (generalized): Secondary | ICD-10-CM | POA: Diagnosis not present

## 2021-04-28 DIAGNOSIS — R488 Other symbolic dysfunctions: Secondary | ICD-10-CM | POA: Diagnosis not present

## 2021-04-28 DIAGNOSIS — M6281 Muscle weakness (generalized): Secondary | ICD-10-CM | POA: Diagnosis not present

## 2021-04-28 DIAGNOSIS — R262 Difficulty in walking, not elsewhere classified: Secondary | ICD-10-CM | POA: Diagnosis not present

## 2021-04-28 DIAGNOSIS — R2681 Unsteadiness on feet: Secondary | ICD-10-CM | POA: Diagnosis not present

## 2021-04-28 DIAGNOSIS — R41841 Cognitive communication deficit: Secondary | ICD-10-CM | POA: Diagnosis not present

## 2021-04-29 DIAGNOSIS — M6281 Muscle weakness (generalized): Secondary | ICD-10-CM | POA: Diagnosis not present

## 2021-04-29 DIAGNOSIS — R488 Other symbolic dysfunctions: Secondary | ICD-10-CM | POA: Diagnosis not present

## 2021-04-29 DIAGNOSIS — R2681 Unsteadiness on feet: Secondary | ICD-10-CM | POA: Diagnosis not present

## 2021-04-29 DIAGNOSIS — R41841 Cognitive communication deficit: Secondary | ICD-10-CM | POA: Diagnosis not present

## 2021-04-29 DIAGNOSIS — R262 Difficulty in walking, not elsewhere classified: Secondary | ICD-10-CM | POA: Diagnosis not present

## 2021-05-06 DIAGNOSIS — R2681 Unsteadiness on feet: Secondary | ICD-10-CM | POA: Diagnosis not present

## 2021-05-06 DIAGNOSIS — R262 Difficulty in walking, not elsewhere classified: Secondary | ICD-10-CM | POA: Diagnosis not present

## 2021-05-06 DIAGNOSIS — M6281 Muscle weakness (generalized): Secondary | ICD-10-CM | POA: Diagnosis not present

## 2021-05-11 DIAGNOSIS — M6281 Muscle weakness (generalized): Secondary | ICD-10-CM | POA: Diagnosis not present

## 2021-05-11 DIAGNOSIS — R262 Difficulty in walking, not elsewhere classified: Secondary | ICD-10-CM | POA: Diagnosis not present

## 2021-05-11 DIAGNOSIS — R2681 Unsteadiness on feet: Secondary | ICD-10-CM | POA: Diagnosis not present

## 2021-05-15 DIAGNOSIS — R2681 Unsteadiness on feet: Secondary | ICD-10-CM | POA: Diagnosis not present

## 2021-05-15 DIAGNOSIS — M6281 Muscle weakness (generalized): Secondary | ICD-10-CM | POA: Diagnosis not present

## 2021-05-15 DIAGNOSIS — R262 Difficulty in walking, not elsewhere classified: Secondary | ICD-10-CM | POA: Diagnosis not present

## 2021-07-08 DIAGNOSIS — Z20822 Contact with and (suspected) exposure to covid-19: Secondary | ICD-10-CM | POA: Diagnosis not present

## 2021-07-13 DIAGNOSIS — Z20822 Contact with and (suspected) exposure to covid-19: Secondary | ICD-10-CM | POA: Diagnosis not present

## 2021-08-12 DIAGNOSIS — Z20822 Contact with and (suspected) exposure to covid-19: Secondary | ICD-10-CM | POA: Diagnosis not present

## 2021-11-12 ENCOUNTER — Emergency Department (HOSPITAL_COMMUNITY): Payer: Medicare Other

## 2021-11-12 ENCOUNTER — Other Ambulatory Visit: Payer: Self-pay

## 2021-11-12 ENCOUNTER — Emergency Department (HOSPITAL_COMMUNITY)
Admission: EM | Admit: 2021-11-12 | Discharge: 2021-11-12 | Disposition: A | Discharge status: Home / Self Care | Payer: Medicare Other | Attending: Emergency Medicine | Admitting: Emergency Medicine

## 2021-11-12 DIAGNOSIS — R001 Bradycardia, unspecified: Secondary | ICD-10-CM | POA: Diagnosis not present

## 2021-11-12 DIAGNOSIS — I10 Essential (primary) hypertension: Secondary | ICD-10-CM | POA: Insufficient documentation

## 2021-11-12 DIAGNOSIS — J9811 Atelectasis: Secondary | ICD-10-CM | POA: Diagnosis not present

## 2021-11-12 DIAGNOSIS — F0390 Unspecified dementia without behavioral disturbance: Secondary | ICD-10-CM | POA: Diagnosis not present

## 2021-11-12 DIAGNOSIS — R0789 Other chest pain: Secondary | ICD-10-CM | POA: Diagnosis not present

## 2021-11-12 DIAGNOSIS — Z79899 Other long term (current) drug therapy: Secondary | ICD-10-CM | POA: Insufficient documentation

## 2021-11-12 DIAGNOSIS — R0689 Other abnormalities of breathing: Secondary | ICD-10-CM | POA: Diagnosis not present

## 2021-11-12 DIAGNOSIS — R079 Chest pain, unspecified: Secondary | ICD-10-CM | POA: Insufficient documentation

## 2021-11-12 DIAGNOSIS — Z7902 Long term (current) use of antithrombotics/antiplatelets: Secondary | ICD-10-CM | POA: Insufficient documentation

## 2021-11-12 DIAGNOSIS — R231 Pallor: Secondary | ICD-10-CM | POA: Diagnosis not present

## 2021-11-12 DIAGNOSIS — R0902 Hypoxemia: Secondary | ICD-10-CM | POA: Diagnosis not present

## 2021-11-12 DIAGNOSIS — R0602 Shortness of breath: Secondary | ICD-10-CM | POA: Diagnosis not present

## 2021-11-12 LAB — BASIC METABOLIC PANEL WITH GFR
Anion gap: 11 (ref 5–15)
BUN: 15 mg/dL (ref 8–23)
CO2: 24 mmol/L (ref 22–32)
Calcium: 9.9 mg/dL (ref 8.9–10.3)
Chloride: 104 mmol/L (ref 98–111)
Creatinine, Ser: 1.14 mg/dL — ABNORMAL HIGH (ref 0.44–1.00)
GFR, Estimated: 45 mL/min — ABNORMAL LOW
Glucose, Bld: 121 mg/dL — ABNORMAL HIGH (ref 70–99)
Potassium: 4.2 mmol/L (ref 3.5–5.1)
Sodium: 139 mmol/L (ref 135–145)

## 2021-11-12 LAB — CBC
HCT: 37 % (ref 36.0–46.0)
Hemoglobin: 11.6 g/dL — ABNORMAL LOW (ref 12.0–15.0)
MCH: 27.2 pg (ref 26.0–34.0)
MCHC: 31.4 g/dL (ref 30.0–36.0)
MCV: 86.7 fL (ref 80.0–100.0)
Platelets: 219 K/uL (ref 150–400)
RBC: 4.27 MIL/uL (ref 3.87–5.11)
RDW: 17.8 % — ABNORMAL HIGH (ref 11.5–15.5)
WBC: 6.8 K/uL (ref 4.0–10.5)
nRBC: 0 % (ref 0.0–0.2)

## 2021-11-12 LAB — CBG MONITORING, ED: Glucose-Capillary: 118 mg/dL — ABNORMAL HIGH (ref 70–99)

## 2021-11-12 LAB — TROPONIN I (HIGH SENSITIVITY)
Troponin I (High Sensitivity): 13 ng/L (ref <18)
Troponin I (High Sensitivity): 13 ng/L

## 2021-11-12 LAB — D-DIMER, QUANTITATIVE: D-Dimer, Quant: 0.58 ug/mL-FEU — ABNORMAL HIGH (ref 0.00–0.50)

## 2021-11-12 MED ORDER — ASPIRIN 81 MG PO CHEW: 324.0000 mg | CHEWABLE_TABLET | Freq: Once | ORAL | Status: DC

## 2021-11-12 NOTE — ED Provider Notes (Signed)
?[facility] ?Provider Note ? ? ?CSN: [id number] ?Arrival date & time: 11/12/21  1309 ? ?  ? ?History ? ?Chief Complaint  ?Patient presents with  ? Chest Pain  ? ? ?[name] is a 88 y.o. female. ?Patient had history of anxiety, hypertension, peripheral vascular disease, gastritis, GERD, PE, CVA, dementia ? ?Chest Pain ?Associated symptoms: no fever   ? ?Patient presents to the ER for evaluation of chest pain.  Patient apparently was having her hair done at about 1145 this morning.  While sitting there she started complaining of central chest pain.  Apparently she also complained of some shortness of breath.  EMS was called.  Patient was given aspirin and 4 sublingual nitro by EMS.  Her symptoms improved.  Patient was noted to be bradycardic as well on arrival. ? ?Patient told me she was not sure why she was here.  She states they ran a lot of tests before in the past.  She states she did not call EMS and was not sure why were they were called.  She states she feels fine now.  When I mention to her that she was sent for evaluation of chest pain she did admit to telling the hairdresser that she had been having chest pain ? ?Home Medications ?Prior to Admission medications   ?Medication Sig Start Date End Date Taking? Authorizing Provider  ?acetaminophen (TYLENOL) 500 MG tablet Take 1,000 mg by mouth every 6 (six) hours as needed for headache (pain).    [provider]  ?amiodarone (PACERONE) 100 MG tablet Take 1 tablet (100 mg total) by mouth daily. 09/03/19   Dixie Dials, MD  ?amLODipine (NORVASC) 5 MG tablet Take 1 tablet (5 mg total) by mouth daily. 05/19/19   Domenic Polite, MD  ?atorvastatin (LIPITOR) 40 MG tablet Take 1 tablet (40 mg total) by mouth daily at 6 PM. 09/02/19   Dixie Dials, MD  ?clopidogrel (PLAVIX) 75 MG tablet Take 1 tablet (75 mg total) by mouth daily. 09/02/19   Dixie Dials, MD  ?diclofenac sodium (VOLTAREN) 1 % GEL APPLY 2-4 GRAMS TO  AFFECTED AREA 2-3 TIMES A DAY AS NEEDED. ?Patient taking differently: Apply 2-4 g topically 2 (two) times daily.  04/30/19   Meredith Pel, MD  ?esomeprazole (NEXIUM) 40 MG capsule Take 40 mg by mouth daily before breakfast.     [provider]  ?furosemide (LASIX) 40 MG tablet Take 40 mg by mouth daily. 03/04/19   [provider]  ?isosorbide mononitrate (IMDUR) 30 MG 24 hr tablet Take 1 tablet (30 mg total) by mouth daily. 09/02/19   Dixie Dials, MD  ?levothyroxine (SYNTHROID) 50 MCG tablet Take 50 mcg by mouth daily. 03/04/19   [provider]  ?losartan (COZAAR) 25 MG tablet Take 1 tablet (25 mg total) by mouth daily. 09/03/19   Dixie Dials, MD  ?Melatonin 3 MG TABS Take 3 mg by mouth at bedtime as needed (sleep).     [provider]  ?metoprolol tartrate (LOPRESSOR) 50 MG tablet Take 0.5 tablets (25 mg total) by mouth 2 (two) times daily. 09/02/19   Dixie Dials, MD  ?mupirocin ointment (BACTROBAN) 2 % Place 1 application into the nose 2 (two) times daily. 09/02/19   Dixie Dials, MD  ?nitroGLYCERIN (NITROSTAT) 0.4 MG SL tablet Place 1 tablet (0.4 mg total) under the tongue every 5 (five) minutes x 3 doses as needed for chest pain. 09/02/19   Dixie Dials, MD  ?Polyethyl Glycol-Propyl  Glycol (SYSTANE) 0.4-0.3 % GEL ophthalmic gel Place 1 application into both eyes at bedtime.    [provider]  ?Polyethyl Glycol-Propyl Glycol (SYSTANE) 0.4-0.3 % SOLN Place 1 drop into both eyes every 2 (two) hours.    [provider]  ?potassium chloride SA (KLOR-CON M20) 20 MEQ tablet Take 1 tablet (20 mEq total) by mouth daily. 10/16/13   Noralee Space, MD  ?   ? ?Allergies    ?Codeine and Pregabalin   ? ?Review of Systems   ?Review of Systems  ?Constitutional:  Negative for fever.  ?Cardiovascular:  Positive for chest pain.  ? ?Physical Exam ?Updated Vital Signs ?BP (!) 142/63   Pulse (!) 48   Temp (!) 97.2 ?F (36.2 ?C) (Oral)   Resp (!) 21   Ht 1.676 m ('5\' 6"'$ )    Wt 75 kg   SpO2 98%   BMI 26.69 kg/m?  ?Physical Exam ?Vitals and nursing note reviewed.  ?Constitutional:   ?   Appearance: She is well-developed. She is not diaphoretic.  ?HENT:  ?   Head: Normocephalic and atraumatic.  ?   Right Ear: External ear normal.  ?   Left Ear: External ear normal.  ?Eyes:  ?   General: No scleral icterus.    ?   Right eye: No discharge.     ?   Left eye: No discharge.  ?   Conjunctiva/sclera: Conjunctivae normal.  ?Neck:  ?   Trachea: No tracheal deviation.  ?Cardiovascular:  ?   Rate and Rhythm: Regular rhythm. Bradycardia present.  ?Pulmonary:  ?   Effort: Pulmonary effort is normal. No respiratory distress.  ?   Breath sounds: Normal breath sounds. No stridor. No wheezing or rales.  ?Abdominal:  ?   General: Bowel sounds are normal. There is no distension.  ?   Palpations: Abdomen is soft.  ?   Tenderness: There is no abdominal tenderness. There is no guarding or rebound.  ?Musculoskeletal:     ?   General: No tenderness or deformity.  ?   Cervical back: Neck supple.  ?Skin: ?   General: Skin is warm and dry.  ?   Findings: No rash.  ?Neurological:  ?   General: No focal deficit present.  ?   Mental Status: She is alert.  ?   Cranial Nerves: No cranial nerve deficit (no facial droop, extraocular movements intact, no slurred speech).  ?   Sensory: No sensory deficit.  ?   Motor: No abnormal muscle tone or seizure activity.  ?   Coordination: Coordination normal.  ?Psychiatric:     ?   Mood and Affect: Mood normal.  ? ? ?ED Results / Procedures / Treatments   ?Labs ?(all labs ordered are listed, but only abnormal results are displayed) ?Labs Reviewed  ?BASIC METABOLIC PANEL - Abnormal; Notable for the following components:  ?    Result Value  ? Glucose, Bld 121 (*)   ? Creatinine, Ser 1.14 (*)   ? GFR, Estimated 45 (*)   ? All other components within normal limits  ?CBC - Abnormal; Notable for the following components:  ? Hemoglobin 11.6 (*)   ? RDW 17.8 (*)   ? All other components  within normal limits  ?D-DIMER, QUANTITATIVE - Abnormal; Notable for the following components:  ? D-Dimer, Quant 0.58 (*)   ? All other components within normal limits  ?CBG MONITORING, ED - Abnormal; Notable for the following components:  ? Glucose-Capillary 118 (*)   ?  All other components within normal limits  ?TROPONIN I (HIGH SENSITIVITY)  ?TROPONIN I (HIGH SENSITIVITY)  ? ? ?EKG ?EKG Interpretation ? ?Date/Time:  Friday Nov 12 2021 13:09:49 EDT ?Ventricular Rate:  49 ?PR Interval:    ?QRS Duration: 160 ?QT Interval:  541 ?QTC Calculation: 489 ?R Axis:   -26 ?Text Interpretation: Atrial fibrillation Left bundle branch block Since last tracing rate slower Confirmed by Dorie Rank [phone]) on 11/12/2021 1:31:50 PM ? ?Radiology ?DG Chest Portable 1 View ? ?Result Date: 11/12/2021 ?CLINICAL DATA:  Chest pain. EXAM: PORTABLE CHEST 1 VIEW COMPARISON:  08/30/2019; 02/22/2017 FINDINGS: Grossly unchanged cardiac silhouette and mediastinal contours with atherosclerotic plaque within the thoracic aorta. Improved aeration of the lungs with persistent bibasilar opacities favored to represent atelectasis. No new focal airspace opacities. No pleural effusion or pneumothorax. No evidence of edema. No acute osseous abnormalities. Mild-to-moderate degenerative change of the bilateral glenohumeral joints is suspected though incompletely evaluated. Old right mid clavicular fracture. Postoperative change of the right breast. IMPRESSION: 1. Similar findings of borderline cardiomegaly and bibasilar atelectasis without superimposed acute cardiopulmonary disease on this AP portable examination. 2.  Aortic Atherosclerosis (ICD10-I70.0). Electronically Signed   By: Sandi Mariscal M.D.   On: 11/12/2021 14:29   ? ?Procedures ?Procedures  ? ? ?Medications Ordered in ED ?Medications - No data to display ? ? ?ED Course/ Medical Decision Making/ A&P ?Clinical Course as of 11/12/21 1541  ?Fri Nov 12, 2021  ?1443 D-dimer, quantitative(!) ?D-dimer is  age-adjusted normal range [JK]  ?1443 CBC(!) ?Hemoglobin is stable compared to previous values [JK]  ?1443 DG Chest Portable 1 View ?Chest x-ray with stable cardiomegaly.  Images and radiology report reviewed [JK]  ?1

## 2021-11-12 NOTE — ED Triage Notes (Signed)
Patient arrives via [facility] after having chest pain starting at 1145 this morning while having her hair done. Patient was seated while this was happening and felt pain central chest and states she does feel the same pain bilaterally in her arms. Pt was also experiencing some shortness of breath with this episode. Per EMS patient tachypneic in the 30-40s. Pt w/ hx of Pe's. Pt bradycardic w/ LBB in the 40s. BP stable. EMS administered 324 ASA .4 sublingual nitro with pain improvement per patient. ?

## 2021-11-12 NOTE — ED Notes (Signed)
Called to patient's room repeatedly as pt is "ready to go home"   ?

## 2021-11-12 NOTE — Discharge Instructions (Signed)
1.  Follow-up with your doctor as soon as possible. ?2.  Continue your regularly prescribed medications. ?3.  Return to the emergency department if you have new worsening or concerning symptoms. ?

## 2021-11-12 NOTE — ED Notes (Signed)
Daughter, Ivin Booty called and on her way to pick up patient. Will call when here.  Pt dressed and ready to go ?

## 2021-11-26 ENCOUNTER — Emergency Department (HOSPITAL_COMMUNITY): Payer: Medicare Other

## 2021-11-26 ENCOUNTER — Other Ambulatory Visit: Payer: Self-pay

## 2021-11-26 ENCOUNTER — Emergency Department (HOSPITAL_COMMUNITY)
Admission: EM | Admit: 2021-11-26 | Discharge: 2021-11-26 | Disposition: A | Discharge status: Home / Self Care | Payer: Medicare Other | Attending: Emergency Medicine | Admitting: Emergency Medicine

## 2021-11-26 DIAGNOSIS — R457 State of emotional shock and stress, unspecified: Secondary | ICD-10-CM | POA: Diagnosis not present

## 2021-11-26 DIAGNOSIS — R0602 Shortness of breath: Secondary | ICD-10-CM | POA: Insufficient documentation

## 2021-11-26 DIAGNOSIS — I509 Heart failure, unspecified: Secondary | ICD-10-CM

## 2021-11-26 DIAGNOSIS — R0789 Other chest pain: Secondary | ICD-10-CM | POA: Diagnosis not present

## 2021-11-26 DIAGNOSIS — I110 Hypertensive heart disease with heart failure: Secondary | ICD-10-CM | POA: Diagnosis not present

## 2021-11-26 DIAGNOSIS — I129 Hypertensive chronic kidney disease with stage 1 through stage 4 chronic kidney disease, or unspecified chronic kidney disease: Secondary | ICD-10-CM | POA: Insufficient documentation

## 2021-11-26 DIAGNOSIS — I959 Hypotension, unspecified: Secondary | ICD-10-CM | POA: Diagnosis not present

## 2021-11-26 DIAGNOSIS — N189 Chronic kidney disease, unspecified: Secondary | ICD-10-CM | POA: Diagnosis not present

## 2021-11-26 DIAGNOSIS — R079 Chest pain, unspecified: Secondary | ICD-10-CM | POA: Insufficient documentation

## 2021-11-26 DIAGNOSIS — R202 Paresthesia of skin: Secondary | ICD-10-CM | POA: Diagnosis not present

## 2021-11-26 LAB — CBC
HCT: 37.2 % (ref 36.0–46.0)
Hemoglobin: 11.7 g/dL — ABNORMAL LOW (ref 12.0–15.0)
MCH: 27.5 pg (ref 26.0–34.0)
MCHC: 31.5 g/dL (ref 30.0–36.0)
MCV: 87.3 fL (ref 80.0–100.0)
Platelets: 227 10*3/uL (ref 150–400)
RBC: 4.26 MIL/uL (ref 3.87–5.11)
RDW: 17.9 % — ABNORMAL HIGH (ref 11.5–15.5)
WBC: 7.4 10*3/uL (ref 4.0–10.5)
nRBC: 0 % (ref 0.0–0.2)

## 2021-11-26 LAB — BASIC METABOLIC PANEL
Anion gap: 12 (ref 5–15)
BUN: 19 mg/dL (ref 8–23)
CO2: 21 mmol/L — ABNORMAL LOW (ref 22–32)
Calcium: 9.4 mg/dL (ref 8.9–10.3)
Chloride: 103 mmol/L (ref 98–111)
Creatinine, Ser: 1.14 mg/dL — ABNORMAL HIGH (ref 0.44–1.00)
GFR, Estimated: 45 mL/min — ABNORMAL LOW (ref >60)
Glucose, Bld: 142 mg/dL — ABNORMAL HIGH (ref 70–99)
Potassium: 3.7 mmol/L (ref 3.5–5.1)
Sodium: 136 mmol/L (ref 135–145)

## 2021-11-26 LAB — TROPONIN I (HIGH SENSITIVITY)
Troponin I (High Sensitivity): 13 ng/L (ref <18)
Troponin I (High Sensitivity): 13 ng/L (ref <18)

## 2021-11-26 LAB — BRAIN NATRIURETIC PEPTIDE: B Natriuretic Peptide: 356.9 pg/mL — ABNORMAL HIGH (ref 0.0–100.0)

## 2021-11-26 LAB — D-DIMER, QUANTITATIVE: D-Dimer, Quant: 0.7 ug/mL-FEU — ABNORMAL HIGH (ref 0.00–0.50)

## 2021-11-26 MED ORDER — FUROSEMIDE 10 MG/ML IJ SOLN
40.0000 mg | Freq: Once | INTRAMUSCULAR | Status: AC
  Administered 2021-11-26: 40 mg via INTRAVENOUS
  Filled 2021-11-26: qty 4

## 2021-11-26 NOTE — Discharge Instructions (Addendum)
Your symptoms likely due to fluid overload.  Please continue taking your medication and follow-up closely with cardiology in the next few days for further care.

## 2021-11-26 NOTE — ED Triage Notes (Signed)
Pt BIB [facility] from [facility] for CP and SOB.  Pt stated she woke up with the CP this morning.  Per EMS Pt couldn't describe the pain or give any other details related to the CP.   EMS gave 324 ASA.   VS 124/51 96% RA, HR66

## 2021-11-26 NOTE — ED Provider Notes (Signed)
Patient is a 88 year old female whose care was transferred to me [name] at shift change.  Please see his HPI below:  [name] is a 88 y.o. female.   The history is provided by the patient and medical records. No language interpreter was used.    88 year old female with significant history of hypertension, anxiety, GERD, kidney disease, cardiac disease brought here via EMS from [name] for evaluation of chest pain and shortness of breath.  Patient reports he woke up this morning with pain in her chest.  She described as a pressure sensation with some mild shortness of breath lasting for several hours.  Pain has since completely dissipated and she is back to her normal self.  When the pain was intense she did call and notify the nursing staff who brought patient here.  No specific treatment tried.  She does not endorse any lightheadedness dizziness nausea or diaphoresis with this episode.  No recent heavy lifting or strength activity.  She does not have any other complaint.  Patient however is a poor historian due to her age. Physical Exam  BP (!) 142/63   Pulse 60   Temp 97.7 F (36.5 C) (Oral)   Resp 15   SpO2 98%   Physical Exam Vitals and nursing note reviewed.  Constitutional:      General: She is not in acute distress.    Appearance: She is well-developed.  HENT:     Head: Normocephalic and atraumatic.     Right Ear: External ear normal.     Left Ear: External ear normal.  Eyes:     General: No scleral icterus.       Right eye: No discharge.        Left eye: No discharge.     Conjunctiva/sclera: Conjunctivae normal.  Neck:     Trachea: No tracheal deviation.  Cardiovascular:     Rate and Rhythm: Normal rate.  Pulmonary:     Effort: Pulmonary effort is normal. No respiratory distress.     Breath sounds: No stridor.  Abdominal:     General: There is no distension.  Musculoskeletal:        General: No swelling or deformity.     Cervical back: Neck supple.   Skin:    General: Skin is warm and dry.     Findings: No rash.  Neurological:     Mental Status: She is alert.     Cranial Nerves: Cranial nerve deficit: no gross deficits.   Procedures  Procedures  ED Course / MDM    Medical Decision Making Amount and/or Complexity of Data Reviewed Labs: ordered. Radiology: ordered.  Risk Prescription drug management.  Patient is a 88 year old female whose care was transferred to me at shift change from [name].  Please see his note for additional information.  In summary, patient has a history of hypertension, anxiety, GERD, kidney disease, cardiac disease, who was brought via EMS from [name] for evaluation of chest pain and shortness of breath.  She woke up this morning with pain in her chest.  She described as a pressure sensation with some mild shortness of breath lasting for several hours.  At the time of arrival her pain had completely resolved and she was back to her baseline.  She denied any initial lightheadedness, dizziness, nausea, diaphoresis.  Patient's lab work had mostly resulted.  CBC with a hemoglobin of 11.7 and RDW of 17.9.  BMP with a CO2 of 21, glucose of 142,  creatinine 1.14, GFR 45.  Kidney function appears similar to the patient's baseline.  Age-adjusted D-dimer of 0.7 appears to be within normal limits.  BNP elevated above patient's baseline at 356.9.  Initial troponin of 13.  At the time of shift change patient pending second troponin as well as reassessment after being given 40 mg of IV Lasix.  Repeat troponin is since resulted and is flat at 13.  On reassessment after being given IV Lasix she now has no complaints.  Denies any chest pain or shortness of breath.  States that she is eager to be discharged home.  Feel that this is reasonable.  She was able to ambulate without difficulty with staff.  Patient given strict return precautions.  We will contact [facility] for transport back to her facility.      Rayna Sexton, PA-C 11/26/21 1825    Sherwood Gambler, MD 11/27/21 [phone]

## 2021-11-26 NOTE — ED Notes (Signed)
[place] sandwhich provided.

## 2021-11-26 NOTE — ED Notes (Signed)
Pt ambulated with assistance to the bathroom on arrival.  PT appeared a little winded while walking to toilet, then back to bed which had been moved outside toilet for convenience.  PT uses a walker at home.

## 2021-11-26 NOTE — ED Provider Notes (Signed)
[facility] EMERGENCY DEPARTMENT Provider Note   CSN: [id number] Arrival date & time: 11/26/21  1250     History  No chief complaint on file.   [name] is a 88 y.o. female.  The history is provided by the patient and medical records. No language interpreter was used.   88 year old female with significant history of hypertension, anxiety, GERD, kidney disease, cardiac disease brought here via EMS from [facility] for evaluation of chest pain and shortness of breath.  Patient reports he woke up this morning with pain in her chest.  She described as a pressure sensation with some mild shortness of breath lasting for several hours.  Pain has since completely dissipated and she is back to her normal self.  When the pain was intense she did call and notify the nursing staff who brought patient here.  No specific treatment tried.  She does not endorse any lightheadedness dizziness nausea or diaphoresis with this episode.  No recent heavy lifting or strength activity.  She does not have any other complaint.  Patient however is a poor historian due to her age.  Home Medications Prior to Admission medications   Medication Sig Start Date End Date Taking? Authorizing Provider  acetaminophen (TYLENOL) 500 MG tablet Take 1,000 mg by mouth every 6 (six) hours as needed for headache (pain).    [provider]  amiodarone (PACERONE) 100 MG tablet Take 1 tablet (100 mg total) by mouth daily. 09/03/19   Dixie Dials, MD  amLODipine (NORVASC) 5 MG tablet Take 1 tablet (5 mg total) by mouth daily. 05/19/19   Domenic Polite, MD  atorvastatin (LIPITOR) 40 MG tablet Take 1 tablet (40 mg total) by mouth daily at 6 PM. 09/02/19   Dixie Dials, MD  clopidogrel (PLAVIX) 75 MG tablet Take 1 tablet (75 mg total) by mouth daily. 09/02/19   Dixie Dials, MD  diclofenac sodium (VOLTAREN) 1 % GEL APPLY 2-4 GRAMS TO AFFECTED AREA 2-3 TIMES A DAY AS NEEDED. Patient taking differently:  Apply 2-4 g topically 2 (two) times daily.  04/30/19   Meredith Pel, MD  esomeprazole (NEXIUM) 40 MG capsule Take 40 mg by mouth daily before breakfast.     [provider]  furosemide (LASIX) 40 MG tablet Take 40 mg by mouth daily. 03/04/19   [provider]  isosorbide mononitrate (IMDUR) 30 MG 24 hr tablet Take 1 tablet (30 mg total) by mouth daily. 09/02/19   Dixie Dials, MD  levothyroxine (SYNTHROID) 50 MCG tablet Take 50 mcg by mouth daily. 03/04/19   [provider]  losartan (COZAAR) 25 MG tablet Take 1 tablet (25 mg total) by mouth daily. 09/03/19   Dixie Dials, MD  Melatonin 3 MG TABS Take 3 mg by mouth at bedtime as needed (sleep).     [provider]  metoprolol tartrate (LOPRESSOR) 50 MG tablet Take 0.5 tablets (25 mg total) by mouth 2 (two) times daily. 09/02/19   Dixie Dials, MD  mupirocin ointment (BACTROBAN) 2 % Place 1 application into the nose 2 (two) times daily. 09/02/19   Dixie Dials, MD  nitroGLYCERIN (NITROSTAT) 0.4 MG SL tablet Place 1 tablet (0.4 mg total) under the tongue every 5 (five) minutes x 3 doses as needed for chest pain. 09/02/19   Dixie Dials, MD  Polyethyl Glycol-Propyl Glycol (SYSTANE) 0.4-0.3 % GEL ophthalmic gel Place 1 application into both eyes at bedtime.    [provider]  Polyethyl Glycol-Propyl Glycol (SYSTANE) 0.4-0.3 %  SOLN Place 1 drop into both eyes every 2 (two) hours.    [provider]  potassium chloride SA (KLOR-CON M20) 20 MEQ tablet Take 1 tablet (20 mEq total) by mouth daily. 10/16/13   Noralee Space, MD      Allergies    Codeine and Pregabalin    Review of Systems   Review of Systems  All other systems reviewed and are negative.  Physical Exam Updated Vital Signs BP (!) 133/55   Pulse 62   Temp 97.7 F (36.5 C) (Oral)   Resp 16   SpO2 100%  Physical Exam Vitals and nursing note reviewed.  Constitutional:      General: She is not in acute distress.    Appearance:  She is well-developed.  HENT:     Head: Atraumatic.  Eyes:     Conjunctiva/sclera: Conjunctivae normal.  Cardiovascular:     Rate and Rhythm: Normal rate and regular rhythm.     Pulses: Normal pulses.     Heart sounds: Murmur heard.  Pulmonary:     Effort: Pulmonary effort is normal.     Breath sounds: Rales present. No wheezing or rhonchi.  Abdominal:     Palpations: Abdomen is soft.     Tenderness: There is no abdominal tenderness.  Musculoskeletal:     Cervical back: Neck supple.     Comments: Trace edema to bilateral lower extremities  Skin:    Findings: No rash.  Neurological:     Mental Status: She is alert. Mental status is at baseline.  Psychiatric:        Mood and Affect: Mood normal.    ED Results / Procedures / Treatments   Labs (all labs ordered are listed, but only abnormal results are displayed) Labs Reviewed  BASIC METABOLIC PANEL - Abnormal; Notable for the following components:      Result Value   CO2 21 (*)    Glucose, Bld 142 (*)    Creatinine, Ser 1.14 (*)    GFR, Estimated 45 (*)    All other components within normal limits  CBC - Abnormal; Notable for the following components:   Hemoglobin 11.7 (*)    RDW 17.9 (*)    All other components within normal limits  D-DIMER, QUANTITATIVE - Abnormal; Notable for the following components:   D-Dimer, Quant 0.70 (*)    All other components within normal limits  BRAIN NATRIURETIC PEPTIDE - Abnormal; Notable for the following components:   B Natriuretic Peptide 356.9 (*)    All other components within normal limits  TROPONIN I (HIGH SENSITIVITY)  TROPONIN I (HIGH SENSITIVITY)    EKG None ED ECG REPORT   Date: 11/26/2021  Rate: 63  Rhythm: normal sinus rhythm  QRS Axis: left  Intervals: PR prolonged and QT prolonged  ST/T Wave abnormalities: nonspecific ST changes  Conduction Disutrbances:left bundle branch block  Narrative Interpretation:   Old EKG Reviewed: unchanged  I have personally  reviewed the EKG tracing and agree with the computerized printout as noted.   Radiology DG Chest Port 1 View  Result Date: 11/26/2021 CLINICAL DATA:  Chest pain EXAM: PORTABLE CHEST 1 VIEW COMPARISON:  Chest x-ray dated Nov 12, 2021 FINDINGS: Cardiac and mediastinal contours are unchanged. Mild bibasilar opacities, likely due to atelectasis. No large pleural effusion or pneumothorax. IMPRESSION: Mild bibasilar opacities, likely due to atelectasis. Electronically Signed   By: Yetta Glassman M.D.   On: 11/26/2021 13:46    Procedures Procedures    Medications  Ordered in ED Medications  furosemide (LASIX) injection 40 mg (has no administration in time range)    ED Course/ Medical Decision Making/ A&P                           Medical Decision Making Amount and/or Complexity of Data Reviewed Labs: ordered. Radiology: ordered.  Risk Prescription drug management.   BP (!) 133/55   Pulse 62   Temp 97.7 F (36.5 C) (Oral)   Resp 16   SpO2 100%   1:31 PM This is a 88 year old female with significant medical history including congestive heart failure hypertension GERD TIA who is here for evaluation of chest pain.  She developed chest pressure sensation to her mid chest that started earlier this morning with mild shortness of breath.  Symptoms did wake her up but states that her symptom has been completely resolved without any specific treatment.  She is currently resting comfortably appears to be in no acute discomfort.  Patient does not appear to be fluid overload and no hypoxia noted on exam.  She is a poor historian due to her age.  Work-up initiated.  2:41 PM Labs, EKG, and imaging independently reviewed and interpreted by me and I agree with radiologist interpretation.  Creatinine shows creatinine of 1.14 which is similar to prior.  Normal WBC, hemoglobin stable, initial troponin is normal.  Patient does have a mildly elevated D-dimer of 0.7 however after age-adjusted, this is  within normal range therefore have low suspicion for PE and will not obtain a chest CTA at this time.  BMP is currently pending.  Portable chest x-ray obtained shows mild bibasilar opacities, likely due to atelectasis.  Patient does not have any fever or productive cough to suggest pneumonia.  3:21 PM BNP is elevated at 356.9.  Patient does appears to be mildly fluid overload and I suspect that may contribute to her ongoing symptoms.  We will give 40 mg of Lasix IV and will await for Delta Troponin, if it remains flat then patient can be discharged back to facility with outpatient follow-up.  I do not anticipate patient being admitted for further cardiac work-up given her age and low likelihood of heart catheterization or invasive cardiac procedure.  I discussed With Dr. Dina Rich.  3:27 PM Pt sign out to oncoming team who will f/u on delta trop, and dispo as appropriate   This patient presents to the ED for concern of cp, this involves an extensive number of treatment options, and is a complaint that carries with it a high risk of complications and morbidity.  The differential diagnosis includes ACS, PE, CHF, PNA, GERD, gastritis  Co morbidities that complicate the patient evaluation CHF  GERD  HTN Additional history obtained:  Additional history obtained from EMS External records from outside source obtained and reviewed including notes from PCP  Lab Tests:  I Ordered, and personally interpreted labs.  The pertinent results include:  as above  Imaging Studies ordered:  I ordered imaging studies including CXR I independently visualized and interpreted imaging which showed mild bibasilar opacities, likely atelectasis I agree with the radiologist interpretation  Cardiac Monitoring:  The patient was maintained on a cardiac monitor.  I personally viewed and interpreted the cardiac monitored which showed an underlying rhythm of: NSR  Medicines ordered and prescription drug management:  I  ordered medication including lasix  for CHF exacerbation Reevaluation of the patient after these medicines showed that the patient improved  I have reviewed the patients home medicines and have made adjustments as needed  Test Considered: as above  Critical Interventions: IV lasix   Problem List / ED Course: cp  sob  Reevaluation:  After the interventions noted above, I reevaluated the patient and found that they have :improved  Social Determinants of Health: age  Dispostion:  After consideration of the diagnostic results and the patients response to treatment, I feel that the patent would benefit from outpt f/u.         Final Clinical Impression(s) / ED Diagnoses Final diagnoses:  Acute on chronic congestive heart failure, unspecified heart failure type [facility])    Rx / DC Orders ED Discharge Orders     None         Domenic Moras, PA-C 12/03/21 2140    Horton, Alvin Critchley, DO 12/06/21 [phone]

## 2021-11-26 NOTE — ED Notes (Signed)
Daughter Deetta Perla [phone] would like an update and thinks her mom had a panic attack

## 2021-11-29 ENCOUNTER — Inpatient Hospital Stay (HOSPITAL_COMMUNITY)
Admission: EM | Admit: 2021-11-29 | Discharge: 2021-12-02 | DRG: 291 | Disposition: A | Discharge status: Skilled Nursing Facility | Payer: Medicare Other | Attending: Cardiovascular Disease | Admitting: Cardiovascular Disease

## 2021-11-29 ENCOUNTER — Encounter (HOSPITAL_COMMUNITY): Payer: Self-pay | Admitting: Emergency Medicine

## 2021-11-29 ENCOUNTER — Emergency Department (HOSPITAL_COMMUNITY): Payer: Medicare Other

## 2021-11-29 DIAGNOSIS — N182 Chronic kidney disease, stage 2 (mild): Secondary | ICD-10-CM | POA: Diagnosis present

## 2021-11-29 DIAGNOSIS — I110 Hypertensive heart disease with heart failure: Secondary | ICD-10-CM | POA: Diagnosis not present

## 2021-11-29 DIAGNOSIS — R079 Chest pain, unspecified: Principal | ICD-10-CM

## 2021-11-29 DIAGNOSIS — I5031 Acute diastolic (congestive) heart failure: Secondary | ICD-10-CM

## 2021-11-29 DIAGNOSIS — Z8261 Family history of arthritis: Secondary | ICD-10-CM | POA: Diagnosis not present

## 2021-11-29 DIAGNOSIS — M8580 Other specified disorders of bone density and structure, unspecified site: Secondary | ICD-10-CM | POA: Diagnosis present

## 2021-11-29 DIAGNOSIS — Z79890 Hormone replacement therapy (postmenopausal): Secondary | ICD-10-CM | POA: Diagnosis not present

## 2021-11-29 DIAGNOSIS — R001 Bradycardia, unspecified: Secondary | ICD-10-CM | POA: Diagnosis not present

## 2021-11-29 DIAGNOSIS — Z96652 Presence of left artificial knee joint: Secondary | ICD-10-CM | POA: Diagnosis present

## 2021-11-29 DIAGNOSIS — I739 Peripheral vascular disease, unspecified: Secondary | ICD-10-CM | POA: Diagnosis present

## 2021-11-29 DIAGNOSIS — I342 Nonrheumatic mitral (valve) stenosis: Secondary | ICD-10-CM | POA: Diagnosis not present

## 2021-11-29 DIAGNOSIS — I509 Heart failure, unspecified: Secondary | ICD-10-CM | POA: Diagnosis not present

## 2021-11-29 DIAGNOSIS — Z8249 Family history of ischemic heart disease and other diseases of the circulatory system: Secondary | ICD-10-CM

## 2021-11-29 DIAGNOSIS — M1990 Unspecified osteoarthritis, unspecified site: Secondary | ICD-10-CM | POA: Diagnosis present

## 2021-11-29 DIAGNOSIS — E785 Hyperlipidemia, unspecified: Secondary | ICD-10-CM | POA: Diagnosis present

## 2021-11-29 DIAGNOSIS — M545 Low back pain, unspecified: Secondary | ICD-10-CM | POA: Diagnosis present

## 2021-11-29 DIAGNOSIS — I130 Hypertensive heart and chronic kidney disease with heart failure and stage 1 through stage 4 chronic kidney disease, or unspecified chronic kidney disease: Secondary | ICD-10-CM | POA: Diagnosis not present

## 2021-11-29 DIAGNOSIS — Z853 Personal history of malignant neoplasm of breast: Secondary | ICD-10-CM

## 2021-11-29 DIAGNOSIS — Z96642 Presence of left artificial hip joint: Secondary | ICD-10-CM | POA: Diagnosis present

## 2021-11-29 DIAGNOSIS — Z9011 Acquired absence of right breast and nipple: Secondary | ICD-10-CM

## 2021-11-29 DIAGNOSIS — R0789 Other chest pain: Secondary | ICD-10-CM | POA: Diagnosis not present

## 2021-11-29 DIAGNOSIS — Z7902 Long term (current) use of antithrombotics/antiplatelets: Secondary | ICD-10-CM | POA: Diagnosis not present

## 2021-11-29 DIAGNOSIS — I5033 Acute on chronic diastolic (congestive) heart failure: Secondary | ICD-10-CM | POA: Diagnosis present

## 2021-11-29 DIAGNOSIS — R9431 Abnormal electrocardiogram [ECG] [EKG]: Secondary | ICD-10-CM | POA: Diagnosis not present

## 2021-11-29 DIAGNOSIS — E039 Hypothyroidism, unspecified: Secondary | ICD-10-CM | POA: Diagnosis not present

## 2021-11-29 DIAGNOSIS — Z8673 Personal history of transient ischemic attack (TIA), and cerebral infarction without residual deficits: Secondary | ICD-10-CM

## 2021-11-29 DIAGNOSIS — Z79899 Other long term (current) drug therapy: Secondary | ICD-10-CM | POA: Diagnosis not present

## 2021-11-29 DIAGNOSIS — K219 Gastro-esophageal reflux disease without esophagitis: Secondary | ICD-10-CM | POA: Diagnosis not present

## 2021-11-29 DIAGNOSIS — Z85038 Personal history of other malignant neoplasm of large intestine: Secondary | ICD-10-CM | POA: Diagnosis not present

## 2021-11-29 DIAGNOSIS — K589 Irritable bowel syndrome without diarrhea: Secondary | ICD-10-CM | POA: Diagnosis present

## 2021-11-29 DIAGNOSIS — Z885 Allergy status to narcotic agent status: Secondary | ICD-10-CM

## 2021-11-29 DIAGNOSIS — I480 Paroxysmal atrial fibrillation: Secondary | ICD-10-CM | POA: Diagnosis present

## 2021-11-29 DIAGNOSIS — I959 Hypotension, unspecified: Secondary | ICD-10-CM | POA: Diagnosis not present

## 2021-11-29 DIAGNOSIS — Z888 Allergy status to other drugs, medicaments and biological substances status: Secondary | ICD-10-CM

## 2021-11-29 DIAGNOSIS — I447 Left bundle-branch block, unspecified: Secondary | ICD-10-CM | POA: Diagnosis present

## 2021-11-29 DIAGNOSIS — I421 Obstructive hypertrophic cardiomyopathy: Secondary | ICD-10-CM | POA: Diagnosis not present

## 2021-11-29 DIAGNOSIS — Z85048 Personal history of other malignant neoplasm of rectum, rectosigmoid junction, and anus: Secondary | ICD-10-CM

## 2021-11-29 LAB — CBC WITH DIFFERENTIAL/PLATELET
Abs Immature Granulocytes: 0.09 10*3/uL — ABNORMAL HIGH (ref 0.00–0.07)
Basophils Absolute: 0.1 10*3/uL (ref 0.0–0.1)
Basophils Relative: 1 %
Eosinophils Absolute: 0.1 10*3/uL (ref 0.0–0.5)
Eosinophils Relative: 1 %
HCT: 36.2 % (ref 36.0–46.0)
Hemoglobin: 11 g/dL — ABNORMAL LOW (ref 12.0–15.0)
Immature Granulocytes: 1 %
Lymphocytes Relative: 25 %
Lymphs Abs: 2 10*3/uL (ref 0.7–4.0)
MCH: 27.4 pg (ref 26.0–34.0)
MCHC: 30.4 g/dL (ref 30.0–36.0)
MCV: 90 fL (ref 80.0–100.0)
Monocytes Absolute: 1.2 10*3/uL — ABNORMAL HIGH (ref 0.1–1.0)
Monocytes Relative: 15 %
Neutro Abs: 4.6 10*3/uL (ref 1.7–7.7)
Neutrophils Relative %: 57 %
Platelets: 209 10*3/uL (ref 150–400)
RBC: 4.02 MIL/uL (ref 3.87–5.11)
RDW: 17.7 % — ABNORMAL HIGH (ref 11.5–15.5)
WBC: 8.1 10*3/uL (ref 4.0–10.5)
nRBC: 0 % (ref 0.0–0.2)

## 2021-11-29 LAB — BRAIN NATRIURETIC PEPTIDE: B Natriuretic Peptide: 534.4 pg/mL — ABNORMAL HIGH (ref 0.0–100.0)

## 2021-11-29 LAB — TROPONIN I (HIGH SENSITIVITY)
Troponin I (High Sensitivity): 16 ng/L (ref <18)
Troponin I (High Sensitivity): 18 ng/L — ABNORMAL HIGH (ref <18)

## 2021-11-29 LAB — COMPREHENSIVE METABOLIC PANEL
ALT: 13 U/L (ref 0–44)
AST: 19 U/L (ref 15–41)
Albumin: 3.4 g/dL — ABNORMAL LOW (ref 3.5–5.0)
Alkaline Phosphatase: 47 U/L (ref 38–126)
Anion gap: 7 (ref 5–15)
BUN: 18 mg/dL (ref 8–23)
CO2: 24 mmol/L (ref 22–32)
Calcium: 9.1 mg/dL (ref 8.9–10.3)
Chloride: 106 mmol/L (ref 98–111)
Creatinine, Ser: 1.21 mg/dL — ABNORMAL HIGH (ref 0.44–1.00)
GFR, Estimated: 42 mL/min — ABNORMAL LOW (ref >60)
Glucose, Bld: 110 mg/dL — ABNORMAL HIGH (ref 70–99)
Potassium: 3.9 mmol/L (ref 3.5–5.1)
Sodium: 137 mmol/L (ref 135–145)
Total Bilirubin: 0.7 mg/dL (ref 0.3–1.2)
Total Protein: 6 g/dL — ABNORMAL LOW (ref 6.5–8.1)

## 2021-11-29 LAB — LIPASE, BLOOD: Lipase: 30 U/L (ref 11–51)

## 2021-11-29 MED ORDER — AMIODARONE HCL 200 MG PO TABS
100.0000 mg | ORAL_TABLET | Freq: Every day | ORAL | Status: DC
  Administered 2021-11-30 – 2021-12-02 (×3): 100 mg via ORAL
  Filled 2021-11-29 (×3): qty 1

## 2021-11-29 MED ORDER — LOSARTAN POTASSIUM 50 MG PO TABS
25.0000 mg | ORAL_TABLET | Freq: Every day | ORAL | Status: DC
  Administered 2021-11-29 – 2021-12-02 (×4): 25 mg via ORAL
  Filled 2021-11-29 (×4): qty 1

## 2021-11-29 MED ORDER — SODIUM CHLORIDE 0.9% FLUSH
3.0000 mL | Freq: Two times a day (BID) | INTRAVENOUS | Status: DC
Start: 2021-11-29 — End: 2021-12-02
  Administered 2021-11-29 – 2021-12-02 (×6): 3 mL via INTRAVENOUS

## 2021-11-29 MED ORDER — POTASSIUM CHLORIDE CRYS ER 20 MEQ PO TBCR
20.0000 meq | EXTENDED_RELEASE_TABLET | Freq: Every day | ORAL | Status: DC
  Administered 2021-11-30 – 2021-12-02 (×3): 20 meq via ORAL
  Filled 2021-11-29 (×3): qty 1

## 2021-11-29 MED ORDER — SODIUM CHLORIDE 0.9% FLUSH: 3.0000 mL | INTRAVENOUS | Status: DC | PRN

## 2021-11-29 MED ORDER — CLOPIDOGREL BISULFATE 75 MG PO TABS
75.0000 mg | ORAL_TABLET | Freq: Every day | ORAL | Status: DC
  Administered 2021-11-30 – 2021-12-02 (×3): 75 mg via ORAL
  Filled 2021-11-29 (×3): qty 1

## 2021-11-29 MED ORDER — ACETAMINOPHEN 325 MG PO TABS
650.0000 mg | ORAL_TABLET | ORAL | Status: DC | PRN
  Administered 2021-11-29 – 2021-12-02 (×4): 650 mg via ORAL
  Filled 2021-11-29 (×4): qty 2

## 2021-11-29 MED ORDER — ATORVASTATIN CALCIUM 40 MG PO TABS
40.0000 mg | ORAL_TABLET | Freq: Every day | ORAL | Status: DC
  Administered 2021-11-29 – 2021-12-01 (×3): 40 mg via ORAL
  Filled 2021-11-29 (×3): qty 1

## 2021-11-29 MED ORDER — POLYETHYL GLYCOL-PROPYL GLYCOL 0.4-0.3 % OP SOLN: 1.0000 [drp] | OPHTHALMIC | Status: DC

## 2021-11-29 MED ORDER — AMLODIPINE BESYLATE 5 MG PO TABS
5.0000 mg | ORAL_TABLET | Freq: Every day | ORAL | Status: DC
Start: 2021-11-29 — End: 2021-12-02
  Administered 2021-11-29 – 2021-12-02 (×3): 5 mg via ORAL
  Filled 2021-11-29 (×4): qty 1

## 2021-11-29 MED ORDER — POLYVINYL ALCOHOL 1.4 % OP SOLN: 1.0000 [drp] | OPHTHALMIC | Status: DC | PRN

## 2021-11-29 MED ORDER — METOPROLOL TARTRATE 25 MG PO TABS
25.0000 mg | ORAL_TABLET | Freq: Two times a day (BID) | ORAL | Status: DC
  Administered 2021-11-29 – 2021-11-30 (×3): 25 mg via ORAL
  Filled 2021-11-29 (×4): qty 1

## 2021-11-29 MED ORDER — FUROSEMIDE 10 MG/ML IJ SOLN
40.0000 mg | Freq: Once | INTRAMUSCULAR | Status: AC
  Administered 2021-11-29: 40 mg via INTRAVENOUS
  Filled 2021-11-29: qty 4

## 2021-11-29 MED ORDER — POLYETHYL GLYCOL-PROPYL GLYCOL 0.4-0.3 % OP GEL
1.0000 | Freq: Every day | OPHTHALMIC | Status: DC
Start: 2021-11-29 — End: 2021-11-29

## 2021-11-29 MED ORDER — LEVOTHYROXINE SODIUM 25 MCG PO TABS
50.0000 ug | ORAL_TABLET | Freq: Every day | ORAL | Status: DC
  Administered 2021-11-30 – 2021-12-02 (×3): 50 ug via ORAL
  Filled 2021-11-29 (×3): qty 2

## 2021-11-29 MED ORDER — ISOSORBIDE MONONITRATE ER 30 MG PO TB24
30.0000 mg | ORAL_TABLET | Freq: Every day | ORAL | Status: DC
  Administered 2021-11-29 – 2021-12-02 (×4): 30 mg via ORAL
  Filled 2021-11-29 (×4): qty 1

## 2021-11-29 MED ORDER — MELATONIN 3 MG PO TABS
3.0000 mg | ORAL_TABLET | Freq: Every evening | ORAL | Status: DC | PRN
  Administered 2021-11-29 – 2021-12-01 (×3): 3 mg via ORAL
  Filled 2021-11-29 (×3): qty 1

## 2021-11-29 MED ORDER — PANTOPRAZOLE SODIUM 40 MG PO TBEC
40.0000 mg | DELAYED_RELEASE_TABLET | Freq: Every day | ORAL | Status: DC
  Administered 2021-11-29 – 2021-12-02 (×4): 40 mg via ORAL
  Filled 2021-11-29 (×4): qty 1

## 2021-11-29 MED ORDER — DICLOFENAC SODIUM 1 % TD GEL: 2.0000 g | Freq: Two times a day (BID) | TRANSDERMAL | Status: DC

## 2021-11-29 MED ORDER — ONDANSETRON HCL 4 MG/2ML IJ SOLN: 4.0000 mg | Freq: Four times a day (QID) | INTRAMUSCULAR | Status: DC | PRN

## 2021-11-29 MED ORDER — DICLOFENAC SODIUM 1 % EX GEL
2.0000 g | Freq: Two times a day (BID) | CUTANEOUS | Status: DC
  Administered 2021-11-29 – 2021-12-02 (×6): 2 g via TOPICAL
  Filled 2021-11-29: qty 100

## 2021-11-29 MED ORDER — SODIUM CHLORIDE 0.9 % IV SOLN: 250.0000 mL | INTRAVENOUS | Status: DC | PRN
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
## 2021-11-29 NOTE — ED Triage Notes (Signed)
Patient BIB [facility] for evaluation of chest pain that started earlier today with radiation into left arm. Patient is alert, speaking in complete sentences, and is in no apparent distress at this time.

## 2021-11-30 ENCOUNTER — Inpatient Hospital Stay (HOSPITAL_BASED_OUTPATIENT_CLINIC_OR_DEPARTMENT_OTHER): Payer: Medicare Other

## 2021-11-30 ENCOUNTER — Inpatient Hospital Stay (HOSPITAL_COMMUNITY): Payer: Medicare Other

## 2021-11-30 DIAGNOSIS — I480 Paroxysmal atrial fibrillation: Secondary | ICD-10-CM | POA: Diagnosis not present

## 2021-11-30 DIAGNOSIS — I5033 Acute on chronic diastolic (congestive) heart failure: Secondary | ICD-10-CM | POA: Diagnosis not present

## 2021-11-30 DIAGNOSIS — I342 Nonrheumatic mitral (valve) stenosis: Secondary | ICD-10-CM | POA: Diagnosis not present

## 2021-11-30 DIAGNOSIS — Z85048 Personal history of other malignant neoplasm of rectum, rectosigmoid junction, and anus: Secondary | ICD-10-CM | POA: Diagnosis not present

## 2021-11-30 DIAGNOSIS — Z853 Personal history of malignant neoplasm of breast: Secondary | ICD-10-CM | POA: Diagnosis not present

## 2021-11-30 DIAGNOSIS — I5031 Acute diastolic (congestive) heart failure: Secondary | ICD-10-CM

## 2021-11-30 DIAGNOSIS — I130 Hypertensive heart and chronic kidney disease with heart failure and stage 1 through stage 4 chronic kidney disease, or unspecified chronic kidney disease: Secondary | ICD-10-CM | POA: Diagnosis not present

## 2021-11-30 DIAGNOSIS — R079 Chest pain, unspecified: Secondary | ICD-10-CM | POA: Diagnosis not present

## 2021-11-30 DIAGNOSIS — K219 Gastro-esophageal reflux disease without esophagitis: Secondary | ICD-10-CM | POA: Diagnosis not present

## 2021-11-30 DIAGNOSIS — M1990 Unspecified osteoarthritis, unspecified site: Secondary | ICD-10-CM | POA: Diagnosis not present

## 2021-11-30 DIAGNOSIS — R001 Bradycardia, unspecified: Secondary | ICD-10-CM | POA: Diagnosis not present

## 2021-11-30 DIAGNOSIS — I421 Obstructive hypertrophic cardiomyopathy: Secondary | ICD-10-CM | POA: Diagnosis not present

## 2021-11-30 DIAGNOSIS — E785 Hyperlipidemia, unspecified: Secondary | ICD-10-CM | POA: Diagnosis not present

## 2021-11-30 DIAGNOSIS — E039 Hypothyroidism, unspecified: Secondary | ICD-10-CM | POA: Diagnosis not present

## 2021-11-30 DIAGNOSIS — N182 Chronic kidney disease, stage 2 (mild): Secondary | ICD-10-CM | POA: Diagnosis not present

## 2021-11-30 DIAGNOSIS — I739 Peripheral vascular disease, unspecified: Secondary | ICD-10-CM | POA: Diagnosis not present

## 2021-11-30 DIAGNOSIS — Z85038 Personal history of other malignant neoplasm of large intestine: Secondary | ICD-10-CM | POA: Diagnosis not present

## 2021-11-30 LAB — BASIC METABOLIC PANEL
Anion gap: 7 (ref 5–15)
BUN: 19 mg/dL (ref 8–23)
CO2: 27 mmol/L (ref 22–32)
Calcium: 9.3 mg/dL (ref 8.9–10.3)
Chloride: 103 mmol/L (ref 98–111)
Creatinine, Ser: 1.02 mg/dL — ABNORMAL HIGH (ref 0.44–1.00)
GFR, Estimated: 51 mL/min — ABNORMAL LOW (ref >60)
Glucose, Bld: 105 mg/dL — ABNORMAL HIGH (ref 70–99)
Potassium: 3.6 mmol/L (ref 3.5–5.1)
Sodium: 137 mmol/L (ref 135–145)

## 2021-11-30 LAB — ECHOCARDIOGRAM COMPLETE
Area-P 1/2: 1.83 cm2
Calc EF: 67.8 %
Height: 66 in
MV M vel: 7.68 m/s
MV Peak grad: 235.9 mmHg
Radius: 0.3 cm
S' Lateral: 2.1 cm
Single Plane A2C EF: 62.4 %
Single Plane A4C EF: 70.6 %
Weight: 2529.12 oz

## 2021-11-30 NOTE — Progress Notes (Signed)
Mobility Specialist Progress Note    11/30/21 1457  Mobility  Activity Ambulated with assistance in hallway  Level of Assistance Contact guard assist, steadying assist  Assistive Device Front wheel walker  Distance Ambulated (ft) 100 ft  Activity Response Tolerated well  $Mobility charge 1 Mobility   Pre-Mobility: 47 HR, 98% SpO2 Post-Mobility: 50 HR, 99% SpO2  Pt received in bed and agreeable. No complaints on walk. Returned to chair with call bell in reach.    Hildred Alamin Mobility Specialist  Primary: 5N M.S. Phone: [phone] Secondary: 6N M.S. Phone: [phone]

## 2021-11-30 NOTE — Care Management Obs Status (Signed)
[facility] NOTIFICATION   Patient Details  Name: LUCRESHA DISMUKE MRN: [id number] Date of Birth: 1924-09-13   Medicare Observation Status Notification Given:  Yes    Bethena Roys, RN 11/30/2021, 5:10 PM
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
## 2021-11-30 NOTE — Progress Notes (Signed)
Ref: [name], MD   Subjective:  Feeling better. VS stable. Moderate LVH, grade 2 diastolic dysfunction and 20 + mm gradient at LV outflow tract. HR in 50-60/min  Objective:  Vital Signs in the last 24 hours: Temp:  [97.4 F (36.3 C)-98.3 F (36.8 C)] 97.5 F (36.4 C) (05/30 0831) Pulse Rate:  [52-63] 60 (05/30 0831) Cardiac Rhythm: Sinus bradycardia (05/30 0831) Resp:  [12-19] 15 (05/30 0831) BP: (127-154)/(47-71) 135/64 (05/30 0831) SpO2:  [92 %-99 %] 97 % (05/30 0831) Weight:  [71.7 kg] 71.7 kg (05/30 0509)  Physical Exam: BP Readings from Last 1 Encounters:  11/30/21 135/64     Wt Readings from Last 1 Encounters:  11/30/21 71.7 kg    Weight change:  Body mass index is 25.51 kg/m. HEENT: [place]/AT, Eyes-Blue, Conjunctiva-Pink, Sclera-Non-icteric Neck: No JVD, No bruit, Trachea midline. Lungs:  Clearing, Bilateral. Cardiac:  Regular rhythm, normal S1 and S2, no S3. II/VI systolic murmur. Abdomen:  Soft, non-tender. BS present. Extremities:  Trace edema present. No cyanosis. No clubbing. CNS: AxOx2, Cranial nerves grossly intact, moves all 4 extremities.  Skin: Warm and dry.   Intake/Output from previous day: 05/29 0701 - 05/30 0700 In: -  Out: 700 [Urine:700]    Lab Results: BMET    Component Value Date/Time   NA 137 11/30/2021 0703   NA 137 11/29/2021 1420   NA 136 11/26/2021 1319   K 3.6 11/30/2021 0703   K 3.9 11/29/2021 1420   K 3.7 11/26/2021 1319   CL 103 11/30/2021 0703   CL 106 11/29/2021 1420   CL 103 11/26/2021 1319   CO2 27 11/30/2021 0703   CO2 24 11/29/2021 1420   CO2 21 (L) 11/26/2021 1319   GLUCOSE 105 (H) 11/30/2021 0703   GLUCOSE 110 (H) 11/29/2021 1420   GLUCOSE 142 (H) 11/26/2021 1319   BUN 19 11/30/2021 0703   BUN 18 11/29/2021 1420   BUN 19 11/26/2021 1319   CREATININE 1.02 (H) 11/30/2021 0703   CREATININE 1.21 (H) 11/29/2021 1420   CREATININE 1.14 (H) 11/26/2021 1319   CALCIUM 9.3 11/30/2021 0703   CALCIUM 9.1 11/29/2021  1420   CALCIUM 9.4 11/26/2021 1319   GFRNONAA 51 (L) 11/30/2021 0703   GFRNONAA 42 (L) 11/29/2021 1420   GFRNONAA 45 (L) 11/26/2021 1319   GFRAA >60 08/31/2019 0435   GFRAA 53 (L) 08/30/2019 0955   GFRAA >60 05/19/2019 0323   CBC    Component Value Date/Time   WBC 8.1 11/29/2021 1420   RBC 4.02 11/29/2021 1420   HGB 11.0 (L) 11/29/2021 1420   HCT 36.2 11/29/2021 1420   PLT 209 11/29/2021 1420   MCV 90.0 11/29/2021 1420   MCH 27.4 11/29/2021 1420   MCHC 30.4 11/29/2021 1420   RDW 17.7 (H) 11/29/2021 1420   LYMPHSABS 2.0 11/29/2021 1420   MONOABS 1.2 (H) 11/29/2021 1420   EOSABS 0.1 11/29/2021 1420   BASOSABS 0.1 11/29/2021 1420   HEPATIC Function Panel Recent Labs    11/29/21 1420  PROT 6.0*   HEMOGLOBIN A1C No components found for: HGA1C,  MPG CARDIAC ENZYMES Lab Results  Component Value Date   CKTOTAL 31 01/04/2012   CKMB 2.3 01/04/2012   TROPONINI <0.03 01/11/2015   TROPONINI <0.30 01/04/2012   TROPONINI <0.30 01/04/2012   BNP No results for input(s): PROBNP in the last 8760 hours. TSH No results for input(s): TSH in the last 8760 hours. CHOLESTEROL No results for input(s): CHOL in the last 8760 hours.  Scheduled  Meds:  amiodarone  100 mg Oral Daily   amLODipine  5 mg Oral Daily   atorvastatin  40 mg Oral q1800   clopidogrel  75 mg Oral Daily   diclofenac Sodium  2 g Topical BID   isosorbide mononitrate  30 mg Oral Daily   levothyroxine  50 mcg Oral Daily   losartan  25 mg Oral Daily   metoprolol tartrate  25 mg Oral BID   pantoprazole  40 mg Oral Daily   potassium chloride SA  20 mEq Oral Daily   sodium chloride flush  3 mL Intravenous Q12H   Continuous Infusions:  sodium chloride     PRN Meds:.sodium chloride, acetaminophen, melatonin, ondansetron (ZOFRAN) IV, polyvinyl alcohol, sodium chloride flush  Assessment/Plan: Acute diastolic left heart failure HCM, obstructive Mild MV stenosis Chest pain Paroxysmal atrial  fibrillation HTN Hypothyroidism S/P stroke  Plan: Continue medical therapy. PT evaluation.   LOS: 1 day   Time spent including chart review, lab review, examination, discussion with patient : 30 min   Dixie Dials  MD  11/30/2021, 7:15 PM

## 2021-11-30 NOTE — Progress Notes (Signed)
Patient is from [place]. TOC will continue to follow and assist with patients dc planning needs.

## 2021-11-30 NOTE — Care Management CC44 (Signed)
Condition Code 44 Documentation Completed  Patient Details  Name: MARYLYNN RIGDON MRN: [id number] Date of Birth: [birth year]   Condition Code 44 given:  Yes Patient signature on Condition Code 44 notice:  Yes Documentation of 2 MD's agreement:  Yes Code 44 added to claim:  Yes    Bethena Roys, RN 11/30/2021, 5:10 PM

## 2021-12-01 DIAGNOSIS — I739 Peripheral vascular disease, unspecified: Secondary | ICD-10-CM | POA: Diagnosis present

## 2021-12-01 DIAGNOSIS — Z85048 Personal history of other malignant neoplasm of rectum, rectosigmoid junction, and anus: Secondary | ICD-10-CM | POA: Diagnosis not present

## 2021-12-01 DIAGNOSIS — Z8673 Personal history of transient ischemic attack (TIA), and cerebral infarction without residual deficits: Secondary | ICD-10-CM | POA: Diagnosis not present

## 2021-12-01 DIAGNOSIS — Z8249 Family history of ischemic heart disease and other diseases of the circulatory system: Secondary | ICD-10-CM | POA: Diagnosis not present

## 2021-12-01 DIAGNOSIS — N182 Chronic kidney disease, stage 2 (mild): Secondary | ICD-10-CM | POA: Diagnosis present

## 2021-12-01 DIAGNOSIS — I5033 Acute on chronic diastolic (congestive) heart failure: Secondary | ICD-10-CM | POA: Diagnosis present

## 2021-12-01 DIAGNOSIS — M8580 Other specified disorders of bone density and structure, unspecified site: Secondary | ICD-10-CM | POA: Diagnosis present

## 2021-12-01 DIAGNOSIS — R079 Chest pain, unspecified: Secondary | ICD-10-CM | POA: Diagnosis present

## 2021-12-01 DIAGNOSIS — E039 Hypothyroidism, unspecified: Secondary | ICD-10-CM | POA: Diagnosis present

## 2021-12-01 DIAGNOSIS — Z96652 Presence of left artificial knee joint: Secondary | ICD-10-CM | POA: Diagnosis present

## 2021-12-01 DIAGNOSIS — I342 Nonrheumatic mitral (valve) stenosis: Secondary | ICD-10-CM | POA: Diagnosis not present

## 2021-12-01 DIAGNOSIS — Z85038 Personal history of other malignant neoplasm of large intestine: Secondary | ICD-10-CM | POA: Diagnosis not present

## 2021-12-01 DIAGNOSIS — I447 Left bundle-branch block, unspecified: Secondary | ICD-10-CM | POA: Diagnosis present

## 2021-12-01 DIAGNOSIS — K589 Irritable bowel syndrome without diarrhea: Secondary | ICD-10-CM | POA: Diagnosis present

## 2021-12-01 DIAGNOSIS — E785 Hyperlipidemia, unspecified: Secondary | ICD-10-CM | POA: Diagnosis present

## 2021-12-01 DIAGNOSIS — I421 Obstructive hypertrophic cardiomyopathy: Secondary | ICD-10-CM | POA: Diagnosis not present

## 2021-12-01 DIAGNOSIS — Z7902 Long term (current) use of antithrombotics/antiplatelets: Secondary | ICD-10-CM | POA: Diagnosis not present

## 2021-12-01 DIAGNOSIS — Z79899 Other long term (current) drug therapy: Secondary | ICD-10-CM | POA: Diagnosis not present

## 2021-12-01 DIAGNOSIS — Z79890 Hormone replacement therapy (postmenopausal): Secondary | ICD-10-CM | POA: Diagnosis not present

## 2021-12-01 DIAGNOSIS — Z853 Personal history of malignant neoplasm of breast: Secondary | ICD-10-CM | POA: Diagnosis not present

## 2021-12-01 DIAGNOSIS — Z96642 Presence of left artificial hip joint: Secondary | ICD-10-CM | POA: Diagnosis present

## 2021-12-01 DIAGNOSIS — I5031 Acute diastolic (congestive) heart failure: Secondary | ICD-10-CM | POA: Diagnosis not present

## 2021-12-01 DIAGNOSIS — Z8261 Family history of arthritis: Secondary | ICD-10-CM | POA: Diagnosis not present

## 2021-12-01 DIAGNOSIS — R001 Bradycardia, unspecified: Secondary | ICD-10-CM | POA: Diagnosis not present

## 2021-12-01 DIAGNOSIS — K219 Gastro-esophageal reflux disease without esophagitis: Secondary | ICD-10-CM | POA: Diagnosis present

## 2021-12-01 DIAGNOSIS — I130 Hypertensive heart and chronic kidney disease with heart failure and stage 1 through stage 4 chronic kidney disease, or unspecified chronic kidney disease: Secondary | ICD-10-CM | POA: Diagnosis present

## 2021-12-01 DIAGNOSIS — M545 Low back pain, unspecified: Secondary | ICD-10-CM | POA: Diagnosis present

## 2021-12-01 DIAGNOSIS — I480 Paroxysmal atrial fibrillation: Secondary | ICD-10-CM | POA: Diagnosis present

## 2021-12-01 DIAGNOSIS — M1990 Unspecified osteoarthritis, unspecified site: Secondary | ICD-10-CM | POA: Diagnosis present

## 2021-12-01 LAB — BASIC METABOLIC PANEL
Anion gap: 6 (ref 5–15)
BUN: 22 mg/dL (ref 8–23)
CO2: 24 mmol/L (ref 22–32)
Calcium: 10 mg/dL (ref 8.9–10.3)
Chloride: 104 mmol/L (ref 98–111)
Creatinine, Ser: 1.18 mg/dL — ABNORMAL HIGH (ref 0.44–1.00)
GFR, Estimated: 43 mL/min — ABNORMAL LOW (ref >60)
Glucose, Bld: 104 mg/dL — ABNORMAL HIGH (ref 70–99)
Potassium: 4.3 mmol/L (ref 3.5–5.1)
Sodium: 134 mmol/L — ABNORMAL LOW (ref 135–145)

## 2021-12-01 MED ORDER — HYDRALAZINE HCL 10 MG PO TABS
10.0000 mg | ORAL_TABLET | Freq: Two times a day (BID) | ORAL | Status: DC
  Administered 2021-12-01 – 2021-12-02 (×3): 10 mg via ORAL
  Filled 2021-12-01 (×3): qty 1

## 2021-12-01 NOTE — Progress Notes (Signed)
Mobility Specialist Progress Note    12/01/21 1337  Mobility  Activity Ambulated with assistance in hallway  Level of Assistance Minimal assist, patient does 75% or more  Assistive Device Front wheel walker  Distance Ambulated (ft) 150 ft  Activity Response Tolerated well  $Mobility charge 1 Mobility   Pre-Mobility: 44 HR, 118/63 BP, 100% SpO2 During Mobility: 63 HR Post-Mobility: 46 HR, 161/63 BP, 95% SpO2  Pt received in bed and agreeable. No complaints on walk. Returned to chair with call bell in reach and chair alarm on.    Hildred Alamin Mobility Specialist  Primary: 5N M.S. Phone: [phone] Secondary: 6N M.S. Phone: [phone]

## 2021-12-01 NOTE — Plan of Care (Signed)
  Problem: Clinical Measurements: Goal: Respiratory complications will improve Outcome: Progressing Goal: Cardiovascular complication will be avoided Outcome: Progressing   Problem: Activity: Goal: Risk for activity intolerance will decrease Outcome: Progressing   Problem: Nutrition: Goal: Adequate nutrition will be maintained Outcome: Progressing   Problem: Elimination: Goal: Will not experience complications related to urinary retention Outcome: Progressing   Problem: Pain Managment: Goal: General experience of comfort will improve Outcome: Progressing   Problem: Safety: Goal: Ability to remain free from injury will improve Outcome: Not Progressing   Problem: Skin Integrity: Goal: Risk for impaired skin integrity will decrease Outcome: Progressing

## 2021-12-01 NOTE — Progress Notes (Signed)
Ref: [name] Carol, MD   Subjective:  Sinus bradycardia, HR in 40's. She is not symptomatic as she is bed ridden.  Objective:  Vital Signs in the last 24 hours: Temp:  [97.8 F (36.6 C)-98.3 F (36.8 C)] 98.1 F (36.7 C) (05/31 1023) Pulse Rate:  [47-55] 49 (05/31 1153) Cardiac Rhythm: Sinus bradycardia (05/31 1023) Resp:  [14-20] 19 (05/31 1153) BP: (125-164)/(56-74) 125/70 (05/31 1153) SpO2:  [100 %] 100 % (05/31 1153) Weight:  [69.6 kg] 69.6 kg (05/31 0459)  Physical Exam: BP Readings from Last 1 Encounters:  12/01/21 125/70     Wt Readings from Last 1 Encounters:  12/01/21 69.6 kg    Weight change: -2.118 kg Body mass index is 24.76 kg/m. HEENT: [place]/AT, Eyes-Blue, Conjunctiva-Pink, Sclera-Non-icteric Neck: No JVD, No bruit, Trachea midline. Lungs:  Clearing, Bilateral. Cardiac:  Regular rhythm, normal S1 and S2, no S3. II/VI systolic murmur. Abdomen:  Soft, non-tender. BS present. Extremities:  No edema present. No cyanosis. No clubbing. CNS: AxOx2, Cranial nerves grossly intact, moves all 4 extremities.  Skin: Warm and dry.   Intake/Output from previous day: 05/30 0701 - 05/31 0700 In: 480 [P.O.:480] Out: 2300 [Urine:2300]    Lab Results: BMET    Component Value Date/Time   NA 134 (L) 12/01/2021 0210   NA 137 11/30/2021 0703   NA 137 11/29/2021 1420   K 4.3 12/01/2021 0210   K 3.6 11/30/2021 0703   K 3.9 11/29/2021 1420   CL 104 12/01/2021 0210   CL 103 11/30/2021 0703   CL 106 11/29/2021 1420   CO2 24 12/01/2021 0210   CO2 27 11/30/2021 0703   CO2 24 11/29/2021 1420   GLUCOSE 104 (H) 12/01/2021 0210   GLUCOSE 105 (H) 11/30/2021 0703   GLUCOSE 110 (H) 11/29/2021 1420   BUN 22 12/01/2021 0210   BUN 19 11/30/2021 0703   BUN 18 11/29/2021 1420   CREATININE 1.18 (H) 12/01/2021 0210   CREATININE 1.02 (H) 11/30/2021 0703   CREATININE 1.21 (H) 11/29/2021 1420   CALCIUM 10.0 12/01/2021 0210   CALCIUM 9.3 11/30/2021 0703   CALCIUM 9.1 11/29/2021 1420    GFRNONAA 43 (L) 12/01/2021 0210   GFRNONAA 51 (L) 11/30/2021 0703   GFRNONAA 42 (L) 11/29/2021 1420   GFRAA >60 08/31/2019 0435   GFRAA 53 (L) 08/30/2019 0955   GFRAA >60 05/19/2019 0323   CBC    Component Value Date/Time   WBC 8.1 11/29/2021 1420   RBC 4.02 11/29/2021 1420   HGB 11.0 (L) 11/29/2021 1420   HCT 36.2 11/29/2021 1420   PLT 209 11/29/2021 1420   MCV 90.0 11/29/2021 1420   MCH 27.4 11/29/2021 1420   MCHC 30.4 11/29/2021 1420   RDW 17.7 (H) 11/29/2021 1420   LYMPHSABS 2.0 11/29/2021 1420   MONOABS 1.2 (H) 11/29/2021 1420   EOSABS 0.1 11/29/2021 1420   BASOSABS 0.1 11/29/2021 1420   HEPATIC Function Panel Recent Labs    11/29/21 1420  PROT 6.0*   HEMOGLOBIN A1C No components found for: HGA1C,  MPG CARDIAC ENZYMES Lab Results  Component Value Date   CKTOTAL 31 01/04/2012   CKMB 2.3 01/04/2012   TROPONINI <0.03 01/11/2015   TROPONINI <0.30 01/04/2012   TROPONINI <0.30 01/04/2012   BNP No results for input(s): PROBNP in the last 8760 hours. TSH No results for input(s): TSH in the last 8760 hours. CHOLESTEROL No results for input(s): CHOL in the last 8760 hours.  Scheduled Meds:  amiodarone  100 mg Oral Daily  amLODipine  5 mg Oral Daily   atorvastatin  40 mg Oral q1800   clopidogrel  75 mg Oral Daily   diclofenac Sodium  2 g Topical BID   hydrALAZINE  10 mg Oral BID   isosorbide mononitrate  30 mg Oral Daily   levothyroxine  50 mcg Oral Daily   losartan  25 mg Oral Daily   pantoprazole  40 mg Oral Daily   potassium chloride SA  20 mEq Oral Daily   sodium chloride flush  3 mL Intravenous Q12H   Continuous Infusions:  sodium chloride     PRN Meds:.sodium chloride, acetaminophen, melatonin, ondansetron (ZOFRAN) IV, polyvinyl alcohol, sodium chloride flush  Assessment/Plan: Acute diastolic left heart failure Sinus bradycardia HCM, obstructive Mild MV stenosis Chest pain Paroxysmal atrial fibrillation HTN Hypothyroidism S/P  stroke  Plan: DC metoprolol. Continue monitor. Hold amlodipine. Discussed care with Daughter/[name] and Nurse   LOS: 1 day   Time spent including chart review, lab review, examination, discussion with patient/Daughter/Nurse : 30 min   Dixie Dials  MD  12/01/2021, 6:13 PM

## 2021-12-02 LAB — BASIC METABOLIC PANEL
Anion gap: 6 (ref 5–15)
BUN: 19 mg/dL (ref 8–23)
CO2: 26 mmol/L (ref 22–32)
Calcium: 10 mg/dL (ref 8.9–10.3)
Chloride: 102 mmol/L (ref 98–111)
Creatinine, Ser: 0.88 mg/dL (ref 0.44–1.00)
GFR, Estimated: >60 mL/min (ref >60)
Glucose, Bld: 110 mg/dL — ABNORMAL HIGH (ref 70–99)
Potassium: 4.4 mmol/L (ref 3.5–5.1)
Sodium: 134 mmol/L — ABNORMAL LOW (ref 135–145)

## 2021-12-02 MED ORDER — FUROSEMIDE 40 MG PO TABS: 20.0000 mg | ORAL_TABLET | ORAL | 1 refills | Status: DC

## 2021-12-02 MED ORDER — POTASSIUM CHLORIDE CRYS ER 20 MEQ PO TBCR: 20.0000 meq | EXTENDED_RELEASE_TABLET | ORAL | 0 refills | Status: AC

## 2021-12-02 MED ORDER — HYDRALAZINE HCL 10 MG PO TABS: 10.0000 mg | ORAL_TABLET | Freq: Two times a day (BID) | ORAL | 3 refills | Status: DC

## 2021-12-02 NOTE — Discharge Summary (Signed)
Physician Discharge Summary  Patient ID: [name] MRN: [id number] DOB/AGE: 08/09/1924 88 y.o.  Admit date: 11/29/2021 Discharge date: 12/02/2021  Admission Diagnoses: Acute diastolic left heart failure Chest pain Paroxysmal atrial fibrilation HTN Hypothyroidism S/P stroke  Discharge Diagnoses:  Principal Problem:   Acute diastolic heart failure (HCC) Active problems:  Sinus bradycardia, resolving  LBBB  HCM, obstructive  Mild MV stenosis  Chest pain  Paroxysmal atrial fibrillation  HTN  Hypothyroidism  S/P stroke  Discharged Condition: fair  Hospital Course: 88 years old female [facility] had recurrent chest pain and shortness of breath with elevated BNP of 534.4 pg and EKG showing sinus rhythm with LBBB. She has PMH of paroxysmal atrial fibrillation, diastolic left heart failure, HTN, Hypothyroidism, S/P rectal cancer, S/P right breast cancer, stroke, IBS, CKD II and low back pain. She responded to controlled diuresis. Her echocardiogram confirmed diastolic dysfunction with LVH and mild LV outflow tract obstruction and mild MV stenosis. She did not tolerate betablocker which gave her sinus bradycardia. She was transferred to [facility] in stable condition with f/u by Primary care in 1 week and me in 1-4 weeks. Anticoagulation was deferred due to unsteady gait and h/o hemorrhagic stroke.  Consults: cardiology  Significant Diagnostic Studies: labs: Near normal CBC and BMET except borderline low Hgb, Elevated BN P of 534.4 pg. Near normal troponin I levels.  EKG: NSR + LBBB.  CXR: No acute abnormality.  Echocardiogram: Normal LV systolic function, Diastolic dysfunction with LVH and mild outflow tract obstruction and mild MV stenosis.  Treatments: cardiac meds: amlodipine, amiodarone, atorvastatin, Plavix , Imdur, Losartan and Hydralazine. M-W-F dosing of furosemide and potassium.  Discharge Exam: Blood pressure (!) 157/51, pulse (!) 52, temperature 98.5  F (36.9 C), temperature source Oral, resp. rate 19, height '5\' 6"'$  (1.676 m), weight 71 kg, SpO2 99 %. General appearance: alert, cooperative and appears stated age. Head: Normocephalic, atraumatic. Eyes: Blue eyes, pink conjunctiva, corneas clear.   Neck: No adenopathy, no carotid bruit, no JVD, supple, symmetrical, trachea midline and thyroid not enlarged. Resp: Clear to auscultation bilaterally. Cardio: Regular rate and rhythm, S1, S2 normal, II/VI systolic murmur, no click, rub or gallop. GI: Soft, non-tender; bowel sounds normal; no organomegaly. Extremities: No edema, cyanosis or clubbing. Skin: Warm and dry.  Neurologic: Alert and oriented X 3, normal strength and tone. Slow gait.  Disposition: Discharge disposition: 01-Home or Self Care        Allergies as of 12/02/2021       Reactions   Codeine Itching, Anxiety, Other (See Comments)   Caused the patient to feel nervous/confused, also   Pregabalin Anxiety, Other (See Comments)   Caused nervousness        Medication List     STOP taking these medications    metoprolol tartrate 50 MG tablet Commonly known as: LOPRESSOR   mupirocin ointment 2 % Commonly known as: BACTROBAN       TAKE these medications    acetaminophen 500 MG tablet Commonly known as: TYLENOL Take 1,000 mg by mouth every 6 (six) hours as needed for headache (pain).   amiodarone 100 MG tablet Commonly known as: PACERONE Take 1 tablet (100 mg total) by mouth daily.   amLODipine 5 MG tablet Commonly known as: NORVASC Take 1 tablet (5 mg total) by mouth daily.   atorvastatin 40 MG tablet Commonly known as: LIPITOR Take 1 tablet (40 mg total) by mouth daily at 6 PM.   clopidogrel 75 MG tablet Commonly  known as: PLAVIX Take 1 tablet (75 mg total) by mouth daily.   diclofenac sodium 1 % Gel Commonly known as: VOLTAREN APPLY 2-4 GRAMS TO AFFECTED AREA 2-3 TIMES A DAY AS NEEDED. What changed: See the new instructions.   esomeprazole 40  MG capsule Commonly known as: NEXIUM Take 40 mg by mouth daily before breakfast.   furosemide 40 MG tablet Commonly known as: LASIX Take 0.5 tablets (20 mg total) by mouth every Monday, Wednesday, and Friday. Start taking on: December 03, 2021 What changed:  how much to take when to take this   hydrALAZINE 10 MG tablet Commonly known as: APRESOLINE Take 1 tablet (10 mg total) by mouth 2 (two) times daily.   isosorbide mononitrate 30 MG 24 hr tablet Commonly known as: IMDUR Take 1 tablet (30 mg total) by mouth daily.   levothyroxine 50 MCG tablet Commonly known as: SYNTHROID Take 50 mcg by mouth daily.   losartan 25 MG tablet Commonly known as: COZAAR Take 1 tablet (25 mg total) by mouth daily.   melatonin 3 MG Tabs tablet Take 3 mg by mouth at bedtime as needed (sleep).   nitroGLYCERIN 0.4 MG SL tablet Commonly known as: NITROSTAT Place 1 tablet (0.4 mg total) under the tongue every 5 (five) minutes x 3 doses as needed for chest pain.   potassium chloride SA 20 MEQ tablet Commonly known as: Klor-Con M20 Take 1 tablet (20 mEq total) by mouth every Monday, Wednesday, and Friday. Start taking on: December 03, 2021 What changed: when to take this   Systane 0.4-0.3 % Soln Generic drug: Polyethyl Glycol-Propyl Glycol Place 1 drop into both eyes every 2 (two) hours.   Systane 0.4-0.3 % Gel ophthalmic gel Generic drug: Polyethyl Glycol-Propyl Glycol Place 1 application into both eyes at bedtime.        Follow-up Information     [name], MD Follow up in 1 week(s).   Specialty: Internal Medicine Contact information: [address] [place] [address] [phone]         Dixie Dials, MD Follow up in 4 week(s).   Specialty: Cardiology Contact information: [place] [address] [phone]                 Time spent: Review of old chart, current chart, lab, x-ray, cardiac tests and discussion with patient over  60 minutes.  Signed: Birdie Riddle 12/02/2021, 9:12 AM

## 2021-12-02 NOTE — Progress Notes (Signed)
Notified by Monitor room that pt had a 2.97 second pause. VS taken no issues noted. Pt was asleep w/o complaints and remains asymptomatic at this time. Dr. Blossom Hoops with cardiology notified as well.

## 2021-12-02 NOTE — Plan of Care (Signed)

## 2021-12-20 ENCOUNTER — Emergency Department (HOSPITAL_COMMUNITY): Payer: Medicare Other

## 2021-12-20 ENCOUNTER — Emergency Department (HOSPITAL_COMMUNITY)
Admission: EM | Admit: 2021-12-20 | Discharge: 2021-12-20 | Disposition: A | Discharge status: Nursing Home (Includes ICF) | Payer: Medicare Other | Attending: Emergency Medicine | Admitting: Emergency Medicine

## 2021-12-20 DIAGNOSIS — Z743 Need for continuous supervision: Secondary | ICD-10-CM | POA: Diagnosis not present

## 2021-12-20 DIAGNOSIS — R6889 Other general symptoms and signs: Secondary | ICD-10-CM | POA: Diagnosis not present

## 2021-12-20 DIAGNOSIS — W1839XA Other fall on same level, initial encounter: Secondary | ICD-10-CM | POA: Diagnosis not present

## 2021-12-20 DIAGNOSIS — Z79899 Other long term (current) drug therapy: Secondary | ICD-10-CM | POA: Insufficient documentation

## 2021-12-20 DIAGNOSIS — R410 Disorientation, unspecified: Secondary | ICD-10-CM | POA: Diagnosis not present

## 2021-12-20 DIAGNOSIS — M25561 Pain in right knee: Secondary | ICD-10-CM | POA: Diagnosis not present

## 2021-12-20 DIAGNOSIS — R0602 Shortness of breath: Secondary | ICD-10-CM | POA: Diagnosis not present

## 2021-12-20 DIAGNOSIS — M25512 Pain in left shoulder: Secondary | ICD-10-CM | POA: Diagnosis not present

## 2021-12-20 DIAGNOSIS — I447 Left bundle-branch block, unspecified: Secondary | ICD-10-CM | POA: Insufficient documentation

## 2021-12-20 DIAGNOSIS — M25519 Pain in unspecified shoulder: Secondary | ICD-10-CM | POA: Diagnosis not present

## 2021-12-20 DIAGNOSIS — M19012 Primary osteoarthritis, left shoulder: Secondary | ICD-10-CM | POA: Diagnosis not present

## 2021-12-20 DIAGNOSIS — Z950 Presence of cardiac pacemaker: Secondary | ICD-10-CM | POA: Diagnosis not present

## 2021-12-20 DIAGNOSIS — T07XXXA Unspecified multiple injuries, initial encounter: Secondary | ICD-10-CM

## 2021-12-20 DIAGNOSIS — W19XXXA Unspecified fall, initial encounter: Secondary | ICD-10-CM

## 2021-12-20 DIAGNOSIS — Z7902 Long term (current) use of antithrombotics/antiplatelets: Secondary | ICD-10-CM | POA: Diagnosis not present

## 2021-12-20 DIAGNOSIS — R079 Chest pain, unspecified: Secondary | ICD-10-CM | POA: Insufficient documentation

## 2021-12-20 DIAGNOSIS — Y92009 Unspecified place in unspecified non-institutional (private) residence as the place of occurrence of the external cause: Secondary | ICD-10-CM | POA: Insufficient documentation

## 2021-12-20 DIAGNOSIS — M2550 Pain in unspecified joint: Secondary | ICD-10-CM | POA: Diagnosis not present

## 2021-12-20 DIAGNOSIS — F29 Unspecified psychosis not due to a substance or known physiological condition: Secondary | ICD-10-CM | POA: Diagnosis not present

## 2021-12-20 DIAGNOSIS — R0789 Other chest pain: Secondary | ICD-10-CM | POA: Diagnosis not present

## 2021-12-20 DIAGNOSIS — I499 Cardiac arrhythmia, unspecified: Secondary | ICD-10-CM | POA: Diagnosis not present

## 2021-12-20 DIAGNOSIS — Z7401 Bed confinement status: Secondary | ICD-10-CM | POA: Diagnosis not present

## 2021-12-20 DIAGNOSIS — I6782 Cerebral ischemia: Secondary | ICD-10-CM | POA: Diagnosis not present

## 2021-12-20 LAB — TROPONIN I (HIGH SENSITIVITY)
Troponin I (High Sensitivity): 16 ng/L (ref <18)
Troponin I (High Sensitivity): 18 ng/L — ABNORMAL HIGH (ref <18)

## 2021-12-20 LAB — COMPREHENSIVE METABOLIC PANEL
ALT: 13 U/L (ref 0–44)
AST: 19 U/L (ref 15–41)
Albumin: 3.9 g/dL (ref 3.5–5.0)
Alkaline Phosphatase: 47 U/L (ref 38–126)
Anion gap: 11 (ref 5–15)
BUN: 12 mg/dL (ref 8–23)
CO2: 21 mmol/L — ABNORMAL LOW (ref 22–32)
Calcium: 9.5 mg/dL (ref 8.9–10.3)
Chloride: 105 mmol/L (ref 98–111)
Creatinine, Ser: 0.96 mg/dL (ref 0.44–1.00)
GFR, Estimated: 55 mL/min — ABNORMAL LOW (ref >60)
Glucose, Bld: 103 mg/dL — ABNORMAL HIGH (ref 70–99)
Potassium: 4.2 mmol/L (ref 3.5–5.1)
Sodium: 137 mmol/L (ref 135–145)
Total Bilirubin: 0.8 mg/dL (ref 0.3–1.2)
Total Protein: 6.7 g/dL (ref 6.5–8.1)

## 2021-12-20 LAB — CBC WITH DIFFERENTIAL/PLATELET
Abs Immature Granulocytes: 0.04 10*3/uL (ref 0.00–0.07)
Basophils Absolute: 0 10*3/uL (ref 0.0–0.1)
Basophils Relative: 1 %
Eosinophils Absolute: 0 10*3/uL (ref 0.0–0.5)
Eosinophils Relative: 1 %
HCT: 41.2 % (ref 36.0–46.0)
Hemoglobin: 12.2 g/dL (ref 12.0–15.0)
Immature Granulocytes: 1 %
Lymphocytes Relative: 22 %
Lymphs Abs: 1.3 10*3/uL (ref 0.7–4.0)
MCH: 26.9 pg (ref 26.0–34.0)
MCHC: 29.6 g/dL — ABNORMAL LOW (ref 30.0–36.0)
MCV: 90.7 fL (ref 80.0–100.0)
Monocytes Absolute: 0.7 10*3/uL (ref 0.1–1.0)
Monocytes Relative: 11 %
Neutro Abs: 3.9 10*3/uL (ref 1.7–7.7)
Neutrophils Relative %: 64 %
Platelets: 198 10*3/uL (ref 150–400)
RBC: 4.54 MIL/uL (ref 3.87–5.11)
RDW: 17.9 % — ABNORMAL HIGH (ref 11.5–15.5)
WBC: 5.9 10*3/uL (ref 4.0–10.5)
nRBC: 0 % (ref 0.0–0.2)

## 2021-12-20 LAB — CBG MONITORING, ED: Glucose-Capillary: 110 mg/dL — ABNORMAL HIGH (ref 70–99)

## 2021-12-20 MED ORDER — HYDRALAZINE HCL 10 MG PO TABS
10.0000 mg | ORAL_TABLET | Freq: Once | ORAL | Status: AC
  Administered 2021-12-20: 10 mg via ORAL
  Filled 2021-12-20: qty 1

## 2021-12-20 MED ORDER — HYDRALAZINE HCL 10 MG PO TABS
10.0000 mg | ORAL_TABLET | Freq: Once | ORAL | Status: AC
Start: 2021-12-20 — End: 2021-12-20
  Administered 2021-12-20: 10 mg via ORAL
  Filled 2021-12-20: qty 1

## 2021-12-20 MED ORDER — AMLODIPINE BESYLATE 5 MG PO TABS
10.0000 mg | ORAL_TABLET | Freq: Once | ORAL | Status: AC
  Administered 2021-12-20: 10 mg via ORAL
  Filled 2021-12-20: qty 2

## 2021-12-20 NOTE — ED Notes (Signed)
Got patient on the monitor help get a brief on patient placed a external cath patient is resting with call bell in reach

## 2021-12-20 NOTE — ED Provider Notes (Signed)
[facility] EMERGENCY DEPARTMENT Provider Note   CSN: [id number] Arrival date & time: 12/20/21  1128     History  Chief Complaint  Patient presents with   fall on thinner    [name] is a 88 y.o. female.  HPI 88 year old female presents after a fall at independent living. History is from patient and EMS. Patient is not sure how she fell. She can't remember what exactly she was doing, but all of a sudden ended up on the ground. Has a pacemaker. Is also having some chest pain that started this morning. This is a recurrent issue for her and she was recently admitted for the same.  Some shortness of breath as well.  Is having chronic left shoulder and right knee pain, the right knee more than the shoulder.  Does not think she hit her head.  She is on Plavix according to EMS.  Home Medications Prior to Admission medications   Medication Sig Start Date End Date Taking? Authorizing Provider  acetaminophen (TYLENOL) 500 MG tablet Take 1,000 mg by mouth every 6 (six) hours as needed for headache (pain).    [provider]  amiodarone (PACERONE) 100 MG tablet Take 1 tablet (100 mg total) by mouth daily. 09/03/19   Dixie Dials, MD  amLODipine (NORVASC) 5 MG tablet Take 1 tablet (5 mg total) by mouth daily. 05/19/19   Domenic Polite, MD  atorvastatin (LIPITOR) 40 MG tablet Take 1 tablet (40 mg total) by mouth daily at 6 PM. 09/02/19   Dixie Dials, MD  clopidogrel (PLAVIX) 75 MG tablet Take 1 tablet (75 mg total) by mouth daily. 09/02/19   Dixie Dials, MD  diclofenac sodium (VOLTAREN) 1 % GEL APPLY 2-4 GRAMS TO AFFECTED AREA 2-3 TIMES A DAY AS NEEDED. Patient taking differently: Apply 2-4 g topically 2 (two) times daily. 04/30/19   Meredith Pel, MD  esomeprazole (NEXIUM) 40 MG capsule Take 40 mg by mouth daily before breakfast.     [provider]  furosemide (LASIX) 40 MG tablet Take 0.5 tablets (20 mg total) by mouth every Monday, Wednesday, and  Friday. 12/03/21   Dixie Dials, MD  hydrALAZINE (APRESOLINE) 10 MG tablet Take 1 tablet (10 mg total) by mouth 2 (two) times daily. 12/02/21   Dixie Dials, MD  isosorbide mononitrate (IMDUR) 30 MG 24 hr tablet Take 1 tablet (30 mg total) by mouth daily. 09/02/19   Dixie Dials, MD  levothyroxine (SYNTHROID) 50 MCG tablet Take 50 mcg by mouth daily. 03/04/19   [provider]  losartan (COZAAR) 25 MG tablet Take 1 tablet (25 mg total) by mouth daily. 09/03/19   Dixie Dials, MD  Melatonin 3 MG TABS Take 3 mg by mouth at bedtime as needed (sleep).     [provider]  nitroGLYCERIN (NITROSTAT) 0.4 MG SL tablet Place 1 tablet (0.4 mg total) under the tongue every 5 (five) minutes x 3 doses as needed for chest pain. Patient not taking: Reported on 11/30/2021 09/02/19   Dixie Dials, MD  Polyethyl Glycol-Propyl Glycol (SYSTANE) 0.4-0.3 % GEL ophthalmic gel Place 1 application into both eyes at bedtime.    [provider]  Polyethyl Glycol-Propyl Glycol (SYSTANE) 0.4-0.3 % SOLN Place 1 drop into both eyes every 2 (two) hours.    [provider]  potassium chloride SA (KLOR-CON M20) 20 MEQ tablet Take 1 tablet (20 mEq total) by mouth every Monday, Wednesday, and Friday. 12/03/21   Dixie Dials, MD  Allergies    Codeine and Pregabalin    Review of Systems   Review of Systems  Respiratory:  Positive for shortness of breath.   Cardiovascular:  Positive for chest pain.  Musculoskeletal:  Positive for arthralgias.  Neurological:  Negative for dizziness and headaches.    Physical Exam Updated Vital Signs BP (!) 214/72   Pulse (!) 57   Temp 98 F (36.7 C) (Oral)   Resp 15   SpO2 100%  Physical Exam Vitals and nursing note reviewed.  Constitutional:      Appearance: She is well-developed.  HENT:     Head: Normocephalic and atraumatic.  Cardiovascular:     Rate and Rhythm: Normal rate and regular rhythm.     Heart sounds: Normal heart sounds.  Pulmonary:      Effort: Pulmonary effort is normal.     Breath sounds: Normal breath sounds.  Abdominal:     Palpations: Abdomen is soft.     Tenderness: There is no abdominal tenderness.  Musculoskeletal:     Left shoulder: No tenderness. Normal range of motion.     Right upper leg: No deformity or tenderness.     Right knee: Normal range of motion. Tenderness present.  Skin:    General: Skin is warm and dry.  Neurological:     Mental Status: She is alert. She is disoriented.     Comments: Patient is disoriented to place and time. Unclear baseline. CN 3-12 grossly intact. 5/5 strength in all 4 extremities. Normal finger to nose.      ED Results / Procedures / Treatments   Labs (all labs ordered are listed, but only abnormal results are displayed) Labs Reviewed  COMPREHENSIVE METABOLIC PANEL - Abnormal; Notable for the following components:      Result Value   CO2 21 (*)    Glucose, Bld 103 (*)    GFR, Estimated 55 (*)    All other components within normal limits  CBC WITH DIFFERENTIAL/PLATELET - Abnormal; Notable for the following components:   MCHC 29.6 (*)    RDW 17.9 (*)    All other components within normal limits  CBG MONITORING, ED - Abnormal; Notable for the following components:   Glucose-Capillary 110 (*)    All other components within normal limits  URINALYSIS, ROUTINE W REFLEX MICROSCOPIC  TROPONIN I (HIGH SENSITIVITY)  TROPONIN I (HIGH SENSITIVITY)    EKG EKG Interpretation  Date/Time:  Monday December 20 2021 14:02:57 EDT Ventricular Rate:  61 PR Interval:  292 QRS Duration: 168 QT Interval:  480 QTC Calculation: 484 R Axis:   -7 Text Interpretation: Sinus rhythm Prolonged PR interval Left bundle branch block Confirmed by Sherwood Gambler [phone]) on 12/20/2021 2:10:24 PM  Radiology CT Head Wo Contrast  Result Date: 12/20/2021 CLINICAL DATA:  Head trauma.  Fall. EXAM: CT HEAD WITHOUT CONTRAST TECHNIQUE: Contiguous axial images were obtained from the base of the skull  through the vertex without intravenous contrast. RADIATION DOSE REDUCTION: This exam was performed according to the departmental dose-optimization program which includes automated exposure control, adjustment of the mA and/or kV according to patient size and/or use of iterative reconstruction technique. COMPARISON:  Head CT 05/18/2019 and MRI 05/19/2019 FINDINGS: Brain: There is no evidence of an acute infarct, intracranial hemorrhage, mass, midline shift, or extra-axial fluid collection. Hypodensities in the cerebral white matter bilaterally are unchanged and nonspecific but compatible with mild chronic small vessel ischemic disease. Small chronic infarcts are again noted in the right thalamus and cerebellum.  Generalized cerebral atrophy is within normal limits for age. Vascular: Calcified atherosclerosis at the skull base. No acutely suspicious vessel density. Skull: No fracture or suspicious osseous lesion. Sinuses/Orbits: Visualized paranasal sinuses and mastoid air cells are clear. Bilateral cataract extraction. Other: None. IMPRESSION: 1. No evidence of acute intracranial abnormality. 2. Mild chronic small vessel ischemic disease. Electronically Signed   By: Logan Bores M.D.   On: 12/20/2021 13:18   DG Chest 2 View  Result Date: 12/20/2021 CLINICAL DATA:  chest pain EXAM: CHEST - 2 VIEW COMPARISON:  Nov 30, 2021 FINDINGS: Mild cardiomegaly. Extensive atheromatous calcifications with tortuosity of the thoracic aorta. There are prominent interstitial changes seen, which appear stable. Likely bibasilar pleural pulmonary scarring greater on the right. Again seen are the surgical clips at the right hemithorax and the axillary region. Degenerative changes with loss of the glenohumeral joint space of the left side. IMPRESSION: No active cardiopulmonary disease. Interstitial changes. Bibasilar pleural pulmonary scarring greater on the right. Electronically Signed   By: Frazier Richards M.D.   On: 12/20/2021 13:00    DG Knee Complete 4 Views Right  Result Date: 12/20/2021 CLINICAL DATA:  Chronic right knee pain worsened after fall. EXAM: RIGHT KNEE - COMPLETE 4+ VIEW COMPARISON:  No relevant priors available for comparison at time dictation. FINDINGS: No evidence of fracture, dislocation, or joint effusion. Tricompartment degenerative change worse in the medial tibiofemoral compartment. Chondrocalcinosis. Vascular calcifications. IMPRESSION: No acute osseous abnormality. Tricompartment DJD, with chondrocalcinosis as can be seen with CPPD arthropathy. Electronically Signed   By: Dahlia Bailiff M.D.   On: 12/20/2021 12:59    Procedures Procedures    Medications Ordered in ED Medications  hydrALAZINE (APRESOLINE) tablet 10 mg (has no administration in time range)    ED Course/ Medical Decision Making/ A&P                           Medical Decision Making Amount and/or Complexity of Data Reviewed Independent Historian: EMS    Details: And daughter External Data Reviewed: notes. Labs: ordered. Radiology: ordered and independent interpretation performed. ECG/medicine tests: ordered and independent interpretation performed.  Risk Prescription drug management.   Patient presents with a fall and recurrent chest pain.  I suspect the chest pain is probably not ACS but given age and comorbidities such as hypertension and CHF she will need 2 troponins.  First troponin is within normal limits.  CT head obtained given the fall and unclear if she hit her head.  These images viewed by myself and no head bleed.  Chest x-ray and knee x-ray show no fractures, edema, or dislocation.  I talked with the patient's daughter over the phone and the patient does have chronic memory issues and so her mental status today seems to be baseline.  She normally walks with a walker and was able to get up and ambulate here without difficulty so I doubt she has had a posterior circulation stroke.  Daughter indicates that the  facility noted that she seemed to be bending over and lost her balance and fell.  Orthostatics unremarkable.  She does not actually have a pacemaker despite what EMS and patient had originally said.  However I doubt she had an acute arrhythmia. Chronic LBBB on EKG today.  Plan will be for second troponin and if unremarkable I think she could be discharged to go back to her facility.  Daughter in agreement. Care to Dr. Wyvonnia Dusky.  Final Clinical Impression(s) / ED Diagnoses Final diagnoses:  None    Rx / DC Orders ED Discharge Orders     None         Sherwood Gambler, MD 12/20/21 1534

## 2021-12-20 NOTE — ED Notes (Signed)
Pt ambulated w walker w steady gait. EDP aware.

## 2021-12-20 NOTE — ED Provider Notes (Signed)
Care assumed from Dr. Regenia Skeeter.  Patient from her living facility after falling.  Complaining of pain to her left shoulder and right knee.  Awaiting repeat troponin.  EKG here shows unchanged left bundle branch block.  CT head is negative. Repeat troponin is minimally changed.  Patient's blood pressure is elevated and she is given her home blood pressure medication  Blood pressure has improved.  Patient is able to ambulate.  She is complaining of pain to her left shoulder and right knee which is unchanged from her baseline.  Denies any new pain.  Troponin is flat.  She denies any chest pain or shortness of breath.  She appears stable to return to her facility per plan of Dr. Regenia Skeeter.    [name] Essex, MD 12/20/21 2102

## 2021-12-20 NOTE — Discharge Instructions (Addendum)
Your testing is reassuring.  No evidence of serious traumatic injury from your fall.  Your blood pressure is elevated and you are given your home medications.  No evidence of a heart attack. Follow-up with your doctor.  Return to the ED with new or worsening symptoms

## 2021-12-20 NOTE — ED Triage Notes (Signed)
Pr from [facility] green. Pt was making bed , lost balance, fell down, no injuries noted.No LOC. Did not hit head. Ambulatory.  Pt stated she has been having CP for couple of weeks. Pt has a pacemaker. 324 ASA given by EMS. No nitro was given. VSS. EKG remarkable w EMS.

## 2021-12-20 NOTE — ED Notes (Signed)
Pt's BP over 200. MD made aware.

## 2021-12-28 ENCOUNTER — Other Ambulatory Visit: Payer: Self-pay

## 2021-12-28 ENCOUNTER — Emergency Department (HOSPITAL_COMMUNITY): Payer: Medicare Other

## 2021-12-28 ENCOUNTER — Encounter (HOSPITAL_COMMUNITY): Payer: Self-pay | Admitting: Emergency Medicine

## 2021-12-28 ENCOUNTER — Emergency Department (HOSPITAL_COMMUNITY)
Admission: EM | Admit: 2021-12-28 | Discharge: 2021-12-28 | Disposition: A | Discharge status: Skilled Nursing Facility | Payer: Medicare Other | Attending: Emergency Medicine | Admitting: Emergency Medicine

## 2021-12-28 DIAGNOSIS — R0602 Shortness of breath: Secondary | ICD-10-CM | POA: Diagnosis not present

## 2021-12-28 DIAGNOSIS — R0781 Pleurodynia: Secondary | ICD-10-CM | POA: Diagnosis not present

## 2021-12-28 DIAGNOSIS — Z7902 Long term (current) use of antithrombotics/antiplatelets: Secondary | ICD-10-CM | POA: Insufficient documentation

## 2021-12-28 DIAGNOSIS — R072 Precordial pain: Secondary | ICD-10-CM

## 2021-12-28 DIAGNOSIS — M25512 Pain in left shoulder: Secondary | ICD-10-CM | POA: Diagnosis not present

## 2021-12-28 DIAGNOSIS — I447 Left bundle-branch block, unspecified: Secondary | ICD-10-CM | POA: Diagnosis not present

## 2021-12-28 DIAGNOSIS — R0789 Other chest pain: Secondary | ICD-10-CM | POA: Diagnosis not present

## 2021-12-28 DIAGNOSIS — R531 Weakness: Secondary | ICD-10-CM | POA: Diagnosis not present

## 2021-12-28 DIAGNOSIS — M549 Dorsalgia, unspecified: Secondary | ICD-10-CM | POA: Diagnosis not present

## 2021-12-28 DIAGNOSIS — I10 Essential (primary) hypertension: Secondary | ICD-10-CM | POA: Insufficient documentation

## 2021-12-28 DIAGNOSIS — Z79899 Other long term (current) drug therapy: Secondary | ICD-10-CM | POA: Diagnosis not present

## 2021-12-28 DIAGNOSIS — J9811 Atelectasis: Secondary | ICD-10-CM | POA: Diagnosis not present

## 2021-12-28 DIAGNOSIS — R079 Chest pain, unspecified: Secondary | ICD-10-CM | POA: Diagnosis not present

## 2021-12-28 DIAGNOSIS — Z743 Need for continuous supervision: Secondary | ICD-10-CM | POA: Diagnosis not present

## 2021-12-28 DIAGNOSIS — R6889 Other general symptoms and signs: Secondary | ICD-10-CM | POA: Diagnosis not present

## 2021-12-28 LAB — COMPREHENSIVE METABOLIC PANEL
ALT: 14 U/L (ref 0–44)
AST: 27 U/L (ref 15–41)
Albumin: 3.9 g/dL (ref 3.5–5.0)
Alkaline Phosphatase: 49 U/L (ref 38–126)
Anion gap: 14 (ref 5–15)
BUN: 11 mg/dL (ref 8–23)
CO2: 21 mmol/L — ABNORMAL LOW (ref 22–32)
Calcium: 10 mg/dL (ref 8.9–10.3)
Chloride: 104 mmol/L (ref 98–111)
Creatinine, Ser: 1.13 mg/dL — ABNORMAL HIGH (ref 0.44–1.00)
GFR, Estimated: 45 mL/min — ABNORMAL LOW (ref >60)
Glucose, Bld: 111 mg/dL — ABNORMAL HIGH (ref 70–99)
Potassium: 4.5 mmol/L (ref 3.5–5.1)
Sodium: 139 mmol/L (ref 135–145)
Total Bilirubin: 1 mg/dL (ref 0.3–1.2)
Total Protein: 6.5 g/dL (ref 6.5–8.1)

## 2021-12-28 LAB — CBC
HCT: 37.9 % (ref 36.0–46.0)
Hemoglobin: 12.4 g/dL (ref 12.0–15.0)
MCH: 28.4 pg (ref 26.0–34.0)
MCHC: 32.7 g/dL (ref 30.0–36.0)
MCV: 86.7 fL (ref 80.0–100.0)
Platelets: 198 10*3/uL (ref 150–400)
RBC: 4.37 MIL/uL (ref 3.87–5.11)
RDW: 17.6 % — ABNORMAL HIGH (ref 11.5–15.5)
WBC: 5.7 10*3/uL (ref 4.0–10.5)
nRBC: 0 % (ref 0.0–0.2)

## 2021-12-28 LAB — TROPONIN I (HIGH SENSITIVITY): Troponin I (High Sensitivity): 16 ng/L (ref <18)

## 2021-12-28 LAB — BRAIN NATRIURETIC PEPTIDE: B Natriuretic Peptide: 373.8 pg/mL — ABNORMAL HIGH (ref 0.0–100.0)

## 2021-12-28 MED ORDER — ACETAMINOPHEN 325 MG PO TABS
650.0000 mg | ORAL_TABLET | Freq: Once | ORAL | Status: AC
  Administered 2021-12-28: 650 mg via ORAL
  Filled 2021-12-28: qty 2

## 2021-12-28 NOTE — ED Triage Notes (Signed)
Pt arrives via EMS from [facility]- cp 6/10 woke her out of her sleep. Felt like her heart was racing, feeling SOB. Lungs clear, but pt tachypnic. Pt also complains of L shoulder pain Hx of PE, CHF EKG shows LBBB, HR 50's. EMS gave 324 mg ASA and 1 nitro. BP 178/92, after nitro 132/54. NItro relieved her pain.

## 2021-12-28 NOTE — ED Notes (Signed)
This RN spoke with Nadine Counts at [facility] and let him know pt will be arriving via [facility]

## 2021-12-28 NOTE — ED Provider Notes (Signed)
[facility] EMERGENCY DEPARTMENT Provider Note   CSN: [id number] Arrival date & time: 12/28/21  0932     History  Chief Complaint  Patient presents with   Chest Pain    [name] is a 88 y.o. female.  Pt with hx afib, presents via EMS from [facility] with c/o chest pain this AM.  EMS indicates pt c/o chest pain since awakening today, currently patient indicates only pain is in area of left shoulder. Symptoms acute onset, dull, mild, non radiating. No pleuritic pain or sob. No current/recent exertional cp or discomfort. No cough or uri symptoms. No fever or chills. No leg pain or swelling. No orthopnea/pnd.   The history is provided by the patient, medical records and the EMS personnel.  Chest Pain Associated symptoms: no abdominal pain, no back pain, no cough, no fever, no headache, no shortness of breath and no vomiting        Home Medications Prior to Admission medications   Medication Sig Start Date End Date Taking? Authorizing Provider  acetaminophen (TYLENOL) 500 MG tablet Take 1,000 mg by mouth every 6 (six) hours as needed for headache (pain).    [provider]  amiodarone (PACERONE) 100 MG tablet Take 1 tablet (100 mg total) by mouth daily. 09/03/19   Orpah Cobb, MD  amLODipine (NORVASC) 5 MG tablet Take 1 tablet (5 mg total) by mouth daily. 05/19/19   Zannie Cove, MD  atorvastatin (LIPITOR) 40 MG tablet Take 1 tablet (40 mg total) by mouth daily at 6 PM. 09/02/19   Orpah Cobb, MD  clopidogrel (PLAVIX) 75 MG tablet Take 1 tablet (75 mg total) by mouth daily. 09/02/19   Orpah Cobb, MD  diclofenac sodium (VOLTAREN) 1 % GEL APPLY 2-4 GRAMS TO AFFECTED AREA 2-3 TIMES A DAY AS NEEDED. Patient taking differently: Apply 2-4 g topically 2 (two) times daily. 04/30/19   Cammy Copa, MD  esomeprazole (NEXIUM) 40 MG capsule Take 40 mg by mouth daily before breakfast.     [provider]  furosemide (LASIX) 40 MG tablet Take 0.5  tablets (20 mg total) by mouth every Monday, Wednesday, and Friday. 12/03/21   Orpah Cobb, MD  hydrALAZINE (APRESOLINE) 10 MG tablet Take 1 tablet (10 mg total) by mouth 2 (two) times daily. 12/02/21   Orpah Cobb, MD  isosorbide mononitrate (IMDUR) 30 MG 24 hr tablet Take 1 tablet (30 mg total) by mouth daily. 09/02/19   Orpah Cobb, MD  levothyroxine (SYNTHROID) 50 MCG tablet Take 50 mcg by mouth daily. 03/04/19   [provider]  losartan (COZAAR) 25 MG tablet Take 1 tablet (25 mg total) by mouth daily. 09/03/19   Orpah Cobb, MD  Melatonin 3 MG TABS Take 3 mg by mouth at bedtime as needed (sleep).     [provider]  nitroGLYCERIN (NITROSTAT) 0.4 MG SL tablet Place 1 tablet (0.4 mg total) under the tongue every 5 (five) minutes x 3 doses as needed for chest pain. Patient not taking: Reported on 11/30/2021 09/02/19   Orpah Cobb, MD  Polyethyl Glycol-Propyl Glycol (SYSTANE) 0.4-0.3 % GEL ophthalmic gel Place 1 application into both eyes at bedtime.    [provider]  Polyethyl Glycol-Propyl Glycol (SYSTANE) 0.4-0.3 % SOLN Place 1 drop into both eyes every 2 (two) hours.    [provider]  potassium chloride SA (KLOR-CON M20) 20 MEQ tablet Take 1 tablet (20 mEq total) by mouth every Monday, Wednesday, and Friday. 12/03/21   Algie Coffer,  Viviano Simas, MD      Allergies    Codeine and Pregabalin    Review of Systems   Review of Systems  Constitutional:  Negative for chills and fever.  HENT:  Negative for sore throat.   Eyes:  Negative for redness.  Respiratory:  Negative for cough and shortness of breath.   Cardiovascular:  Positive for chest pain. Negative for leg swelling.  Gastrointestinal:  Negative for abdominal pain, diarrhea and vomiting.  Genitourinary:  Negative for dysuria and flank pain.  Musculoskeletal:  Negative for back pain and neck pain.  Skin:  Negative for rash.  Neurological:  Negative for headaches.  Hematological:  Does not bruise/bleed  easily.  Psychiatric/Behavioral:  Negative for confusion.     Physical Exam Updated Vital Signs BP (!) 170/75 (BP Location: Right Arm)   Pulse (!) 53   Temp 98.2 F (36.8 C) (Oral)   Resp 14   SpO2 100%  Physical Exam Vitals and nursing note reviewed.  Constitutional:      Appearance: Normal appearance. She is well-developed.  HENT:     Head: Atraumatic.     Nose: Nose normal.     Mouth/Throat:     Mouth: Mucous membranes are moist.  Eyes:     General: No scleral icterus.    Conjunctiva/sclera: Conjunctivae normal.  Neck:     Trachea: No tracheal deviation.  Cardiovascular:     Rate and Rhythm: Normal rate and regular rhythm.     Pulses: Normal pulses.     Heart sounds: Normal heart sounds. No murmur heard.    No friction rub. No gallop.  Pulmonary:     Effort: Pulmonary effort is normal. No respiratory distress.     Breath sounds: Normal breath sounds.     Comments: Mild chest wall and left shoulder tenderness anteriorly. No severe pain w passive rom shoulder, no erythema or increased warmth. No sts noted.  Abdominal:     General: Bowel sounds are normal. There is no distension.     Palpations: Abdomen is soft.     Tenderness: There is no abdominal tenderness. There is no guarding.  Genitourinary:    Comments: No cva tenderness.  Musculoskeletal:        General: No swelling or tenderness.     Cervical back: Normal range of motion and neck supple. No rigidity. No muscular tenderness.     Right lower leg: No edema.     Left lower leg: No edema.  Skin:    General: Skin is warm and dry.     Findings: No rash.  Neurological:     Mental Status: She is alert.     Comments: Alert, speech normal.   Psychiatric:        Mood and Affect: Mood normal.     ED Results / Procedures / Treatments   Labs (all labs ordered are listed, but only abnormal results are displayed) Results for orders placed or performed during the hospital encounter of 12/28/21  Comprehensive  metabolic panel  Result Value Ref Range   Sodium 139 135 - 145 mmol/L   Potassium 4.5 3.5 - 5.1 mmol/L   Chloride 104 98 - 111 mmol/L   CO2 21 (L) 22 - 32 mmol/L   Glucose, Bld 111 (H) 70 - 99 mg/dL   BUN 11 8 - 23 mg/dL   Creatinine, Ser 6.57 (H) 0.44 - 1.00 mg/dL   Calcium 84.6 8.9 - 96.2 mg/dL   Total Protein 6.5 6.5 - 8.1  g/dL   Albumin 3.9 3.5 - 5.0 g/dL   AST 27 15 - 41 U/L   ALT 14 0 - 44 U/L   Alkaline Phosphatase 49 38 - 126 U/L   Total Bilirubin 1.0 0.3 - 1.2 mg/dL   GFR, Estimated 45 (L) >60 mL/min   Anion gap 14 5 - 15  CBC  Result Value Ref Range   WBC 5.7 4.0 - 10.5 K/uL   RBC 4.37 3.87 - 5.11 MIL/uL   Hemoglobin 12.4 12.0 - 15.0 g/dL   HCT 29.5 62.1 - 30.8 %   MCV 86.7 80.0 - 100.0 fL   MCH 28.4 26.0 - 34.0 pg   MCHC 32.7 30.0 - 36.0 g/dL   RDW 65.7 (H) 84.6 - 96.2 %   Platelets 198 150 - 400 K/uL   nRBC 0.0 0.0 - 0.2 %  Brain natriuretic peptide  Result Value Ref Range   B Natriuretic Peptide 373.8 (H) 0.0 - 100.0 pg/mL  Troponin I (High Sensitivity)  Result Value Ref Range   Troponin I (High Sensitivity) 16 <18 ng/L   DG Shoulder Left  Result Date: 12/20/2021 CLINICAL DATA:  fall and pain of the left shoulder EXAM: LEFT SHOULDER - 2+ VIEW COMPARISON:  March 10, 2020 FINDINGS: There is no evidence of fracture or dislocation. As before, there are severe degenerative changes in the glenohumeral joint with complete loss of the joint space, subchondral sclerosis and prominent marginal osteophytes. AC joint is well-maintained. Previously described moderate subacromial spur is not conspicuous in the present study. Osteopenia. Soft tissues are unremarkable. IMPRESSION: No fracture or dislocation. Severe degenerative changes in the glenohumeral joint with loss of the joint space, subchondral sclerosis and prominent marginal osteophytes without significant interval change. Electronically Signed   By: Marjo Bicker M.D.   On: 12/20/2021 16:45   CT Head Wo  Contrast  Result Date: 12/20/2021 CLINICAL DATA:  Head trauma.  Fall. EXAM: CT HEAD WITHOUT CONTRAST TECHNIQUE: Contiguous axial images were obtained from the base of the skull through the vertex without intravenous contrast. RADIATION DOSE REDUCTION: This exam was performed according to the departmental dose-optimization program which includes automated exposure control, adjustment of the mA and/or kV according to patient size and/or use of iterative reconstruction technique. COMPARISON:  Head CT 05/18/2019 and MRI 05/19/2019 FINDINGS: Brain: There is no evidence of an acute infarct, intracranial hemorrhage, mass, midline shift, or extra-axial fluid collection. Hypodensities in the cerebral white matter bilaterally are unchanged and nonspecific but compatible with mild chronic small vessel ischemic disease. Small chronic infarcts are again noted in the right thalamus and cerebellum. Generalized cerebral atrophy is within normal limits for age. Vascular: Calcified atherosclerosis at the skull base. No acutely suspicious vessel density. Skull: No fracture or suspicious osseous lesion. Sinuses/Orbits: Visualized paranasal sinuses and mastoid air cells are clear. Bilateral cataract extraction. Other: None. IMPRESSION: 1. No evidence of acute intracranial abnormality. 2. Mild chronic small vessel ischemic disease. Electronically Signed   By: Sebastian Ache M.D.   On: 12/20/2021 13:18   DG Chest 2 View  Result Date: 12/20/2021 CLINICAL DATA:  chest pain EXAM: CHEST - 2 VIEW COMPARISON:  Nov 30, 2021 FINDINGS: Mild cardiomegaly. Extensive atheromatous calcifications with tortuosity of the thoracic aorta. There are prominent interstitial changes seen, which appear stable. Likely bibasilar pleural pulmonary scarring greater on the right. Again seen are the surgical clips at the right hemithorax and the axillary region. Degenerative changes with loss of the glenohumeral joint space of the left side.  IMPRESSION: No active  cardiopulmonary disease. Interstitial changes. Bibasilar pleural pulmonary scarring greater on the right. Electronically Signed   By: Marjo Bicker M.D.   On: 12/20/2021 13:00   DG Knee Complete 4 Views Right  Result Date: 12/20/2021 CLINICAL DATA:  Chronic right knee pain worsened after fall. EXAM: RIGHT KNEE - COMPLETE 4+ VIEW COMPARISON:  No relevant priors available for comparison at time dictation. FINDINGS: No evidence of fracture, dislocation, or joint effusion. Tricompartment degenerative change worse in the medial tibiofemoral compartment. Chondrocalcinosis. Vascular calcifications. IMPRESSION: No acute osseous abnormality. Tricompartment DJD, with chondrocalcinosis as can be seen with CPPD arthropathy. Electronically Signed   By: Maudry Mayhew M.D.   On: 12/20/2021 12:59   ECHOCARDIOGRAM COMPLETE  Result Date: 11/30/2021    ECHOCARDIOGRAM REPORT   Patient Name:   TAMARA DELHOYO Date of Exam: 11/30/2021 Medical Rec #:  [id number]       Height:       66.0 in Accession #:    [id number]      Weight:       158.1 lb Date of Birth:  06/21/1925       BSA:          1.810 m Patient Age:    89 years        BP:           144/63 mmHg Patient Gender: F               HR:           57 bpm. Exam Location:  Inpatient Procedure: 2D Echo, Cardiac Doppler, Color Doppler and Strain Analysis Indications:    CHF  History:        Patient has prior history of Echocardiogram examinations, most                 recent 08/31/2019. CHF, TIA and Stroke, Signs/Symptoms:Murmur;                 Risk Factors:Hypertension and Dyslipidemia.  Sonographer:    Neomia Dear RDCS Referring Phys: [id number] AJAY KADAKIA IMPRESSIONS  1. LVOT is narrow with turbulent outflow Mean gradient through the LVOT / AV is 21 mm Hg. Overall LVEF is normal Septal hypokinesis.. Left ventricular ejection fraction, by estimation, is 60 to 65%. The left ventricle has normal function. The left ventricle has no regional wall motion abnormalities. There is severe  concentric left ventricular hypertrophy. Left ventricular diastolic parameters are consistent with Grade II diastolic dysfunction (pseudonormalization). Elevated left atrial pressure.  2. Right ventricular systolic function is normal. The right ventricular size is normal.  3. Left atrial size was mildly dilated.  4. MV is thickened. Peak and mean gradients through the valve are 11 and 5 mm Hg respectively. MVA by Pt1/2 is 2 cm2 (HR=56 bpm) . Mild to moderate mitral valve regurgitation. Mild mitral stenosis. Moderate mitral annular calcification.  5. AV is thickened calcified Difficult to see well Image 25 (short axis) the leaflets appear to be opening with minimally restricted motion . The aortic valve is tricuspid. Aortic valve regurgitation is not visualized.  6. The inferior vena cava is normal in size with greater than 50% respiratory variability, suggesting right atrial pressure of 3 mmHg. FINDINGS  Left Ventricle: LVOT is narrow with turbulent outflow Mean gradient through the LVOT / AV is 21 mm Hg. Overall LVEF is normal Septal hypokinesis. Left ventricular ejection fraction, by estimation, is 60 to 65%. The left ventricle has normal function. The  left ventricle has no regional wall motion abnormalities. The left ventricular internal cavity size was small. There is severe concentric left ventricular hypertrophy. Left ventricular diastolic parameters are consistent with Grade II diastolic dysfunction (pseudonormalization). Elevated left atrial pressure. Right Ventricle: The right ventricular size is normal. Right vetricular wall thickness was not assessed. Right ventricular systolic function is normal. Left Atrium: Left atrial size was mildly dilated. Right Atrium: Right atrial size was normal in size. Pericardium: There is no evidence of pericardial effusion. Mitral Valve: MV is thickened. Peak and mean gradients through the valve are 11 and 5 mm Hg respectively. MVA by Pt1/2 is 2 cm2 (HR=56 bpm). There is  mild thickening of the mitral valve leaflet(s). Moderate mitral annular calcification. Mild to moderate mitral valve regurgitation. Mild mitral valve stenosis. MV peak gradient, 10.5 mmHg. The mean mitral valve gradient is 5.0 mmHg. Tricuspid Valve: The tricuspid valve is normal in structure. Tricuspid valve regurgitation is mild. Aortic Valve: AV is thickened calcified Difficult to see well Image 25 (short axis) the leaflets appear to be opening with minimally restricted motion. The aortic valve is tricuspid. Aortic valve regurgitation is not visualized. Pulmonic Valve: The pulmonic valve was normal in structure. Pulmonic valve regurgitation is not visualized. No evidence of pulmonic stenosis. Aorta: The aortic root and ascending aorta are structurally normal, with no evidence of dilitation. Venous: The inferior vena cava is normal in size with greater than 50% respiratory variability, suggesting right atrial pressure of 3 mmHg. IAS/Shunts: No atrial level shunt detected by color flow Doppler.  LEFT VENTRICLE PLAX 2D LVIDd:         3.10 cm     Diastology LVIDs:         2.10 cm     LV e' medial:    2.25 cm/s LV PW:         1.40 cm     LV E/e' medial:  66.2 LV IVS:        1.80 cm     LV e' lateral:   3.26 cm/s LVOT diam:     1.32 cm     LV E/e' lateral: 45.7 LVOT Area:     1.38 cm  LV Volumes (MOD) LV vol d, MOD A2C: 55.0 ml LV vol d, MOD A4C: 57.5 ml LV vol s, MOD A2C: 20.7 ml LV vol s, MOD A4C: 16.9 ml LV SV MOD A2C:     34.3 ml LV SV MOD A4C:     57.5 ml LV SV MOD BP:      38.5 ml RIGHT VENTRICLE RV Basal diam:  3.40 cm RV Mid diam:    2.20 cm RV S prime:     13.80 cm/s TAPSE (M-mode): 3.0 cm LEFT ATRIUM             Index        RIGHT ATRIUM           Index LA diam:        2.60 cm 1.44 cm/m   RA Area:     14.80 cm LA Vol (A2C):   86.0 ml 47.53 ml/m  RA Volume:   29.90 ml  16.52 ml/m LA Vol (A4C):   69.0 ml 38.13 ml/m LA Biplane Vol: 83.8 ml 46.31 ml/m  AORTIC VALVE              PULMONIC VALVE AV Vmean:     195.000 cm/s PV Vmax:       1.04 m/s AV VTI:  0.580 m      PV Vmean:      67.567 cm/s                           PV VTI:        0.213 m AORTA                     PV Peak grad:  4.3 mmHg Ao Root diam: 3.00 cm     PV Mean grad:  2.3 mmHg Ao Asc diam:  3.50 cm  MITRAL VALVE                  TRICUSPID VALVE MV Area (PHT): 1.83 cm       TR Peak grad:   37.9 mmHg MV Peak grad:  10.5 mmHg      TR Vmax:        308.00 cm/s MV Mean grad:  5.0 mmHg MV Vmax:       1.62 m/s       SHUNTS MV Vmean:      108.0 cm/s     Systemic Diam: 1.32 cm MV Decel Time: 415 msec MR Peak grad:    235.9 mmHg MR Mean grad:    126.0 mmHg MR Vmax:         768.00 cm/s MR Vmean:        504.0 cm/s MR PISA:         0.57 cm MR PISA Eff ROA: 2 mm MR PISA Radius:  0.30 cm MV E velocity: 149.00 cm/s MV A velocity: 120.00 cm/s MV E/A ratio:  1.24 Dietrich Pates MD Electronically signed by Dietrich Pates MD Signature Date/Time: 11/30/2021/2:04:46 PM    Final    DG Chest 2 View  Result Date: 11/30/2021 CLINICAL DATA:  Chest pain. EXAM: CHEST - 2 VIEW COMPARISON:  Portable chest yesterday at 2:25 p.m. FINDINGS: 4:54 a.m., 11/30/2021. AP and lateral views are provided. There is moderate cardiomegaly. The aorta is tortuous and heavily calcified with stable mediastinum. The central vessels are normal in caliber. There is chronic subpleural reticulation of the lungs without appreciable focal infiltrates. No pleural effusion is seen. Thoracic cage is intact with chronic healed fracture deformity midshaft right clavicle and bilateral shoulder DJD. There is mild dextroscoliosis and multilevel degenerative disc disease of the thoracic spine. Right chest wall surgical clips are again shown. IMPRESSION: No new abnormality. There is cardiomegaly without findings of acute CHF. There is aortic atherosclerosis. The lungs demonstrate chronic changes without a visible focal infiltrate. Electronically Signed   By: Almira Bar M.D.   On: 11/30/2021 07:21   DG Chest Portable  1 View  Result Date: 11/29/2021 CLINICAL DATA:  Chest pain. EXAM: PORTABLE CHEST 1 VIEW COMPARISON:  AP chest 11/26/2021 chest two views 02/22/2017 FINDINGS: Cardiac silhouette is again moderately enlarged. Mediastinal contours are within normal limits. Moderate calcification within the aortic arch. Unchanged likely mild bibasilar linear subsegmental atelectasis versus scarring. No large pleural effusion is seen. No pneumothorax. Mild dextrocurvature of the midthoracic spine with multilevel degenerative disc changes. Extensive right axillary and more inferior anterolateral right chest wall surgical clips. IMPRESSION: No significant change from prior. Mild bibasilar linear subsegmental atelectasis versus scarring. Electronically Signed   By: Neita Garnet M.D.   On: 11/29/2021 15:07     EKG EKG Interpretation  Date/Time:  Tuesday December 28 2021 09:40:05 EDT Ventricular Rate:  54 PR Interval:  31 QRS Duration:  164 QT Interval:  522 QTC Calculation: 495 R Axis:   -28 Text Interpretation: Sinus rhythm Left bundle branch block No significant change since last tracing Confirmed by Cathren Laine ([id number]) on 12/28/2021 9:49:42 AM  Radiology DG Chest Port 1 View  Result Date: 12/28/2021 CLINICAL DATA:  Chest pain. Heart racing. Shortness of breath and LEFT shoulder pain. EXAM: PORTABLE CHEST 1 VIEW COMPARISON:  12/20/2021 FINDINGS: Shallow lung inflation. Heart size is enlarged and stable in configuration. There patchy areas of atelectasis in both lung bases, similar over prior studies. No new consolidations. No pulmonary edema. Stable postoperative changes on the RIGHT. There is dense atherosclerotic calcification of the thoracic aorta. Remote fracture of the RIGHT clavicle and ribs. IMPRESSION: 1. Low lung volumes. 2. Stable cardiomegaly and bibasilar atelectasis. Electronically Signed   By: Norva Pavlov M.D.   On: 12/28/2021 10:07    Procedures Procedures    Medications Ordered in ED Medications  - No data to display  ED Course/ Medical Decision Making/ A&P                           Medical Decision Making Problems Addressed: Acute pain of left shoulder: acute illness or injury Essential hypertension: chronic illness or injury with exacerbation, progression, or side effects of treatment that poses a threat to life or bodily functions Precordial chest pain: acute illness or injury with systemic symptoms that poses a threat to life or bodily functions  Amount and/or Complexity of Data Reviewed Independent Historian: EMS    Details: hx External Data Reviewed: notes. Labs: ordered. Decision-making details documented in ED Course. Radiology: ordered and independent interpretation performed. Decision-making details documented in ED Course.  Risk OTC drugs. Decision regarding hospitalization.   Iv ns. Continuous pulse ox and cardiac monitoring. Labs ordered/sent. Imaging ordered.   Diff dx includes acs, mi, msk cp, shoulder strain, etc. - dispo decision including potential need for admission if high trop/acs, etc, considered - will get labs and imaging and reassess.  Reviewed nursing notes and prior charts for additional history. External reports reviewed. Several recent ED visits and admission w similar symptoms. Recent shoulder xray negative for fx s/p fall.  Additional history from: EMS.   Cardiac monitor: sinus rhythm, rate 60.   Labs reviewed/interpreted by me - trop normal.  After symptoms for past several hours, felt not c/w acs.   Xrays reviewed/interpreted by me - no pna.   Acetaminophen po.  Recheck, pt lying supine/flat, breathing comfotably, sats 98% on room air currently. No current chest pain. +mild left shoulder tenderness/pain w palp.   Pt with reproducible chest wall/shoulder tenderness. Recent cardiology eval.  Pt currently appears comfortable, and stable for d/c.   Rec pcp f/u.  Return precautions provided.            Final Clinical  Impression(s) / ED Diagnoses Final diagnoses:  None    Rx / DC Orders ED Discharge Orders     None         Cathren Laine, MD 12/28/21 1219

## 2021-12-28 NOTE — ED Notes (Signed)
This RN spoke with pt's dtr Jasmine December, and let her know that pt will be discharged back to facility.

## 2021-12-30 ENCOUNTER — Emergency Department (HOSPITAL_COMMUNITY)
Admission: EM | Admit: 2021-12-30 | Discharge: 2021-12-30 | Disposition: A | Discharge status: Home / Self Care | Payer: Medicare Other | Source: Home / Self Care | Attending: Emergency Medicine | Admitting: Emergency Medicine

## 2021-12-30 ENCOUNTER — Emergency Department (HOSPITAL_COMMUNITY): Payer: Medicare Other

## 2021-12-30 ENCOUNTER — Other Ambulatory Visit: Payer: Self-pay

## 2021-12-30 DIAGNOSIS — M79603 Pain in arm, unspecified: Secondary | ICD-10-CM | POA: Diagnosis not present

## 2021-12-30 DIAGNOSIS — F419 Anxiety disorder, unspecified: Secondary | ICD-10-CM | POA: Diagnosis present

## 2021-12-30 DIAGNOSIS — N183 Chronic kidney disease, stage 3 unspecified: Secondary | ICD-10-CM | POA: Diagnosis not present

## 2021-12-30 DIAGNOSIS — I739 Peripheral vascular disease, unspecified: Secondary | ICD-10-CM | POA: Diagnosis present

## 2021-12-30 DIAGNOSIS — C50912 Malignant neoplasm of unspecified site of left female breast: Secondary | ICD-10-CM | POA: Diagnosis present

## 2021-12-30 DIAGNOSIS — I5032 Chronic diastolic (congestive) heart failure: Secondary | ICD-10-CM | POA: Diagnosis not present

## 2021-12-30 DIAGNOSIS — I447 Left bundle-branch block, unspecified: Secondary | ICD-10-CM | POA: Diagnosis not present

## 2021-12-30 DIAGNOSIS — M25561 Pain in right knee: Secondary | ICD-10-CM | POA: Diagnosis not present

## 2021-12-30 DIAGNOSIS — Z9011 Acquired absence of right breast and nipple: Secondary | ICD-10-CM | POA: Diagnosis not present

## 2021-12-30 DIAGNOSIS — Z96652 Presence of left artificial knee joint: Secondary | ICD-10-CM | POA: Diagnosis present

## 2021-12-30 DIAGNOSIS — N6320 Unspecified lump in the left breast, unspecified quadrant: Secondary | ICD-10-CM | POA: Diagnosis not present

## 2021-12-30 DIAGNOSIS — Z79890 Hormone replacement therapy (postmenopausal): Secondary | ICD-10-CM | POA: Diagnosis not present

## 2021-12-30 DIAGNOSIS — E039 Hypothyroidism, unspecified: Secondary | ICD-10-CM | POA: Diagnosis not present

## 2021-12-30 DIAGNOSIS — R002 Palpitations: Secondary | ICD-10-CM | POA: Insufficient documentation

## 2021-12-30 DIAGNOSIS — M79621 Pain in right upper arm: Secondary | ICD-10-CM | POA: Diagnosis not present

## 2021-12-30 DIAGNOSIS — Z7401 Bed confinement status: Secondary | ICD-10-CM | POA: Diagnosis not present

## 2021-12-30 DIAGNOSIS — Z8673 Personal history of transient ischemic attack (TIA), and cerebral infarction without residual deficits: Secondary | ICD-10-CM | POA: Diagnosis not present

## 2021-12-30 DIAGNOSIS — N390 Urinary tract infection, site not specified: Secondary | ICD-10-CM | POA: Diagnosis not present

## 2021-12-30 DIAGNOSIS — Z853 Personal history of malignant neoplasm of breast: Secondary | ICD-10-CM | POA: Diagnosis not present

## 2021-12-30 DIAGNOSIS — Z91148 Patient's other noncompliance with medication regimen for other reason: Secondary | ICD-10-CM | POA: Diagnosis not present

## 2021-12-30 DIAGNOSIS — Z86711 Personal history of pulmonary embolism: Secondary | ICD-10-CM | POA: Diagnosis not present

## 2021-12-30 DIAGNOSIS — G459 Transient cerebral ischemic attack, unspecified: Secondary | ICD-10-CM | POA: Diagnosis not present

## 2021-12-30 DIAGNOSIS — R6889 Other general symptoms and signs: Secondary | ICD-10-CM | POA: Diagnosis not present

## 2021-12-30 DIAGNOSIS — R079 Chest pain, unspecified: Secondary | ICD-10-CM | POA: Diagnosis not present

## 2021-12-30 DIAGNOSIS — G9341 Metabolic encephalopathy: Secondary | ICD-10-CM | POA: Diagnosis not present

## 2021-12-30 DIAGNOSIS — J42 Unspecified chronic bronchitis: Secondary | ICD-10-CM | POA: Diagnosis present

## 2021-12-30 DIAGNOSIS — I130 Hypertensive heart and chronic kidney disease with heart failure and stage 1 through stage 4 chronic kidney disease, or unspecified chronic kidney disease: Secondary | ICD-10-CM | POA: Diagnosis not present

## 2021-12-30 DIAGNOSIS — K589 Irritable bowel syndrome without diarrhea: Secondary | ICD-10-CM | POA: Diagnosis present

## 2021-12-30 DIAGNOSIS — K219 Gastro-esophageal reflux disease without esophagitis: Secondary | ICD-10-CM | POA: Diagnosis not present

## 2021-12-30 DIAGNOSIS — Z79899 Other long term (current) drug therapy: Secondary | ICD-10-CM | POA: Diagnosis not present

## 2021-12-30 DIAGNOSIS — N3000 Acute cystitis without hematuria: Secondary | ICD-10-CM | POA: Diagnosis not present

## 2021-12-30 DIAGNOSIS — I10 Essential (primary) hypertension: Secondary | ICD-10-CM | POA: Insufficient documentation

## 2021-12-30 DIAGNOSIS — G319 Degenerative disease of nervous system, unspecified: Secondary | ICD-10-CM | POA: Diagnosis not present

## 2021-12-30 DIAGNOSIS — R0789 Other chest pain: Secondary | ICD-10-CM | POA: Diagnosis not present

## 2021-12-30 DIAGNOSIS — Z7902 Long term (current) use of antithrombotics/antiplatelets: Secondary | ICD-10-CM | POA: Diagnosis not present

## 2021-12-30 DIAGNOSIS — R4182 Altered mental status, unspecified: Secondary | ICD-10-CM | POA: Diagnosis not present

## 2021-12-30 DIAGNOSIS — R1110 Vomiting, unspecified: Secondary | ICD-10-CM | POA: Diagnosis not present

## 2021-12-30 DIAGNOSIS — R222 Localized swelling, mass and lump, trunk: Secondary | ICD-10-CM | POA: Diagnosis not present

## 2021-12-30 DIAGNOSIS — M79622 Pain in left upper arm: Secondary | ICD-10-CM | POA: Diagnosis not present

## 2021-12-30 DIAGNOSIS — N1831 Chronic kidney disease, stage 3a: Secondary | ICD-10-CM | POA: Diagnosis not present

## 2021-12-30 DIAGNOSIS — R410 Disorientation, unspecified: Secondary | ICD-10-CM | POA: Diagnosis not present

## 2021-12-30 DIAGNOSIS — R0602 Shortness of breath: Secondary | ICD-10-CM | POA: Diagnosis not present

## 2021-12-30 DIAGNOSIS — Z743 Need for continuous supervision: Secondary | ICD-10-CM | POA: Diagnosis not present

## 2021-12-30 DIAGNOSIS — I480 Paroxysmal atrial fibrillation: Secondary | ICD-10-CM | POA: Diagnosis not present

## 2021-12-30 LAB — I-STAT CHEM 8, ED
BUN: 14 mg/dL (ref 8–23)
Calcium, Ion: 1.16 mmol/L (ref 1.15–1.40)
Chloride: 104 mmol/L (ref 98–111)
Creatinine, Ser: 1 mg/dL (ref 0.44–1.00)
Glucose, Bld: 104 mg/dL — ABNORMAL HIGH (ref 70–99)
HCT: 40 % (ref 36.0–46.0)
Hemoglobin: 13.6 g/dL (ref 12.0–15.0)
Potassium: 3.8 mmol/L (ref 3.5–5.1)
Sodium: 138 mmol/L (ref 135–145)
TCO2: 22 mmol/L (ref 22–32)

## 2021-12-30 LAB — BASIC METABOLIC PANEL
Anion gap: 9 (ref 5–15)
BUN: 13 mg/dL (ref 8–23)
CO2: 22 mmol/L (ref 22–32)
Calcium: 9.4 mg/dL (ref 8.9–10.3)
Chloride: 107 mmol/L (ref 98–111)
Creatinine, Ser: 1.12 mg/dL — ABNORMAL HIGH (ref 0.44–1.00)
GFR, Estimated: 46 mL/min — ABNORMAL LOW (ref >60)
Glucose, Bld: 108 mg/dL — ABNORMAL HIGH (ref 70–99)
Potassium: 4 mmol/L (ref 3.5–5.1)
Sodium: 138 mmol/L (ref 135–145)

## 2021-12-30 LAB — CBC
HCT: 39.5 % (ref 36.0–46.0)
Hemoglobin: 12.4 g/dL (ref 12.0–15.0)
MCH: 27.4 pg (ref 26.0–34.0)
MCHC: 31.4 g/dL (ref 30.0–36.0)
MCV: 87.2 fL (ref 80.0–100.0)
Platelets: 217 10*3/uL (ref 150–400)
RBC: 4.53 MIL/uL (ref 3.87–5.11)
RDW: 17.7 % — ABNORMAL HIGH (ref 11.5–15.5)
WBC: 5.7 10*3/uL (ref 4.0–10.5)
nRBC: 0 % (ref 0.0–0.2)

## 2021-12-30 LAB — PROTIME-INR
INR: 1 (ref 0.8–1.2)
Prothrombin Time: 12.9 seconds (ref 11.4–15.2)

## 2021-12-30 LAB — TROPONIN I (HIGH SENSITIVITY)
Troponin I (High Sensitivity): 16 ng/L (ref <18)
Troponin I (High Sensitivity): 16 ng/L (ref <18)

## 2021-12-30 LAB — CBG MONITORING, ED: Glucose-Capillary: 124 mg/dL — ABNORMAL HIGH (ref 70–99)

## 2021-12-30 MED ORDER — HYDRALAZINE HCL 10 MG PO TABS
10.0000 mg | ORAL_TABLET | Freq: Once | ORAL | Status: AC
  Administered 2021-12-30: 10 mg via ORAL
  Filled 2021-12-30: qty 1

## 2021-12-30 MED ORDER — LOSARTAN POTASSIUM 50 MG PO TABS
25.0000 mg | ORAL_TABLET | Freq: Once | ORAL | Status: AC
Start: 2021-12-30 — End: 2021-12-30
  Administered 2021-12-30: 25 mg via ORAL
  Filled 2021-12-30: qty 1

## 2021-12-30 MED ORDER — NITROGLYCERIN 2 % TD OINT
1.0000 [in_us] | TOPICAL_OINTMENT | Freq: Once | TRANSDERMAL | Status: AC
Start: 2021-12-30 — End: 2021-12-30
  Administered 2021-12-30: 1 [in_us] via TOPICAL
  Filled 2021-12-30: qty 1

## 2021-12-30 NOTE — ED Provider Notes (Signed)
[facility] EMERGENCY DEPARTMENT Provider Note   CSN: [id number] Arrival date & time: 12/30/21  0915     History  Chief Complaint  Patient presents with   Chest Pain    [name] is a 88 y.o. female.   Chest Pain     Patient presents to the ED for evaluation of chest pain.  Patient has history of anxiety, hypertension, GERD, gastritis, PE, stroke, who presents to the ED with complaints of chest pain.  Patient called EMS this morning because she was having palpitations and chest pressure.  Patient was given 1 nitroglycerin Home Medications Prior to Admission medications   Medication Sig Start Date End Date Taking? Authorizing Provider  acetaminophen (TYLENOL) 500 MG tablet Take 1,000 mg by mouth every 6 (six) hours as needed for headache (pain).   Yes [provider]  amiodarone (PACERONE) 200 MG tablet Take 200 mg by mouth daily. 12/11/21  Yes [provider]  amLODipine (NORVASC) 5 MG tablet Take 1 tablet (5 mg total) by mouth daily. 05/19/19  Yes Domenic Polite, MD  atorvastatin (LIPITOR) 40 MG tablet Take 1 tablet (40 mg total) by mouth daily at 6 PM. 09/02/19  Yes Dixie Dials, MD  clopidogrel (PLAVIX) 75 MG tablet Take 1 tablet (75 mg total) by mouth daily. 09/02/19  Yes Dixie Dials, MD  diclofenac sodium (VOLTAREN) 1 % GEL APPLY 2-4 GRAMS TO AFFECTED AREA 2-3 TIMES A DAY AS NEEDED. Patient taking differently: Apply 2-4 g topically 2 (two) times daily. 04/30/19  Yes Meredith Pel, MD  esomeprazole (NEXIUM) 40 MG capsule Take 40 mg by mouth daily before breakfast.    Yes [provider]  furosemide (LASIX) 40 MG tablet Take 0.5 tablets (20 mg total) by mouth every Monday, Wednesday, and Friday. 12/03/21  Yes Dixie Dials, MD  hydrALAZINE (APRESOLINE) 10 MG tablet Take 1 tablet (10 mg total) by mouth 2 (two) times daily. 12/02/21  Yes Dixie Dials, MD  isosorbide mononitrate (IMDUR) 30 MG 24 hr tablet Take 1 tablet (30 mg  total) by mouth daily. 09/02/19  Yes Dixie Dials, MD  levothyroxine (SYNTHROID) 50 MCG tablet Take 50 mcg by mouth daily. 03/04/19  Yes [provider]  losartan (COZAAR) 25 MG tablet Take 1 tablet (25 mg total) by mouth daily. 09/03/19  Yes Dixie Dials, MD  Melatonin 3 MG TABS Take 3 mg by mouth at bedtime as needed (sleep).    Yes [provider]  metoprolol tartrate (LOPRESSOR) 25 MG tablet Take 25 mg by mouth 2 (two) times daily. 12/11/21  Yes [provider]  nitroGLYCERIN (NITROSTAT) 0.4 MG SL tablet Place 1 tablet (0.4 mg total) under the tongue every 5 (five) minutes x 3 doses as needed for chest pain. 09/02/19  Yes Dixie Dials, MD  Polyethyl Glycol-Propyl Glycol (SYSTANE) 0.4-0.3 % GEL ophthalmic gel Place 1 application into both eyes at bedtime.   Yes [provider]  potassium chloride SA (KLOR-CON M20) 20 MEQ tablet Take 1 tablet (20 mEq total) by mouth every Monday, Wednesday, and Friday. 12/03/21  Yes Dixie Dials, MD      Allergies    Codeine and Pregabalin    Review of Systems   Review of Systems  Cardiovascular:  Positive for chest pain.    Physical Exam Updated Vital Signs BP (!) 181/74   Pulse 71   Temp 98.2 F (36.8 C) (Oral)   Resp 16   SpO2 100%  Physical Exam Vitals and nursing  note reviewed.  Constitutional:      General: She is not in acute distress.    Appearance: She is well-developed.  HENT:     Head: Normocephalic and atraumatic.     Right Ear: External ear normal.     Left Ear: External ear normal.  Eyes:     General: No scleral icterus.       Right eye: No discharge.        Left eye: No discharge.     Conjunctiva/sclera: Conjunctivae normal.  Neck:     Trachea: No tracheal deviation.  Cardiovascular:     Rate and Rhythm: Normal rate and regular rhythm.  Pulmonary:     Effort: Pulmonary effort is normal. No respiratory distress.     Breath sounds: Normal breath sounds. No stridor. No wheezing or rales.   Abdominal:     General: Bowel sounds are normal. There is no distension.     Palpations: Abdomen is soft.     Tenderness: There is no abdominal tenderness. There is no guarding or rebound.  Musculoskeletal:        General: No tenderness or deformity.     Cervical back: Neck supple.  Skin:    General: Skin is warm and dry.     Findings: No rash.  Neurological:     General: No focal deficit present.     Mental Status: She is alert.     Cranial Nerves: No cranial nerve deficit (no facial droop, extraocular movements intact, no slurred speech).     Sensory: No sensory deficit.     Motor: No abnormal muscle tone or seizure activity.     Coordination: Coordination normal.  Psychiatric:        Mood and Affect: Mood normal.     ED Results / Procedures / Treatments   Labs (all labs ordered are listed, but only abnormal results are displayed) Labs Reviewed  BASIC METABOLIC PANEL - Abnormal; Notable for the following components:      Result Value   Glucose, Bld 108 (*)    Creatinine, Ser 1.12 (*)    GFR, Estimated 46 (*)    All other components within normal limits  CBC - Abnormal; Notable for the following components:   RDW 17.7 (*)    All other components within normal limits  CBG MONITORING, ED - Abnormal; Notable for the following components:   Glucose-Capillary 124 (*)    All other components within normal limits  I-STAT CHEM 8, ED - Abnormal; Notable for the following components:   Glucose, Bld 104 (*)    All other components within normal limits  PROTIME-INR  TROPONIN I (HIGH SENSITIVITY)  TROPONIN I (HIGH SENSITIVITY)    EKG EKG Interpretation  Date/Time:  Thursday December 30 2021 09:29:04 EDT Ventricular Rate:  56 PR Interval:  249 QRS Duration: 167 QT Interval:  519 QTC Calculation: 501 R Axis:   -57 Text Interpretation: Sinus rhythm Prolonged PR interval Left bundle branch block No significant change since last tracing Confirmed by Dorie Rank [phone]) on  12/30/2021 10:03:23 AM  Radiology DG Chest Portable 1 View  Result Date: 12/30/2021 CLINICAL DATA:  Chest pressure.  Palpitations. EXAM: PORTABLE CHEST 1 VIEW COMPARISON:  12/28/2021 FINDINGS: Right axillary node dissection. Suspect remote right rib fractures. Patient rotated to the right. Moderate cardiomegaly. Atherosclerosis in the transverse aorta. No pleural effusion or pneumothorax. Increased density at the lung bases is likely due to scarring and interstitial thickening, nonspecific in this age group. No  congestive failure. No well-defined lobar consolidation. IMPRESSION: Cardiomegaly and nonspecific interstitial thickening. No acute findings. Aortic Atherosclerosis (ICD10-I70.0). Electronically Signed   By: Abigail Miyamoto M.D.   On: 12/30/2021 09:40    Procedures Procedures    Medications Ordered in ED Medications  nitroGLYCERIN (NITROGLYN) 2 % ointment 1 inch (1 inch Topical Given 12/30/21 1003)  losartan (COZAAR) tablet 25 mg (25 mg Oral Given 12/30/21 1222)  hydrALAZINE (APRESOLINE) tablet 10 mg (10 mg Oral Given 12/30/21 1222)    ED Course/ Medical Decision Making/ A&P Clinical Course as of 12/30/21 1549  Thu Dec 30, 2021  1004 EMS EKG reviewed.  ST elevation more prominent on EMS EKG than current EKG [JK]  1120 Chest x-ray images and radiology report reviewed.  No acute findings [JK]  1120 CBC(!) Normal [JK]  1121 Protime-INR Normal [JK]  1121 I-stat chem 8, ED(!) No significant electrolyte abnormalities [JK]  1141 Troponin I (High Sensitivity) Initial troponin normal at 16 [JK]  1508 Troponin I (High Sensitivity) Delta troponin unchanged [JK]    Clinical Course User Index [JK] Dorie Rank, MD           HEART Score: 5                Medical Decision Making Problems Addressed: Chest pain, unspecified type: complicated acute illness or injury Hypertension, unspecified type: chronic illness or injury with exacerbation, progression, or side effects of treatment  Amount  and/or Complexity of Data Reviewed Labs: ordered. Decision-making details documented in ED Course. Radiology: ordered.  Risk Prescription drug management.   Patient presented to the ED for evaluation of chest pain.  ED work-up reassuring.  No signs of pneumonia.  Serial troponins normal.  Patient was noted to be hypertensive.  She was given overdose of her home medications.  Doubt ACS at this time.  No signs of pneumonia.  Patient improved with treatment in the ED.  She is able to eat and drink without difficulty.  Patient was seen in the emergency room on June 19 for fall.  She was seen 2 days ago for chest pain.  It is possible the symptoms may be musculoskeletal in nature  Evaluation and diagnostic testing in the emergency department does not suggest an emergent condition requiring admission or immediate intervention beyond what has been performed at this time.  The patient is safe for discharge and has been instructed to return immediately for worsening symptoms, change in symptoms or any other concerns.         Final Clinical Impression(s) / ED Diagnoses Final diagnoses:  Chest pain, unspecified type  Hypertension, unspecified type    Rx / DC Orders ED Discharge Orders     None         Dorie Rank, MD 12/30/21 1550

## 2021-12-30 NOTE — ED Notes (Signed)
D/c delay d/t waiting on transport

## 2021-12-30 NOTE — Discharge Instructions (Signed)
Continue your medications.  Follow-up with your primary care doctor to be rechecked.  Return to the ER as needed for worsening symptoms

## 2021-12-30 NOTE — ED Notes (Signed)
[facility] was called, 7 on list

## 2021-12-30 NOTE — ED Triage Notes (Signed)
[facility] reports called for palpitations and chest pressure. Found to be brady at 58 with wide qrs. 1 nitro and 324 asa given, some relief with meds. 20g LAC. From [facility].

## 2021-12-30 NOTE — ED Notes (Signed)
Called [facility] to given report. Was advised the pt is in independent living and there is no nurse to give report too but phone operator will see her when she gets back.

## 2022-01-01 ENCOUNTER — Encounter (HOSPITAL_COMMUNITY): Payer: Self-pay | Admitting: Emergency Medicine

## 2022-01-01 ENCOUNTER — Emergency Department (HOSPITAL_COMMUNITY): Payer: Medicare Other

## 2022-01-01 ENCOUNTER — Inpatient Hospital Stay (HOSPITAL_COMMUNITY)
Admission: EM | Admit: 2022-01-01 | Discharge: 2022-01-03 | DRG: 689 | Disposition: A | Discharge status: Home Health | Payer: Medicare Other | Source: Skilled Nursing Facility | Attending: Internal Medicine | Admitting: Internal Medicine

## 2022-01-01 ENCOUNTER — Other Ambulatory Visit: Payer: Self-pay

## 2022-01-01 DIAGNOSIS — I10 Essential (primary) hypertension: Secondary | ICD-10-CM | POA: Diagnosis present

## 2022-01-01 DIAGNOSIS — R6889 Other general symptoms and signs: Secondary | ICD-10-CM | POA: Diagnosis not present

## 2022-01-01 DIAGNOSIS — Z79899 Other long term (current) drug therapy: Secondary | ICD-10-CM

## 2022-01-01 DIAGNOSIS — G319 Degenerative disease of nervous system, unspecified: Secondary | ICD-10-CM | POA: Diagnosis not present

## 2022-01-01 DIAGNOSIS — Z853 Personal history of malignant neoplasm of breast: Secondary | ICD-10-CM | POA: Diagnosis not present

## 2022-01-01 DIAGNOSIS — R079 Chest pain, unspecified: Secondary | ICD-10-CM | POA: Diagnosis present

## 2022-01-01 DIAGNOSIS — R222 Localized swelling, mass and lump, trunk: Secondary | ICD-10-CM | POA: Diagnosis present

## 2022-01-01 DIAGNOSIS — M79603 Pain in arm, unspecified: Secondary | ICD-10-CM | POA: Diagnosis not present

## 2022-01-01 DIAGNOSIS — Z8249 Family history of ischemic heart disease and other diseases of the circulatory system: Secondary | ICD-10-CM

## 2022-01-01 DIAGNOSIS — N179 Acute kidney failure, unspecified: Secondary | ICD-10-CM | POA: Diagnosis present

## 2022-01-01 DIAGNOSIS — J42 Unspecified chronic bronchitis: Secondary | ICD-10-CM | POA: Diagnosis present

## 2022-01-01 DIAGNOSIS — G9341 Metabolic encephalopathy: Secondary | ICD-10-CM | POA: Diagnosis present

## 2022-01-01 DIAGNOSIS — F419 Anxiety disorder, unspecified: Secondary | ICD-10-CM | POA: Diagnosis present

## 2022-01-01 DIAGNOSIS — R0789 Other chest pain: Secondary | ICD-10-CM | POA: Diagnosis not present

## 2022-01-01 DIAGNOSIS — R4182 Altered mental status, unspecified: Secondary | ICD-10-CM

## 2022-01-01 DIAGNOSIS — M79621 Pain in right upper arm: Secondary | ICD-10-CM | POA: Diagnosis not present

## 2022-01-01 DIAGNOSIS — Z9011 Acquired absence of right breast and nipple: Secondary | ICD-10-CM

## 2022-01-01 DIAGNOSIS — Z885 Allergy status to narcotic agent status: Secondary | ICD-10-CM

## 2022-01-01 DIAGNOSIS — K219 Gastro-esophageal reflux disease without esophagitis: Secondary | ICD-10-CM | POA: Diagnosis present

## 2022-01-01 DIAGNOSIS — N390 Urinary tract infection, site not specified: Secondary | ICD-10-CM | POA: Diagnosis not present

## 2022-01-01 DIAGNOSIS — K589 Irritable bowel syndrome without diarrhea: Secondary | ICD-10-CM | POA: Diagnosis present

## 2022-01-01 DIAGNOSIS — R0602 Shortness of breath: Secondary | ICD-10-CM | POA: Diagnosis not present

## 2022-01-01 DIAGNOSIS — N6320 Unspecified lump in the left breast, unspecified quadrant: Secondary | ICD-10-CM | POA: Diagnosis present

## 2022-01-01 DIAGNOSIS — E039 Hypothyroidism, unspecified: Secondary | ICD-10-CM | POA: Diagnosis not present

## 2022-01-01 DIAGNOSIS — Z96652 Presence of left artificial knee joint: Secondary | ICD-10-CM | POA: Diagnosis present

## 2022-01-01 DIAGNOSIS — Z743 Need for continuous supervision: Secondary | ICD-10-CM | POA: Diagnosis not present

## 2022-01-01 DIAGNOSIS — M79622 Pain in left upper arm: Secondary | ICD-10-CM | POA: Diagnosis not present

## 2022-01-01 DIAGNOSIS — Z7902 Long term (current) use of antithrombotics/antiplatelets: Secondary | ICD-10-CM

## 2022-01-01 DIAGNOSIS — Z86711 Personal history of pulmonary embolism: Secondary | ICD-10-CM

## 2022-01-01 DIAGNOSIS — N1831 Chronic kidney disease, stage 3a: Secondary | ICD-10-CM | POA: Diagnosis present

## 2022-01-01 DIAGNOSIS — I739 Peripheral vascular disease, unspecified: Secondary | ICD-10-CM | POA: Diagnosis present

## 2022-01-01 DIAGNOSIS — Z91148 Patient's other noncompliance with medication regimen for other reason: Secondary | ICD-10-CM

## 2022-01-01 DIAGNOSIS — C50912 Malignant neoplasm of unspecified site of left female breast: Secondary | ICD-10-CM | POA: Diagnosis present

## 2022-01-01 DIAGNOSIS — M25561 Pain in right knee: Secondary | ICD-10-CM | POA: Diagnosis not present

## 2022-01-01 DIAGNOSIS — I5032 Chronic diastolic (congestive) heart failure: Secondary | ICD-10-CM | POA: Diagnosis not present

## 2022-01-01 DIAGNOSIS — I130 Hypertensive heart and chronic kidney disease with heart failure and stage 1 through stage 4 chronic kidney disease, or unspecified chronic kidney disease: Secondary | ICD-10-CM | POA: Diagnosis not present

## 2022-01-01 DIAGNOSIS — Z8673 Personal history of transient ischemic attack (TIA), and cerebral infarction without residual deficits: Secondary | ICD-10-CM

## 2022-01-01 DIAGNOSIS — N183 Chronic kidney disease, stage 3 unspecified: Secondary | ICD-10-CM

## 2022-01-01 DIAGNOSIS — I480 Paroxysmal atrial fibrillation: Secondary | ICD-10-CM | POA: Diagnosis present

## 2022-01-01 DIAGNOSIS — Z90710 Acquired absence of both cervix and uterus: Secondary | ICD-10-CM

## 2022-01-01 DIAGNOSIS — N3000 Acute cystitis without hematuria: Secondary | ICD-10-CM | POA: Diagnosis not present

## 2022-01-01 DIAGNOSIS — Z888 Allergy status to other drugs, medicaments and biological substances status: Secondary | ICD-10-CM

## 2022-01-01 DIAGNOSIS — Z79890 Hormone replacement therapy (postmenopausal): Secondary | ICD-10-CM

## 2022-01-01 LAB — CBC
HCT: 41.7 % (ref 36.0–46.0)
HCT: 40.4 % (ref 36.0–46.0)
Hemoglobin: 13.5 g/dL (ref 12.0–15.0)
Hemoglobin: 12.7 g/dL (ref 12.0–15.0)
MCH: 28.1 pg (ref 26.0–34.0)
MCH: 27.7 pg (ref 26.0–34.0)
MCHC: 32.4 g/dL (ref 30.0–36.0)
MCHC: 31.4 g/dL (ref 30.0–36.0)
MCV: 86.7 fL (ref 80.0–100.0)
MCV: 88 fL (ref 80.0–100.0)
Platelets: 224 10*3/uL (ref 150–400)
Platelets: 208 10*3/uL (ref 150–400)
RBC: 4.81 MIL/uL (ref 3.87–5.11)
RBC: 4.59 MIL/uL (ref 3.87–5.11)
RDW: 17.8 % — ABNORMAL HIGH (ref 11.5–15.5)
RDW: 17.9 % — ABNORMAL HIGH (ref 11.5–15.5)
WBC: 6.7 10*3/uL (ref 4.0–10.5)
WBC: 6.2 10*3/uL (ref 4.0–10.5)
nRBC: 0 % (ref 0.0–0.2)
nRBC: 0 % (ref 0.0–0.2)

## 2022-01-01 LAB — URINALYSIS, ROUTINE W REFLEX MICROSCOPIC
Bilirubin Urine: NEGATIVE
Glucose, UA: NEGATIVE mg/dL
Hgb urine dipstick: NEGATIVE
Ketones, ur: NEGATIVE mg/dL
Nitrite: POSITIVE — AB
Protein, ur: NEGATIVE mg/dL
Specific Gravity, Urine: 1.013 (ref 1.005–1.030)
WBC, UA: >50 WBC/hpf — ABNORMAL HIGH (ref 0–5)
pH: 8 (ref 5.0–8.0)

## 2022-01-01 LAB — CREATININE, SERUM
Creatinine, Ser: 1.01 mg/dL — ABNORMAL HIGH (ref 0.44–1.00)
GFR, Estimated: 52 mL/min — ABNORMAL LOW (ref >60)

## 2022-01-01 LAB — HEPATIC FUNCTION PANEL
ALT: 16 U/L (ref 0–44)
AST: 20 U/L (ref 15–41)
Albumin: 3.5 g/dL (ref 3.5–5.0)
Alkaline Phosphatase: 45 U/L (ref 38–126)
Bilirubin, Direct: 0.2 mg/dL (ref 0.0–0.2)
Indirect Bilirubin: 0.6 mg/dL (ref 0.3–0.9)
Total Bilirubin: 0.8 mg/dL (ref 0.3–1.2)
Total Protein: 6.3 g/dL — ABNORMAL LOW (ref 6.5–8.1)

## 2022-01-01 LAB — BASIC METABOLIC PANEL
Anion gap: 12 (ref 5–15)
BUN: 15 mg/dL (ref 8–23)
CO2: 22 mmol/L (ref 22–32)
Calcium: 10.2 mg/dL (ref 8.9–10.3)
Chloride: 101 mmol/L (ref 98–111)
Creatinine, Ser: 1.13 mg/dL — ABNORMAL HIGH (ref 0.44–1.00)
GFR, Estimated: 45 mL/min — ABNORMAL LOW (ref >60)
Glucose, Bld: 108 mg/dL — ABNORMAL HIGH (ref 70–99)
Potassium: 3.9 mmol/L (ref 3.5–5.1)
Sodium: 135 mmol/L (ref 135–145)

## 2022-01-01 LAB — TROPONIN I (HIGH SENSITIVITY)
Troponin I (High Sensitivity): 16 ng/L (ref <18)
Troponin I (High Sensitivity): 14 ng/L (ref <18)

## 2022-01-01 LAB — AMMONIA: Ammonia: <10 umol/L (ref 9–35)

## 2022-01-01 LAB — TSH: TSH: 35.399 u[IU]/mL — ABNORMAL HIGH (ref 0.350–4.500)

## 2022-01-01 MED ORDER — ENOXAPARIN SODIUM 40 MG/0.4ML IJ SOSY: 40.0000 mg | PREFILLED_SYRINGE | INTRAMUSCULAR | Status: DC

## 2022-01-01 MED ORDER — METOPROLOL TARTRATE 25 MG PO TABS
25.0000 mg | ORAL_TABLET | Freq: Two times a day (BID) | ORAL | Status: DC
Start: 2022-01-01 — End: 2022-01-03
  Administered 2022-01-01 – 2022-01-03 (×4): 25 mg via ORAL
  Filled 2022-01-01 (×4): qty 1

## 2022-01-01 MED ORDER — FUROSEMIDE 20 MG PO TABS
20.0000 mg | ORAL_TABLET | ORAL | Status: DC
  Administered 2022-01-03: 20 mg via ORAL
  Filled 2022-01-01: qty 1

## 2022-01-01 MED ORDER — LEVOTHYROXINE SODIUM 50 MCG PO TABS
50.0000 ug | ORAL_TABLET | Freq: Every day | ORAL | Status: DC
  Administered 2022-01-02: 50 ug via ORAL
  Filled 2022-01-01: qty 1

## 2022-01-01 MED ORDER — ONDANSETRON HCL 4 MG PO TABS
4.0000 mg | ORAL_TABLET | Freq: Four times a day (QID) | ORAL | Status: DC | PRN
  Administered 2022-01-03: 4 mg via ORAL

## 2022-01-01 MED ORDER — MELATONIN 3 MG PO TABS
3.0000 mg | ORAL_TABLET | Freq: Every evening | ORAL | Status: DC | PRN
  Administered 2022-01-01: 3 mg via ORAL
  Filled 2022-01-01: qty 1

## 2022-01-01 MED ORDER — NITROGLYCERIN 0.4 MG SL SUBL
0.4000 mg | SUBLINGUAL_TABLET | SUBLINGUAL | Status: DC | PRN
Start: 2022-01-01 — End: 2022-01-03

## 2022-01-01 MED ORDER — ONDANSETRON HCL 4 MG/2ML IJ SOLN
4.0000 mg | Freq: Four times a day (QID) | INTRAMUSCULAR | Status: DC | PRN
  Filled 2022-01-01: qty 2

## 2022-01-01 MED ORDER — AMIODARONE HCL 200 MG PO TABS
200.0000 mg | ORAL_TABLET | Freq: Every day | ORAL | Status: DC
  Administered 2022-01-01 – 2022-01-03 (×3): 200 mg via ORAL
  Filled 2022-01-01 (×3): qty 1

## 2022-01-01 MED ORDER — AMLODIPINE BESYLATE 5 MG PO TABS
5.0000 mg | ORAL_TABLET | Freq: Every day | ORAL | Status: DC
  Administered 2022-01-01 – 2022-01-03 (×3): 5 mg via ORAL
  Filled 2022-01-01 (×3): qty 1

## 2022-01-01 MED ORDER — IOHEXOL 350 MG/ML SOLN
60.0000 mL | Freq: Once | INTRAVENOUS | Status: AC | PRN
  Administered 2022-01-01: 60 mL via INTRAVENOUS

## 2022-01-01 MED ORDER — ATORVASTATIN CALCIUM 40 MG PO TABS
40.0000 mg | ORAL_TABLET | Freq: Every day | ORAL | Status: DC
  Administered 2022-01-02: 40 mg via ORAL
  Filled 2022-01-01: qty 1

## 2022-01-01 MED ORDER — PANTOPRAZOLE SODIUM 40 MG PO TBEC
80.0000 mg | DELAYED_RELEASE_TABLET | Freq: Every day | ORAL | Status: DC
  Administered 2022-01-02 – 2022-01-03 (×2): 80 mg via ORAL
  Filled 2022-01-01 (×3): qty 2

## 2022-01-01 MED ORDER — POLYVINYL ALCOHOL 1.4 % OP SOLN
2.0000 [drp] | Freq: Every day | OPHTHALMIC | Status: DC
Start: 2022-01-01 — End: 2022-01-03
  Administered 2022-01-02: 2 [drp] via OPHTHALMIC
  Filled 2022-01-01 (×2): qty 15

## 2022-01-01 MED ORDER — SODIUM CHLORIDE 0.9 % IV SOLN: 1.0000 g | INTRAVENOUS | Status: DC

## 2022-01-01 MED ORDER — SODIUM CHLORIDE 0.9 % IV SOLN
1.0000 g | INTRAVENOUS | Status: DC
  Administered 2022-01-02 – 2022-01-03 (×2): 1 g via INTRAVENOUS
  Filled 2022-01-01 (×2): qty 10

## 2022-01-01 MED ORDER — POLYETHYL GLYCOL-PROPYL GLYCOL 0.4-0.3 % OP GEL
1.0000 | Freq: Every day | OPHTHALMIC | Status: DC
Start: 2022-01-01 — End: 2022-01-01

## 2022-01-01 MED ORDER — ENOXAPARIN SODIUM 40 MG/0.4ML IJ SOSY
40.0000 mg | PREFILLED_SYRINGE | INTRAMUSCULAR | Status: DC
  Administered 2022-01-01 – 2022-01-02 (×2): 40 mg via SUBCUTANEOUS
  Filled 2022-01-01 (×2): qty 0.4

## 2022-01-01 MED ORDER — SODIUM CHLORIDE 0.9 % IV SOLN
1.0000 g | Freq: Once | INTRAVENOUS | Status: AC
  Administered 2022-01-01: 1 g via INTRAVENOUS
  Filled 2022-01-01: qty 10

## 2022-01-01 MED ORDER — ISOSORBIDE MONONITRATE ER 30 MG PO TB24
30.0000 mg | ORAL_TABLET | Freq: Every day | ORAL | Status: DC
  Administered 2022-01-01 – 2022-01-03 (×3): 30 mg via ORAL
  Filled 2022-01-01 (×3): qty 1

## 2022-01-01 MED ORDER — GADOBUTROL 1 MMOL/ML IV SOLN
6.0000 mL | Freq: Once | INTRAVENOUS | Status: AC | PRN
  Administered 2022-01-01: 6 mL via INTRAVENOUS

## 2022-01-01 MED ORDER — CLOPIDOGREL BISULFATE 75 MG PO TABS
75.0000 mg | ORAL_TABLET | Freq: Every day | ORAL | Status: DC
  Administered 2022-01-01 – 2022-01-03 (×3): 75 mg via ORAL
  Filled 2022-01-01 (×3): qty 1

## 2022-01-01 MED ORDER — LEVOTHYROXINE SODIUM 25 MCG PO TABS
50.0000 ug | ORAL_TABLET | Freq: Every day | ORAL | Status: DC
  Filled 2022-01-01: qty 2

## 2022-01-01 MED ORDER — HYDRALAZINE HCL 10 MG PO TABS
10.0000 mg | ORAL_TABLET | Freq: Two times a day (BID) | ORAL | Status: DC
  Administered 2022-01-01 – 2022-01-03 (×4): 10 mg via ORAL
  Filled 2022-01-01 (×4): qty 1

## 2022-01-01 MED ORDER — POTASSIUM CHLORIDE CRYS ER 20 MEQ PO TBCR
20.0000 meq | EXTENDED_RELEASE_TABLET | ORAL | Status: DC
  Administered 2022-01-03: 20 meq via ORAL
  Filled 2022-01-01: qty 1

## 2022-01-01 MED ORDER — ACETAMINOPHEN 500 MG PO TABS
1000.0000 mg | ORAL_TABLET | Freq: Four times a day (QID) | ORAL | Status: DC | PRN
  Administered 2022-01-01 – 2022-01-03 (×3): 1000 mg via ORAL
  Filled 2022-01-01 (×4): qty 2

## 2022-01-01 MED ORDER — LOSARTAN POTASSIUM 25 MG PO TABS
25.0000 mg | ORAL_TABLET | Freq: Every day | ORAL | Status: DC
  Administered 2022-01-01 – 2022-01-03 (×3): 25 mg via ORAL
  Filled 2022-01-01 (×3): qty 1

## 2022-01-01 NOTE — ED Provider Notes (Signed)
[facility] EMERGENCY DEPARTMENT Provider Note   CSN: [id number] Arrival date & time: 01/01/22  1740     History  Chief Complaint  Patient presents with   Chest Pain    [name] is a 88 y.o. female.  The history is provided by the patient and medical records. No language interpreter was used.  Chest Pain Pain location:  Substernal area Pain quality: aching   Pain radiates to:  Does not radiate Pain severity:  Moderate Onset quality:  Unable to specify Timing:  Constant Progression:  Waxing and waning Relieved by:  Nothing Worsened by:  Nothing Associated symptoms: altered mental status and headache   Associated symptoms: no abdominal pain, no back pain, no claudication, no cough, no diaphoresis, no dizziness, no fatigue, no fever, no nausea, no near-syncope, no palpitations, no shortness of breath, no vomiting and no weakness        Home Medications Prior to Admission medications   Medication Sig Start Date End Date Taking? Authorizing Provider  acetaminophen (TYLENOL) 500 MG tablet Take 1,000 mg by mouth every 6 (six) hours as needed for headache (pain).    [provider]  amiodarone (PACERONE) 200 MG tablet Take 200 mg by mouth daily. 12/11/21   [provider]  amLODipine (NORVASC) 5 MG tablet Take 1 tablet (5 mg total) by mouth daily. 05/19/19   Domenic Polite, MD  atorvastatin (LIPITOR) 40 MG tablet Take 1 tablet (40 mg total) by mouth daily at 6 PM. 09/02/19   Dixie Dials, MD  clopidogrel (PLAVIX) 75 MG tablet Take 1 tablet (75 mg total) by mouth daily. 09/02/19   Dixie Dials, MD  diclofenac sodium (VOLTAREN) 1 % GEL APPLY 2-4 GRAMS TO AFFECTED AREA 2-3 TIMES A DAY AS NEEDED. Patient taking differently: Apply 2-4 g topically 2 (two) times daily. 04/30/19   Meredith Pel, MD  esomeprazole (NEXIUM) 40 MG capsule Take 40 mg by mouth daily before breakfast.     [provider]  furosemide (LASIX) 40 MG tablet  Take 0.5 tablets (20 mg total) by mouth every Monday, Wednesday, and Friday. 12/03/21   Dixie Dials, MD  hydrALAZINE (APRESOLINE) 10 MG tablet Take 1 tablet (10 mg total) by mouth 2 (two) times daily. 12/02/21   Dixie Dials, MD  isosorbide mononitrate (IMDUR) 30 MG 24 hr tablet Take 1 tablet (30 mg total) by mouth daily. 09/02/19   Dixie Dials, MD  levothyroxine (SYNTHROID) 50 MCG tablet Take 50 mcg by mouth daily. 03/04/19   [provider]  losartan (COZAAR) 25 MG tablet Take 1 tablet (25 mg total) by mouth daily. 09/03/19   Dixie Dials, MD  Melatonin 3 MG TABS Take 3 mg by mouth at bedtime as needed (sleep).     [provider]  metoprolol tartrate (LOPRESSOR) 25 MG tablet Take 25 mg by mouth 2 (two) times daily. 12/11/21   [provider]  nitroGLYCERIN (NITROSTAT) 0.4 MG SL tablet Place 1 tablet (0.4 mg total) under the tongue every 5 (five) minutes x 3 doses as needed for chest pain. 09/02/19   Dixie Dials, MD  Polyethyl Glycol-Propyl Glycol (SYSTANE) 0.4-0.3 % GEL ophthalmic gel Place 1 application into both eyes at bedtime.    [provider]  potassium chloride SA (KLOR-CON M20) 20 MEQ tablet Take 1 tablet (20 mEq total) by mouth every Monday, Wednesday, and Friday. 12/03/21   Dixie Dials, MD      Allergies    Codeine and Pregabalin  Review of Systems   Review of Systems  Constitutional:  Negative for chills, diaphoresis, fatigue and fever.  HENT:  Negative for congestion.   Respiratory:  Negative for cough, chest tightness, shortness of breath and wheezing.   Cardiovascular:  Positive for chest pain. Negative for palpitations, claudication and near-syncope.  Gastrointestinal:  Negative for abdominal pain, constipation, diarrhea, nausea and vomiting.  Genitourinary:  Negative for dysuria and flank pain.  Musculoskeletal:  Negative for back pain, neck pain and neck stiffness.  Skin:  Negative for rash and wound.  Neurological:  Positive for  headaches. Negative for dizziness, weakness and light-headedness.  Psychiatric/Behavioral:  Positive for confusion. Negative for agitation.   All other systems reviewed and are negative.   Physical Exam Updated Vital Signs BP (!) 151/72 (BP Location: Left Arm)   Pulse (!) 57   Temp 98.4 F (36.9 C)   Resp 20   SpO2 100%  Physical Exam Vitals and nursing note reviewed.  Constitutional:      General: She is not in acute distress.    Appearance: She is well-developed. She is not ill-appearing, toxic-appearing or diaphoretic.  HENT:     Head: Normocephalic and atraumatic.  Eyes:     Extraocular Movements: Extraocular movements intact.     Conjunctiva/sclera: Conjunctivae normal.     Pupils: Pupils are equal, round, and reactive to light.  Cardiovascular:     Rate and Rhythm: Normal rate and regular rhythm.     Heart sounds: No murmur heard. Pulmonary:     Effort: Pulmonary effort is normal. No respiratory distress.     Breath sounds: Normal breath sounds. No decreased breath sounds, wheezing, rhonchi or rales.  Chest:     Chest wall: Mass and tenderness present.       Comments: Lungs clear otherwise. Abdominal:     Palpations: Abdomen is soft.     Tenderness: There is no abdominal tenderness.  Musculoskeletal:        General: No swelling.     Cervical back: Neck supple.     Right lower leg: No tenderness. No edema.     Left lower leg: No tenderness. No edema.  Skin:    General: Skin is warm and dry.     Capillary Refill: Capillary refill takes less than 2 seconds.     Findings: No erythema.  Neurological:     General: No focal deficit present.     Mental Status: She is alert.  Psychiatric:        Mood and Affect: Mood normal.     ED Results / Procedures / Treatments   Labs (all labs ordered are listed, but only abnormal results are displayed) Labs Reviewed  BASIC METABOLIC PANEL - Abnormal; Notable for the following components:      Result Value   Glucose, Bld  108 (*)    Creatinine, Ser 1.13 (*)    GFR, Estimated 45 (*)    All other components within normal limits  CBC - Abnormal; Notable for the following components:   RDW 17.8 (*)    All other components within normal limits  TSH - Abnormal; Notable for the following components:   TSH 35.399 (*)    All other components within normal limits  HEPATIC FUNCTION PANEL - Abnormal; Notable for the following components:   Total Protein 6.3 (*)    All other components within normal limits  URINALYSIS, ROUTINE W REFLEX MICROSCOPIC - Abnormal; Notable for the following components:   APPearance HAZY (*)  Nitrite POSITIVE (*)    Leukocytes,Ua LARGE (*)    WBC, UA >50 (*)    Bacteria, UA RARE (*)    All other components within normal limits  URINE CULTURE  AMMONIA  TROPONIN I (HIGH SENSITIVITY)  TROPONIN I (HIGH SENSITIVITY)    EKG EKG Interpretation  Date/Time:  Saturday January 01 2022 10:03:38 EDT Ventricular Rate:  56 PR Interval:  216 QRS Duration: 158 QT Interval:  502 QTC Calculation: 484 R Axis:   -32 Text Interpretation: Sinus bradycardia with 1st degree A-V block Left axis deviation Left bundle branch block Abnormal ECG When compared with ECG of 30-Dec-2021 09:29, PREVIOUS ECG IS PRESENT when compared to prior, similar appearance. No STEMI Confirmed by Antony Blackbird [phone]) on 01/01/2022 10:16:37 AM  Radiology CT Angio Chest PE W and/or Wo Contrast  Result Date: 01/01/2022 CLINICAL DATA:  Altered mental status following trauma last week. Chest and right arm pain. History of acute pulmonary embolism. History of rectal and breast cancer. EXAM: CT ANGIOGRAPHY CHEST WITH CONTRAST TECHNIQUE: Multidetector CT imaging of the chest was performed using the standard protocol during bolus administration of intravenous contrast. Multiplanar CT image reconstructions and MIPs were obtained to evaluate the vascular anatomy. RADIATION DOSE REDUCTION: This exam was performed according to the  departmental dose-optimization program which includes automated exposure control, adjustment of the mA and/or kV according to patient size and/or use of iterative reconstruction technique. CONTRAST:  67m OMNIPAQUE IOHEXOL 350 MG/ML SOLN COMPARISON:  Chest CT 12/14/2015. FINDINGS: Cardiovascular: The pulmonary arteries are well opacified with contrast to the level of the subsegmental branches. There is no evidence of acute pulmonary embolism. Diffuse atherosclerosis of the aorta, great vessels and coronary arteries. No acute systemic arterial abnormalities are identified. There are calcifications of the aortic valve. The heart is mildly enlarged. No significant pericardial fluid. Mediastinum/Nodes: No enlarged mediastinal, hilar, axillary or internal mammary lymph nodes are identified. There are postsurgical changes in the right axilla. Stable small hiatal hernia. Lungs/Pleura: No pleural effusion or pneumothorax. There is chronic lung disease with subpleural reticulation, central airway thickening and mild mosaic attenuation, likely due to chronic interstitial lung disease. No superimposed airspace disease or suspicious pulmonary nodularity. Upper abdomen: No acute findings are seen within the visualized upper abdomen. There are surgical clips in the gastrohepatic ligament. Musculoskeletal/Chest wall: New soft tissue mass medially in the left breast measuring 2.5 x 2.4 x 2.4 cm, suspicious for malignancy. Previous right mastectomy and breast reconstruction. No acute osseous findings are identified. There are degenerative changes throughout the spine. Review of the MIP images confirms the above findings. IMPRESSION: 1. No evidence of acute pulmonary embolism or other acute chest findings. 2. New soft tissue mass medially in the left breast, highly worrisome for breast cancer. Correlate clinically and consider biopsy if not recently performed. 3. Stable chronic interstitial lung disease. 4. Coronary and Aortic  Atherosclerosis (ICD10-I70.0). Electronically Signed   By: WRichardean SaleM.D.   On: 01/01/2022 12:23   CT HEAD WO CONTRAST (5MM)  Result Date: 01/01/2022 CLINICAL DATA:  Mental status change. Persistent or worsening confusion after trauma last week. EXAM: CT HEAD WITHOUT CONTRAST TECHNIQUE: Contiguous axial images were obtained from the base of the skull through the vertex without intravenous contrast. RADIATION DOSE REDUCTION: This exam was performed according to the departmental dose-optimization program which includes automated exposure control, adjustment of the mA and/or kV according to patient size and/or use of iterative reconstruction technique. COMPARISON:  Head CT 12/20/2021.  MRI head 05/19/2019.  FINDINGS: Brain: There is no evidence of acute intracranial hemorrhage, mass lesion, brain edema or extra-axial fluid collection. Stable atrophy with prominence of the ventricles and subarachnoid spaces. Patchy low-density in the periventricular white matter appears unchanged, likely due to chronic small vessel ischemic changes. There is no CT evidence of acute cortical infarction. Vascular: Intracranial vascular calcifications. No hyperdense vessel identified. Skull: Negative for fracture or focal lesion. Sinuses/Orbits: The visualized paranasal sinuses and mastoid air cells are clear. No orbital abnormalities are seen. Other: Previous lens surgery bilaterally. IMPRESSION: Stable head CT without acute intracranial findings. Chronic small vessel ischemic changes as before. Electronically Signed   By: Richardean Sale M.D.   On: 01/01/2022 12:13   DG Humerus Right  Result Date: 01/01/2022 CLINICAL DATA:  Fall.  Pain.  Altered mental status. EXAM: RIGHT HUMERUS - 2+ VIEW COMPARISON:  None Available. FINDINGS: No fracture. No bone lesion. Skeletal structures are demineralized. Glenohumeral and elbow joints are normally aligned. Multiple surgical vascular clips overlie the right anterolateral chest wall a  prior right mastectomy. IMPRESSION: No fracture or dislocation. Electronically Signed   By: Lajean Manes M.D.   On: 01/01/2022 11:59   DG Humerus Left  Result Date: 01/01/2022 CLINICAL DATA:  Fall.  Pain.  Altered mental status. EXAM: LEFT HUMERUS - 2+ VIEW COMPARISON:  Left shoulder radiographs, 12/20/2021. FINDINGS: No fracture. No bone lesion. Skeletal structures are demineralized. Glenohumeral joint is normally aligned, but narrowed with flattening and sclerosis of the subchondral humeral head, and marginal osteophytes, stable from the prior radiographs. Elbow joint normally aligned. Soft tissues are unremarkable. IMPRESSION: 1. No fracture or dislocation. 2. Glenohumeral joint arthropathic changes. Electronically Signed   By: Lajean Manes M.D.   On: 01/01/2022 11:58   DG Knee Complete 4 Views Right  Result Date: 01/01/2022 CLINICAL DATA:  Fall.  Pain.  Altered mental status. EXAM: RIGHT KNEE - COMPLETE 4+ VIEW COMPARISON:  None Available. FINDINGS: No fracture or bone lesion. Skeletal structures are diffusely demineralized. Marked medial joint space compartment narrowing with mild subchondral sclerosis and small marginal osteophytes. Small marginal osteophytes from the lateral compartment and patella. No joint effusion. Posterior arterial vascular calcifications. IMPRESSION: 1. No fracture or acute finding. 2. Osteoarthritis predominantly involving the medial compartment. Electronically Signed   By: Lajean Manes M.D.   On: 01/01/2022 11:57   DG Chest 2 View  Result Date: 01/01/2022 CLINICAL DATA:  Chest pain with shortness of breath. EXAM: CHEST - 2 VIEW COMPARISON:  Radiographs 12/30/2021 and 12/28/2021.  CT 12/14/2015. FINDINGS: The heart size and mediastinal contours are stable with mild cardiomegaly and aortic atherosclerosis. Stable chronic interstitial prominence in both lung bases. No edema, confluent airspace opacity, pleural effusion or pneumothorax. Postsurgical changes in the right chest  wall with old right rib and clavicle deformities. No acute osseous findings. IMPRESSION: Stable chronic cardiomegaly and interstitial prominence. No acute cardiopulmonary process. Electronically Signed   By: Richardean Sale M.D.   On: 01/01/2022 10:34    Procedures Procedures    CRITICAL CARE Performed by: Gwenyth Allegra Heavyn Yearsley Total critical care time: 35 minutes Critical care time was exclusive of separately billable procedures and treating other patients. Critical care was necessary to treat or prevent imminent or life-threatening deterioration. Critical care was time spent personally by me on the following activities: development of treatment plan with patient and/or surrogate as well as nursing, discussions with consultants, evaluation of patient's response to treatment, examination of patient, obtaining history from patient or surrogate, ordering and performing treatments  and interventions, ordering and review of laboratory studies, ordering and review of radiographic studies, pulse oximetry and re-evaluation of patient's condition.   Medications Ordered in ED Medications  cefTRIAXone (ROCEPHIN) 1 g in sodium chloride 0.9 % 100 mL IVPB (has no administration in time range)  iohexol (OMNIPAQUE) 350 MG/ML injection 60 mL (60 mLs Intravenous Contrast Given 01/01/22 1158)    ED Course/ Medical Decision Making/ A&P                           Medical Decision Making Amount and/or Complexity of Data Reviewed Labs: ordered. Radiology: ordered.  Risk Prescription drug management. Decision regarding hospitalization.    SHIELA BRUNS is a 88 y.o. female with a past medical history significant for previous pulm embolism not on anticoagulation, previous pericardial fusion, stroke, CKD, CHF, hypothyroidism, previous stroke, breast cancer, colon cancer, rectal cancer, and fall last week who is presenting for further evaluation of chest pain.  According to patient, she does not know why she is  feeling so bad but was reporting pain in her right knee and pain in her chest and upper arms today.  Chart review shows that this is her fourth visit in the last 2 weeks for chest pain after a fall..  She had a mechanical fall last Monday and then has come in multiple times.  She is complaining of intermittent pain in her central chest that she does not think radiates but she is complaining of pain in both of her upper arms.  She denies any fevers, chills, congestion, cough, nausea, vomiting, constipation, or diarrhea.  She does think she feels a little bit more confused than her baseline and is having occasional headaches.  She is unsure if she hit her head in the fall but reports she has not had any imaging of her head yet.  She reports no other complaints today and does not know why she feels bad.  On my exam, lungs were clear and chest was nontender.  Abdomen nontender.  She has no tenderness of her right knee but had intact sensation, strength, and pulses.  Grip strength symmetric.  She reported some mild pain in both her upper arms but did not have focal tenderness on my exam.  No tenderness in the shoulders.  Neck and back nontender.  Patient had symmetric smile and clear speech.  Pupils are symmetric and reactive, extract movements.  Patient otherwise well-appearing.  As this is the fourth visit for pain after fall, despite several negative chest x-rays, I do feel she likely requires more advanced imaging with a CT scan of her chest to rule out occult fracture such as rib fractures but given her history of pulmonary emboli and no anticoagulation now, we will get a CT PE study to both rule out out and look for injuries.  We will get x-rays of her upper arms and x-ray of her knee.  We will get some screening labs given the reported feeling confused and a CT head again due to her headache.  If work-up is reassuring, dissipate she would likely be stable for discharge home.  2:08 PM Work-up continues to  return.  CT PE study does not show evidence of pneumonia or blood clot or acute trauma but does show a new left breast mass concerning for cancer.  Patient reports that that bump has been there for "a little while" but does not know when it started.  She says that everyone  said it was normal however when I called the daughter the daughter has no idea about this mass.  Daughter does say that the patient has been more confused over the last few months but even in the last week or 2 patient is getting more confused.  She is calling telling her that "something is not right and something is wrong".  This is the fourth visit in the last week and the sixth visit for chest discomfort in the last few months.  I am concerned that patient has a new breast cancer that is causing significant pain and given the new confusion/altered mental status and headaches feel we may need to get MRI brain with and without to rule out brain metastasis.  Her CT head did not show evidence of acute trauma showed some chronic microvascular changes.  No bleeding seen.  Her other labs also showed that her TSH was much more elevated than prior.  Suspect a degree of symptomatic hypothyroidism leading to some of her confusion.  We will wait for urinalysis and other labs however given the worsening confusion, chest pain likely related to a new cancer, and family reporting of worsening altered mental status, I do anticipate patient will likely need admission.  Care will be transferred to oncoming team to await results of urinalysis, MRI brain, and reassessment however anticipate admission for worsening altered mental status, chest pain likely due to tumor, and possible symptomatic hypothyroidism.  3:07 PM Patient's urinalysis does show evidence of UTI.  Given the family's report of worsening mental status and confusion, we will call for admission for treatment for UTI, waiting for the results of MRI brain with and without contrast, and further  evaluation of the thyroid.          Final Clinical Impression(s) / ED Diagnoses Final diagnoses:  Acute cystitis without hematuria  Mass of left breast, unspecified quadrant  Chest pain, unspecified type  Altered mental status, unspecified altered mental status type     Clinical Impression: 1. Acute cystitis without hematuria   2. Mass of left breast, unspecified quadrant   3. Chest pain, unspecified type   4. Altered mental status, unspecified altered mental status type     Disposition: Admit  This note was prepared with assistance of Dragon voice recognition software. Occasional wrong-word or sound-a-like substitutions may have occurred due to the inherent limitations of voice recognition software.      Rashaun Curl, Gwenyth Allegra, MD 01/01/22 [phone]

## 2022-01-01 NOTE — ED Triage Notes (Addendum)
Patient BIB [facility] from [facility] for evaluation of chest pain and shortness of breath. Patient seen for same twice this week at [facility], patient states she believes chest x-rays are missing something. Patient is alert and in no apparent distress at this time.  EMS Vitals BP 166/70 HR 56 100% on room air
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
## 2022-01-01 NOTE — ED Notes (Signed)
Patient transported to MRI 

## 2022-01-01 NOTE — ED Notes (Signed)
Notified Tegeler MD of BP 395 systolic. No orders at this time.

## 2022-01-02 DIAGNOSIS — Z79899 Other long term (current) drug therapy: Secondary | ICD-10-CM | POA: Diagnosis not present

## 2022-01-02 DIAGNOSIS — N390 Urinary tract infection, site not specified: Secondary | ICD-10-CM | POA: Diagnosis present

## 2022-01-02 DIAGNOSIS — Z743 Need for continuous supervision: Secondary | ICD-10-CM | POA: Diagnosis not present

## 2022-01-02 DIAGNOSIS — I5032 Chronic diastolic (congestive) heart failure: Secondary | ICD-10-CM | POA: Diagnosis present

## 2022-01-02 DIAGNOSIS — R079 Chest pain, unspecified: Secondary | ICD-10-CM | POA: Diagnosis present

## 2022-01-02 DIAGNOSIS — Z7902 Long term (current) use of antithrombotics/antiplatelets: Secondary | ICD-10-CM | POA: Diagnosis not present

## 2022-01-02 DIAGNOSIS — K219 Gastro-esophageal reflux disease without esophagitis: Secondary | ICD-10-CM | POA: Diagnosis present

## 2022-01-02 DIAGNOSIS — I739 Peripheral vascular disease, unspecified: Secondary | ICD-10-CM | POA: Diagnosis present

## 2022-01-02 DIAGNOSIS — N3000 Acute cystitis without hematuria: Secondary | ICD-10-CM | POA: Diagnosis not present

## 2022-01-02 DIAGNOSIS — F419 Anxiety disorder, unspecified: Secondary | ICD-10-CM | POA: Diagnosis present

## 2022-01-02 DIAGNOSIS — N1831 Chronic kidney disease, stage 3a: Secondary | ICD-10-CM

## 2022-01-02 DIAGNOSIS — Z86711 Personal history of pulmonary embolism: Secondary | ICD-10-CM | POA: Diagnosis not present

## 2022-01-02 DIAGNOSIS — R410 Disorientation, unspecified: Secondary | ICD-10-CM | POA: Diagnosis not present

## 2022-01-02 DIAGNOSIS — Z8673 Personal history of transient ischemic attack (TIA), and cerebral infarction without residual deficits: Secondary | ICD-10-CM | POA: Diagnosis not present

## 2022-01-02 DIAGNOSIS — K589 Irritable bowel syndrome without diarrhea: Secondary | ICD-10-CM | POA: Diagnosis present

## 2022-01-02 DIAGNOSIS — G9341 Metabolic encephalopathy: Secondary | ICD-10-CM | POA: Diagnosis present

## 2022-01-02 DIAGNOSIS — R222 Localized swelling, mass and lump, trunk: Secondary | ICD-10-CM | POA: Diagnosis present

## 2022-01-02 DIAGNOSIS — Z7401 Bed confinement status: Secondary | ICD-10-CM | POA: Diagnosis not present

## 2022-01-02 DIAGNOSIS — I10 Essential (primary) hypertension: Secondary | ICD-10-CM | POA: Diagnosis not present

## 2022-01-02 DIAGNOSIS — Z79890 Hormone replacement therapy (postmenopausal): Secondary | ICD-10-CM | POA: Diagnosis not present

## 2022-01-02 DIAGNOSIS — J42 Unspecified chronic bronchitis: Secondary | ICD-10-CM | POA: Diagnosis present

## 2022-01-02 DIAGNOSIS — Z96652 Presence of left artificial knee joint: Secondary | ICD-10-CM | POA: Diagnosis present

## 2022-01-02 DIAGNOSIS — N6320 Unspecified lump in the left breast, unspecified quadrant: Secondary | ICD-10-CM | POA: Diagnosis present

## 2022-01-02 DIAGNOSIS — I480 Paroxysmal atrial fibrillation: Secondary | ICD-10-CM | POA: Diagnosis present

## 2022-01-02 DIAGNOSIS — E039 Hypothyroidism, unspecified: Secondary | ICD-10-CM | POA: Diagnosis present

## 2022-01-02 DIAGNOSIS — I130 Hypertensive heart and chronic kidney disease with heart failure and stage 1 through stage 4 chronic kidney disease, or unspecified chronic kidney disease: Secondary | ICD-10-CM | POA: Diagnosis present

## 2022-01-02 DIAGNOSIS — Z853 Personal history of malignant neoplasm of breast: Secondary | ICD-10-CM | POA: Diagnosis not present

## 2022-01-02 DIAGNOSIS — Z91148 Patient's other noncompliance with medication regimen for other reason: Secondary | ICD-10-CM | POA: Diagnosis not present

## 2022-01-02 DIAGNOSIS — C50912 Malignant neoplasm of unspecified site of left female breast: Secondary | ICD-10-CM | POA: Diagnosis present

## 2022-01-02 LAB — BASIC METABOLIC PANEL
Anion gap: 9 (ref 5–15)
BUN: 13 mg/dL (ref 8–23)
CO2: 22 mmol/L (ref 22–32)
Calcium: 9.6 mg/dL (ref 8.9–10.3)
Chloride: 105 mmol/L (ref 98–111)
Creatinine, Ser: 0.84 mg/dL (ref 0.44–1.00)
GFR, Estimated: >60 mL/min (ref >60)
Glucose, Bld: 107 mg/dL — ABNORMAL HIGH (ref 70–99)
Potassium: 3.8 mmol/L (ref 3.5–5.1)
Sodium: 136 mmol/L (ref 135–145)

## 2022-01-02 LAB — CBC
HCT: 39.1 % (ref 36.0–46.0)
Hemoglobin: 12.4 g/dL (ref 12.0–15.0)
MCH: 27.9 pg (ref 26.0–34.0)
MCHC: 31.7 g/dL (ref 30.0–36.0)
MCV: 87.9 fL (ref 80.0–100.0)
Platelets: 201 10*3/uL (ref 150–400)
RBC: 4.45 MIL/uL (ref 3.87–5.11)
RDW: 17.9 % — ABNORMAL HIGH (ref 11.5–15.5)
WBC: 7 10*3/uL (ref 4.0–10.5)
nRBC: 0 % (ref 0.0–0.2)

## 2022-01-02 MED ORDER — LEVOTHYROXINE SODIUM 75 MCG PO TABS
75.0000 ug | ORAL_TABLET | Freq: Every day | ORAL | Status: DC
  Administered 2022-01-03: 75 ug via ORAL
  Filled 2022-01-02: qty 1

## 2022-01-02 NOTE — Progress Notes (Signed)
PROGRESS NOTE    [name]  UKG:[id number] DOB: [birth year] DOA: 01/01/2022 PCP: [name], MD    Brief Narrative:  [name] is a 88 y.o. female with past medical history of anxiety, history of breast cancer status postmastectomy on the right, heart failure with preserved ejection fraction, GERD, hypertension, hypothyroidism, irritable bowel syndrome, peripheral vascular disease, history of stroke, chronic bronchitis presented to hospital with confusion.  Patient presented from [facility] facility in has had recent few visits to the hospital for chest pain.  Recent CT scan of the chest was negative for PE but mass was reported on the left breast.  Patient was also noted to be confused.  Patient was unable to tell how long the mass was in the breast.  Urinalysis in the ED showed a large leuks with positive nitrites but has history of positive nitrates in the past.  MRI of the brain did not show any metastatic disease.  Creatinine was baseline.  WBC was not elevated.  Of note, patient was seen in the ED on 12/20/21 after a fall followed by 12/28/21 and 12/30/21 for chest pain.    Assessment and plan. Principal Problem:   UTI (urinary tract infection) Active Problems:   Hypothyroidism   Essential hypertension   GERD   Chronic kidney disease, stage III (moderate) (HCC)   History of breast cancer   Left breast mass   Acute metabolic encephalopathy   UTI (urinary tract infection)/Acute metabolic encephalopathy Urinalysis with positive nitrate and leukocytes.  Received IV Rocephin.  Feels little better today and is more alert awake and communicative.   Ammonia was less than 10.  She has extremely elevated at 35 from 4.62 years back.  Will need adjustment of Synthroid.  LFTs were within normal limits.  Patient is more alert awake and communicative today.  Encephalopathy has improved.  Hypothyroidism TSH extremely elevated compared to prior.  Question of compliance. Will need to  increase dose of Synthroid on discharge with outpatient TSH follow-up in 4 to 6 weeks.  On Synthroid 50 mcg on the home medication list.    Essential hypertension/Chronic HFpEF (not in exacerbation) Blood pressure was elevated on presentation.  Has slightly improved.  Resumed on home amlodipine, lasix, hydralazine, IMDUR, losartan, metoprolol    GERD Continue PPI.    Chronic kidney disease, stage IIIa (moderate) Continue to monitor renal function.  Will closely monitor on Lasix.  History of right breast cancer right breast mastectomy. Left chest wall mass seen on imaging.   CTA suggestive of possible breast cancer.  Possible interstitial lung disease. MRI of the brain without any metastatic disease.  I had a prolonged discussion about the left breast mass with the patient's daughter who states that she does not wish to proceed with further biopsy or any intervention for the patient and wishes to leave it alone for now  Paroxysmal Afib On amiodarone, metoprolol.  Not on anticoagulation as outpatient.      DVT prophylaxis: enoxaparin (LOVENOX) injection 40 mg Start: 01/01/22 2200   Code Status:     Code Status: Partial Code  Disposition: Patient is from [facility] facility.  Independent living facility.  Status is: Observation  The patient will require care spanning > 2 midnights and should be moved to inpatient because: Left breast mass, metabolic encephalopathy, IV antibiotic   Family Communication:  spoke with the patient's daughter on the phone on 01/02/2022  Consultants:  None  Procedures:  None  Antimicrobials:  Rocephin IV  7/1>  Anti-infectives (From admission, onward)    Start     Dose/Rate Route Frequency Ordered Stop   01/02/22 1200  cefTRIAXone (ROCEPHIN) 1 g in sodium chloride 0.9 % 100 mL IVPB        1 g 200 mL/hr over 30 Minutes Intravenous Every 24 hours 01/01/22 1826     01/01/22 1815  cefTRIAXone (ROCEPHIN) 1 g in sodium chloride 0.9 % 100 mL IVPB   Status:  Discontinued        1 g 200 mL/hr over 30 Minutes Intravenous Every 24 hours 01/01/22 1814 01/01/22 1825   01/01/22 1515  cefTRIAXone (ROCEPHIN) 1 g in sodium chloride 0.9 % 100 mL IVPB        1 g 200 mL/hr over 30 Minutes Intravenous  Once 01/01/22 1509 01/01/22 1802      Subjective:  Today, patient was seen and examined at bedside.  Patient states that she feels a little better today.  Denies chest pain or shortness of breath.  States that she was able to eat okay and has been drinking some water.  Patient  unaware of the breast mass on the left  Objective: Vitals:   01/02/22 0237 01/02/22 0400 01/02/22 0755 01/02/22 1236  BP: (!) 152/70 (!) 142/70 (!) 153/63 (!) 159/60  Pulse: (!) 50 (!) 47 (!) 51 (!) 49  Resp: '18 16 17 17  '$ Temp: 98 F (36.7 C) 97.6 F (36.4 C) 97.8 F (36.6 C) 97.6 F (36.4 C)  TempSrc: Oral Oral Oral Oral  SpO2: 98% 97% 96% 100%  Weight:  72.9 kg    Height:        Intake/Output Summary (Last 24 hours) at 01/02/2022 1557 Last data filed at 01/02/2022 1248 Gross per 24 hour  Intake 340 ml  Output --  Net 340 ml   Filed Weights   01/01/22 1623 01/02/22 0400  Weight: 71 kg 72.9 kg    Physical Examination:  Body mass index is 25.94 kg/m.   General:  Average built, not in obvious distress awake and communicative. HENT:   No scleral pallor or icterus noted. Oral mucosa is moist.  Chest:  Diminished breath sounds bilaterally.  Large hard lump on the left breast.  History of mastectomy on the right side.. No crackles or wheezes.  CVS: S1 &S2 heard. No murmur.  Regular rate and rhythm. Abdomen: Soft, nontender, nondistended.  Bowel sounds are heard.   Extremities: No cyanosis, clubbing or edema.  Peripheral pulses are palpable. Psych: Alert, awake and communicative, normal mood CNS:  No cranial nerve deficits.  Power equal in all extremities.   Skin: Warm and dry.  No rashes noted.  Data Reviewed:   CBC: Recent Labs  Lab 12/28/21 0948  12/30/21 0944 12/30/21 1004 01/01/22 1008 01/01/22 2010 01/02/22 0428  WBC 5.7 5.7  --  6.7 6.2 7.0  HGB 12.4 12.4 13.6 13.5 12.7 12.4  HCT 37.9 39.5 40.0 41.7 40.4 39.1  MCV 86.7 87.2  --  86.7 88.0 87.9  PLT 198 217  --  224 208 017    Basic Metabolic Panel: Recent Labs  Lab 12/28/21 0948 12/30/21 0944 12/30/21 1004 01/01/22 1008 01/01/22 2010 01/02/22 0428  NA 139 138 138 135  --  136  K 4.5 4.0 3.8 3.9  --  3.8  CL 104 107 104 101  --  105  CO2 21* 22  --  22  --  22  GLUCOSE 111* 108* 104* 108*  --  107*  BUN '11 13 14 15  '$ --  13  CREATININE 1.13* 1.12* 1.00 1.13* 1.01* 0.84  CALCIUM 10.0 9.4  --  10.2  --  9.6    Liver Function Tests: Recent Labs  Lab 12/28/21 0948 01/01/22 1211  AST 27 20  ALT 14 16  ALKPHOS 49 45  BILITOT 1.0 0.8  PROT 6.5 6.3*  ALBUMIN 3.9 3.5     Radiology Studies: MR Brain W and Wo Contrast  Result Date: 01/01/2022 CLINICAL DATA:  Altered mental status. New diagnosis of breast cancer. EXAM: MRI HEAD WITHOUT AND WITH CONTRAST TECHNIQUE: Multiplanar, multiecho pulse sequences of the brain and surrounding structures were obtained without and with intravenous contrast. CONTRAST:  68m GADAVIST GADOBUTROL 1 MMOL/ML IV SOLN COMPARISON:  CT head without contrast 01/01/2022. MR head without contrast 05/19/2019 FINDINGS: Brain: Moderate atrophy and white matter changes are similar the prior exam. Dilated perivascular spaces are present bilaterally. Remote subcortical white matter infarct is present in the left frontal lobe. Postcontrast images demonstrate no pathologic enhancement to suggest metastatic disease of the brain. The ventricles are of normal size. No significant extraaxial fluid collection is present. A remote lacunar infarct is present in the right thalamus. The internal auditory canals are within normal limits. Remote lacunar infarcts are present in the right cerebellum. Brainstem and cerebellum are otherwise unremarkable. Vascular: Insert  normal flow Skull and upper cervical spine: Degenerative changes of the upper cervical spine with grade 1 anterolisthesis at C2-3 again noted. Marked degenerative changes are present at C1-2. The craniocervical junction is normal. Midline structures are unremarkable. Sinuses/Orbits: The paranasal sinuses and mastoid air cells are clear. Bilateral lens replacements are noted. Globes and orbits are otherwise unremarkable. IMPRESSION: 1. No evidence for metastatic disease to the brain or meninges. 2. No acute intracranial abnormality. 3. Stable moderate atrophy and white matter disease likely reflects the sequela of chronic microvascular ischemia. 4. Remote lacunar infarcts of the right thalamus and right cerebellum. Electronically Signed   By: CSan MorelleM.D.   On: 01/01/2022 17:17   CT Angio Chest PE W and/or Wo Contrast  Result Date: 01/01/2022 CLINICAL DATA:  Altered mental status following trauma last week. Chest and right arm pain. History of acute pulmonary embolism. History of rectal and breast cancer. EXAM: CT ANGIOGRAPHY CHEST WITH CONTRAST TECHNIQUE: Multidetector CT imaging of the chest was performed using the standard protocol during bolus administration of intravenous contrast. Multiplanar CT image reconstructions and MIPs were obtained to evaluate the vascular anatomy. RADIATION DOSE REDUCTION: This exam was performed according to the departmental dose-optimization program which includes automated exposure control, adjustment of the mA and/or kV according to patient size and/or use of iterative reconstruction technique. CONTRAST:  632mOMNIPAQUE IOHEXOL 350 MG/ML SOLN COMPARISON:  Chest CT 12/14/2015. FINDINGS: Cardiovascular: The pulmonary arteries are well opacified with contrast to the level of the subsegmental branches. There is no evidence of acute pulmonary embolism. Diffuse atherosclerosis of the aorta, great vessels and coronary arteries. No acute systemic arterial abnormalities are  identified. There are calcifications of the aortic valve. The heart is mildly enlarged. No significant pericardial fluid. Mediastinum/Nodes: No enlarged mediastinal, hilar, axillary or internal mammary lymph nodes are identified. There are postsurgical changes in the right axilla. Stable small hiatal hernia. Lungs/Pleura: No pleural effusion or pneumothorax. There is chronic lung disease with subpleural reticulation, central airway thickening and mild mosaic attenuation, likely due to chronic interstitial lung disease. No superimposed airspace disease or suspicious pulmonary nodularity. Upper abdomen: No acute findings  are seen within the visualized upper abdomen. There are surgical clips in the gastrohepatic ligament. Musculoskeletal/Chest wall: New soft tissue mass medially in the left breast measuring 2.5 x 2.4 x 2.4 cm, suspicious for malignancy. Previous right mastectomy and breast reconstruction. No acute osseous findings are identified. There are degenerative changes throughout the spine. Review of the MIP images confirms the above findings. IMPRESSION: 1. No evidence of acute pulmonary embolism or other acute chest findings. 2. New soft tissue mass medially in the left breast, highly worrisome for breast cancer. Correlate clinically and consider biopsy if not recently performed. 3. Stable chronic interstitial lung disease. 4. Coronary and Aortic Atherosclerosis (ICD10-I70.0). Electronically Signed   By: Richardean Sale M.D.   On: 01/01/2022 12:23   CT HEAD WO CONTRAST (5MM)  Result Date: 01/01/2022 CLINICAL DATA:  Mental status change. Persistent or worsening confusion after trauma last week. EXAM: CT HEAD WITHOUT CONTRAST TECHNIQUE: Contiguous axial images were obtained from the base of the skull through the vertex without intravenous contrast. RADIATION DOSE REDUCTION: This exam was performed according to the departmental dose-optimization program which includes automated exposure control, adjustment of  the mA and/or kV according to patient size and/or use of iterative reconstruction technique. COMPARISON:  Head CT 12/20/2021.  MRI head 05/19/2019. FINDINGS: Brain: There is no evidence of acute intracranial hemorrhage, mass lesion, brain edema or extra-axial fluid collection. Stable atrophy with prominence of the ventricles and subarachnoid spaces. Patchy low-density in the periventricular white matter appears unchanged, likely due to chronic small vessel ischemic changes. There is no CT evidence of acute cortical infarction. Vascular: Intracranial vascular calcifications. No hyperdense vessel identified. Skull: Negative for fracture or focal lesion. Sinuses/Orbits: The visualized paranasal sinuses and mastoid air cells are clear. No orbital abnormalities are seen. Other: Previous lens surgery bilaterally. IMPRESSION: Stable head CT without acute intracranial findings. Chronic small vessel ischemic changes as before. Electronically Signed   By: Richardean Sale M.D.   On: 01/01/2022 12:13   DG Humerus Right  Result Date: 01/01/2022 CLINICAL DATA:  Fall.  Pain.  Altered mental status. EXAM: RIGHT HUMERUS - 2+ VIEW COMPARISON:  None Available. FINDINGS: No fracture. No bone lesion. Skeletal structures are demineralized. Glenohumeral and elbow joints are normally aligned. Multiple surgical vascular clips overlie the right anterolateral chest wall a prior right mastectomy. IMPRESSION: No fracture or dislocation. Electronically Signed   By: Lajean Manes M.D.   On: 01/01/2022 11:59   DG Humerus Left  Result Date: 01/01/2022 CLINICAL DATA:  Fall.  Pain.  Altered mental status. EXAM: LEFT HUMERUS - 2+ VIEW COMPARISON:  Left shoulder radiographs, 12/20/2021. FINDINGS: No fracture. No bone lesion. Skeletal structures are demineralized. Glenohumeral joint is normally aligned, but narrowed with flattening and sclerosis of the subchondral humeral head, and marginal osteophytes, stable from the prior radiographs. Elbow  joint normally aligned. Soft tissues are unremarkable. IMPRESSION: 1. No fracture or dislocation. 2. Glenohumeral joint arthropathic changes. Electronically Signed   By: Lajean Manes M.D.   On: 01/01/2022 11:58   DG Knee Complete 4 Views Right  Result Date: 01/01/2022 CLINICAL DATA:  Fall.  Pain.  Altered mental status. EXAM: RIGHT KNEE - COMPLETE 4+ VIEW COMPARISON:  None Available. FINDINGS: No fracture or bone lesion. Skeletal structures are diffusely demineralized. Marked medial joint space compartment narrowing with mild subchondral sclerosis and small marginal osteophytes. Small marginal osteophytes from the lateral compartment and patella. No joint effusion. Posterior arterial vascular calcifications. IMPRESSION: 1. No fracture or acute finding. 2. Osteoarthritis predominantly  involving the medial compartment. Electronically Signed   By: Lajean Manes M.D.   On: 01/01/2022 11:57   DG Chest 2 View  Result Date: 01/01/2022 CLINICAL DATA:  Chest pain with shortness of breath. EXAM: CHEST - 2 VIEW COMPARISON:  Radiographs 12/30/2021 and 12/28/2021.  CT 12/14/2015. FINDINGS: The heart size and mediastinal contours are stable with mild cardiomegaly and aortic atherosclerosis. Stable chronic interstitial prominence in both lung bases. No edema, confluent airspace opacity, pleural effusion or pneumothorax. Postsurgical changes in the right chest wall with old right rib and clavicle deformities. No acute osseous findings. IMPRESSION: Stable chronic cardiomegaly and interstitial prominence. No acute cardiopulmonary process. Electronically Signed   By: Richardean Sale M.D.   On: 01/01/2022 10:34      LOS: 0 days    Flora Lipps, MD [facility] Available via Epic secure chat 7am-7pm After these hours, please refer to coverage provider listed on [email or web address] 01/02/2022, 3:57 PM

## 2022-01-02 NOTE — Hospital Course (Addendum)
[name] is a 88 y.o. female with past medical history of anxiety, history of breast cancer status postmastectomy on the right, heart failure with preserved ejection fraction, GERD, hypertension, hypothyroidism, irritable bowel syndrome, peripheral vascular disease, history of stroke, chronic bronchitis presented to hospital with confusion.  Patient presented from [facility] facility in has had recent few visits to the hospital for chest pain.  Recent CT scan of the chest was negative for PE but mass was reported on the left breast.  Patient was also noted to be confused.  Patient was unable to tell how long the mass was in the breast.  Urinalysis in the ED showed a large leuks with positive nitrites but has history of positive nitrates in the past.  MRI of the brain did not show any metastatic disease.  Creatinine was baseline.  WBC was not elevated.  Of note, patient was seen in the ED on 12/20/21 after a fall followed by 12/28/21 and 12/30/21 for chest pain.    Assessment and plan. Principal Problem:   UTI (urinary tract infection) Active Problems:   Hypothyroidism   Essential hypertension   GERD   Chronic kidney disease, stage III (moderate) (HCC)   History of breast cancer   Left breast mass   Acute metabolic encephalopathy   UTI (urinary tract infection)/Acute metabolic encephalopathy Urinalysis with positive nitrate and leukocytes.  Received IV Rocephin.  Feels little better today and is more alert awake and communicative.   Ammonia was less than 10.  She has extremely elevated at 37 from 4.62 years back.  Will need adjustment of Synthroid.  LFTs were within normal limits.  Patient is more alert awake and communicative today.  Encephalopathy has improved.  Hypothyroidism TSH extremely elevated compared to prior.  Question of compliance. Will need to increase dose of Synthroid on discharge with outpatient TSH follow-up in 4 to 6 weeks.  On Synthroid 50 mcg on the home medication  list.    Essential hypertension/Chronic HFpEF (not in exacerbation) Blood pressure was elevated on presentation.  Has slightly improved.  Resumed on home amlodipine, lasix, hydralazine, IMDUR, losartan, metoprolol    GERD Continue PPI.    Chronic kidney disease, stage IIIa (moderate) Continue to monitor renal function.  Will closely monitor on Lasix.  History of right breast cancer right breast mastectomy. Left chest wall mass seen on imaging.   CTA suggestive of possible breast cancer.  Possible interstitial lung disease. MRI of the brain without any metastatic disease.  I had a prolonged discussion about the left breast mass with the patient's daughter who states that she does not wish to proceed with further biopsy or any intervention for the patient and wishes to leave it alone for now  Paroxysmal Afib On amiodarone, metoprolol.  Not on anticoagulation as outpatient.

## 2022-01-03 DIAGNOSIS — G9341 Metabolic encephalopathy: Secondary | ICD-10-CM | POA: Diagnosis not present

## 2022-01-03 DIAGNOSIS — K219 Gastro-esophageal reflux disease without esophagitis: Secondary | ICD-10-CM | POA: Diagnosis not present

## 2022-01-03 DIAGNOSIS — N3000 Acute cystitis without hematuria: Secondary | ICD-10-CM | POA: Diagnosis not present

## 2022-01-03 DIAGNOSIS — N1831 Chronic kidney disease, stage 3a: Secondary | ICD-10-CM | POA: Diagnosis not present

## 2022-01-03 LAB — CBC
HCT: 37.9 % (ref 36.0–46.0)
Hemoglobin: 11.7 g/dL — ABNORMAL LOW (ref 12.0–15.0)
MCH: 27.4 pg (ref 26.0–34.0)
MCHC: 30.9 g/dL (ref 30.0–36.0)
MCV: 88.8 fL (ref 80.0–100.0)
Platelets: 195 10*3/uL (ref 150–400)
RBC: 4.27 MIL/uL (ref 3.87–5.11)
RDW: 17.7 % — ABNORMAL HIGH (ref 11.5–15.5)
WBC: 6.3 10*3/uL (ref 4.0–10.5)
nRBC: 0 % (ref 0.0–0.2)

## 2022-01-03 LAB — BASIC METABOLIC PANEL
Anion gap: 10 (ref 5–15)
BUN: 15 mg/dL (ref 8–23)
CO2: 22 mmol/L (ref 22–32)
Calcium: 9.4 mg/dL (ref 8.9–10.3)
Chloride: 104 mmol/L (ref 98–111)
Creatinine, Ser: 1.02 mg/dL — ABNORMAL HIGH (ref 0.44–1.00)
GFR, Estimated: 51 mL/min — ABNORMAL LOW (ref >60)
Glucose, Bld: 116 mg/dL — ABNORMAL HIGH (ref 70–99)
Potassium: 4.1 mmol/L (ref 3.5–5.1)
Sodium: 136 mmol/L (ref 135–145)

## 2022-01-03 MED ORDER — POLYETHYLENE GLYCOL 3350 17 G PO PACK
17.0000 g | PACK | Freq: Every day | ORAL | Status: DC
  Filled 2022-01-03: qty 1

## 2022-01-03 MED ORDER — DOCUSATE SODIUM 100 MG PO CAPS
100.0000 mg | ORAL_CAPSULE | Freq: Two times a day (BID) | ORAL | Status: DC
  Filled 2022-01-03: qty 1

## 2022-01-03 MED ORDER — LEVOTHYROXINE SODIUM 75 MCG PO TABS: 75.0000 ug | ORAL_TABLET | Freq: Every day | ORAL | 2 refills | Status: DC

## 2022-01-03 MED ORDER — CEPHALEXIN 500 MG PO CAPS: 500.0000 mg | ORAL_CAPSULE | Freq: Two times a day (BID) | ORAL | 0 refills | Status: AC

## 2022-01-03 NOTE — TOC Transition Note (Addendum)
Transition of Care [facility]) - CM/SW Discharge Note   Patient Details  Name: [name] MRN: [id number] Date of Birth: 1925/03/26  [facility] CM/SW Contact:  Tresa Endo Phone Number: 01/03/2022, 11:49 AM   Clinical Narrative:    Patient will DC to: [name] Anticipated DC date: 01/03/2022 Family notified: Pt Daughter Transport by: Corey Harold   Per MD patient ready for DC to [facility]. RN, patient, patient's family, and facility notified of DC. Discharge Summary sent to facility. DC packet on chart. Ambulance transport requested for patient. Pt daughter specifically requested [facility] bc that is what pt used in the past. CSW informed pt daughter that if insurance does not cover they will receive a bill, daughter understands.  CSW will sign off for now as social work intervention is no longer needed. Please consult [place] again if new needs arise.           Patient Goals and CMS Choice        Discharge Placement                       Discharge Plan and Services                                     Social Determinants of Health (SDOH) Interventions     Readmission Risk Interventions     No data to display

## 2022-01-03 NOTE — Progress Notes (Signed)
  Mobility Specialist Criteria Algorithm Info.   01/03/22 1430  Mobility  Activity Ambulated with assistance in hallway (in recliner before and after ambulation)  Range of Motion/Exercises Active;All extremities  Level of Assistance Standby assist, set-up cues, supervision of patient - no hands on  Assistive Device Front wheel walker  Distance Ambulated (ft) 180 ft  Activity Response Tolerated well   Patient received in recliner chair agreeable to participate in mobility. Ambulated min guard in hallway with slow gait. Returned to room without complaint or incident. Was left in recliner with all needs met, call bell in reach.   01/03/2022 3:42 PM  [name], [name], [name]  DCVUD:[phone] Office: [phone]

## 2022-01-03 NOTE — Evaluation (Signed)
Physical Therapy Evaluation Patient Details Name: [name] MRN: [id number] DOB: 1925-03-06 Today's Date: 01/03/2022  History of Present Illness  Pt is a 88 y.o. female with past medical history of anxiety, history of breast cancer status postmastectomy on the right, heart failure with preserved ejection fraction, GERD, hypertension, hypothyroidism, irritable bowel syndrome, peripheral vascular disease, history of stroke, chronic bronchitis presented to hospital with confusion.  Patient presented from [facility] facility in has had recent few visits to the hospital for chest pain.  Recent CT scan of the chest was negative for PE but mass was reported on the left breast (no intervention planned).  Clinical Impression  Pt agreeable to physical therapy evaluation/treatment session. Pt performing bed mobility at supervision level and transfers plus gait at contact guard assist level. Pt reports LE weakness and fatigue during gait. Pt currently presents with functional limitations secondary to impairments listed in PT problem list. Pt to benefit from skilled, acute care physical therapy interventions to maximize her independence level and quality of life.       Recommendations for follow up therapy are one component of a multi-disciplinary discharge planning process, led by the attending physician.  Recommendations may be updated based on patient status, additional functional criteria and insurance authorization.  Follow Up Recommendations Home health PT      Assistance Recommended at Discharge PRN  Patient can return home with the following  A little help with walking and/or transfers;A little help with bathing/dressing/bathroom;Assist for transportation;Direct supervision/assist for medications management;Assistance with cooking/housework    Equipment Recommendations None recommended by PT  Recommendations for Other Services       Functional Status Assessment Patient has had a recent  decline in their functional status and demonstrates the ability to make significant improvements in function in a reasonable and predictable amount of time.     Precautions / Restrictions Precautions Precautions: Fall Restrictions Weight Bearing Restrictions: No      Mobility  Bed Mobility Overal bed mobility: Needs Assistance Bed Mobility: Supine to Sit     Supine to sit: Supervision          Transfers Overall transfer level: Needs assistance Equipment used: Rolling walker (2 wheels) Transfers: Sit to/from Stand Sit to Stand: Min guard           General transfer comment: Cues for hand placements    Ambulation/Gait Ambulation/Gait assistance: Min guard Gait Distance (Feet): 170 Feet Assistive device: Rolling walker (2 wheels) Gait Pattern/deviations: Trunk flexed, Decreased step length - right, Decreased step length - left       General Gait Details: Pt required cues for pacing. Minimal unsteadiness but no LOB occurred.  Stairs            Wheelchair Mobility    Modified Rankin (Stroke Patients Only)       Balance Overall balance assessment: Mild deficits observed, not formally tested                                           Pertinent Vitals/Pain Pain Assessment Pain Assessment: 0-10 Pain Score: 8  Pain Location: stomach Pain Descriptors / Indicators: Grimacing, Guarding Pain Intervention(s): Limited activity within patient's tolerance, Monitored during session (RN notified)    Home Living Family/patient expects to be discharged to:: Assisted living Living Arrangements: Alone Available Help at Discharge: Available 24 hours/day Type of Home: Assisted living Home Access: Level  entry       Home Layout: One level Home Equipment: Rollator (4 wheels);Wheelchair - Sport and exercise psychologist Comments: Lives at [facility] on third floor with elevator.    Prior Function Prior Level of Function : Independent/Modified  Independent             Mobility Comments: Uses rollator without physical assistance. ADLs Comments: Pt independent with bathing, dressing, feeding, and managing medications. Does not cook or drive.     Hand Dominance   Dominant Hand: Right    Extremity/Trunk Assessment   Upper Extremity Assessment Upper Extremity Assessment: Overall WFL for tasks assessed    Lower Extremity Assessment Lower Extremity Assessment: Overall WFL for tasks assessed (At least 3+/5 in major muscle groups)       Communication   Communication: HOH (Pt does not know where her hearing aids are.)  Cognition Arousal/Alertness: Awake/alert Behavior During Therapy: Flat affect Overall Cognitive Status: Impaired/Different from baseline Area of Impairment: Orientation, Memory, Awareness, Problem solving                 Orientation Level: Disoriented to, Situation, Time   Memory: Decreased short-term memory     Awareness: Intellectual Problem Solving: Requires verbal cues, Requires tactile cues, Slow processing          General Comments      Exercises General Exercises - Lower Extremity Ankle Circles/Pumps: Both, 20 reps, Supine Long Arc Quad: Both, 10 reps, Supine Hip ABduction/ADduction: Both, 10 reps, Supine   Assessment/Plan    PT Assessment Patient needs continued PT services  PT Problem List Decreased strength;Decreased activity tolerance;Decreased balance;Decreased mobility;Decreased cognition;Decreased knowledge of use of DME;Decreased safety awareness;Pain       PT Treatment Interventions DME instruction;Gait training;Functional mobility training;Therapeutic activities;Therapeutic exercise;Balance training;Neuromuscular re-education;Cognitive remediation;Patient/family education    PT Goals (Current goals can be found in the Care Plan section)  Acute Rehab PT Goals Patient Stated Goal: none PT Goal Formulation: With patient Time For Goal Achievement: 01/10/22 Potential  to Achieve Goals: Good    Frequency Min 3X/week     Co-evaluation               AM-PAC PT "6 Clicks" Mobility  Outcome Measure Help needed turning from your back to your side while in a flat bed without using bedrails?: A Little Help needed moving from lying on your back to sitting on the side of a flat bed without using bedrails?: A Little Help needed moving to and from a bed to a chair (including a wheelchair)?: A Little Help needed standing up from a chair using your arms (e.g., wheelchair or bedside chair)?: A Little Help needed to walk in hospital room?: A Little Help needed climbing 3-5 steps with a railing? : A Lot 6 Click Score: 17    End of Session Equipment Utilized During Treatment: Gait belt Activity Tolerance: Patient tolerated treatment well Patient left: in chair;with call bell/phone within reach;with chair alarm set Nurse Communication: Mobility status;Other (comment) (pain) PT Visit Diagnosis: Other abnormalities of gait and mobility (R26.89);Pain;Muscle weakness (generalized) (M62.81)    Time: 7616-0737 PT Time Calculation (min) (ACUTE ONLY): 26 min   Charges:   PT Evaluation $PT Eval Low Complexity: 1 Low PT Treatments $Therapeutic Exercise: 8-22 mins        Donna Bernard, PT   [facility] 01/03/2022, 10:55 AM

## 2022-01-03 NOTE — Progress Notes (Signed)
Pt c/o feeling constipated and is unsure of when her last bowel movement was. Provider paged, see new orders.

## 2022-01-03 NOTE — TOC Progression Note (Addendum)
Transition of Care [facility]) - Progression Note    Patient Details  Name: [name] MRN: [id number] Date of Birth: Nov 04, 1924  [facility]) CM/SW Contact  Ryen Rhames, Edson Snowball, RN Phone Number: 01/03/2022, 3:33 PM  Clinical Narrative:     Olivia Canter to see if they are contracted through a home health agency for HHPT for patient. No answer.    Called and texted [facility] with [facility] , so far no responses.   Called patient's daughter Ivin Booty [phone] .   Ivin Booty aware of above. Her mother had [facility] a year ago . Ivin Booty would like orders faxed to [facility] and she will follow up with [facility].   Orders and clinical faxed to [facility] with [facility] returned call and confirm they provide HHPT at [facility]. She is aware orders were faxed   [place] with [facility] confirmed she received the fax   Expected Discharge Plan and Services           Expected Discharge Date: 01/03/22                                     Social Determinants of Health (SDOH) Interventions    Readmission Risk Interventions     No data to display

## 2022-01-03 NOTE — Discharge Summary (Addendum)
Physician Discharge Summary  [name] READE ZCH:[id number] DOB: 1924/07/22 DOA: 01/01/2022  PCP: [name], MD  Admit date: 01/01/2022 Discharge date: 01/03/2022  Admitted From: Independent living facility  Discharge disposition: Independent living facility  Recommendations for Outpatient Follow-Up:   Follow up with your primary care provider in one week.  Check CBC, BMP, magnesium in the next visit Dose of Synthroid has been increased to 75 mcg due to TSH being high.Will need to do thyroid function test in 4 to 6 weeks. Patient has large breast mass on the left which is likely to be malignant tumor.  Discussed with the patient's daughter and plan is to observe alone at this time.  Discharge Diagnosis:   Principal Problem:   UTI (urinary tract infection) Active Problems:   Hypothyroidism   Essential hypertension   GERD   Chronic kidney disease, stage III (moderate) (HCC)   History of breast cancer   Left breast mass   Acute metabolic encephalopathy  Discharge Condition: Improved.  Diet recommendation: Low sodium, heart healthy.    Wound care: None.  Code status: Partial   History of Present Illness:   [name] is a 88 y.o. female with past medical history of anxiety, history of breast cancer status postmastectomy on the right, heart failure with preserved ejection fraction, GERD, hypertension, hypothyroidism, irritable bowel syndrome, peripheral vascular disease, history of stroke, chronic bronchitis presented to the hospital from independent living facility with confusion.  Patient presented from [facility] facility in has had recent few visits to the hospital for chest pain.  Recent CT scan of the chest was negative for PE but mass was reported on the left breast.  Patient was also noted to be confused.  Patient was unable to tell how long the mass was in the breast.  Urinalysis in the ED showed a large leuks with positive nitrites but has history of positive  nitrates in the past.  MRI of the brain did not show any metastatic disease.  Creatinine was baseline.  WBC was not elevated.  Of note, patient was seen in the ED on 12/20/21 after a fall followed by 12/28/21 and 12/30/21 for chest pain.     Hospital Course:   Following conditions were addressed during hospitalization as listed below,  UTI (urinary tract infection)/Acute metabolic encephalopathy Improved and at baseline.  Urinalysis with positive nitrate and leukocytes.  Received IV Rocephin.  Will be changed to Keflex p.o. for next 3 days on discharge.  Patient is at her baseline at this time.  Ammonia was less than 10. TSH was elevated at 35 from 4.62 years back.  Synthroid has been increased at this time will need outpatient follow-up TSH in 4 to 6 weeks.  Possibility of noncompliance to Synthroid.   Hypothyroidism TSH extremely elevated compared to prior.  Question of compliance.  Spoke with the patient's family about it.  Increased the dose of Synthroid to 75 mcg daily from 50 mcg on discharge with recommendations for outpatient TSH follow-up in 4 to 6 weeks.      Essential hypertension/Chronic HFpEF (not in exacerbation) Blood pressure was elevated on presentation.  Overall improved.  Continue home amlodipine, lasix, hydralazine, IMDUR, losartan, metoprolol on discharge.    GERD Continue PPI.    Chronic kidney disease, stage IIIa (moderate) Creatinine prior to discharge 1.0.  Will need outpatient monitoring.   History of right breast cancer right breast mastectomy. Left chest wall mass seen on imaging.   CTA suggestive of possible  breast cancer.  Possible interstitial lung disease. MRI of the brain without any metastatic disease.  I had a prolonged discussion about the left breast mass with the patient's daughter who stated that she does not wish to proceed with further biopsy or any intervention for the patient and wishes to leave it alone for now.   Paroxysmal Afib On amiodarone,  metoprolol.  Not on anticoagulation as outpatient.  Rate controlled at this time   Disposition.  At this time, patient is stable for disposition back to [facility] independent living facility.  Has been seen by physical therapy prior to discharge.  Medical Consultants:   None  Procedures:    None Subjective:   Today, patient was seen and examined at bedside.  Denies any nausea vomiting fever chills or rigor.  Discharge Exam:   Vitals:   01/03/22 0506 01/03/22 0804  BP: (!) 151/54 (!) 142/102  Pulse: (!) 50 (!) 52  Resp: 20 17  Temp: 97.7 F (36.5 C) 98.2 F (36.8 C)  SpO2: 98% 99%   Vitals:   01/02/22 1236 01/02/22 2121 01/03/22 0506 01/03/22 0804  BP: (!) 159/60 (!) 175/63 (!) 151/54 (!) 142/102  Pulse: (!) 49 (!) 51 (!) 50 (!) 52  Resp: '17 18 20 17  '$ Temp: 97.6 F (36.4 C) 98 F (36.7 C) 97.7 F (36.5 C) 98.2 F (36.8 C)  TempSrc: Oral Oral Oral Oral  SpO2: 100% 100% 98% 99%  Weight:      Height:       General: Alert awake, not in obvious distress elderly female, HENT: pupils equally reacting to light,  No scleral pallor or icterus noted. Oral mucosa is moist.  Chest:  Clear breath sounds.  Diminished breath sounds bilaterally. No crackles or wheezes.  Left breast lump hard in consistency. CVS: S1 &S2 heard. No murmur.  Regular rate and rhythm. Abdomen: Soft, nontender, nondistended.  Bowel sounds are heard.   Extremities: No cyanosis, clubbing or edema.  Peripheral pulses are palpable. Psych: Alert, awake and communicative, CNS:  No cranial nerve deficits.  Moves all extremities Skin: Warm and dry.  No rashes noted.  The results of significant diagnostics from this hospitalization (including imaging, microbiology, ancillary and laboratory) are listed below for reference.     Diagnostic Studies:   MR Brain W and Wo Contrast  Result Date: 01/01/2022 CLINICAL DATA:  Altered mental status. New diagnosis of breast cancer. EXAM: MRI HEAD WITHOUT AND WITH  CONTRAST TECHNIQUE: Multiplanar, multiecho pulse sequences of the brain and surrounding structures were obtained without and with intravenous contrast. CONTRAST:  58m GADAVIST GADOBUTROL 1 MMOL/ML IV SOLN COMPARISON:  CT head without contrast 01/01/2022. MR head without contrast 05/19/2019 FINDINGS: Brain: Moderate atrophy and white matter changes are similar the prior exam. Dilated perivascular spaces are present bilaterally. Remote subcortical white matter infarct is present in the left frontal lobe. Postcontrast images demonstrate no pathologic enhancement to suggest metastatic disease of the brain. The ventricles are of normal size. No significant extraaxial fluid collection is present. A remote lacunar infarct is present in the right thalamus. The internal auditory canals are within normal limits. Remote lacunar infarcts are present in the right cerebellum. Brainstem and cerebellum are otherwise unremarkable. Vascular: Insert normal flow Skull and upper cervical spine: Degenerative changes of the upper cervical spine with grade 1 anterolisthesis at C2-3 again noted. Marked degenerative changes are present at C1-2. The craniocervical junction is normal. Midline structures are unremarkable. Sinuses/Orbits: The paranasal sinuses and mastoid air cells  are clear. Bilateral lens replacements are noted. Globes and orbits are otherwise unremarkable. IMPRESSION: 1. No evidence for metastatic disease to the brain or meninges. 2. No acute intracranial abnormality. 3. Stable moderate atrophy and white matter disease likely reflects the sequela of chronic microvascular ischemia. 4. Remote lacunar infarcts of the right thalamus and right cerebellum. Electronically Signed   By: San Morelle M.D.   On: 01/01/2022 17:17   CT Angio Chest PE W and/or Wo Contrast  Result Date: 01/01/2022 CLINICAL DATA:  Altered mental status following trauma last week. Chest and right arm pain. History of acute pulmonary embolism.  History of rectal and breast cancer. EXAM: CT ANGIOGRAPHY CHEST WITH CONTRAST TECHNIQUE: Multidetector CT imaging of the chest was performed using the standard protocol during bolus administration of intravenous contrast. Multiplanar CT image reconstructions and MIPs were obtained to evaluate the vascular anatomy. RADIATION DOSE REDUCTION: This exam was performed according to the departmental dose-optimization program which includes automated exposure control, adjustment of the mA and/or kV according to patient size and/or use of iterative reconstruction technique. CONTRAST:  73m OMNIPAQUE IOHEXOL 350 MG/ML SOLN COMPARISON:  Chest CT 12/14/2015. FINDINGS: Cardiovascular: The pulmonary arteries are well opacified with contrast to the level of the subsegmental branches. There is no evidence of acute pulmonary embolism. Diffuse atherosclerosis of the aorta, great vessels and coronary arteries. No acute systemic arterial abnormalities are identified. There are calcifications of the aortic valve. The heart is mildly enlarged. No significant pericardial fluid. Mediastinum/Nodes: No enlarged mediastinal, hilar, axillary or internal mammary lymph nodes are identified. There are postsurgical changes in the right axilla. Stable small hiatal hernia. Lungs/Pleura: No pleural effusion or pneumothorax. There is chronic lung disease with subpleural reticulation, central airway thickening and mild mosaic attenuation, likely due to chronic interstitial lung disease. No superimposed airspace disease or suspicious pulmonary nodularity. Upper abdomen: No acute findings are seen within the visualized upper abdomen. There are surgical clips in the gastrohepatic ligament. Musculoskeletal/Chest wall: New soft tissue mass medially in the left breast measuring 2.5 x 2.4 x 2.4 cm, suspicious for malignancy. Previous right mastectomy and breast reconstruction. No acute osseous findings are identified. There are degenerative changes throughout  the spine. Review of the MIP images confirms the above findings. IMPRESSION: 1. No evidence of acute pulmonary embolism or other acute chest findings. 2. New soft tissue mass medially in the left breast, highly worrisome for breast cancer. Correlate clinically and consider biopsy if not recently performed. 3. Stable chronic interstitial lung disease. 4. Coronary and Aortic Atherosclerosis (ICD10-I70.0). Electronically Signed   By: WRichardean SaleM.D.   On: 01/01/2022 12:23   CT HEAD WO CONTRAST (5MM)  Result Date: 01/01/2022 CLINICAL DATA:  Mental status change. Persistent or worsening confusion after trauma last week. EXAM: CT HEAD WITHOUT CONTRAST TECHNIQUE: Contiguous axial images were obtained from the base of the skull through the vertex without intravenous contrast. RADIATION DOSE REDUCTION: This exam was performed according to the departmental dose-optimization program which includes automated exposure control, adjustment of the mA and/or kV according to patient size and/or use of iterative reconstruction technique. COMPARISON:  Head CT 12/20/2021.  MRI head 05/19/2019. FINDINGS: Brain: There is no evidence of acute intracranial hemorrhage, mass lesion, brain edema or extra-axial fluid collection. Stable atrophy with prominence of the ventricles and subarachnoid spaces. Patchy low-density in the periventricular white matter appears unchanged, likely due to chronic small vessel ischemic changes. There is no CT evidence of acute cortical infarction. Vascular: Intracranial vascular calcifications. No hyperdense  vessel identified. Skull: Negative for fracture or focal lesion. Sinuses/Orbits: The visualized paranasal sinuses and mastoid air cells are clear. No orbital abnormalities are seen. Other: Previous lens surgery bilaterally. IMPRESSION: Stable head CT without acute intracranial findings. Chronic small vessel ischemic changes as before. Electronically Signed   By: Richardean Sale M.D.   On: 01/01/2022  12:13   DG Humerus Right  Result Date: 01/01/2022 CLINICAL DATA:  Fall.  Pain.  Altered mental status. EXAM: RIGHT HUMERUS - 2+ VIEW COMPARISON:  None Available. FINDINGS: No fracture. No bone lesion. Skeletal structures are demineralized. Glenohumeral and elbow joints are normally aligned. Multiple surgical vascular clips overlie the right anterolateral chest wall a prior right mastectomy. IMPRESSION: No fracture or dislocation. Electronically Signed   By: Lajean Manes M.D.   On: 01/01/2022 11:59   DG Humerus Left  Result Date: 01/01/2022 CLINICAL DATA:  Fall.  Pain.  Altered mental status. EXAM: LEFT HUMERUS - 2+ VIEW COMPARISON:  Left shoulder radiographs, 12/20/2021. FINDINGS: No fracture. No bone lesion. Skeletal structures are demineralized. Glenohumeral joint is normally aligned, but narrowed with flattening and sclerosis of the subchondral humeral head, and marginal osteophytes, stable from the prior radiographs. Elbow joint normally aligned. Soft tissues are unremarkable. IMPRESSION: 1. No fracture or dislocation. 2. Glenohumeral joint arthropathic changes. Electronically Signed   By: Lajean Manes M.D.   On: 01/01/2022 11:58   DG Knee Complete 4 Views Right  Result Date: 01/01/2022 CLINICAL DATA:  Fall.  Pain.  Altered mental status. EXAM: RIGHT KNEE - COMPLETE 4+ VIEW COMPARISON:  None Available. FINDINGS: No fracture or bone lesion. Skeletal structures are diffusely demineralized. Marked medial joint space compartment narrowing with mild subchondral sclerosis and small marginal osteophytes. Small marginal osteophytes from the lateral compartment and patella. No joint effusion. Posterior arterial vascular calcifications. IMPRESSION: 1. No fracture or acute finding. 2. Osteoarthritis predominantly involving the medial compartment. Electronically Signed   By: Lajean Manes M.D.   On: 01/01/2022 11:57   DG Chest 2 View  Result Date: 01/01/2022 CLINICAL DATA:  Chest pain with shortness of breath.  EXAM: CHEST - 2 VIEW COMPARISON:  Radiographs 12/30/2021 and 12/28/2021.  CT 12/14/2015. FINDINGS: The heart size and mediastinal contours are stable with mild cardiomegaly and aortic atherosclerosis. Stable chronic interstitial prominence in both lung bases. No edema, confluent airspace opacity, pleural effusion or pneumothorax. Postsurgical changes in the right chest wall with old right rib and clavicle deformities. No acute osseous findings. IMPRESSION: Stable chronic cardiomegaly and interstitial prominence. No acute cardiopulmonary process. Electronically Signed   By: Richardean Sale M.D.   On: 01/01/2022 10:34     Labs:   Basic Metabolic Panel: Recent Labs  Lab 12/28/21 0948 12/30/21 0944 12/30/21 1004 01/01/22 1008 01/01/22 2010 01/02/22 0428 01/03/22 0449  NA 139 138 138 135  --  136 136  K 4.5 4.0 3.8 3.9  --  3.8 4.1  CL 104 107 104 101  --  105 104  CO2 21* 22  --  22  --  22 22  GLUCOSE 111* 108* 104* 108*  --  107* 116*  BUN '11 13 14 15  '$ --  13 15  CREATININE 1.13* 1.12* 1.00 1.13* 1.01* 0.84 1.02*  CALCIUM 10.0 9.4  --  10.2  --  9.6 9.4   GFR Estimated Creatinine Clearance: 34.4 mL/min (A) (by C-G formula based on SCr of 1.02 mg/dL (H)). Liver Function Tests: Recent Labs  Lab 12/28/21 0948 01/01/22 1211  AST 27 20  ALT 14 16  ALKPHOS 49 45  BILITOT 1.0 0.8  PROT 6.5 6.3*  ALBUMIN 3.9 3.5   No results for input(s): "LIPASE", "AMYLASE" in the last 168 hours. Recent Labs  Lab 01/01/22 1211  AMMONIA <10   Coagulation profile Recent Labs  Lab 12/30/21 0944  INR 1.0    CBC: Recent Labs  Lab 12/30/21 0944 12/30/21 1004 01/01/22 1008 01/01/22 2010 01/02/22 0428 01/03/22 0449  WBC 5.7  --  6.7 6.2 7.0 6.3  HGB 12.4 13.6 13.5 12.7 12.4 11.7*  HCT 39.5 40.0 41.7 40.4 39.1 37.9  MCV 87.2  --  86.7 88.0 87.9 88.8  PLT 217  --  224 208 201 195   Cardiac Enzymes: No results for input(s): "CKTOTAL", "CKMB", "CKMBINDEX", "TROPONINI" in the last 168  hours. BNP: Invalid input(s): "POCBNP" CBG: Recent Labs  Lab 12/30/21 1008  GLUCAP 124*   D-Dimer No results for input(s): "DDIMER" in the last 72 hours. Hgb A1c No results for input(s): "HGBA1C" in the last 72 hours. Lipid Profile No results for input(s): "CHOL", "HDL", "LDLCALC", "TRIG", "CHOLHDL", "LDLDIRECT" in the last 72 hours. Thyroid function studies Recent Labs    01/01/22 1211  TSH 35.399*   Anemia work up No results for input(s): "VITAMINB12", "FOLATE", "FERRITIN", "TIBC", "IRON", "RETICCTPCT" in the last 72 hours. Microbiology No results found for this or any previous visit (from the past 240 hour(s)).   Discharge Instructions:   Discharge Instructions     Diet - low sodium heart healthy   Complete by: As directed    Discharge instructions   Complete by: As directed    Follow-up with your primary care provider 1 week.  Increase fluid intake.  Complete the course of antibiotic.  Check blood work in the next visit.  Dose of thyroid medication has been increased which needs to be monitored.   Increase activity slowly   Complete by: As directed       Allergies as of 01/03/2022       Reactions   Codeine Itching, Anxiety, Other (See Comments)   Caused the patient to feel nervous/confused, also   Pregabalin Anxiety, Other (See Comments)   Caused nervousness        Medication List     TAKE these medications    acetaminophen 500 MG tablet Commonly known as: TYLENOL Take 1,000 mg by mouth every 6 (six) hours as needed for headache (pain).   amiodarone 200 MG tablet Commonly known as: PACERONE Take 200 mg by mouth daily.   amLODipine 5 MG tablet Commonly known as: NORVASC Take 1 tablet (5 mg total) by mouth daily.   atorvastatin 40 MG tablet Commonly known as: LIPITOR Take 1 tablet (40 mg total) by mouth daily at 6 PM.   cephALEXin 500 MG capsule Commonly known as: KEFLEX Take 1 capsule (500 mg total) by mouth 2 (two) times daily for 3 days.    clopidogrel 75 MG tablet Commonly known as: PLAVIX Take 1 tablet (75 mg total) by mouth daily.   diclofenac sodium 1 % Gel Commonly known as: VOLTAREN APPLY 2-4 GRAMS TO AFFECTED AREA 2-3 TIMES A DAY AS NEEDED. What changed: See the new instructions.   esomeprazole 40 MG capsule Commonly known as: NEXIUM Take 40 mg by mouth daily before breakfast.   furosemide 40 MG tablet Commonly known as: LASIX Take 0.5 tablets (20 mg total) by mouth every Monday, Wednesday, and Friday.   hydrALAZINE 10 MG tablet Commonly known as: APRESOLINE Take 1  tablet (10 mg total) by mouth 2 (two) times daily.   isosorbide mononitrate 30 MG 24 hr tablet Commonly known as: IMDUR Take 1 tablet (30 mg total) by mouth daily.   levothyroxine 75 MCG tablet Commonly known as: SYNTHROID Take 1 tablet (75 mcg total) by mouth daily. What changed:  medication strength how much to take   losartan 25 MG tablet Commonly known as: COZAAR Take 1 tablet (25 mg total) by mouth daily.   melatonin 3 MG Tabs tablet Take 3 mg by mouth at bedtime as needed (sleep).   metoprolol tartrate 25 MG tablet Commonly known as: LOPRESSOR Take 25 mg by mouth 2 (two) times daily.   nitroGLYCERIN 0.4 MG SL tablet Commonly known as: NITROSTAT Place 1 tablet (0.4 mg total) under the tongue every 5 (five) minutes x 3 doses as needed for chest pain.   potassium chloride SA 20 MEQ tablet Commonly known as: Klor-Con M20 Take 1 tablet (20 mEq total) by mouth every Monday, Wednesday, and Friday.   Systane 0.4-0.3 % Gel ophthalmic gel Generic drug: Polyethyl Glycol-Propyl Glycol Place 1 application into both eyes at bedtime.         Time coordinating discharge: 39 minutes  Signed:  Kirsta Probert  [facility] 01/03/2022, 10:06 AM

## 2022-01-04 LAB — URINE CULTURE: Culture: >=100000 — AB

## 2022-01-05 ENCOUNTER — Encounter: Payer: Self-pay | Admitting: *Deleted

## 2022-01-13 DIAGNOSIS — R488 Other symbolic dysfunctions: Secondary | ICD-10-CM | POA: Diagnosis not present

## 2022-01-13 DIAGNOSIS — R41841 Cognitive communication deficit: Secondary | ICD-10-CM | POA: Diagnosis not present

## 2022-01-13 DIAGNOSIS — R2689 Other abnormalities of gait and mobility: Secondary | ICD-10-CM | POA: Diagnosis not present

## 2022-01-13 DIAGNOSIS — M25512 Pain in left shoulder: Secondary | ICD-10-CM | POA: Diagnosis not present

## 2022-01-13 DIAGNOSIS — M6281 Muscle weakness (generalized): Secondary | ICD-10-CM | POA: Diagnosis not present

## 2022-01-14 DIAGNOSIS — R2689 Other abnormalities of gait and mobility: Secondary | ICD-10-CM | POA: Diagnosis not present

## 2022-01-14 DIAGNOSIS — R488 Other symbolic dysfunctions: Secondary | ICD-10-CM | POA: Diagnosis not present

## 2022-01-14 DIAGNOSIS — M6281 Muscle weakness (generalized): Secondary | ICD-10-CM | POA: Diagnosis not present

## 2022-01-14 DIAGNOSIS — M25512 Pain in left shoulder: Secondary | ICD-10-CM | POA: Diagnosis not present

## 2022-01-14 DIAGNOSIS — R41841 Cognitive communication deficit: Secondary | ICD-10-CM | POA: Diagnosis not present

## 2022-01-17 DIAGNOSIS — N390 Urinary tract infection, site not specified: Secondary | ICD-10-CM | POA: Diagnosis not present

## 2022-01-17 DIAGNOSIS — Z853 Personal history of malignant neoplasm of breast: Secondary | ICD-10-CM | POA: Diagnosis not present

## 2022-01-17 DIAGNOSIS — I519 Heart disease, unspecified: Secondary | ICD-10-CM | POA: Diagnosis not present

## 2022-01-17 DIAGNOSIS — E7800 Pure hypercholesterolemia, unspecified: Secondary | ICD-10-CM | POA: Diagnosis not present

## 2022-01-17 DIAGNOSIS — E039 Hypothyroidism, unspecified: Secondary | ICD-10-CM | POA: Diagnosis not present

## 2022-01-17 DIAGNOSIS — N6320 Unspecified lump in the left breast, unspecified quadrant: Secondary | ICD-10-CM | POA: Diagnosis not present

## 2022-01-17 DIAGNOSIS — I480 Paroxysmal atrial fibrillation: Secondary | ICD-10-CM | POA: Diagnosis not present

## 2022-01-18 DIAGNOSIS — M6281 Muscle weakness (generalized): Secondary | ICD-10-CM | POA: Diagnosis not present

## 2022-01-18 DIAGNOSIS — R488 Other symbolic dysfunctions: Secondary | ICD-10-CM | POA: Diagnosis not present

## 2022-01-18 DIAGNOSIS — R2689 Other abnormalities of gait and mobility: Secondary | ICD-10-CM | POA: Diagnosis not present

## 2022-01-18 DIAGNOSIS — R41841 Cognitive communication deficit: Secondary | ICD-10-CM | POA: Diagnosis not present

## 2022-01-18 DIAGNOSIS — M25512 Pain in left shoulder: Secondary | ICD-10-CM | POA: Diagnosis not present

## 2022-01-19 DIAGNOSIS — M25512 Pain in left shoulder: Secondary | ICD-10-CM | POA: Diagnosis not present

## 2022-01-19 DIAGNOSIS — M6281 Muscle weakness (generalized): Secondary | ICD-10-CM | POA: Diagnosis not present

## 2022-01-19 DIAGNOSIS — R41841 Cognitive communication deficit: Secondary | ICD-10-CM | POA: Diagnosis not present

## 2022-01-19 DIAGNOSIS — R2689 Other abnormalities of gait and mobility: Secondary | ICD-10-CM | POA: Diagnosis not present

## 2022-01-19 DIAGNOSIS — R488 Other symbolic dysfunctions: Secondary | ICD-10-CM | POA: Diagnosis not present

## 2022-01-21 DIAGNOSIS — R488 Other symbolic dysfunctions: Secondary | ICD-10-CM | POA: Diagnosis not present

## 2022-01-21 DIAGNOSIS — R2689 Other abnormalities of gait and mobility: Secondary | ICD-10-CM | POA: Diagnosis not present

## 2022-01-21 DIAGNOSIS — M25512 Pain in left shoulder: Secondary | ICD-10-CM | POA: Diagnosis not present

## 2022-01-21 DIAGNOSIS — M6281 Muscle weakness (generalized): Secondary | ICD-10-CM | POA: Diagnosis not present

## 2022-01-21 DIAGNOSIS — R41841 Cognitive communication deficit: Secondary | ICD-10-CM | POA: Diagnosis not present

## 2022-01-24 DIAGNOSIS — M6281 Muscle weakness (generalized): Secondary | ICD-10-CM | POA: Diagnosis not present

## 2022-01-24 DIAGNOSIS — R488 Other symbolic dysfunctions: Secondary | ICD-10-CM | POA: Diagnosis not present

## 2022-01-24 DIAGNOSIS — R41841 Cognitive communication deficit: Secondary | ICD-10-CM | POA: Diagnosis not present

## 2022-01-24 DIAGNOSIS — R2689 Other abnormalities of gait and mobility: Secondary | ICD-10-CM | POA: Diagnosis not present

## 2022-01-24 DIAGNOSIS — M25512 Pain in left shoulder: Secondary | ICD-10-CM | POA: Diagnosis not present

## 2022-01-25 ENCOUNTER — Emergency Department (HOSPITAL_COMMUNITY): Payer: Medicare Other

## 2022-01-25 ENCOUNTER — Emergency Department (HOSPITAL_COMMUNITY)
Admission: EM | Admit: 2022-01-25 | Discharge: 2022-01-25 | Disposition: A | Discharge status: Skilled Nursing Facility | Payer: Medicare Other | Attending: Emergency Medicine | Admitting: Emergency Medicine

## 2022-01-25 DIAGNOSIS — I110 Hypertensive heart disease with heart failure: Secondary | ICD-10-CM | POA: Insufficient documentation

## 2022-01-25 DIAGNOSIS — S3991XA Unspecified injury of abdomen, initial encounter: Secondary | ICD-10-CM | POA: Diagnosis not present

## 2022-01-25 DIAGNOSIS — I509 Heart failure, unspecified: Secondary | ICD-10-CM | POA: Diagnosis not present

## 2022-01-25 DIAGNOSIS — Z85048 Personal history of other malignant neoplasm of rectum, rectosigmoid junction, and anus: Secondary | ICD-10-CM | POA: Diagnosis not present

## 2022-01-25 DIAGNOSIS — R1011 Right upper quadrant pain: Secondary | ICD-10-CM | POA: Diagnosis not present

## 2022-01-25 DIAGNOSIS — Z7901 Long term (current) use of anticoagulants: Secondary | ICD-10-CM | POA: Insufficient documentation

## 2022-01-25 DIAGNOSIS — W1839XA Other fall on same level, initial encounter: Secondary | ICD-10-CM | POA: Diagnosis not present

## 2022-01-25 DIAGNOSIS — R410 Disorientation, unspecified: Secondary | ICD-10-CM | POA: Insufficient documentation

## 2022-01-25 DIAGNOSIS — E039 Hypothyroidism, unspecified: Secondary | ICD-10-CM | POA: Diagnosis not present

## 2022-01-25 DIAGNOSIS — Z79899 Other long term (current) drug therapy: Secondary | ICD-10-CM | POA: Diagnosis not present

## 2022-01-25 DIAGNOSIS — Z85038 Personal history of other malignant neoplasm of large intestine: Secondary | ICD-10-CM | POA: Insufficient documentation

## 2022-01-25 DIAGNOSIS — W19XXXA Unspecified fall, initial encounter: Secondary | ICD-10-CM | POA: Diagnosis not present

## 2022-01-25 DIAGNOSIS — M4312 Spondylolisthesis, cervical region: Secondary | ICD-10-CM | POA: Diagnosis not present

## 2022-01-25 DIAGNOSIS — M25561 Pain in right knee: Secondary | ICD-10-CM | POA: Insufficient documentation

## 2022-01-25 DIAGNOSIS — S0990XA Unspecified injury of head, initial encounter: Secondary | ICD-10-CM | POA: Diagnosis not present

## 2022-01-25 DIAGNOSIS — M1711 Unilateral primary osteoarthritis, right knee: Secondary | ICD-10-CM | POA: Diagnosis not present

## 2022-01-25 DIAGNOSIS — M542 Cervicalgia: Secondary | ICD-10-CM | POA: Insufficient documentation

## 2022-01-25 DIAGNOSIS — R6889 Other general symptoms and signs: Secondary | ICD-10-CM | POA: Diagnosis not present

## 2022-01-25 DIAGNOSIS — Z743 Need for continuous supervision: Secondary | ICD-10-CM | POA: Diagnosis not present

## 2022-01-25 DIAGNOSIS — R4182 Altered mental status, unspecified: Secondary | ICD-10-CM

## 2022-01-25 DIAGNOSIS — Z7401 Bed confinement status: Secondary | ICD-10-CM | POA: Diagnosis not present

## 2022-01-25 DIAGNOSIS — Z043 Encounter for examination and observation following other accident: Secondary | ICD-10-CM | POA: Diagnosis not present

## 2022-01-25 LAB — COMPREHENSIVE METABOLIC PANEL
ALT: 15 U/L (ref 0–44)
AST: 20 U/L (ref 15–41)
Albumin: 3.8 g/dL (ref 3.5–5.0)
Alkaline Phosphatase: 58 U/L (ref 38–126)
Anion gap: 7 (ref 5–15)
BUN: 8 mg/dL (ref 8–23)
CO2: 23 mmol/L (ref 22–32)
Calcium: 9.5 mg/dL (ref 8.9–10.3)
Chloride: 108 mmol/L (ref 98–111)
Creatinine, Ser: 0.86 mg/dL (ref 0.44–1.00)
GFR, Estimated: >60 mL/min (ref >60)
Glucose, Bld: 111 mg/dL — ABNORMAL HIGH (ref 70–99)
Potassium: 3.9 mmol/L (ref 3.5–5.1)
Sodium: 138 mmol/L (ref 135–145)
Total Bilirubin: 0.6 mg/dL (ref 0.3–1.2)
Total Protein: 6.9 g/dL (ref 6.5–8.1)

## 2022-01-25 LAB — CBC WITH DIFFERENTIAL/PLATELET
Abs Immature Granulocytes: 0.03 10*3/uL (ref 0.00–0.07)
Basophils Absolute: 0 10*3/uL (ref 0.0–0.1)
Basophils Relative: 0 %
Eosinophils Absolute: 0.1 10*3/uL (ref 0.0–0.5)
Eosinophils Relative: 1 %
HCT: 40.6 % (ref 36.0–46.0)
Hemoglobin: 12.8 g/dL (ref 12.0–15.0)
Immature Granulocytes: 1 %
Lymphocytes Relative: 13 %
Lymphs Abs: 0.9 10*3/uL (ref 0.7–4.0)
MCH: 27.4 pg (ref 26.0–34.0)
MCHC: 31.5 g/dL (ref 30.0–36.0)
MCV: 86.9 fL (ref 80.0–100.0)
Monocytes Absolute: 0.8 10*3/uL (ref 0.1–1.0)
Monocytes Relative: 13 %
Neutro Abs: 4.7 10*3/uL (ref 1.7–7.7)
Neutrophils Relative %: 72 %
Platelets: 202 10*3/uL (ref 150–400)
RBC: 4.67 MIL/uL (ref 3.87–5.11)
RDW: 17.2 % — ABNORMAL HIGH (ref 11.5–15.5)
WBC: 6.5 10*3/uL (ref 4.0–10.5)
nRBC: 0 % (ref 0.0–0.2)

## 2022-01-25 LAB — URINALYSIS, ROUTINE W REFLEX MICROSCOPIC
Bilirubin Urine: NEGATIVE
Glucose, UA: NEGATIVE mg/dL
Hgb urine dipstick: NEGATIVE
Ketones, ur: NEGATIVE mg/dL
Leukocytes,Ua: NEGATIVE
Nitrite: NEGATIVE
Protein, ur: NEGATIVE mg/dL
Specific Gravity, Urine: 1.011 (ref 1.005–1.030)
pH: 8 (ref 5.0–8.0)

## 2022-01-25 MED ORDER — HYDRALAZINE HCL 20 MG/ML IJ SOLN
10.0000 mg | Freq: Once | INTRAMUSCULAR | Status: AC
  Administered 2022-01-25: 10 mg via INTRAVENOUS
  Filled 2022-01-25: qty 1

## 2022-01-25 MED ORDER — IOHEXOL 300 MG/ML  SOLN
80.0000 mL | Freq: Once | INTRAMUSCULAR | Status: AC | PRN
  Administered 2022-01-25: 80 mL via INTRAVENOUS

## 2022-01-25 NOTE — ED Notes (Signed)
Dinner tray ordered for pt

## 2022-01-25 NOTE — ED Notes (Signed)
Pt provided with crackers and juice per request

## 2022-01-25 NOTE — ED Notes (Signed)
[facility] was called, 12 on list

## 2022-01-25 NOTE — ED Notes (Signed)
Patient transported to X-ray 

## 2022-01-25 NOTE — ED Notes (Signed)
This RN updated by prior RN that patient did not bring any personal belongings to [facility] with her. Patient has no personal belongings (besides shirt/bra/pants/glasses) at bedside with patient upon this RN taking over the shift and care of patient.

## 2022-01-25 NOTE — ED Notes (Signed)
Patient provided with meal tray. Sat up in bed eating
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
## 2022-01-25 NOTE — ED Triage Notes (Signed)
Patient BIB [facility] from [facility] for altered mental status. Patient who is normally AOx4 is on plavix, had unwitnessed fall yesterday at facility and has bruising on left side of face. Patient has no recollection of falling and does not know why she is bruised. Patient is currently oriented to self, time, and location.

## 2022-01-25 NOTE — ED Notes (Signed)
Patient utilized call light to ask RN for water. After being told she needed to wait for test results, patient called again asking for water. Patient states she has no recollection of conversation about water a few minutes prior to her second call.

## 2022-01-25 NOTE — ED Notes (Signed)
Pt given cranberry juice and grahmn crackers.

## 2022-01-25 NOTE — Discharge Instructions (Signed)
The work-up has been reassuring.  No clear source been found.  No UTI and no intracranial injury found.  Initial blood pressures improved.  Follow-up with your doctors.

## 2022-01-26 DIAGNOSIS — R2689 Other abnormalities of gait and mobility: Secondary | ICD-10-CM | POA: Diagnosis not present

## 2022-01-26 DIAGNOSIS — R488 Other symbolic dysfunctions: Secondary | ICD-10-CM | POA: Diagnosis not present

## 2022-01-26 DIAGNOSIS — M25512 Pain in left shoulder: Secondary | ICD-10-CM | POA: Diagnosis not present

## 2022-01-26 DIAGNOSIS — R41841 Cognitive communication deficit: Secondary | ICD-10-CM | POA: Diagnosis not present

## 2022-01-26 DIAGNOSIS — M6281 Muscle weakness (generalized): Secondary | ICD-10-CM | POA: Diagnosis not present

## 2022-01-27 DIAGNOSIS — M6281 Muscle weakness (generalized): Secondary | ICD-10-CM | POA: Diagnosis not present

## 2022-01-27 DIAGNOSIS — R2689 Other abnormalities of gait and mobility: Secondary | ICD-10-CM | POA: Diagnosis not present

## 2022-01-27 DIAGNOSIS — R488 Other symbolic dysfunctions: Secondary | ICD-10-CM | POA: Diagnosis not present

## 2022-01-27 DIAGNOSIS — R41841 Cognitive communication deficit: Secondary | ICD-10-CM | POA: Diagnosis not present

## 2022-01-27 DIAGNOSIS — M25512 Pain in left shoulder: Secondary | ICD-10-CM | POA: Diagnosis not present

## 2022-01-28 DIAGNOSIS — M6281 Muscle weakness (generalized): Secondary | ICD-10-CM | POA: Diagnosis not present

## 2022-01-28 DIAGNOSIS — R41841 Cognitive communication deficit: Secondary | ICD-10-CM | POA: Diagnosis not present

## 2022-01-28 DIAGNOSIS — R2689 Other abnormalities of gait and mobility: Secondary | ICD-10-CM | POA: Diagnosis not present

## 2022-01-28 DIAGNOSIS — M25512 Pain in left shoulder: Secondary | ICD-10-CM | POA: Diagnosis not present

## 2022-01-28 DIAGNOSIS — R488 Other symbolic dysfunctions: Secondary | ICD-10-CM | POA: Diagnosis not present

## 2022-01-31 DIAGNOSIS — M25512 Pain in left shoulder: Secondary | ICD-10-CM | POA: Diagnosis not present

## 2022-01-31 DIAGNOSIS — R2689 Other abnormalities of gait and mobility: Secondary | ICD-10-CM | POA: Diagnosis not present

## 2022-01-31 DIAGNOSIS — M6281 Muscle weakness (generalized): Secondary | ICD-10-CM | POA: Diagnosis not present

## 2022-01-31 DIAGNOSIS — R41841 Cognitive communication deficit: Secondary | ICD-10-CM | POA: Diagnosis not present

## 2022-01-31 DIAGNOSIS — R488 Other symbolic dysfunctions: Secondary | ICD-10-CM | POA: Diagnosis not present

## 2022-02-01 DIAGNOSIS — M25512 Pain in left shoulder: Secondary | ICD-10-CM | POA: Diagnosis not present

## 2022-02-01 DIAGNOSIS — R488 Other symbolic dysfunctions: Secondary | ICD-10-CM | POA: Diagnosis not present

## 2022-02-01 DIAGNOSIS — R2689 Other abnormalities of gait and mobility: Secondary | ICD-10-CM | POA: Diagnosis not present

## 2022-02-01 DIAGNOSIS — R41841 Cognitive communication deficit: Secondary | ICD-10-CM | POA: Diagnosis not present

## 2022-02-01 DIAGNOSIS — M6281 Muscle weakness (generalized): Secondary | ICD-10-CM | POA: Diagnosis not present

## 2022-02-02 DIAGNOSIS — M6281 Muscle weakness (generalized): Secondary | ICD-10-CM | POA: Diagnosis not present

## 2022-02-02 DIAGNOSIS — R2689 Other abnormalities of gait and mobility: Secondary | ICD-10-CM | POA: Diagnosis not present

## 2022-02-02 DIAGNOSIS — R488 Other symbolic dysfunctions: Secondary | ICD-10-CM | POA: Diagnosis not present

## 2022-02-02 DIAGNOSIS — M25512 Pain in left shoulder: Secondary | ICD-10-CM | POA: Diagnosis not present

## 2022-02-02 DIAGNOSIS — R41841 Cognitive communication deficit: Secondary | ICD-10-CM | POA: Diagnosis not present

## 2022-02-04 ENCOUNTER — Other Ambulatory Visit: Payer: Self-pay

## 2022-02-04 ENCOUNTER — Emergency Department (HOSPITAL_COMMUNITY): Payer: Medicare Other

## 2022-02-04 ENCOUNTER — Encounter (HOSPITAL_COMMUNITY): Payer: Self-pay

## 2022-02-04 ENCOUNTER — Inpatient Hospital Stay (HOSPITAL_COMMUNITY)
Admission: EM | Admit: 2022-02-04 | Discharge: 2022-02-07 | DRG: 291 | Disposition: A | Discharge status: Home / Self Care | Payer: Medicare Other | Source: Skilled Nursing Facility | Attending: Internal Medicine | Admitting: Internal Medicine

## 2022-02-04 DIAGNOSIS — I10 Essential (primary) hypertension: Secondary | ICD-10-CM | POA: Diagnosis not present

## 2022-02-04 DIAGNOSIS — I5033 Acute on chronic diastolic (congestive) heart failure: Secondary | ICD-10-CM | POA: Diagnosis not present

## 2022-02-04 DIAGNOSIS — I447 Left bundle-branch block, unspecified: Secondary | ICD-10-CM | POA: Diagnosis not present

## 2022-02-04 DIAGNOSIS — F419 Anxiety disorder, unspecified: Secondary | ICD-10-CM | POA: Diagnosis not present

## 2022-02-04 DIAGNOSIS — Z79890 Hormone replacement therapy (postmenopausal): Secondary | ICD-10-CM | POA: Diagnosis not present

## 2022-02-04 DIAGNOSIS — Z85048 Personal history of other malignant neoplasm of rectum, rectosigmoid junction, and anus: Secondary | ICD-10-CM

## 2022-02-04 DIAGNOSIS — I2510 Atherosclerotic heart disease of native coronary artery without angina pectoris: Secondary | ICD-10-CM | POA: Diagnosis present

## 2022-02-04 DIAGNOSIS — Z9842 Cataract extraction status, left eye: Secondary | ICD-10-CM

## 2022-02-04 DIAGNOSIS — Z9049 Acquired absence of other specified parts of digestive tract: Secondary | ICD-10-CM

## 2022-02-04 DIAGNOSIS — R0602 Shortness of breath: Secondary | ICD-10-CM | POA: Diagnosis not present

## 2022-02-04 DIAGNOSIS — M25512 Pain in left shoulder: Secondary | ICD-10-CM | POA: Diagnosis not present

## 2022-02-04 DIAGNOSIS — I480 Paroxysmal atrial fibrillation: Secondary | ICD-10-CM | POA: Diagnosis present

## 2022-02-04 DIAGNOSIS — M8580 Other specified disorders of bone density and structure, unspecified site: Secondary | ICD-10-CM | POA: Diagnosis present

## 2022-02-04 DIAGNOSIS — Z888 Allergy status to other drugs, medicaments and biological substances status: Secondary | ICD-10-CM

## 2022-02-04 DIAGNOSIS — K449 Diaphragmatic hernia without obstruction or gangrene: Secondary | ICD-10-CM | POA: Diagnosis not present

## 2022-02-04 DIAGNOSIS — E039 Hypothyroidism, unspecified: Secondary | ICD-10-CM | POA: Diagnosis present

## 2022-02-04 DIAGNOSIS — R410 Disorientation, unspecified: Secondary | ICD-10-CM | POA: Diagnosis not present

## 2022-02-04 DIAGNOSIS — Z853 Personal history of malignant neoplasm of breast: Secondary | ICD-10-CM

## 2022-02-04 DIAGNOSIS — Z79899 Other long term (current) drug therapy: Secondary | ICD-10-CM

## 2022-02-04 DIAGNOSIS — R001 Bradycardia, unspecified: Secondary | ICD-10-CM | POA: Diagnosis present

## 2022-02-04 DIAGNOSIS — R0789 Other chest pain: Secondary | ICD-10-CM | POA: Diagnosis not present

## 2022-02-04 DIAGNOSIS — Z885 Allergy status to narcotic agent status: Secondary | ICD-10-CM

## 2022-02-04 DIAGNOSIS — I169 Hypertensive crisis, unspecified: Secondary | ICD-10-CM | POA: Diagnosis not present

## 2022-02-04 DIAGNOSIS — K589 Irritable bowel syndrome without diarrhea: Secondary | ICD-10-CM | POA: Diagnosis present

## 2022-02-04 DIAGNOSIS — Z9011 Acquired absence of right breast and nipple: Secondary | ICD-10-CM

## 2022-02-04 DIAGNOSIS — Z96652 Presence of left artificial knee joint: Secondary | ICD-10-CM | POA: Diagnosis present

## 2022-02-04 DIAGNOSIS — R6889 Other general symptoms and signs: Secondary | ICD-10-CM | POA: Diagnosis not present

## 2022-02-04 DIAGNOSIS — Z743 Need for continuous supervision: Secondary | ICD-10-CM | POA: Diagnosis not present

## 2022-02-04 DIAGNOSIS — Z7902 Long term (current) use of antithrombotics/antiplatelets: Secondary | ICD-10-CM | POA: Diagnosis not present

## 2022-02-04 DIAGNOSIS — Z9841 Cataract extraction status, right eye: Secondary | ICD-10-CM

## 2022-02-04 DIAGNOSIS — I739 Peripheral vascular disease, unspecified: Secondary | ICD-10-CM | POA: Diagnosis not present

## 2022-02-04 DIAGNOSIS — R41841 Cognitive communication deficit: Secondary | ICD-10-CM | POA: Diagnosis not present

## 2022-02-04 DIAGNOSIS — I110 Hypertensive heart disease with heart failure: Principal | ICD-10-CM | POA: Diagnosis present

## 2022-02-04 DIAGNOSIS — N6320 Unspecified lump in the left breast, unspecified quadrant: Secondary | ICD-10-CM | POA: Diagnosis present

## 2022-02-04 DIAGNOSIS — Z66 Do not resuscitate: Secondary | ICD-10-CM | POA: Diagnosis present

## 2022-02-04 DIAGNOSIS — R2689 Other abnormalities of gait and mobility: Secondary | ICD-10-CM | POA: Diagnosis not present

## 2022-02-04 DIAGNOSIS — Z90710 Acquired absence of both cervix and uterus: Secondary | ICD-10-CM

## 2022-02-04 DIAGNOSIS — I161 Hypertensive emergency: Secondary | ICD-10-CM | POA: Diagnosis not present

## 2022-02-04 DIAGNOSIS — K219 Gastro-esophageal reflux disease without esophagitis: Secondary | ICD-10-CM | POA: Diagnosis present

## 2022-02-04 DIAGNOSIS — I872 Venous insufficiency (chronic) (peripheral): Secondary | ICD-10-CM | POA: Diagnosis present

## 2022-02-04 DIAGNOSIS — R079 Chest pain, unspecified: Secondary | ICD-10-CM | POA: Diagnosis not present

## 2022-02-04 DIAGNOSIS — Z961 Presence of intraocular lens: Secondary | ICD-10-CM | POA: Diagnosis present

## 2022-02-04 DIAGNOSIS — Z7401 Bed confinement status: Secondary | ICD-10-CM | POA: Diagnosis not present

## 2022-02-04 DIAGNOSIS — Z8673 Personal history of transient ischemic attack (TIA), and cerebral infarction without residual deficits: Secondary | ICD-10-CM

## 2022-02-04 DIAGNOSIS — I509 Heart failure, unspecified: Secondary | ICD-10-CM | POA: Diagnosis not present

## 2022-02-04 DIAGNOSIS — E785 Hyperlipidemia, unspecified: Secondary | ICD-10-CM | POA: Diagnosis present

## 2022-02-04 DIAGNOSIS — R488 Other symbolic dysfunctions: Secondary | ICD-10-CM | POA: Diagnosis not present

## 2022-02-04 DIAGNOSIS — J8410 Pulmonary fibrosis, unspecified: Secondary | ICD-10-CM | POA: Diagnosis not present

## 2022-02-04 DIAGNOSIS — Z8249 Family history of ischemic heart disease and other diseases of the circulatory system: Secondary | ICD-10-CM

## 2022-02-04 DIAGNOSIS — M6281 Muscle weakness (generalized): Secondary | ICD-10-CM | POA: Diagnosis not present

## 2022-02-04 DIAGNOSIS — R519 Headache, unspecified: Secondary | ICD-10-CM | POA: Diagnosis not present

## 2022-02-04 LAB — CBC WITH DIFFERENTIAL/PLATELET
Abs Immature Granulocytes: 0.05 10*3/uL (ref 0.00–0.07)
Basophils Absolute: 0 10*3/uL (ref 0.0–0.1)
Basophils Relative: 0 %
Eosinophils Absolute: 0.1 10*3/uL (ref 0.0–0.5)
Eosinophils Relative: 1 %
HCT: 39.6 % (ref 36.0–46.0)
Hemoglobin: 12.2 g/dL (ref 12.0–15.0)
Immature Granulocytes: 1 %
Lymphocytes Relative: 28 %
Lymphs Abs: 2.1 10*3/uL (ref 0.7–4.0)
MCH: 27.1 pg (ref 26.0–34.0)
MCHC: 30.8 g/dL (ref 30.0–36.0)
MCV: 87.8 fL (ref 80.0–100.0)
Monocytes Absolute: 0.9 10*3/uL (ref 0.1–1.0)
Monocytes Relative: 12 %
Neutro Abs: 4.4 10*3/uL (ref 1.7–7.7)
Neutrophils Relative %: 58 %
Platelets: 232 10*3/uL (ref 150–400)
RBC: 4.51 MIL/uL (ref 3.87–5.11)
RDW: 17.4 % — ABNORMAL HIGH (ref 11.5–15.5)
WBC: 7.6 10*3/uL (ref 4.0–10.5)
nRBC: 0 % (ref 0.0–0.2)

## 2022-02-04 LAB — TROPONIN I (HIGH SENSITIVITY)
Troponin I (High Sensitivity): 17 ng/L (ref <18)
Troponin I (High Sensitivity): 18 ng/L — ABNORMAL HIGH (ref <18)

## 2022-02-04 LAB — GLUCOSE, CAPILLARY: Glucose-Capillary: 104 mg/dL — ABNORMAL HIGH (ref 70–99)

## 2022-02-04 LAB — D-DIMER, QUANTITATIVE: D-Dimer, Quant: 0.64 ug/mL-FEU — ABNORMAL HIGH (ref 0.00–0.50)

## 2022-02-04 LAB — BASIC METABOLIC PANEL
Anion gap: 7 (ref 5–15)
BUN: 10 mg/dL (ref 8–23)
CO2: 23 mmol/L (ref 22–32)
Calcium: 9.2 mg/dL (ref 8.9–10.3)
Chloride: 105 mmol/L (ref 98–111)
Creatinine, Ser: 1.03 mg/dL — ABNORMAL HIGH (ref 0.44–1.00)
GFR, Estimated: 50 mL/min — ABNORMAL LOW (ref >60)
Glucose, Bld: 77 mg/dL (ref 70–99)
Potassium: 4.1 mmol/L (ref 3.5–5.1)
Sodium: 135 mmol/L (ref 135–145)

## 2022-02-04 LAB — BRAIN NATRIURETIC PEPTIDE: B Natriuretic Peptide: 652.9 pg/mL — ABNORMAL HIGH (ref 0.0–100.0)

## 2022-02-04 MED ORDER — AMLODIPINE BESYLATE 5 MG PO TABS
5.0000 mg | ORAL_TABLET | Freq: Every day | ORAL | Status: DC
  Filled 2022-02-04 (×2): qty 1

## 2022-02-04 MED ORDER — ACETAMINOPHEN 650 MG RE SUPP: 650.0000 mg | Freq: Four times a day (QID) | RECTAL | Status: DC | PRN

## 2022-02-04 MED ORDER — ONDANSETRON HCL 4 MG/2ML IJ SOLN: 4.0000 mg | Freq: Four times a day (QID) | INTRAMUSCULAR | Status: DC | PRN

## 2022-02-04 MED ORDER — ATORVASTATIN CALCIUM 40 MG PO TABS
40.0000 mg | ORAL_TABLET | Freq: Every day | ORAL | Status: DC
Start: 2022-02-05 — End: 2022-02-07
  Administered 2022-02-05 – 2022-02-07 (×3): 40 mg via ORAL
  Filled 2022-02-04 (×3): qty 1

## 2022-02-04 MED ORDER — SENNOSIDES-DOCUSATE SODIUM 8.6-50 MG PO TABS
1.0000 | ORAL_TABLET | Freq: Every evening | ORAL | Status: DC | PRN
Start: 2022-02-04 — End: 2022-02-07
  Administered 2022-02-06: 1 via ORAL
  Filled 2022-02-04: qty 1

## 2022-02-04 MED ORDER — CLOPIDOGREL BISULFATE 75 MG PO TABS
75.0000 mg | ORAL_TABLET | Freq: Every day | ORAL | Status: DC
  Administered 2022-02-05 – 2022-02-07 (×3): 75 mg via ORAL
  Filled 2022-02-04 (×3): qty 1

## 2022-02-04 MED ORDER — ONDANSETRON HCL 4 MG PO TABS: 4.0000 mg | ORAL_TABLET | Freq: Four times a day (QID) | ORAL | Status: DC | PRN

## 2022-02-04 MED ORDER — FUROSEMIDE 10 MG/ML IJ SOLN
40.0000 mg | Freq: Two times a day (BID) | INTRAMUSCULAR | Status: DC
  Administered 2022-02-04: 40 mg via INTRAVENOUS
  Filled 2022-02-04 (×2): qty 4

## 2022-02-04 MED ORDER — HYDRALAZINE HCL 10 MG PO TABS
10.0000 mg | ORAL_TABLET | Freq: Two times a day (BID) | ORAL | Status: DC
  Administered 2022-02-04 – 2022-02-07 (×5): 10 mg via ORAL
  Filled 2022-02-04 (×6): qty 1

## 2022-02-04 MED ORDER — METOPROLOL TARTRATE 25 MG PO TABS
25.0000 mg | ORAL_TABLET | Freq: Two times a day (BID) | ORAL | Status: DC
  Administered 2022-02-04 – 2022-02-07 (×3): 25 mg via ORAL
  Filled 2022-02-04 (×5): qty 1

## 2022-02-04 MED ORDER — ISOSORBIDE MONONITRATE ER 30 MG PO TB24: 30.0000 mg | ORAL_TABLET | Freq: Every day | ORAL | Status: DC

## 2022-02-04 MED ORDER — HYDRALAZINE HCL 20 MG/ML IJ SOLN
10.0000 mg | INTRAMUSCULAR | Status: DC | PRN
Start: 2022-02-04 — End: 2022-02-07
  Administered 2022-02-04: 10 mg via INTRAVENOUS
  Filled 2022-02-04: qty 1

## 2022-02-04 MED ORDER — PANTOPRAZOLE SODIUM 40 MG PO TBEC
40.0000 mg | DELAYED_RELEASE_TABLET | Freq: Every day | ORAL | Status: DC
  Administered 2022-02-05 – 2022-02-07 (×3): 40 mg via ORAL
  Filled 2022-02-04 (×3): qty 1

## 2022-02-04 MED ORDER — ACETAMINOPHEN 325 MG PO TABS
650.0000 mg | ORAL_TABLET | Freq: Four times a day (QID) | ORAL | Status: DC | PRN
  Administered 2022-02-04 – 2022-02-06 (×4): 650 mg via ORAL
  Filled 2022-02-04 (×4): qty 2

## 2022-02-04 MED ORDER — LEVOTHYROXINE SODIUM 75 MCG PO TABS
75.0000 ug | ORAL_TABLET | Freq: Every day | ORAL | Status: DC
  Administered 2022-02-05 – 2022-02-07 (×3): 75 ug via ORAL
  Filled 2022-02-04 (×3): qty 1

## 2022-02-04 MED ORDER — LOSARTAN POTASSIUM 50 MG PO TABS: 25.0000 mg | ORAL_TABLET | Freq: Every day | ORAL | Status: DC

## 2022-02-04 MED ORDER — AMIODARONE HCL 200 MG PO TABS
200.0000 mg | ORAL_TABLET | Freq: Every day | ORAL | Status: DC
  Administered 2022-02-06 – 2022-02-07 (×2): 200 mg via ORAL
  Filled 2022-02-04 (×3): qty 1

## 2022-02-04 MED ORDER — HEPARIN SODIUM (PORCINE) 5000 UNIT/ML IJ SOLN
5000.0000 [IU] | Freq: Three times a day (TID) | INTRAMUSCULAR | Status: DC
  Administered 2022-02-04 – 2022-02-07 (×9): 5000 [IU] via SUBCUTANEOUS
  Filled 2022-02-04 (×9): qty 1

## 2022-02-04 MED ORDER — HYDRALAZINE HCL 20 MG/ML IJ SOLN
10.0000 mg | Freq: Once | INTRAMUSCULAR | Status: AC
  Administered 2022-02-04: 10 mg via INTRAVENOUS
  Filled 2022-02-04: qty 1

## 2022-02-04 MED ORDER — POTASSIUM CHLORIDE CRYS ER 20 MEQ PO TBCR: 20.0000 meq | EXTENDED_RELEASE_TABLET | ORAL | Status: DC

## 2022-02-04 MED ORDER — MELATONIN 3 MG PO TABS
3.0000 mg | ORAL_TABLET | Freq: Every evening | ORAL | Status: DC | PRN
  Administered 2022-02-04 – 2022-02-06 (×3): 3 mg via ORAL
  Filled 2022-02-04 (×3): qty 1

## 2022-02-04 MED ORDER — IOHEXOL 350 MG/ML SOLN
70.0000 mL | Freq: Once | INTRAVENOUS | Status: AC | PRN
  Administered 2022-02-04: 70 mL via INTRAVENOUS

## 2022-02-04 MED ORDER — SODIUM CHLORIDE 0.9% FLUSH
3.0000 mL | Freq: Two times a day (BID) | INTRAVENOUS | Status: DC
  Administered 2022-02-04 – 2022-02-07 (×6): 3 mL via INTRAVENOUS

## 2022-02-04 MED ORDER — LABETALOL HCL 5 MG/ML IV SOLN
10.0000 mg | Freq: Once | INTRAVENOUS | Status: AC
  Administered 2022-02-04: 10 mg via INTRAVENOUS
  Filled 2022-02-04: qty 4

## 2022-02-04 NOTE — Assessment & Plan Note (Signed)
Continue Synthroid °

## 2022-02-04 NOTE — ED Triage Notes (Signed)
Pt received via EMS from [facility]. Pt c/o sudden onset of shortness of breath.

## 2022-02-04 NOTE — ED Notes (Signed)
Per EMS report pt c/o shortness of breath while getting her hair done. Pt alert and oriented to person, DOB, place and time. Pt h&P obtained. Pt difficulty hearing. Pt states "I don't know" to multiple questions. Pt requesting something to eat and drink.

## 2022-02-04 NOTE — ED Provider Notes (Signed)
[facility] DEPARTMENT Provider Note   CSN: [id number] Arrival date & time: 02/04/22  1433     History  Chief Complaint  Patient presents with   Shortness of [name] is a 88 y.o. female.  The history is provided by the patient and medical records. No language interpreter was used.  Shortness of Breath    [name] is a 88 yo F w/ hx of hypothyroidism, anxiety, htn, diastolic dysfunction, venous insufficiency, and GERD presents to the ED for shortness of breath.  She says she was short of breath today, more so that she typically is. She endorses some chest pain as well. At the time of exam, she is not SOB or having chest pain. She denies abd pain, n/v or back pain. Denies recent illness, chills or fever.  DDX: CHF exacerbation, pleural effusion, MI, dysrhythmia, pneumonia, PE  Home Medications Prior to Admission medications   Medication Sig Start Date End Date Taking? Authorizing Provider  acetaminophen (TYLENOL) 500 MG tablet Take 1,000 mg by mouth every 6 (six) hours as needed for headache (pain).    [provider]  amiodarone (PACERONE) 200 MG tablet Take 200 mg by mouth daily at 12 noon. 12/11/21   [provider]  amLODipine (NORVASC) 5 MG tablet Take 1 tablet (5 mg total) by mouth daily. Patient taking differently: Take 5 mg by mouth daily at 12 noon. 05/19/19   Domenic Polite, MD  atorvastatin (LIPITOR) 40 MG tablet Take 1 tablet (40 mg total) by mouth daily at 6 PM. Patient taking differently: Take 40 mg by mouth every evening. 09/02/19   Dixie Dials, MD  clopidogrel (PLAVIX) 75 MG tablet Take 1 tablet (75 mg total) by mouth daily. Patient taking differently: Take 75 mg by mouth daily at 12 noon. 09/02/19   Dixie Dials, MD  diclofenac sodium (VOLTAREN) 1 % GEL APPLY 2-4 GRAMS TO AFFECTED AREA 2-3 TIMES A DAY AS NEEDED. Patient taking differently: Apply 2-4 g topically 2 (two) times daily as needed (joint pain).  04/30/19   Meredith Pel, MD  esomeprazole (NEXIUM) 40 MG capsule Take 40 mg by mouth every morning.    [provider]  furosemide (LASIX) 40 MG tablet Take 0.5 tablets (20 mg total) by mouth every Monday, Wednesday, and Friday. Patient taking differently: Take 40 mg by mouth daily at 12 noon. 12/03/21   Dixie Dials, MD  hydrALAZINE (APRESOLINE) 10 MG tablet Take 1 tablet (10 mg total) by mouth 2 (two) times daily. Patient not taking: Reported on 01/25/2022 12/02/21   Dixie Dials, MD  isosorbide mononitrate (IMDUR) 30 MG 24 hr tablet Take 1 tablet (30 mg total) by mouth daily. Patient taking differently: Take 30 mg by mouth daily at 12 noon. 09/02/19   Dixie Dials, MD  levothyroxine (SYNTHROID) 75 MCG tablet Take 1 tablet (75 mcg total) by mouth daily. Patient taking differently: Take 75 mcg by mouth every morning. 01/03/22   Pokhrel, Corrie Mckusick, MD  losartan (COZAAR) 25 MG tablet Take 1 tablet (25 mg total) by mouth daily. Patient taking differently: Take 25 mg by mouth daily at 12 noon. 09/03/19   Dixie Dials, MD  Melatonin 3 MG TABS Take 3 mg by mouth at bedtime as needed (sleep).     [provider]  metoprolol tartrate (LOPRESSOR) 25 MG tablet Take 25 mg by mouth See admin instructions. Take one tablet (25 mg) by mouth twice daily - noon and evening 12/11/21  [provider]  nitroGLYCERIN (NITROSTAT) 0.4 MG SL tablet Place 1 tablet (0.4 mg total) under the tongue every 5 (five) minutes x 3 doses as needed for chest pain. Patient not taking: Reported on 01/25/2022 09/02/19   Dixie Dials, MD  Polyethyl Glycol-Propyl Glycol (SYSTANE) 0.4-0.3 % GEL ophthalmic gel Place 1 application into both eyes at bedtime.    [provider]  potassium chloride SA (KLOR-CON M20) 20 MEQ tablet Take 1 tablet (20 mEq total) by mouth every Monday, Wednesday, and Friday. Patient taking differently: Take 20 mEq by mouth daily at 12 noon. 12/03/21   Dixie Dials, MD       Allergies    Codeine and Pregabalin    Review of Systems   Review of Systems  Respiratory:  Positive for shortness of breath.   All other systems reviewed and are negative.   Physical Exam Updated Vital Signs BP (!) 229/88   Pulse 63   Temp 98 F (36.7 C) (Oral)   Resp (!) 23   Ht '5\' 6"'$  (1.676 m)   Wt 79.4 kg   SpO2 100%   BMI 28.25 kg/m  Physical Exam Vitals and nursing note reviewed.  Constitutional:      General: She is not in acute distress.    Appearance: She is well-developed.  HENT:     Head: Atraumatic.  Eyes:     Conjunctiva/sclera: Conjunctivae normal.  Cardiovascular:     Rate and Rhythm: Normal rate and regular rhythm.  Pulmonary:     Effort: Pulmonary effort is normal.     Breath sounds: Examination of the right-lower field reveals rales. Examination of the left-lower field reveals rales. Rales present.  Chest:     Chest wall: No tenderness.  Musculoskeletal:     Cervical back: Neck supple.     Right lower leg: Edema present.     Left lower leg: Edema present.  Skin:    Findings: No rash.  Neurological:     Mental Status: She is alert. She is disoriented.  Psychiatric:        Mood and Affect: Mood normal.    ED Results / Procedures / Treatments   Labs (all labs ordered are listed, but only abnormal results are displayed) Labs Reviewed  BASIC METABOLIC PANEL - Abnormal; Notable for the following components:      Result Value   Creatinine, Ser 1.03 (*)    GFR, Estimated 50 (*)    All other components within normal limits  CBC WITH DIFFERENTIAL/PLATELET - Abnormal; Notable for the following components:   RDW 17.4 (*)    All other components within normal limits  BRAIN NATRIURETIC PEPTIDE - Abnormal; Notable for the following components:   B Natriuretic Peptide 652.9 (*)    All other components within normal limits  D-DIMER, QUANTITATIVE - Abnormal; Notable for the following components:   D-Dimer, Quant 0.64 (*)    All other components  within normal limits  TROPONIN I (HIGH SENSITIVITY) - Abnormal; Notable for the following components:   Troponin I (High Sensitivity) 18 (*)    All other components within normal limits  TROPONIN I (HIGH SENSITIVITY)    EKG EKG Interpretation  Date/Time:  Friday February 04 2022 15:22:59 EDT Ventricular Rate:  61 PR Interval:  269 QRS Duration: 167 QT Interval:  475 QTC Calculation: 479 R Axis:   -43 Text Interpretation: Sinus rhythm Prolonged PR interval Left bundle branch block Borderline EKG Concern for Sgarbossa discordant anterior leads V1, V3, V4  Confirmed by Georgina Snell ([id number]) on 02/04/2022 5:09:30 PM  Radiology CT Angio Chest PE W and/or Wo Contrast  Result Date: 02/04/2022 CLINICAL DATA:  PE suspected, sudden shortness of breath, history of breast cancer and rectal cancer * Tracking Code: BO * EXAM: CT ANGIOGRAPHY CHEST WITH CONTRAST TECHNIQUE: Multidetector CT imaging of the chest was performed using the standard protocol during bolus administration of intravenous contrast. Multiplanar CT image reconstructions and MIPs were obtained to evaluate the vascular anatomy. RADIATION DOSE REDUCTION: This exam was performed according to the departmental dose-optimization program which includes automated exposure control, adjustment of the mA and/or kV according to patient size and/or use of iterative reconstruction technique. CONTRAST:  60m OMNIPAQUE IOHEXOL 350 MG/ML SOLN COMPARISON:  01/01/2022 FINDINGS: Cardiovascular: Satisfactory opacification of the pulmonary arteries to the segmental level. No evidence of pulmonary embolism. Cardiomegaly. Extensive three-vessel coronary artery calcifications and/or stents. No pericardial effusion. Aortic atherosclerosis. Aortic valve calcifications. Mediastinum/Nodes: No enlarged mediastinal, hilar, or axillary lymph nodes. Moderate hiatal hernia. Thyroid gland, trachea, and esophagus demonstrate no significant findings. Lungs/Pleura: Mild pulmonary  fibrosis in a pattern with apical to basal gradient, featuring irregular peripheral interstitial opacity and septal thickening at the lung bases. No pleural effusion or pneumothorax. Upper Abdomen: No acute abnormality. Musculoskeletal: Status post right mastectomy. Redemonstrated, lobulated mass of the medial left breast overlying the left pectoralis major measuring 2.7 x 2.1 cm (series 7, image 138). No acute osseous findings. Review of the MIP images confirms the above findings. IMPRESSION: 1. Negative examination for pulmonary embolism. 2. Cardiomegaly and coronary artery disease. 3. Aortic valve calcifications. Correlate for echocardiographic evidence of aortic valve dysfunction. 4. Mild pulmonary fibrosis in a pattern with apical to basal gradient, featuring irregular peripheral interstitial opacity and septal thickening at the lung bases. This could be further characterized by interstitial lung disease protocol CT on a nonemergent, outpatient basis if clinically appropriate. 5. Redemonstrated, lobulated mass of the medial left breast, concerning for primary breast malignancy. 6. Hiatal hernia. Aortic Atherosclerosis (ICD10-I70.0). Electronically Signed   By: ADelanna AhmadiM.D.   On: 02/04/2022 19:23   DG Chest 2 View  Result Date: 02/04/2022 CLINICAL DATA:  Shortness of breath EXAM: CHEST - 2 VIEW COMPARISON:  01/25/2022 FINDINGS: Cardiac shadow is enlarged. Aortic calcifications are noted. Postsurgical changes are again noted. Lungs are well aerated without focal infiltrate or sizable effusion. No acute bony abnormality is noted. IMPRESSION: No acute abnormality noted. Electronically Signed   By: MInez CatalinaM.D.   On: 02/04/2022 16:27    Procedures .Critical Care  Performed by: TDomenic Moras PA-C Authorized by: TDomenic Moras PA-C   Critical care provider statement:    Critical care time (minutes):  30   Critical care was time spent personally by me on the following activities:  Development of  treatment plan with patient or surrogate, discussions with consultants, evaluation of patient's response to treatment, examination of patient, ordering and review of laboratory studies, ordering and review of radiographic studies, ordering and performing treatments and interventions, pulse oximetry, re-evaluation of patient's condition and review of old charts     Medications Ordered in ED Medications  hydrALAZINE (APRESOLINE) injection 10 mg (10 mg Intravenous Given 02/04/22 1907)  iohexol (OMNIPAQUE) 350 MG/ML injection 70 mL (70 mLs Intravenous Contrast Given 02/04/22 1850)  labetalol (NORMODYNE) injection 10 mg (10 mg Intravenous Given 02/04/22 1959)    ED Course/ Medical Decision Making/ A&P Clinical Course as of 02/04/22 1728  Fri Feb 04, 2022  1707 Discussed case  with PA Domenic Moras who is managing this patient, evaluate the patient, she is demented and unable to provide reliable history but does note chest pain.  Blood pressure elevated 229/88.  EKG with known left bundle branch block almost meeting Sgarbossa criteria.  Troponin is pending.  Chest x-ray with no consolidation concerning for pneumonia, no pneumothorax or pulmonary edema. APP will reach out to Cardiology to evaluate EKG and get rpt EKG [VB]    Clinical Course User Index [VB] Elgie Congo, MD                           Medical Decision Making Amount and/or Complexity of Data Reviewed Labs: ordered. Radiology: ordered.  Risk Prescription drug management.   BP (!) 229/88   Pulse 63   Temp 98 F (36.7 C) (Oral)   Resp (!) 23   Ht '5\' 6"'$  (1.676 m)   Wt 79.4 kg   SpO2 100%   BMI 28.25 kg/m   4:47 PM This is a 88 year old female with significant history of CHF, hypertension, nonmetastatic breast cancer who was sent here via EMS from [place] facility for shortness of breath.  Patient is a poor historian and is hard of hearing.  History is difficult to obtain.  Patient endorsed having worsening  shortness of breath that started earlier today.  She has had similar shortness of breath in the past.  Shortness of breath seems to be worse with laying down.  She does endorse some pain in her chest along with shortness of breath.  She does not endorse any fever or chills and denies having productive cough.  She does not endorse any abdominal pain nausea vomiting or diarrhea.  On exam, patient is resting comfortably appears to be in no acute discomfort.  She has a faint ecchymosis noted to her left temporal region mildly tender to palpation but it appears to have been there for quite a while.  She is unsure how it came about and denies falling.  She does have some heart murmurs.  Her lungs is notable for bilateral rales and patient also has trace pitting edema extending towards the knee.  No JVD noted  5:32 PM EKG obtained showing left bundle branch block with prominent changes in V1, V3, and V4 and patient is hypertensive with systolic blood pressure of 229.  She does endorse some chest discomfort.  I appreciate consultation from cardiology who did review the EKG and felt that is unchanged from prior and therefore patient does not need direct involvement by cardiology at this time.  She has normal troponin.  We will continue with work-up.  Given elevated blood pressure, I have order 10 mg of hydralazine for better blood pressure control  8:23 PM Labs, EKG, and imaging independently viewed interpreted by me and I agree with radiology interpretation.  Initial EKG shows left bundle branch block and after discussion with cardiology they felt this is similar to prior.  Patient has a minimally elevated troponin of 18.  Will trend troponin.  Electrolyte panels are reassuring, normal WBC, normal H&H.  BNP is mildly elevated at 652.  D-dimer is mildly elevated at 0.64.  Initial chest x-ray obtained showed no acute abnormalities, chest CT angiogram obtained showing no evidence of PE.  Patient does have evidence of  cardiomegaly and coronary disease along with aortic valve calcification.  There are some mild pulmonary fibrosis that can be evaluated further outpatient.  Patient has a  mass to the medial left breast concerning for primary breast malignancy.  This is palpable on my exam.  Patient elevated blood pressure requiring numerous doses of antihypertensive medication.  It is improving and now her current blood pressure is 180/73 after she received 10 mg of hydralazine and 10 mg of labetalol.  Given her presentation, will consult for admission for hypertensive crisis.    8:43 PM Appreciate consultation from [facility] Dr. Posey Pronto who agrees to see and will admit pt for CHF exacerbation and hypertensive crisis  This patient presents to the ED for concern of chest pain, this involves an extensive number of treatment options, and is a complaint that carries with it a high risk of complications and morbidity.  The differential diagnosis includes acs, PE, ptx, pna, dissection, costochondritis, msk pain, shingles  Co morbidities that complicate the patient evaluation PE  HTN Additional history obtained:  Additional history obtained from EMS External records from outside source obtained and reviewed including EMR including prior labs and imaging  Lab Tests:  I Ordered, and personally interpreted labs.  The pertinent results include:  as above  Imaging Studies ordered:  I ordered imaging studies including chest CTA I independently visualized and interpreted imaging which showed no PE I agree with the radiologist interpretation  Cardiac Monitoring:  The patient was maintained on a cardiac monitor.  I personally viewed and interpreted the cardiac monitored which showed an underlying rhythm of: sinus bradycardia  Medicines ordered and prescription drug management:  I ordered medication including labetalol and hydralazine  for HTN Reevaluation of the patient after these medicines showed that the  patient improved I have reviewed the patients home medicines and have made adjustments as needed  Test Considered: as above  Critical Interventions: antihypertensive meds  Consultations Obtained:  I requested consultation with the hospitalist,  and discussed lab and imaging findings as well as pertinent plan - they recommend: admission  Problem List / ED Course: hypertensive crisis  CHF exacerbation  Reevaluation:  After the interventions noted above, I reevaluated the patient and found that they have :improved  Social Determinants of Health: none  Dispostion:  After consideration of the diagnostic results and the patients response to treatment, I feel that the patent would benefit from admission.         Final Clinical Impression(s) / ED Diagnoses Final diagnoses:  Acute on chronic heart failure with preserved ejection fraction (HFpEF) ([place])  Hypertensive crisis    Rx / DC Orders ED Discharge Orders     None         Domenic Moras, PA-C 02/04/22 2045    Elgie Congo, MD 02/05/22 0300

## 2022-02-04 NOTE — Assessment & Plan Note (Signed)
Continue Plavix and atorvastatin.

## 2022-02-04 NOTE — Assessment & Plan Note (Signed)
In sinus rhythm with borderline bradycardia on admission.  Not on anticoagulation.  Continue home amiodarone and Lopressor however consider pulmonary fibrosis changes may be secondary to amiodarone effect.
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
## 2022-02-04 NOTE — Assessment & Plan Note (Signed)
History of right breast cancer s/p mastectomy Known left breast mass seen found on last admission.  Per prior documentation, family wished to avoid further investigation or intervention of the mass.

## 2022-02-04 NOTE — Assessment & Plan Note (Addendum)
EF 60-65% by TTE 11/30/2021.  Presenting with worsening dyspnea and elevated BNP.  Dyspnea likely due to some excessive exacerbation in addition to mild pulmonary fibrosis changes seen on CT imaging.  Mild troponin elevation likely demand ischemia in setting of acute CHF and hypertensive emergency. -Start IV Lasix 40 mg BID -Monitor strict I/O's and daily weights -Continue home Lopressor 25 mg BID -Continue compression stockings

## 2022-02-04 NOTE — ED Notes (Signed)
Patient transported to CT 

## 2022-02-04 NOTE — ED Notes (Signed)
Lab called to add on D-Dimer.

## 2022-02-04 NOTE — ED Notes (Signed)
Clarified with Dr. Georgina Snell that pt can eat. Pt provided [place] sandwich apple sauce and water.

## 2022-02-04 NOTE — Hospital Course (Signed)
[name] is a 88 y.o. female with medical history significant for chronic HFpEF (EF 60-65%), PAF not on anticoagulation, LBBB, PVD, history of CVA, pulmonary fibrosis, HTN, hypothyroidism, HLD, history of rectal cancer, right breast cancer s/p mastectomy, recently found left breast mass concerning for primary breast cancer not seeking treatment, anxiety who is admitted with acute on chronic HFpEF and hypertensive emergency.

## 2022-02-04 NOTE — Assessment & Plan Note (Addendum)
BP as high as 230/97 on arrival.  Has associated mild troponin elevation. -Continue IV Lasix -Resume home amlodipine, hydralazine, Imdur, losartan, Lopressor
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
## 2022-02-05 ENCOUNTER — Observation Stay: Payer: Medicare Other

## 2022-02-05 ENCOUNTER — Observation Stay (HOSPITAL_COMMUNITY): Payer: Medicare Other

## 2022-02-05 DIAGNOSIS — I169 Hypertensive crisis, unspecified: Secondary | ICD-10-CM | POA: Diagnosis present

## 2022-02-05 DIAGNOSIS — I10 Essential (primary) hypertension: Secondary | ICD-10-CM | POA: Diagnosis not present

## 2022-02-05 DIAGNOSIS — R410 Disorientation, unspecified: Secondary | ICD-10-CM | POA: Diagnosis present

## 2022-02-05 DIAGNOSIS — Z961 Presence of intraocular lens: Secondary | ICD-10-CM | POA: Diagnosis present

## 2022-02-05 DIAGNOSIS — F419 Anxiety disorder, unspecified: Secondary | ICD-10-CM | POA: Diagnosis present

## 2022-02-05 DIAGNOSIS — Z79890 Hormone replacement therapy (postmenopausal): Secondary | ICD-10-CM | POA: Diagnosis not present

## 2022-02-05 DIAGNOSIS — K219 Gastro-esophageal reflux disease without esophagitis: Secondary | ICD-10-CM | POA: Diagnosis present

## 2022-02-05 DIAGNOSIS — I161 Hypertensive emergency: Secondary | ICD-10-CM | POA: Diagnosis present

## 2022-02-05 DIAGNOSIS — Z7902 Long term (current) use of antithrombotics/antiplatelets: Secondary | ICD-10-CM | POA: Diagnosis not present

## 2022-02-05 DIAGNOSIS — I2510 Atherosclerotic heart disease of native coronary artery without angina pectoris: Secondary | ICD-10-CM | POA: Diagnosis present

## 2022-02-05 DIAGNOSIS — I110 Hypertensive heart disease with heart failure: Secondary | ICD-10-CM | POA: Diagnosis present

## 2022-02-05 DIAGNOSIS — N6320 Unspecified lump in the left breast, unspecified quadrant: Secondary | ICD-10-CM | POA: Diagnosis present

## 2022-02-05 DIAGNOSIS — I480 Paroxysmal atrial fibrillation: Secondary | ICD-10-CM | POA: Diagnosis present

## 2022-02-05 DIAGNOSIS — E785 Hyperlipidemia, unspecified: Secondary | ICD-10-CM | POA: Diagnosis present

## 2022-02-05 DIAGNOSIS — I872 Venous insufficiency (chronic) (peripheral): Secondary | ICD-10-CM | POA: Diagnosis present

## 2022-02-05 DIAGNOSIS — I5033 Acute on chronic diastolic (congestive) heart failure: Secondary | ICD-10-CM | POA: Diagnosis present

## 2022-02-05 DIAGNOSIS — Z79899 Other long term (current) drug therapy: Secondary | ICD-10-CM | POA: Diagnosis not present

## 2022-02-05 DIAGNOSIS — Z7401 Bed confinement status: Secondary | ICD-10-CM | POA: Diagnosis not present

## 2022-02-05 DIAGNOSIS — I447 Left bundle-branch block, unspecified: Secondary | ICD-10-CM | POA: Diagnosis present

## 2022-02-05 DIAGNOSIS — Z96652 Presence of left artificial knee joint: Secondary | ICD-10-CM | POA: Diagnosis present

## 2022-02-05 DIAGNOSIS — M8580 Other specified disorders of bone density and structure, unspecified site: Secondary | ICD-10-CM | POA: Diagnosis present

## 2022-02-05 DIAGNOSIS — E039 Hypothyroidism, unspecified: Secondary | ICD-10-CM | POA: Diagnosis present

## 2022-02-05 DIAGNOSIS — K589 Irritable bowel syndrome without diarrhea: Secondary | ICD-10-CM | POA: Diagnosis present

## 2022-02-05 DIAGNOSIS — I739 Peripheral vascular disease, unspecified: Secondary | ICD-10-CM | POA: Diagnosis present

## 2022-02-05 DIAGNOSIS — Z66 Do not resuscitate: Secondary | ICD-10-CM | POA: Diagnosis present

## 2022-02-05 DIAGNOSIS — J8410 Pulmonary fibrosis, unspecified: Secondary | ICD-10-CM | POA: Diagnosis present

## 2022-02-05 DIAGNOSIS — R001 Bradycardia, unspecified: Secondary | ICD-10-CM | POA: Diagnosis present

## 2022-02-05 DIAGNOSIS — R519 Headache, unspecified: Secondary | ICD-10-CM | POA: Diagnosis not present

## 2022-02-05 LAB — BASIC METABOLIC PANEL
Anion gap: 9 (ref 5–15)
BUN: 12 mg/dL (ref 8–23)
CO2: 23 mmol/L (ref 22–32)
Calcium: 9.5 mg/dL (ref 8.9–10.3)
Chloride: 103 mmol/L (ref 98–111)
Creatinine, Ser: 0.86 mg/dL (ref 0.44–1.00)
GFR, Estimated: >60 mL/min (ref >60)
Glucose, Bld: 121 mg/dL — ABNORMAL HIGH (ref 70–99)
Potassium: 3.7 mmol/L (ref 3.5–5.1)
Sodium: 135 mmol/L (ref 135–145)

## 2022-02-05 LAB — CBC
HCT: 43.8 % (ref 36.0–46.0)
Hemoglobin: 13.9 g/dL (ref 12.0–15.0)
MCH: 27.1 pg (ref 26.0–34.0)
MCHC: 31.7 g/dL (ref 30.0–36.0)
MCV: 85.4 fL (ref 80.0–100.0)
Platelets: 261 10*3/uL (ref 150–400)
RBC: 5.13 MIL/uL — ABNORMAL HIGH (ref 3.87–5.11)
RDW: 17.2 % — ABNORMAL HIGH (ref 11.5–15.5)
WBC: 9.3 10*3/uL (ref 4.0–10.5)
nRBC: 0 % (ref 0.0–0.2)

## 2022-02-05 LAB — MAGNESIUM: Magnesium: 2.2 mg/dL (ref 1.7–2.4)

## 2022-02-05 MED ORDER — IBUPROFEN 600 MG PO TABS
600.0000 mg | ORAL_TABLET | Freq: Once | ORAL | Status: AC
  Administered 2022-02-05: 600 mg via ORAL
  Filled 2022-02-05: qty 1

## 2022-02-05 MED ORDER — POTASSIUM CHLORIDE CRYS ER 20 MEQ PO TBCR
20.0000 meq | EXTENDED_RELEASE_TABLET | Freq: Every day | ORAL | Status: DC
  Administered 2022-02-05 – 2022-02-07 (×3): 20 meq via ORAL
  Filled 2022-02-05 (×3): qty 1

## 2022-02-05 MED ORDER — FUROSEMIDE 10 MG/ML IJ SOLN
20.0000 mg | Freq: Two times a day (BID) | INTRAMUSCULAR | Status: DC
  Administered 2022-02-05 (×2): 20 mg via INTRAVENOUS
  Filled 2022-02-05 (×2): qty 2

## 2022-02-05 MED ORDER — MORPHINE SULFATE (PF) 2 MG/ML IV SOLN
2.0000 mg | Freq: Once | INTRAVENOUS | Status: AC
  Administered 2022-02-05: 2 mg via INTRAVENOUS
  Filled 2022-02-05: qty 1

## 2022-02-05 NOTE — Progress Notes (Addendum)
Pt with headache, persistent despite tylenol and ibuprofen.  Noted that pt was admitted with hypertensive emergency BP 230/97 initially and mild trop elevation just a couple hours ago.  BP improved on meds.  Pt with confusion but no focal neuro deficits.  Sounds like she may have some confusion at baseline though.  Ordering stat CT head to r/o ICH given HTN emergency + headache.  '2mg'$  morphine x1 for pain control (failed tylenol and ibuprofen thus far).  BP improved a bit too much: 126/60 currently.  I told RN to hold off on any further BP meds for the moment (unless CT head demonstrates ICH).

## 2022-02-05 NOTE — Progress Notes (Signed)
PROGRESS NOTE    [name]  NUU:[id number] DOB: 10/22/1924 DOA: 02/04/2022 PCP: [name], MD  89/F w/ chronic HFpEF (EF 60-65%), PAF not on anticoagulation, LBBB, PVD, history of CVA, pulmonary fibrosis, HTN, hypothyroidism, HLD, history of rectal cancer, right breast cancer s/p mastectomy, recently found left breast mass concerning for primary breast cancer not seeking treatment, anxiety who presented to the ED for evaluation of shortness of breath for 1-2days -In the ED BNP 652, creatinine 1.0, troponin 17, chest x-ray was unremarkable, CT chest noted cardiomegaly, CAD and mild pulmonary fibrosis and lobulated mass of medial left breast  Subjective: -Complains of headache, breathing better  Assessment and Plan:  Acute on chronic heart failure with preserved ejection fraction (HFpEF) ([place]) EF 60-65% by TTE 11/30/2021.  Presenting with worsening dyspnea and elevated BNP.  Dyspnea likely due to some CHF exacerbation in addition to mild pulmonary fibrosis changes as seen on CT  -Clinically improving, she is 2 L negative, continue IV Lasix today will decrease dose -Continue metoprolol and compression stockings  Hypertensive emergency BP as high as 230/97 on arrival. -Likely secondary to volume overload, improving, continue hydralazine Lopressor and Lasix - hold amlodipine losartan -Given severity of headaches,  will discontinue Imdur  Paroxysmal atrial fibrillation (HCC) In sinus rhythm with borderline bradycardia on admission.  Not on anticoagulation.  Continue home amiodarone and Lopressor however consider pulmonary fibrosis changes may be secondary to amiodarone effect. -Check ESR, will decrease amiodarone dose to 200 Mg daily  History of CVA (cerebrovascular accident) Continue Plavix and atorvastatin.  Left breast mass History of right breast cancer s/p mastectomy Known left breast mass seen found on last admission.  Per prior documentation, family wished to avoid further  investigation or intervention of the mass.  Hypothyroidism Continue Synthroid.  will recommend palliative care follow-up at facility  DVT prophylaxis: Heparin subcutaneous Code Status: DNR Family Communication: Discussed patient detail no family at bedside Disposition Plan: Home in 1 to 2 days  Consultants:    Procedures:   Antimicrobials:    Objective: Vitals:   02/05/22 0001 02/05/22 0115 02/05/22 0353 02/05/22 0731  BP: (!) 114/56 126/60 123/66 (!) 150/64  Pulse: 66 65 (!) 56 (!) 59  Resp: _0 Temp: 98.7 F (37.1 C)  98.6 F (37 C) 97.6 F (36.4 C)  TempSrc: Oral  Oral Oral  SpO2: 99% 99% 100% 99%  Weight:   68.2 kg   Height:        Intake/Output Summary (Last 24 hours) at 02/05/2022 1027 Last data filed at 02/05/2022 0900 Gross per 24 hour  Intake 220 ml  Output 2000 ml  Net -1780 ml   Filed Weights   02/04/22 1546 02/04/22 2200 02/05/22 0353  Weight: 79.4 kg 70.5 kg 68.2 kg    Examination:  General exam: Chronically ill elderly female sitting up in bed, hard of hearing, AAO x3, no distress CVS: S1-S2, regular rhythm Lungs: Few basilar rales Abdomen: Soft, nontender, bowel sounds present Extremities: Trace edema  Skin: No rashes Psychiatry:  Mood & affect appropriate.     Data Reviewed:   CBC: Recent Labs  Lab 02/04/22 1546 02/05/22 0222  WBC 7.6 9.3  NEUTROABS 4.4  --   HGB 12.2 13.9  HCT 39.6 43.8  MCV 87.8 85.4  PLT 232 347   Basic Metabolic Panel: Recent Labs  Lab 02/04/22 1546 02/05/22 0222  NA 135 135  K 4.1 3.7  CL 105 103  CO2 23 23  GLUCOSE 77 121*  BUN 10 12  CREATININE 1.03* 0.86  CALCIUM 9.2 9.5  MG  --  2.2   GFR: Estimated Creatinine Clearance: 37.4 mL/min (by C-G formula based on SCr of 0.86 mg/dL). Liver Function Tests: No results for input(s): "AST", "ALT", "ALKPHOS", "BILITOT", "PROT", "ALBUMIN" in the last 168 hours. No results for input(s): "LIPASE", "AMYLASE" in the last 168 hours. No results  for input(s): "AMMONIA" in the last 168 hours. Coagulation Profile: No results for input(s): "INR", "PROTIME" in the last 168 hours. Cardiac Enzymes: No results for input(s): "CKTOTAL", "CKMB", "CKMBINDEX", "TROPONINI" in the last 168 hours. BNP (last 3 results) No results for input(s): "PROBNP" in the last 8760 hours. HbA1C: No results for input(s): "HGBA1C" in the last 72 hours. CBG: Recent Labs  Lab 02/04/22 2205  GLUCAP 104*   Lipid Profile: No results for input(s): "CHOL", "HDL", "LDLCALC", "TRIG", "CHOLHDL", "LDLDIRECT" in the last 72 hours. Thyroid Function Tests: No results for input(s): "TSH", "T4TOTAL", "FREET4", "T3FREE", "THYROIDAB" in the last 72 hours. Anemia Panel: No results for input(s): "VITAMINB12", "FOLATE", "FERRITIN", "TIBC", "IRON", "RETICCTPCT" in the last 72 hours. Urine analysis:    Component Value Date/Time   COLORURINE YELLOW 01/25/2022 1100   APPEARANCEUR CLEAR 01/25/2022 1100   LABSPEC 1.011 01/25/2022 1100   PHURINE 8.0 01/25/2022 1100   GLUCOSEU NEGATIVE 01/25/2022 1100   GLUCOSEU NEGATIVE 05/03/2012 1204   HGBUR NEGATIVE 01/25/2022 1100   BILIRUBINUR NEGATIVE 01/25/2022 1100   KETONESUR NEGATIVE 01/25/2022 1100   PROTEINUR NEGATIVE 01/25/2022 1100   UROBILINOGEN 1.0 01/11/2015 1942   NITRITE NEGATIVE 01/25/2022 1100   LEUKOCYTESUR NEGATIVE 01/25/2022 1100   Sepsis Labs: _0 (procalcitonin:4,lacticidven:4)  )No results found for this or any previous visit (from the past 240 hour(s)).   Radiology Studies: CT HEAD WO CONTRAST (5MM)  Result Date: 02/05/2022 CLINICAL DATA:  Sudden, severe headache EXAM: CT HEAD WITHOUT CONTRAST TECHNIQUE: Contiguous axial images were obtained from the base of the skull through the vertex without intravenous contrast. RADIATION DOSE REDUCTION: This exam was performed according to the departmental dose-optimization program which includes automated exposure control, adjustment of the mA and/or kV according  to patient size and/or use of iterative reconstruction technique. COMPARISON:  01/25/2022 FINDINGS: Brain: No evidence of acute infarction, hemorrhage, hydrocephalus, extra-axial collection or mass lesion/mass effect. Generalized atrophy. Scattered sulcal calcifications which were also seen on prior. Chronic small vessel ischemia in the cerebral white matter. Vascular: High-density vessels from recent enhanced CT. Skull: No acute or aggressive finding Sinuses/Orbits: No acute finding or explanation for headache. IMPRESSION: Senescent changes without acute or interval finding. Electronically Signed   By: Jorje Guild M.D.   On: 02/05/2022 04:18   CT Angio Chest PE W and/or Wo Contrast  Result Date: 02/04/2022 CLINICAL DATA:  PE suspected, sudden shortness of breath, history of breast cancer and rectal cancer * Tracking Code: BO * EXAM: CT ANGIOGRAPHY CHEST WITH CONTRAST TECHNIQUE: Multidetector CT imaging of the chest was performed using the standard protocol during bolus administration of intravenous contrast. Multiplanar CT image reconstructions and MIPs were obtained to evaluate the vascular anatomy. RADIATION DOSE REDUCTION: This exam was performed according to the departmental dose-optimization program which includes automated exposure control, adjustment of the mA and/or kV according to patient size and/or use of iterative reconstruction technique. CONTRAST:  41m OMNIPAQUE IOHEXOL 350 MG/ML SOLN COMPARISON:  01/01/2022 FINDINGS: Cardiovascular: Satisfactory opacification of the pulmonary arteries to the segmental level. No evidence of pulmonary embolism. Cardiomegaly. Extensive three-vessel coronary artery calcifications and/or  stents. No pericardial effusion. Aortic atherosclerosis. Aortic valve calcifications. Mediastinum/Nodes: No enlarged mediastinal, hilar, or axillary lymph nodes. Moderate hiatal hernia. Thyroid gland, trachea, and esophagus demonstrate no significant findings. Lungs/Pleura: Mild  pulmonary fibrosis in a pattern with apical to basal gradient, featuring irregular peripheral interstitial opacity and septal thickening at the lung bases. No pleural effusion or pneumothorax. Upper Abdomen: No acute abnormality. Musculoskeletal: Status post right mastectomy. Redemonstrated, lobulated mass of the medial left breast overlying the left pectoralis major measuring 2.7 x 2.1 cm (series 7, image 138). No acute osseous findings. Review of the MIP images confirms the above findings. IMPRESSION: 1. Negative examination for pulmonary embolism. 2. Cardiomegaly and coronary artery disease. 3. Aortic valve calcifications. Correlate for echocardiographic evidence of aortic valve dysfunction. 4. Mild pulmonary fibrosis in a pattern with apical to basal gradient, featuring irregular peripheral interstitial opacity and septal thickening at the lung bases. This could be further characterized by interstitial lung disease protocol CT on a nonemergent, outpatient basis if clinically appropriate. 5. Redemonstrated, lobulated mass of the medial left breast, concerning for primary breast malignancy. 6. Hiatal hernia. Aortic Atherosclerosis (ICD10-I70.0). Electronically Signed   By: Delanna Ahmadi M.D.   On: 02/04/2022 19:23   DG Chest 2 View  Result Date: 02/04/2022 CLINICAL DATA:  Shortness of breath EXAM: CHEST - 2 VIEW COMPARISON:  01/25/2022 FINDINGS: Cardiac shadow is enlarged. Aortic calcifications are noted. Postsurgical changes are again noted. Lungs are well aerated without focal infiltrate or sizable effusion. No acute bony abnormality is noted. IMPRESSION: No acute abnormality noted. Electronically Signed   By: Inez Catalina M.D.   On: 02/04/2022 16:27     Scheduled Meds:  amiodarone  200 mg Oral Q1200   atorvastatin  40 mg Oral q1800   clopidogrel  75 mg Oral Daily   furosemide  20 mg Intravenous BID   heparin  5,000 Units Subcutaneous Q8H   hydrALAZINE  10 mg Oral BID   levothyroxine  75 mcg Oral  Q0600   metoprolol tartrate  25 mg Oral BID   pantoprazole  40 mg Oral Daily   [START ON 02/07/2022] potassium chloride SA  20 mEq Oral Q M,W,F   sodium chloride flush  3 mL Intravenous Q12H   Continuous Infusions:   LOS: 0 days    Time spent: 91mn  PDomenic Polite MD [facility]   02/05/2022, 10:27 AM

## 2022-02-05 NOTE — Progress Notes (Signed)
CSW acknowledges consult for SNF/HH. The patient will require PT/OT evaluations. TOC will assist with disposition planning once the evaluations have been completed.  °  °TOC will continue to follow.    °

## 2022-02-05 NOTE — Evaluation (Signed)
Physical Therapy Evaluation Patient Details Name: [name] MRN: [id number] DOB: 10-23-1924 Today's Date: 02/05/2022  History of Present Illness  The pt is a 88 yo female presenting 8/4 from [place] with SOB. Pt found to have acute CHF exacerbation and HTN emergency. PMH includes: hypothyroidism, anxiety, HTN, venous insufficiency, and GERD.   Clinical Impression  Pt in bed upon arrival of PT, agreeable to evaluation at this time. The pt is a questionable historian and no family was present to confirm information given, but the pt reports that prior to admission she was independent with mobility using a walker, and independent with ADLs and IADLs. She states meals are brought to her, and that she has not had rehab at [facility] recently. The pt now presents with limitations in functional mobility, strength, stability, endurance, and safety awareness due to above dx, and will continue to benefit from skilled PT to address these deficits. She did not require increased assist for bed mobility, but currently benefits from minG and use of RW for safety with OOB transfers and ambulation. VSS with all activity.   The pt will likely make good progress and be able to return to [facility], however it is unclear if the pt is from independent or assisted living as notes from this admission and the pt state independent living, but notes from last admission on 7/25 state assisted living. If the pt is from independent living, would benefit from increased supervision in the short term, and possibly an aide in addition to [place].      Recommendations for follow up therapy are one component of a multi-disciplinary discharge planning process, led by the attending physician.  Recommendations may be updated based on patient status, additional functional criteria and insurance authorization.  Follow Up Recommendations Home health PT      Assistance Recommended at Discharge Frequent or constant  Supervision/Assistance  Patient can return home with the following  A little help with walking and/or transfers;A little help with bathing/dressing/bathroom;Assistance with cooking/housework;Direct supervision/assist for medications management;Direct supervision/assist for financial management;Help with stairs or ramp for entrance    Equipment Recommendations None recommended by PT  Recommendations for Other Services  OT consult    Functional Status Assessment Patient has had a recent decline in their functional status and demonstrates the ability to make significant improvements in function in a reasonable and predictable amount of time.     Precautions / Restrictions Precautions Precautions: Fall Precaution Comments: HOH Restrictions Weight Bearing Restrictions: No      Mobility  Bed Mobility Overal bed mobility: Modified Independent             General bed mobility comments: increased time    Transfers Overall transfer level: Needs assistance Equipment used: Rolling walker (2 wheels) Transfers: Sit to/from Stand Sit to Stand: Min guard           General transfer comment: minG for safety, slow to rise    Ambulation/Gait Ambulation/Gait assistance: Min guard Gait Distance (Feet): 30 Feet Assistive device: Rolling walker (2 wheels) Gait Pattern/deviations: Step-through pattern, Decreased stride length Gait velocity: decreased Gait velocity interpretation: <1.31 ft/sec, indicative of household ambulator   General Gait Details: slow but steady, pt reports stiffness in BLE     Balance Overall balance assessment: Mild deficits observed, not formally tested  Pertinent Vitals/Pain Pain Assessment Pain Assessment: Faces Faces Pain Scale: Hurts little more Pain Location: bilateral legs Pain Descriptors / Indicators: Discomfort Pain Intervention(s): Limited activity within patient's tolerance,  Monitored during session, Repositioned    Home Living Family/patient expects to be discharged to:: Other (Comment) (independent living) Living Arrangements: Alone Available Help at Discharge: Available 24 hours/day Type of Home: Independent living facility Home Access: Level entry       Home Layout: One level Home Equipment: Rollator (4 wheels);Wheelchair - Sport and exercise psychologist Comments: Lives at [facility] on third floor with elevator.    Prior Function Prior Level of Function : Independent/Modified Independent             Mobility Comments: Uses rollator without physical assistance. ADLs Comments: Pt independent with bathing, dressing, feeding, and managing medications. Does not cook or drive.     Hand Dominance   Dominant Hand: Right    Extremity/Trunk Assessment   Upper Extremity Assessment Upper Extremity Assessment: Generalized weakness    Lower Extremity Assessment Lower Extremity Assessment: Generalized weakness    Cervical / Trunk Assessment Cervical / Trunk Assessment: Kyphotic  Communication   Communication: HOH (has hearing aides)  Cognition Arousal/Alertness: Awake/alert Behavior During Therapy: WFL for tasks assessed/performed Overall Cognitive Status: Difficult to assess                                 General Comments: difficult to fully assess due to [facility], pt needed slightly increased time to process, but answered questions appropriately. not formally assessed. Not oriendted to situation, states she is at hospital due to fall (that was last admission on 7/25)        General Comments General comments (skin integrity, edema, etc.): VSS on RA through session    Exercises     Assessment/Plan    PT Assessment Patient needs continued PT services  PT Problem List Decreased strength       PT Treatment Interventions DME instruction;Gait training;Stair training;Functional mobility training;Therapeutic  activities;Therapeutic exercise;Balance training;Patient/family education    PT Goals (Current goals can be found in the Care Plan section)  Acute Rehab PT Goals Patient Stated Goal: return home PT Goal Formulation: With patient Time For Goal Achievement: 02/19/22 Potential to Achieve Goals: Good    Frequency Min 3X/week    AM-PAC PT "6 Clicks" Mobility  Outcome Measure Help needed turning from your back to your side while in a flat bed without using bedrails?: None Help needed moving from lying on your back to sitting on the side of a flat bed without using bedrails?: None Help needed moving to and from a bed to a chair (including a wheelchair)?: A Little Help needed standing up from a chair using your arms (e.g., wheelchair or bedside chair)?: A Little Help needed to walk in hospital room?: A Little Help needed climbing 3-5 steps with a railing? : A Little 6 Click Score: 20    End of Session Equipment Utilized During Treatment: Gait belt Activity Tolerance: Patient tolerated treatment well Patient left: in chair;with call bell/phone within reach;with chair alarm set Nurse Communication: Mobility status PT Visit Diagnosis: Unsteadiness on feet (R26.81);Other abnormalities of gait and mobility (R26.89)    Time: 4034-7425 PT Time Calculation (min) (ACUTE ONLY): 23 min   Charges:   PT Evaluation $PT Eval Low Complexity: 1 Low PT Treatments $Therapeutic Activity: 8-22 mins        [place] Carbo, PT, DPT  Acute Rehabilitation Department  Sandra Cockayne 02/05/2022, 1:52 PM

## 2022-02-06 ENCOUNTER — Encounter (HOSPITAL_COMMUNITY): Payer: Self-pay | Admitting: Internal Medicine

## 2022-02-06 DIAGNOSIS — I5033 Acute on chronic diastolic (congestive) heart failure: Secondary | ICD-10-CM | POA: Diagnosis not present

## 2022-02-06 LAB — BASIC METABOLIC PANEL
Anion gap: 9 (ref 5–15)
BUN: 13 mg/dL (ref 8–23)
CO2: 23 mmol/L (ref 22–32)
Calcium: 9.4 mg/dL (ref 8.9–10.3)
Chloride: 102 mmol/L (ref 98–111)
Creatinine, Ser: 1.07 mg/dL — ABNORMAL HIGH (ref 0.44–1.00)
GFR, Estimated: 48 mL/min — ABNORMAL LOW (ref >60)
Glucose, Bld: 123 mg/dL — ABNORMAL HIGH (ref 70–99)
Potassium: 4.1 mmol/L (ref 3.5–5.1)
Sodium: 134 mmol/L — ABNORMAL LOW (ref 135–145)

## 2022-02-06 MED ORDER — MUSCLE RUB 10-15 % EX CREA
TOPICAL_CREAM | CUTANEOUS | Status: DC | PRN
  Filled 2022-02-06: qty 85

## 2022-02-06 MED ORDER — IBUPROFEN 600 MG PO TABS
600.0000 mg | ORAL_TABLET | Freq: Once | ORAL | Status: AC
Start: 2022-02-06 — End: 2022-02-06
  Administered 2022-02-06: 600 mg via ORAL
  Filled 2022-02-06: qty 1

## 2022-02-06 MED ORDER — FUROSEMIDE 40 MG PO TABS
40.0000 mg | ORAL_TABLET | Freq: Every day | ORAL | Status: DC
  Administered 2022-02-06: 40 mg via ORAL
  Filled 2022-02-06: qty 1

## 2022-02-06 NOTE — Progress Notes (Signed)
PROGRESS NOTE    [name] LASETER  UUV:[id number] DOB: Oct 31, 1924 DOA: 02/04/2022 PCP: [name], MD  89/F w/ chronic HFpEF (EF 60-65%), PAF not on anticoagulation, LBBB, PVD, history of CVA, pulmonary fibrosis, HTN, hypothyroidism, HLD, history of rectal cancer, right breast cancer s/p mastectomy, recently found left breast mass concerning for primary breast cancer not seeking treatment, anxiety who presented to the ED for evaluation of shortness of breath for 1-2days -In the ED BNP 652, creatinine 1.0, troponin 17, chest x-ray was unremarkable, CT chest noted cardiomegaly, CAD and mild pulmonary fibrosis and lobulated mass of medial left breast  Subjective: -No headache this morning, breathing is better  Assessment and Plan:  Acute on chronic heart failure with preserved ejection fraction (HFpEF) ([place]) EF 60-65% by TTE 11/30/2021.  Presenting with worsening dyspnea and elevated BNP.  Dyspnea likely due to some CHF exacerbation in addition to mild pulmonary fibrosis changes as seen on CT  -Clinically improving, she is 3.4 L negative, appears close to euvolemic will transition to oral Lasix today  -Continue metoprolol and compression stockings -Discharge planning, home tomorrow if stable  Hypertensive emergency BP as high as 230/97 on arrival. -Likely secondary to volume overload, improving, continue hydralazine Lopressor and Lasix - hold amlodipine losartan -Given severity of headaches, I discontinued Imdur  Paroxysmal atrial fibrillation (HCC) In sinus rhythm with borderline bradycardia on admission.  Not on anticoagulation.  Continue home amiodarone and Lopressor however consider pulmonary fibrosis changes may be secondary to amiodarone effect. -Decreased amiodarone dose to 200 Mg daily  History of CVA (cerebrovascular accident) Continue Plavix and atorvastatin.  Left breast mass History of right breast cancer s/p mastectomy Known left breast mass seen found on last admission.   Per prior documentation, family wished to avoid further investigation or intervention of the mass.  Hypothyroidism Continue Synthroid.  will recommend palliative care follow-up at facility  DVT prophylaxis: Heparin subcutaneous Code Status: DNR Family Communication: Discussed patient detail no family at bedside Disposition Plan: Home tomorrow if stable  Consultants:    Procedures:   Antimicrobials:    Objective: Vitals:   02/06/22 0400 02/06/22 0602 02/06/22 0603 02/06/22 0750  BP: (!) 143/75 (!) 157/76  (!) 132/97  Pulse:  (!) 57    Resp: '17 19  16  '$ Temp:  97.6 F (36.4 C)    TempSrc:  Oral    SpO2:  100%  95%  Weight:   69.6 kg   Height:        Intake/Output Summary (Last 24 hours) at 02/06/2022 0936 Last data filed at 02/06/2022 0500 Gross per 24 hour  Intake 120 ml  Output 1800 ml  Net -1680 ml   Filed Weights   02/04/22 2200 02/05/22 0353 02/06/22 0603  Weight: 70.5 kg 68.2 kg 69.6 kg    Examination:  General exam: Chronically ill elderly female sitting up in bed, hard of hearing, AAOx3, no distress CVS: S1-S2, regular rhythm Lungs: Decreased breath sounds to bases Abdomen: Soft, nontender, bowel sounds present Extremities: No edema  Skin: No rashes Psychiatry:  Mood & affect appropriate.     Data Reviewed:   CBC: Recent Labs  Lab 02/04/22 1546 02/05/22 0222  WBC 7.6 9.3  NEUTROABS 4.4  --   HGB 12.2 13.9  HCT 39.6 43.8  MCV 87.8 85.4  PLT 232 474   Basic Metabolic Panel: Recent Labs  Lab 02/04/22 1546 02/05/22 0222 02/06/22 0401  NA 135 135 134*  K 4.1 3.7 4.1  CL 105 103  102  CO2 '23 23 23  '$ GLUCOSE 77 121* 123*  BUN '10 12 13  '$ CREATININE 1.03* 0.86 1.07*  CALCIUM 9.2 9.5 9.4  MG  --  2.2  --    GFR: Estimated Creatinine Clearance: 30.1 mL/min (A) (by C-G formula based on SCr of 1.07 mg/dL (H)). Liver Function Tests: No results for input(s): "AST", "ALT", "ALKPHOS", "BILITOT", "PROT", "ALBUMIN" in the last 168 hours. No  results for input(s): "LIPASE", "AMYLASE" in the last 168 hours. No results for input(s): "AMMONIA" in the last 168 hours. Coagulation Profile: No results for input(s): "INR", "PROTIME" in the last 168 hours. Cardiac Enzymes: No results for input(s): "CKTOTAL", "CKMB", "CKMBINDEX", "TROPONINI" in the last 168 hours. BNP (last 3 results) No results for input(s): "PROBNP" in the last 8760 hours. HbA1C: No results for input(s): "HGBA1C" in the last 72 hours. CBG: Recent Labs  Lab 02/04/22 2205  GLUCAP 104*   Lipid Profile: No results for input(s): "CHOL", "HDL", "LDLCALC", "TRIG", "CHOLHDL", "LDLDIRECT" in the last 72 hours. Thyroid Function Tests: No results for input(s): "TSH", "T4TOTAL", "FREET4", "T3FREE", "THYROIDAB" in the last 72 hours. Anemia Panel: No results for input(s): "VITAMINB12", "FOLATE", "FERRITIN", "TIBC", "IRON", "RETICCTPCT" in the last 72 hours. Urine analysis:    Component Value Date/Time   COLORURINE YELLOW 01/25/2022 1100   APPEARANCEUR CLEAR 01/25/2022 1100   LABSPEC 1.011 01/25/2022 1100   PHURINE 8.0 01/25/2022 1100   GLUCOSEU NEGATIVE 01/25/2022 1100   GLUCOSEU NEGATIVE 05/03/2012 1204   HGBUR NEGATIVE 01/25/2022 1100   BILIRUBINUR NEGATIVE 01/25/2022 1100   KETONESUR NEGATIVE 01/25/2022 1100   PROTEINUR NEGATIVE 01/25/2022 1100   UROBILINOGEN 1.0 01/11/2015 1942   NITRITE NEGATIVE 01/25/2022 1100   LEUKOCYTESUR NEGATIVE 01/25/2022 1100   Sepsis Labs: '@LABRCNTIP'$ (procalcitonin:4,lacticidven:4)  )No results found for this or any previous visit (from the past 240 hour(s)).   Radiology Studies: CT HEAD WO CONTRAST (5MM)  Result Date: 02/05/2022 CLINICAL DATA:  Sudden, severe headache EXAM: CT HEAD WITHOUT CONTRAST TECHNIQUE: Contiguous axial images were obtained from the base of the skull through the vertex without intravenous contrast. RADIATION DOSE REDUCTION: This exam was performed according to the departmental dose-optimization program which  includes automated exposure control, adjustment of the mA and/or kV according to patient size and/or use of iterative reconstruction technique. COMPARISON:  01/25/2022 FINDINGS: Brain: No evidence of acute infarction, hemorrhage, hydrocephalus, extra-axial collection or mass lesion/mass effect. Generalized atrophy. Scattered sulcal calcifications which were also seen on prior. Chronic small vessel ischemia in the cerebral white matter. Vascular: High-density vessels from recent enhanced CT. Skull: No acute or aggressive finding Sinuses/Orbits: No acute finding or explanation for headache. IMPRESSION: Senescent changes without acute or interval finding. Electronically Signed   By: Jorje Guild M.D.   On: 02/05/2022 04:18   CT Angio Chest PE W and/or Wo Contrast  Result Date: 02/04/2022 CLINICAL DATA:  PE suspected, sudden shortness of breath, history of breast cancer and rectal cancer * Tracking Code: BO * EXAM: CT ANGIOGRAPHY CHEST WITH CONTRAST TECHNIQUE: Multidetector CT imaging of the chest was performed using the standard protocol during bolus administration of intravenous contrast. Multiplanar CT image reconstructions and MIPs were obtained to evaluate the vascular anatomy. RADIATION DOSE REDUCTION: This exam was performed according to the departmental dose-optimization program which includes automated exposure control, adjustment of the mA and/or kV according to patient size and/or use of iterative reconstruction technique. CONTRAST:  14m OMNIPAQUE IOHEXOL 350 MG/ML SOLN COMPARISON:  01/01/2022 FINDINGS: Cardiovascular: Satisfactory opacification of the pulmonary arteries  to the segmental level. No evidence of pulmonary embolism. Cardiomegaly. Extensive three-vessel coronary artery calcifications and/or stents. No pericardial effusion. Aortic atherosclerosis. Aortic valve calcifications. Mediastinum/Nodes: No enlarged mediastinal, hilar, or axillary lymph nodes. Moderate hiatal hernia. Thyroid gland,  trachea, and esophagus demonstrate no significant findings. Lungs/Pleura: Mild pulmonary fibrosis in a pattern with apical to basal gradient, featuring irregular peripheral interstitial opacity and septal thickening at the lung bases. No pleural effusion or pneumothorax. Upper Abdomen: No acute abnormality. Musculoskeletal: Status post right mastectomy. Redemonstrated, lobulated mass of the medial left breast overlying the left pectoralis major measuring 2.7 x 2.1 cm (series 7, image 138). No acute osseous findings. Review of the MIP images confirms the above findings. IMPRESSION: 1. Negative examination for pulmonary embolism. 2. Cardiomegaly and coronary artery disease. 3. Aortic valve calcifications. Correlate for echocardiographic evidence of aortic valve dysfunction. 4. Mild pulmonary fibrosis in a pattern with apical to basal gradient, featuring irregular peripheral interstitial opacity and septal thickening at the lung bases. This could be further characterized by interstitial lung disease protocol CT on a nonemergent, outpatient basis if clinically appropriate. 5. Redemonstrated, lobulated mass of the medial left breast, concerning for primary breast malignancy. 6. Hiatal hernia. Aortic Atherosclerosis (ICD10-I70.0). Electronically Signed   By: Delanna Ahmadi M.D.   On: 02/04/2022 19:23   DG Chest 2 View  Result Date: 02/04/2022 CLINICAL DATA:  Shortness of breath EXAM: CHEST - 2 VIEW COMPARISON:  01/25/2022 FINDINGS: Cardiac shadow is enlarged. Aortic calcifications are noted. Postsurgical changes are again noted. Lungs are well aerated without focal infiltrate or sizable effusion. No acute bony abnormality is noted. IMPRESSION: No acute abnormality noted. Electronically Signed   By: Inez Catalina M.D.   On: 02/04/2022 16:27     Scheduled Meds:  amiodarone  200 mg Oral Q1200   atorvastatin  40 mg Oral q1800   clopidogrel  75 mg Oral Daily   furosemide  40 mg Oral Daily   heparin  5,000 Units  Subcutaneous Q8H   hydrALAZINE  10 mg Oral BID   levothyroxine  75 mcg Oral Q0600   metoprolol tartrate  25 mg Oral BID   pantoprazole  40 mg Oral Daily   potassium chloride SA  20 mEq Oral Daily   sodium chloride flush  3 mL Intravenous Q12H   Continuous Infusions:   LOS: 1 day    Time spent: 7mn  PDomenic Polite MD [facility]   02/06/2022, 9:36 AM

## 2022-02-06 NOTE — Plan of Care (Signed)

## 2022-02-06 NOTE — Evaluation (Signed)
Occupational Therapy Evaluation Patient Details Name: [name] MRN: [id number] DOB: 10-08-1924 Today's Date: 02/06/2022   History of Present Illness The pt is a 88 yo female presenting 8/4 from [place] with SOB. Pt found to have acute CHF exacerbation and HTN emergency. PMH includes: hypothyroidism, anxiety, HTN, venous insufficiency, and GERD.   Clinical Impression   Patient admitted for the diagnosis above.  PTA she lives in a 3rd floor apartment at [facility].  Typically she tries to walk to the common dinning room, and is able to care for her own ADL and medications.  Today she is not feeling well, she describes having a headache and just not feeling well.  RN is aware, and patient declined up to the recliner after the eval.  OT will continue efforts in the acute setting, and HH OT can be considered depending on progress.        Recommendations for follow up therapy are one component of a multi-disciplinary discharge planning process, led by the attending physician.  Recommendations may be updated based on patient status, additional functional criteria and insurance authorization.   Follow Up Recommendations  Follow physician's recommendations for discharge plan and follow up therapies    Assistance Recommended at Discharge Intermittent Supervision/Assistance  Patient can return home with the following A little help with walking and/or transfers    Functional Status Assessment  Patient has had a recent decline in their functional status and demonstrates the ability to make significant improvements in function in a reasonable and predictable amount of time.  Equipment Recommendations  None recommended by OT    Recommendations for Other Services       Precautions / Restrictions Precautions Precautions: Fall Precaution Comments: HOH Restrictions Weight Bearing Restrictions: No      Mobility Bed Mobility Overal bed mobility: Needs Assistance              General bed mobility comments: increased time and use of SR's    Transfers Overall transfer level: Needs assistance Equipment used: Rolling walker (2 wheels) Transfers: Sit to/from Stand Sit to Stand: Min guard, Min assist                  Balance Overall balance assessment: Mild deficits observed, not formally tested                                         ADL either performed or assessed with clinical judgement   ADL       Grooming: Wash/dry hands;Wash/dry face;Set up;Sitting               Lower Body Dressing: Minimal assistance;Sit to/from stand   Toilet Transfer: Min guard;Rolling walker (2 wheels);Regular Toilet                   Vision Baseline Vision/History: 1 Wears glasses Patient Visual Report: No change from baseline       Perception Perception Perception: Within Functional Limits   Praxis Praxis Praxis: Intact    Pertinent Vitals/Pain Pain Assessment Pain Assessment: Faces Faces Pain Scale: Hurts little more Pain Location: headache Pain Descriptors / Indicators: Discomfort, Aching Pain Intervention(s): Monitored during session     Hand Dominance Right   Extremity/Trunk Assessment Upper Extremity Assessment Upper Extremity Assessment: Generalized weakness   Lower Extremity Assessment Lower Extremity Assessment: Defer to PT evaluation   Cervical / Trunk Assessment Cervical /  Trunk Assessment: Kyphotic   Communication Communication Communication: HOH   Cognition Arousal/Alertness: Awake/alert Behavior During Therapy: WFL for tasks assessed/performed Overall Cognitive Status: No family/caregiver present to determine baseline cognitive functioning Area of Impairment: Memory, Following commands, Problem solving, Safety/judgement                     Memory: Decreased short-term memory Following Commands: Follows one step commands consistently, Follows multi-step commands with increased time      Problem Solving: Slow processing       General Comments   HR in the upper 50's    Exercises     Shoulder Instructions      Home Living Family/patient expects to be discharged to:: Private residence Living Arrangements: Alone Available Help at Discharge: Available 24 hours/day Type of Home: Independent living facility Home Access: Elevator     Home Layout: One level     Bathroom Shower/Tub: Occupational psychologist: Handicapped height Bathroom Accessibility: Yes How Accessible: Accessible via walker Home Equipment: Rollator (4 wheels);Wheelchair - manual;Shower seat          Prior Functioning/Environment               Mobility Comments: Uses rollator without physical assistance. ADLs Comments: Pt independent with bathing, dressing, feeding, and managing medications. Does not cook or drive.        OT Problem List: Decreased activity tolerance;Impaired balance (sitting and/or standing);Decreased strength;Pain      OT Treatment/Interventions: Self-care/ADL training;Therapeutic activities;DME and/or AE instruction;Balance training;Patient/family education    OT Goals(Current goals can be found in the care plan section) Acute Rehab OT Goals Patient Stated Goal: To feel better OT Goal Formulation: With patient Time For Goal Achievement: 02/21/22 Potential to Achieve Goals: Good  OT Frequency: Min 2X/week    Co-evaluation              AM-PAC OT "6 Clicks" Daily Activity     Outcome Measure Help from another person eating meals?: None Help from another person taking care of personal grooming?: None Help from another person toileting, which includes using toliet, bedpan, or urinal?: A Little Help from another person bathing (including washing, rinsing, drying)?: A Little Help from another person to put on and taking off regular upper body clothing?: None Help from another person to put on and taking off regular lower body clothing?: A Little 6  Click Score: 21   End of Session Equipment Utilized During Treatment: Gait belt;Rolling walker (2 wheels) Nurse Communication: Mobility status  Activity Tolerance: Patient limited by pain Patient left: in bed;with call bell/phone within reach;with bed alarm set  OT Visit Diagnosis: Unsteadiness on feet (R26.81)                Time: 1250-1310 OT Time Calculation (min): 20 min Charges:  OT General Charges $OT Visit: 1 Visit OT Evaluation $OT Eval Moderate Complexity: 1 Mod  02/06/2022  RP, OTR/L  Acute Rehabilitation Services  Office:  [phone]   Metta Clines 02/06/2022, 1:32 PM

## 2022-02-07 DIAGNOSIS — I5033 Acute on chronic diastolic (congestive) heart failure: Secondary | ICD-10-CM | POA: Diagnosis not present

## 2022-02-07 LAB — BASIC METABOLIC PANEL
Anion gap: 9 (ref 5–15)
BUN: 16 mg/dL (ref 8–23)
CO2: 21 mmol/L — ABNORMAL LOW (ref 22–32)
Calcium: 9.4 mg/dL (ref 8.9–10.3)
Chloride: 102 mmol/L (ref 98–111)
Creatinine, Ser: 1.2 mg/dL — ABNORMAL HIGH (ref 0.44–1.00)
GFR, Estimated: 42 mL/min — ABNORMAL LOW (ref >60)
Glucose, Bld: 114 mg/dL — ABNORMAL HIGH (ref 70–99)
Potassium: 4.3 mmol/L (ref 3.5–5.1)
Sodium: 132 mmol/L — ABNORMAL LOW (ref 135–145)

## 2022-02-07 LAB — GLUCOSE, CAPILLARY: Glucose-Capillary: 142 mg/dL — ABNORMAL HIGH (ref 70–99)

## 2022-02-07 MED ORDER — FUROSEMIDE 20 MG PO TABS: 20.0000 mg | ORAL_TABLET | Freq: Every day | ORAL | Status: DC

## 2022-02-07 MED ORDER — FUROSEMIDE 40 MG PO TABS: 20.0000 mg | ORAL_TABLET | Freq: Every day | ORAL | Status: DC

## 2022-02-07 NOTE — Discharge Summary (Addendum)
Physician Discharge Summary  [name] GXQ:[id number] DOB: [birth year] DOA: 02/04/2022  PCP: [name], MD  Admit date: 02/04/2022 Discharge date: 02/07/2022  Time spent: 35  minutes  Recommendations for Outpatient Follow-up:  PCP in 1 week, monitor BP at FU Recommend Palliative care eval at [facility] PT/RN  Discharge Diagnoses:  Principal Problem:   Acute on chronic heart failure with preserved ejection fraction (HFpEF) (HCC) Active Problems:   Hypertensive emergency   Paroxysmal atrial fibrillation (HCC)   Hypothyroidism   Left breast mass   History of CVA (cerebrovascular accident)   H/o R breast cancer   DO NOT RESUSCITATE  Discharge Condition: stable  Diet recommendation: low sodium heart healthy  Filed Weights   02/06/22 0603 02/06/22 1400 02/07/22 0400  Weight: 69.6 kg 69.3 kg 68.9 kg    History of present illness:  88/F w/ chronic HFpEF (EF 60-65%), PAF not on anticoagulation, LBBB, PVD, history of CVA, pulmonary fibrosis, HTN, hypothyroidism, HLD, history of rectal cancer, right breast cancer s/p mastectomy, recently found left breast mass concerning for primary breast cancer not seeking treatment, anxiety who presented to the ED for evaluation of shortness of breath for 1-2days -In the ED BNP 652, creatinine 1.0, troponin 17, chest x-ray was unremarkable, CT chest noted cardiomegaly, CAD and mild pulmonary fibrosis and lobulated mass of medial left breast    Hospital Course:   Acute on chronic heart failure with preserved ejection fraction (HFpEF) ([place]) EF 60-65% by TTE 11/30/2021.  Presenting with worsening dyspnea and elevated BNP.  Dyspnea likely due to some CHF exacerbation in addition to mild pulmonary fibrosis changes as seen on CT  -Clinically improving, she is 4 L negative, now euvolemic , transition to oral Lasix today  -Continue metoprolol and compression stockings -Discharged back to [facility]   Hypertensive  emergency BP as high as 230/97 on arrival. -Likely secondary to volume overload, improving, continue hydralazine Lopressor and Lasix - stopped amlodipine losartan -Given severity of headaches, I discontinued Imdur   Paroxysmal atrial fibrillation (HCC) In sinus rhythm with borderline bradycardia on admission.  Not on anticoagulation.  Continue home amiodarone and Lopressor however consider pulmonary fibrosis changes may be secondary to amiodarone effect. -Decreased amiodarone dose to 200 Mg daily   History of CVA (cerebrovascular accident) Continue Plavix and atorvastatin.   Left breast mass History of right breast cancer s/p mastectomy Known left breast mass seen found on last admission.  Per prior documentation, family wished to avoid further investigation or intervention of the mass.   Hypothyroidism Continue Synthroid.   will recommend palliative care follow-up at facility  Code Status: DNR  Discharge Exam: Vitals:   02/07/22 86 02/07/22 1251  BP: 128/77 127/74  Pulse: 71 65  Resp: (!) 21   Temp: 98.1 F (36.7 C)   SpO2: 93%    General exam: Chronically ill elderly female sitting up in bed, hard of hearing, 86Ox3, no distress CVS: S1-S2, regular rhythm Lungs: Decreased breath sounds to bases Abdomen: Soft, nontender, bowel sounds present Extremities: No edema  Skin: No rashes Psychiatry:  Mood & affect appropriate.   Discharge Instructions   Discharge Instructions     Diet - low sodium heart healthy   Complete by: As directed    Increase activity slowly   Complete by: As directed       Allergies as of 02/07/2022       Reactions   Codeine Itching, Anxiety, Other (See Comments)   Caused the patient  to feel nervous/confused.   Pregabalin Anxiety, Other (See Comments)   Caused nervousness        Medication List     STOP taking these medications    amLODipine 5 MG tablet Commonly known as: NORVASC   isosorbide mononitrate 30 MG 24 hr  tablet Commonly known as: IMDUR   losartan 25 MG tablet Commonly known as: COZAAR       TAKE these medications    acetaminophen 500 MG tablet Commonly known as: TYLENOL Take 1,000 mg by mouth every 6 (six) hours as needed for headache (pain).   amiodarone 200 MG tablet Commonly known as: PACERONE Take 200 mg by mouth daily at 12 noon.   atorvastatin 40 MG tablet Commonly known as: LIPITOR Take 1 tablet (40 mg total) by mouth daily at 6 PM. What changed: when to take this   clopidogrel 75 MG tablet Commonly known as: PLAVIX Take 1 tablet (75 mg total) by mouth daily. What changed: when to take this   diclofenac sodium 1 % Gel Commonly known as: VOLTAREN APPLY 2-4 GRAMS TO AFFECTED AREA 2-3 TIMES A DAY AS NEEDED. What changed: See the new instructions.   esomeprazole 40 MG capsule Commonly known as: NEXIUM Take 40 mg by mouth every morning.   furosemide 40 MG tablet Commonly known as: LASIX Take 0.5 tablets (20 mg total) by mouth daily. What changed: when to take this   hydrALAZINE 10 MG tablet Commonly known as: APRESOLINE Take 1 tablet (10 mg total) by mouth 2 (two) times daily.   levothyroxine 75 MCG tablet Commonly known as: SYNTHROID Take 1 tablet (75 mcg total) by mouth daily. What changed: when to take this   melatonin 3 MG Tabs tablet Take 3 mg by mouth at bedtime as needed (sleep).   metoprolol tartrate 25 MG tablet Commonly known as: LOPRESSOR Take 25 mg by mouth See admin instructions. Take one tablet (25 mg) by mouth twice daily - noon and evening   nitroGLYCERIN 0.4 MG SL tablet Commonly known as: NITROSTAT Place 1 tablet (0.4 mg total) under the tongue every 5 (five) minutes x 3 doses as needed for chest pain.   potassium chloride SA 20 MEQ tablet Commonly known as: Klor-Con M20 Take 1 tablet (20 mEq total) by mouth every Monday, Wednesday, and Friday. What changed: when to take this   Systane 0.4-0.3 % Gel ophthalmic gel Generic drug:  Polyethyl Glycol-Propyl Glycol Place 1 application into both eyes at bedtime.       Allergies  Allergen Reactions   Codeine Itching, Anxiety and Other (See Comments)    Caused the patient to feel nervous/confused.   Pregabalin Anxiety and Other (See Comments)    Caused nervousness      The results of significant diagnostics from this hospitalization (including imaging, microbiology, ancillary and laboratory) are listed below for reference.    Significant Diagnostic Studies: CT HEAD WO CONTRAST (5MM)  Result Date: 02/05/2022 CLINICAL DATA:  Sudden, severe headache EXAM: CT HEAD WITHOUT CONTRAST TECHNIQUE: Contiguous axial images were obtained from the base of the skull through the vertex without intravenous contrast. RADIATION DOSE REDUCTION: This exam was performed according to the departmental dose-optimization program which includes automated exposure control, adjustment of the mA and/or kV according to patient size and/or use of iterative reconstruction technique. COMPARISON:  01/25/2022 FINDINGS: Brain: No evidence of acute infarction, hemorrhage, hydrocephalus, extra-axial collection or mass lesion/mass effect. Generalized atrophy. Scattered sulcal calcifications which were also seen on prior. Chronic small vessel  ischemia in the cerebral white matter. Vascular: High-density vessels from recent enhanced CT. Skull: No acute or aggressive finding Sinuses/Orbits: No acute finding or explanation for headache. IMPRESSION: Senescent changes without acute or interval finding. Electronically Signed   By: Jorje Guild M.D.   On: 02/05/2022 04:18   CT Angio Chest PE W and/or Wo Contrast  Result Date: 02/04/2022 CLINICAL DATA:  PE suspected, sudden shortness of breath, history of breast cancer and rectal cancer * Tracking Code: BO * EXAM: CT ANGIOGRAPHY CHEST WITH CONTRAST TECHNIQUE: Multidetector CT imaging of the chest was performed using the standard protocol during bolus administration of  intravenous contrast. Multiplanar CT image reconstructions and MIPs were obtained to evaluate the vascular anatomy. RADIATION DOSE REDUCTION: This exam was performed according to the departmental dose-optimization program which includes automated exposure control, adjustment of the mA and/or kV according to patient size and/or use of iterative reconstruction technique. CONTRAST:  47m OMNIPAQUE IOHEXOL 350 MG/ML SOLN COMPARISON:  01/01/2022 FINDINGS: Cardiovascular: Satisfactory opacification of the pulmonary arteries to the segmental level. No evidence of pulmonary embolism. Cardiomegaly. Extensive three-vessel coronary artery calcifications and/or stents. No pericardial effusion. Aortic atherosclerosis. Aortic valve calcifications. Mediastinum/Nodes: No enlarged mediastinal, hilar, or axillary lymph nodes. Moderate hiatal hernia. Thyroid gland, trachea, and esophagus demonstrate no significant findings. Lungs/Pleura: Mild pulmonary fibrosis in a pattern with apical to basal gradient, featuring irregular peripheral interstitial opacity and septal thickening at the lung bases. No pleural effusion or pneumothorax. Upper Abdomen: No acute abnormality. Musculoskeletal: Status post right mastectomy. Redemonstrated, lobulated mass of the medial left breast overlying the left pectoralis major measuring 2.7 x 2.1 cm (series 7, image 138). No acute osseous findings. Review of the MIP images confirms the above findings. IMPRESSION: 1. Negative examination for pulmonary embolism. 2. Cardiomegaly and coronary artery disease. 3. Aortic valve calcifications. Correlate for echocardiographic evidence of aortic valve dysfunction. 4. Mild pulmonary fibrosis in a pattern with apical to basal gradient, featuring irregular peripheral interstitial opacity and septal thickening at the lung bases. This could be further characterized by interstitial lung disease protocol CT on a nonemergent, outpatient basis if clinically appropriate. 5.  Redemonstrated, lobulated mass of the medial left breast, concerning for primary breast malignancy. 6. Hiatal hernia. Aortic Atherosclerosis (ICD10-I70.0). Electronically Signed   By: ADelanna AhmadiM.D.   On: 02/04/2022 19:23   DG Chest 2 View  Result Date: 02/04/2022 CLINICAL DATA:  Shortness of breath EXAM: CHEST - 2 VIEW COMPARISON:  01/25/2022 FINDINGS: Cardiac shadow is enlarged. Aortic calcifications are noted. Postsurgical changes are again noted. Lungs are well aerated without focal infiltrate or sizable effusion. No acute bony abnormality is noted. IMPRESSION: No acute abnormality noted. Electronically Signed   By: MInez CatalinaM.D.   On: 02/04/2022 16:27   CT ABDOMEN PELVIS W CONTRAST  Result Date: 01/25/2022 CLINICAL DATA:  Blunt abdominal trauma EXAM: CT ABDOMEN AND PELVIS WITH CONTRAST TECHNIQUE: Multidetector CT imaging of the abdomen and pelvis was performed using the standard protocol following bolus administration of intravenous contrast. RADIATION DOSE REDUCTION: This exam was performed according to the departmental dose-optimization program which includes automated exposure control, adjustment of the mA and/or kV according to patient size and/or use of iterative reconstruction technique. CONTRAST:  848mOMNIPAQUE IOHEXOL 300 MG/ML  SOLN COMPARISON:  CT 12/19/2011 FINDINGS: Lower chest: Cardiomegaly. No pericardial effusion. Interlobular septal thickening and ground-glass opacities in the lung bases suggestive of pulmonary edema. Old right rib fractures. Hepatobiliary: No focal liver abnormality is seen. Layering gallstones. No biliary  dilatation. Surgical clips about the left hepatic lobe. Pancreas: Fatty atrophy of the pancreas. No ductal dilation or surrounding inflammatory changes. Spleen: No splenic injury or perisplenic hematoma. Adrenals/Urinary Tract: Adrenal glands are unremarkable. Kidneys are normal, without renal calculi, focal lesion, or hydronephrosis. Bladder is unremarkable.  Stomach/Bowel: Postsurgical changes about the GE junction. Small hiatal hernia. Normal caliber large and small bowel. Diverticulosis without diverticulitis. Postsurgical changes about the rectum. The appendix is not visualized. Vascular/Lymphatic: Advanced aortoiliac atherosclerotic calcification. No aneurysm. No abdominal or pelvic lymphadenopathy. Reproductive: Status post hysterectomy. No adnexal masses. Other: No free intraperitoneal fluid or air. Musculoskeletal: No acute fracture. Left THA. Advanced thoracolumbar spondylosis. IMPRESSION: 1.  No acute traumatic findings in the abdomen or pelvis. 2.  Cardiomegaly and pulmonary edema. 3.  Cholelithiasis. 4.  Aortic Atherosclerosis (ICD10-I70.0). Electronically Signed   By: Placido Sou M.D.   On: 01/25/2022 11:42   CT HEAD WO CONTRAST (5MM)  Result Date: 01/25/2022 CLINICAL DATA:  Neck trauma (Age >= 65y); Head trauma, minor (Age >= 65y) EXAM: CT HEAD WITHOUT CONTRAST CT CERVICAL SPINE WITHOUT CONTRAST TECHNIQUE: Multidetector CT imaging of the head and cervical spine was performed following the standard protocol without intravenous contrast. Multiplanar CT image reconstructions of the cervical spine were also generated. RADIATION DOSE REDUCTION: This exam was performed according to the departmental dose-optimization program which includes automated exposure control, adjustment of the mA and/or kV according to patient size and/or use of iterative reconstruction technique. COMPARISON:  CT head 01/01/2022. CTA neck May 18, 2019. FINDINGS: CT HEAD FINDINGS Brain: No evidence of acute infarction, hemorrhage, hydrocephalus, extra-axial collection or mass lesion/mass effect. Similar chronic microvascular ischemic disease. Similar atrophy. Vascular: No hyperdense vessel identified. Calcific intracranial atherosclerosis. Skull: No acute fracture. Sinuses/Orbits: Clear sinuses.  No acute orbital findings. Other: No mastoid effusions. CT CERVICAL SPINE FINDINGS  Alignment: Similar alignment in comparison to prior CTA. Similar reversal the normal cervical lordosis with mild anterolisthesis of C2 on C3 and C7 on T1. Broad dextrocurvature. Skull base and vertebrae: No evidence of acute fracture. Vertebral body heights are maintained. Soft tissues and spinal canal: No prevertebral fluid or swelling. No visible canal hematoma. Disc levels: Severe multilevel degenerative change including craniocervical degenerative change, greater on the right and multilevel degenerative disease with disc height loss and endplate spurring. Multilevel facet and uncovertebral hypertrophy with varying degrees of neural foraminal stenosis. Upper chest: Visualized lung apices are clear. IMPRESSION: 1. No evidence of acute intracranial abnormality. 2. No evidence of acute fracture or traumatic malalignment in the cervical spine. 3. Severe multilevel degenerative change. Electronically Signed   By: Margaretha Sheffield M.D.   On: 01/25/2022 11:39   CT Cervical Spine Wo Contrast  Result Date: 01/25/2022 CLINICAL DATA:  Neck trauma (Age >= 65y); Head trauma, minor (Age >= 65y) EXAM: CT HEAD WITHOUT CONTRAST CT CERVICAL SPINE WITHOUT CONTRAST TECHNIQUE: Multidetector CT imaging of the head and cervical spine was performed following the standard protocol without intravenous contrast. Multiplanar CT image reconstructions of the cervical spine were also generated. RADIATION DOSE REDUCTION: This exam was performed according to the departmental dose-optimization program which includes automated exposure control, adjustment of the mA and/or kV according to patient size and/or use of iterative reconstruction technique. COMPARISON:  CT head 01/01/2022. CTA neck May 18, 2019. FINDINGS: CT HEAD FINDINGS Brain: No evidence of acute infarction, hemorrhage, hydrocephalus, extra-axial collection or mass lesion/mass effect. Similar chronic microvascular ischemic disease. Similar atrophy. Vascular: No hyperdense  vessel identified. Calcific intracranial atherosclerosis. Skull: No acute  fracture. Sinuses/Orbits: Clear sinuses.  No acute orbital findings. Other: No mastoid effusions. CT CERVICAL SPINE FINDINGS Alignment: Similar alignment in comparison to prior CTA. Similar reversal the normal cervical lordosis with mild anterolisthesis of C2 on C3 and C7 on T1. Broad dextrocurvature. Skull base and vertebrae: No evidence of acute fracture. Vertebral body heights are maintained. Soft tissues and spinal canal: No prevertebral fluid or swelling. No visible canal hematoma. Disc levels: Severe multilevel degenerative change including craniocervical degenerative change, greater on the right and multilevel degenerative disease with disc height loss and endplate spurring. Multilevel facet and uncovertebral hypertrophy with varying degrees of neural foraminal stenosis. Upper chest: Visualized lung apices are clear. IMPRESSION: 1. No evidence of acute intracranial abnormality. 2. No evidence of acute fracture or traumatic malalignment in the cervical spine. 3. Severe multilevel degenerative change. Electronically Signed   By: Margaretha Sheffield M.D.   On: 01/25/2022 11:39   DG Chest 1 View  Result Date: 01/25/2022 CLINICAL DATA:  Altered mental status, fall EXAM: CHEST  1 VIEW COMPARISON:  Chest x-ray dated January 01, 2022 FINDINGS: Cardiac and mediastinal contours are unchanged. Calcifications of the thoracic aorta. Mild bibasilar opacities, likely due to atelectasis. Large pleural effusion or evidence of pneumothorax. IMPRESSION: Mild bibasilar opacities, likely due to atelectasis. Electronically Signed   By: Yetta Glassman M.D.   On: 01/25/2022 10:55   DG Knee Complete 4 Views Right  Result Date: 01/25/2022 CLINICAL DATA:  88 year old female after unwitnessed fall EXAM: RIGHT KNEE - COMPLETE 4+ VIEW COMPARISON:  Radiographs 01/01/2022 FINDINGS: No acute fracture or dislocation of the right knee. Moderate degenerative arthritis  with advanced medial compartment narrowing. Lateral compartment chondrocalcinosis. Small knee joint effusion. Vascular calcifications. Demineralization. IMPRESSION: No acute fracture of the right knee. Osteoarthritis predominantly involving the medial compartment. Electronically Signed   By: Placido Sou M.D.   On: 01/25/2022 10:54    Microbiology: No results found for this or any previous visit (from the past 240 hour(s)).   Labs: Basic Metabolic Panel: Recent Labs  Lab 02/04/22 1546 02/05/22 0222 02/06/22 0401 02/07/22 0423  NA 135 135 134* 132*  K 4.1 3.7 4.1 4.3  CL 105 103 102 102  CO2 '23 23 23 '$ 21*  GLUCOSE 77 121* 123* 114*  BUN '10 12 13 16  '$ CREATININE 1.03* 0.86 1.07* 1.20*  CALCIUM 9.2 9.5 9.4 9.4  MG  --  2.2  --   --    Liver Function Tests: No results for input(s): "AST", "ALT", "ALKPHOS", "BILITOT", "PROT", "ALBUMIN" in the last 168 hours. No results for input(s): "LIPASE", "AMYLASE" in the last 168 hours. No results for input(s): "AMMONIA" in the last 168 hours. CBC: Recent Labs  Lab 02/04/22 1546 02/05/22 0222  WBC 7.6 9.3  NEUTROABS 4.4  --   HGB 12.2 13.9  HCT 39.6 43.8  MCV 87.8 85.4  PLT 232 261   Cardiac Enzymes: No results for input(s): "CKTOTAL", "CKMB", "CKMBINDEX", "TROPONINI" in the last 168 hours. BNP: BNP (last 3 results) Recent Labs    11/29/21 1630 12/28/21 0948 02/04/22 1546  BNP 534.4* 373.8* 652.9*    ProBNP (last 3 results) No results for input(s): "PROBNP" in the last 8760 hours.  CBG: Recent Labs  Lab 02/04/22 2205  GLUCAP 104*       Signed:  Domenic Polite MD.  [facility] 02/07/2022, 2:12 PM

## 2022-02-07 NOTE — Progress Notes (Signed)
Physical Therapy Treatment Patient Details Name: [name] MRN: [id number] DOB: April 08, 1925 Today's Date: 02/07/2022   History of Present Illness The pt is a 88 yo female presenting 8/4 from [place] with SOB. Pt found to have acute CHF exacerbation and HTN emergency. PMH includes: hypothyroidism, anxiety, HTN, venous insufficiency, and GERD.    PT Comments    Pt tolerates treatment well, ambulating for increased distances. Pt reports knee discomfort which limits ambulation tolerance. PT provides LE exercise plan in an effort to improve quad activation and strengthening to reduce any potential arthritic related knee pain. Pt will benefit from continued aggressive mobilization.   Recommendations for follow up therapy are one component of a multi-disciplinary discharge planning process, led by the attending physician.  Recommendations may be updated based on patient status, additional functional criteria and insurance authorization.  Follow Up Recommendations  Home health PT     Assistance Recommended at Discharge Frequent or constant Supervision/Assistance  Patient can return home with the following A little help with walking and/or transfers;A little help with bathing/dressing/bathroom;Assistance with cooking/housework;Direct supervision/assist for medications management;Direct supervision/assist for financial management;Help with stairs or ramp for entrance   Equipment Recommendations  None recommended by PT    Recommendations for Other Services       Precautions / Restrictions Precautions Precautions: Fall Precaution Comments: HOH Restrictions Weight Bearing Restrictions: No     Mobility  Bed Mobility Overal bed mobility: Modified Independent Bed Mobility: Supine to Sit     Supine to sit: Modified independent (Device/Increase time)          Transfers Overall transfer level: Needs assistance Equipment used: Rollator (4 wheels) Transfers: Sit to/from  Stand Sit to Stand: Supervision                Ambulation/Gait Ambulation/Gait assistance: Supervision Gait Distance (Feet): 200 Feet Assistive device: Rollator (4 wheels) Gait Pattern/deviations: Step-through pattern Gait velocity: decreased Gait velocity interpretation: <1.31 ft/sec, indicative of household ambulator   General Gait Details: pt with wide turns, requiring cues from PT to maintain BOS within rollator frame   Stairs             Wheelchair Mobility    Modified Rankin (Stroke Patients Only)       Balance Overall balance assessment: Needs assistance Sitting-balance support: Feet supported, No upper extremity supported Sitting balance-Leahy Scale: Good     Standing balance support: Single extremity supported, Reliant on assistive device for balance Standing balance-Leahy Scale: Poor                              Cognition Arousal/Alertness: Awake/alert Behavior During Therapy: WFL for tasks assessed/performed Overall Cognitive Status: No family/caregiver present to determine baseline cognitive functioning Area of Impairment: Problem solving                             Problem Solving: Slow processing General Comments: difficult to fully assess 2/2 HOH        Exercises General Exercises - Lower Extremity Long Arc Quad: AROM, Both, 5 reps Heel Slides: AROM, Both, 5 reps Hip Flexion/Marching: AROM, Both, 5 reps    General Comments General comments (skin integrity, edema, etc.): VSS on RA      Pertinent Vitals/Pain Pain Assessment Pain Assessment: No/denies pain    Home Living  Prior Function            PT Goals (current goals can now be found in the care plan section) Acute Rehab PT Goals Patient Stated Goal: return home Progress towards PT goals: Progressing toward goals    Frequency    Min 3X/week      PT Plan Current plan remains appropriate     Co-evaluation              AM-PAC PT "6 Clicks" Mobility   Outcome Measure  Help needed turning from your back to your side while in a flat bed without using bedrails?: None Help needed moving from lying on your back to sitting on the side of a flat bed without using bedrails?: None Help needed moving to and from a bed to a chair (including a wheelchair)?: A Little Help needed standing up from a chair using your arms (e.g., wheelchair or bedside chair)?: A Little Help needed to walk in hospital room?: A Little Help needed climbing 3-5 steps with a railing? : A Little 6 Click Score: 20    End of Session   Activity Tolerance: Patient tolerated treatment well Patient left: in chair;with call bell/phone within reach;with chair alarm set Nurse Communication: Mobility status PT Visit Diagnosis: Unsteadiness on feet (R26.81);Other abnormalities of gait and mobility (R26.89)     Time: 0947-0962 PT Time Calculation (min) (ACUTE ONLY): 16 min  Charges:  $Gait Training: 8-22 mins                     Zenaida Niece, PT, DPT Acute Rehabilitation Office [place] 02/07/2022, 10:53 AM

## 2022-02-07 NOTE — Consult Note (Signed)
   [facility] CM Inpatient Consult   02/07/2022  [name] Sep 21, 1924 [id number]  [place] Organization: Medicare ACO REACH   Primary Care Provider:  [name], MD,  is an embedded provider with a Chronic Care Management team and program, and is listed for the transition of care follow up and appointments.  Patient was screened for extreme high risk score for unplanned readmission with 3 admissions and 6 ED visits in the past 6 months noted.  Patient reviewed for Embedded practice service needs for chronic care management/care coordination   10:10 am: Met with patient, up to chair with PT, spoke with patient to confirm PCP and [name] she states ILF.  She endorses [name] as her primary care. Patient states she feels her needs are met at the facility but states, "I don't feel all that great today but at least my headache is gone."  Plan: Continue to follow.  A referral request can be sent to the Embedded Care Management and made aware of TOC needs for post hospital needs.  Please contact for further questions,  Natividad Brood, RN BSN [facility] Liaison  [phone] business mobile phone Toll free office [phone]  Fax number: [phone] [name]

## 2022-02-07 NOTE — TOC Transition Note (Addendum)
Transition of Care [facility]) - CM/SW Discharge Note   Patient Details  Name: [name] MRN: [id number] Date of Birth: 11/07/1924  Transition of Care [facility]) CM/SW Contact:  Zenon Mayo, RN Phone Number: 02/07/2022, 2:45 PM   Clinical Narrative:    Patient is from [name] ID[facility], she would like to get HHPT thru [facility] on site there,  This NCM faxed the HHPT order to [facility] at [phone].  Spoke with [facility] rep at [phone] they are familiar with patient.  Patient states she would like to take the ambulance , the same way she came to the hospital.  NCM scheduled [facility] , the ETA for pickup is around 4 or 5 pm.  NCM spoke with daughter and made her aware of the above information.   Final next level of care: [place] Barriers to Discharge: No Barriers Identified   Patient Goals and CMS Choice Patient states their goals for this hospitalization and ongoing recovery are:: return to her IDL      Discharge Placement                       Discharge Plan and Services                  DME Agency: NA                  Social Determinants of Health (SDOH) Interventions     Readmission Risk Interventions     No data to display

## 2022-02-07 NOTE — Progress Notes (Signed)
Heart Failure Navigator Progress Note  Assessed for Heart & Vascular TOC clinic readiness.  Patient does not meet criteria per Dr. Broadus John.    Earnestine Leys, BSN, Clinical cytogeneticist Only

## 2022-02-07 NOTE — Progress Notes (Signed)
   02/07/22 1300  Mobility  Activity Ambulated with assistance in hallway  Level of Assistance Contact guard assist, steadying assist  Assistive Device Front wheel walker  Distance Ambulated (ft) 60 ft  Activity Response Tolerated well  $Mobility charge 1 Mobility   Mobility Specialist Progress Note  Pt was in chair & agreeable. Had some complaints of right leg pain while ambulating. Left back in chair w/ call bell in reach   Lucious Groves Mobility Specialist

## 2022-02-08 DIAGNOSIS — R2689 Other abnormalities of gait and mobility: Secondary | ICD-10-CM | POA: Diagnosis not present

## 2022-02-08 DIAGNOSIS — M6281 Muscle weakness (generalized): Secondary | ICD-10-CM | POA: Diagnosis not present

## 2022-02-08 DIAGNOSIS — R41841 Cognitive communication deficit: Secondary | ICD-10-CM | POA: Diagnosis not present

## 2022-02-08 DIAGNOSIS — R488 Other symbolic dysfunctions: Secondary | ICD-10-CM | POA: Diagnosis not present

## 2022-02-08 DIAGNOSIS — M25512 Pain in left shoulder: Secondary | ICD-10-CM | POA: Diagnosis not present

## 2022-02-09 DIAGNOSIS — R41841 Cognitive communication deficit: Secondary | ICD-10-CM | POA: Diagnosis not present

## 2022-02-09 DIAGNOSIS — R2689 Other abnormalities of gait and mobility: Secondary | ICD-10-CM | POA: Diagnosis not present

## 2022-02-09 DIAGNOSIS — M25512 Pain in left shoulder: Secondary | ICD-10-CM | POA: Diagnosis not present

## 2022-02-09 DIAGNOSIS — M6281 Muscle weakness (generalized): Secondary | ICD-10-CM | POA: Diagnosis not present

## 2022-02-09 DIAGNOSIS — R488 Other symbolic dysfunctions: Secondary | ICD-10-CM | POA: Diagnosis not present

## 2022-02-10 DIAGNOSIS — R41841 Cognitive communication deficit: Secondary | ICD-10-CM | POA: Diagnosis not present

## 2022-02-10 DIAGNOSIS — M25512 Pain in left shoulder: Secondary | ICD-10-CM | POA: Diagnosis not present

## 2022-02-10 DIAGNOSIS — R488 Other symbolic dysfunctions: Secondary | ICD-10-CM | POA: Diagnosis not present

## 2022-02-10 DIAGNOSIS — M6281 Muscle weakness (generalized): Secondary | ICD-10-CM | POA: Diagnosis not present

## 2022-02-10 DIAGNOSIS — R2689 Other abnormalities of gait and mobility: Secondary | ICD-10-CM | POA: Diagnosis not present

## 2022-02-11 DIAGNOSIS — R2689 Other abnormalities of gait and mobility: Secondary | ICD-10-CM | POA: Diagnosis not present

## 2022-02-11 DIAGNOSIS — R41841 Cognitive communication deficit: Secondary | ICD-10-CM | POA: Diagnosis not present

## 2022-02-11 DIAGNOSIS — M25512 Pain in left shoulder: Secondary | ICD-10-CM | POA: Diagnosis not present

## 2022-02-11 DIAGNOSIS — M6281 Muscle weakness (generalized): Secondary | ICD-10-CM | POA: Diagnosis not present

## 2022-02-11 DIAGNOSIS — R488 Other symbolic dysfunctions: Secondary | ICD-10-CM | POA: Diagnosis not present

## 2022-02-14 DIAGNOSIS — R2689 Other abnormalities of gait and mobility: Secondary | ICD-10-CM | POA: Diagnosis not present

## 2022-02-14 DIAGNOSIS — M6281 Muscle weakness (generalized): Secondary | ICD-10-CM | POA: Diagnosis not present

## 2022-02-14 DIAGNOSIS — R41841 Cognitive communication deficit: Secondary | ICD-10-CM | POA: Diagnosis not present

## 2022-02-14 DIAGNOSIS — R488 Other symbolic dysfunctions: Secondary | ICD-10-CM | POA: Diagnosis not present

## 2022-02-14 DIAGNOSIS — M25512 Pain in left shoulder: Secondary | ICD-10-CM | POA: Diagnosis not present

## 2022-02-15 DIAGNOSIS — M25512 Pain in left shoulder: Secondary | ICD-10-CM | POA: Diagnosis not present

## 2022-02-15 DIAGNOSIS — M6281 Muscle weakness (generalized): Secondary | ICD-10-CM | POA: Diagnosis not present

## 2022-02-15 DIAGNOSIS — R41841 Cognitive communication deficit: Secondary | ICD-10-CM | POA: Diagnosis not present

## 2022-02-15 DIAGNOSIS — R488 Other symbolic dysfunctions: Secondary | ICD-10-CM | POA: Diagnosis not present

## 2022-02-15 DIAGNOSIS — R2689 Other abnormalities of gait and mobility: Secondary | ICD-10-CM | POA: Diagnosis not present

## 2022-02-16 ENCOUNTER — Emergency Department (HOSPITAL_COMMUNITY)
Admission: EM | Admit: 2022-02-16 | Discharge: 2022-02-16 | Disposition: A | Discharge status: Skilled Nursing Facility | Payer: Medicare Other | Attending: Emergency Medicine | Admitting: Emergency Medicine

## 2022-02-16 ENCOUNTER — Emergency Department (HOSPITAL_COMMUNITY): Payer: Medicare Other

## 2022-02-16 ENCOUNTER — Other Ambulatory Visit: Payer: Self-pay

## 2022-02-16 DIAGNOSIS — E039 Hypothyroidism, unspecified: Secondary | ICD-10-CM | POA: Insufficient documentation

## 2022-02-16 DIAGNOSIS — M6281 Muscle weakness (generalized): Secondary | ICD-10-CM | POA: Diagnosis not present

## 2022-02-16 DIAGNOSIS — R079 Chest pain, unspecified: Secondary | ICD-10-CM | POA: Insufficient documentation

## 2022-02-16 DIAGNOSIS — I130 Hypertensive heart and chronic kidney disease with heart failure and stage 1 through stage 4 chronic kidney disease, or unspecified chronic kidney disease: Secondary | ICD-10-CM | POA: Diagnosis not present

## 2022-02-16 DIAGNOSIS — R42 Dizziness and giddiness: Secondary | ICD-10-CM | POA: Diagnosis not present

## 2022-02-16 DIAGNOSIS — Z79899 Other long term (current) drug therapy: Secondary | ICD-10-CM | POA: Diagnosis not present

## 2022-02-16 DIAGNOSIS — M25512 Pain in left shoulder: Secondary | ICD-10-CM | POA: Diagnosis not present

## 2022-02-16 DIAGNOSIS — R2689 Other abnormalities of gait and mobility: Secondary | ICD-10-CM | POA: Diagnosis not present

## 2022-02-16 DIAGNOSIS — R41841 Cognitive communication deficit: Secondary | ICD-10-CM | POA: Diagnosis not present

## 2022-02-16 DIAGNOSIS — I509 Heart failure, unspecified: Secondary | ICD-10-CM | POA: Diagnosis not present

## 2022-02-16 DIAGNOSIS — R6889 Other general symptoms and signs: Secondary | ICD-10-CM | POA: Diagnosis not present

## 2022-02-16 DIAGNOSIS — R488 Other symbolic dysfunctions: Secondary | ICD-10-CM | POA: Diagnosis not present

## 2022-02-16 DIAGNOSIS — N189 Chronic kidney disease, unspecified: Secondary | ICD-10-CM | POA: Diagnosis not present

## 2022-02-16 DIAGNOSIS — R0789 Other chest pain: Secondary | ICD-10-CM | POA: Diagnosis not present

## 2022-02-16 DIAGNOSIS — Z20822 Contact with and (suspected) exposure to covid-19: Secondary | ICD-10-CM | POA: Insufficient documentation

## 2022-02-16 DIAGNOSIS — R0689 Other abnormalities of breathing: Secondary | ICD-10-CM | POA: Diagnosis not present

## 2022-02-16 DIAGNOSIS — Z743 Need for continuous supervision: Secondary | ICD-10-CM | POA: Diagnosis not present

## 2022-02-16 DIAGNOSIS — R7989 Other specified abnormal findings of blood chemistry: Secondary | ICD-10-CM | POA: Diagnosis not present

## 2022-02-16 DIAGNOSIS — R0602 Shortness of breath: Secondary | ICD-10-CM | POA: Diagnosis not present

## 2022-02-16 DIAGNOSIS — Z7401 Bed confinement status: Secondary | ICD-10-CM | POA: Diagnosis not present

## 2022-02-16 DIAGNOSIS — R404 Transient alteration of awareness: Secondary | ICD-10-CM | POA: Diagnosis not present

## 2022-02-16 LAB — CBC
HCT: 38.9 % (ref 36.0–46.0)
Hemoglobin: 12.3 g/dL (ref 12.0–15.0)
MCH: 27.4 pg (ref 26.0–34.0)
MCHC: 31.6 g/dL (ref 30.0–36.0)
MCV: 86.6 fL (ref 80.0–100.0)
Platelets: 162 10*3/uL (ref 150–400)
RBC: 4.49 MIL/uL (ref 3.87–5.11)
RDW: 17.8 % — ABNORMAL HIGH (ref 11.5–15.5)
WBC: 6.8 10*3/uL (ref 4.0–10.5)
nRBC: 0 % (ref 0.0–0.2)

## 2022-02-16 LAB — URINALYSIS, ROUTINE W REFLEX MICROSCOPIC
Bilirubin Urine: NEGATIVE
Glucose, UA: NEGATIVE mg/dL
Hgb urine dipstick: NEGATIVE
Ketones, ur: NEGATIVE mg/dL
Leukocytes,Ua: NEGATIVE
Nitrite: NEGATIVE
Protein, ur: NEGATIVE mg/dL
Specific Gravity, Urine: 1.012 (ref 1.005–1.030)
pH: 7 (ref 5.0–8.0)

## 2022-02-16 LAB — COMPREHENSIVE METABOLIC PANEL
ALT: 14 U/L (ref 0–44)
AST: 20 U/L (ref 15–41)
Albumin: 3.5 g/dL (ref 3.5–5.0)
Alkaline Phosphatase: 50 U/L (ref 38–126)
Anion gap: 8 (ref 5–15)
BUN: 13 mg/dL (ref 8–23)
CO2: 23 mmol/L (ref 22–32)
Calcium: 9.5 mg/dL (ref 8.9–10.3)
Chloride: 105 mmol/L (ref 98–111)
Creatinine, Ser: 1.2 mg/dL — ABNORMAL HIGH (ref 0.44–1.00)
GFR, Estimated: 42 mL/min — ABNORMAL LOW (ref >60)
Glucose, Bld: 97 mg/dL (ref 70–99)
Potassium: 4.3 mmol/L (ref 3.5–5.1)
Sodium: 136 mmol/L (ref 135–145)
Total Bilirubin: 0.6 mg/dL (ref 0.3–1.2)
Total Protein: 6.1 g/dL — ABNORMAL LOW (ref 6.5–8.1)

## 2022-02-16 LAB — TROPONIN I (HIGH SENSITIVITY)
Troponin I (High Sensitivity): 14 ng/L (ref <18)
Troponin I (High Sensitivity): 21 ng/L — ABNORMAL HIGH (ref <18)

## 2022-02-16 LAB — RESP PANEL BY RT-PCR (FLU A&B, COVID) ARPGX2
Influenza A by PCR: NEGATIVE
Influenza B by PCR: NEGATIVE
SARS Coronavirus 2 by RT PCR: NEGATIVE

## 2022-02-16 LAB — BRAIN NATRIURETIC PEPTIDE: B Natriuretic Peptide: 515.6 pg/mL — ABNORMAL HIGH (ref 0.0–100.0)

## 2022-02-16 LAB — D-DIMER, QUANTITATIVE: D-Dimer, Quant: 2.27 ug/mL-FEU — ABNORMAL HIGH (ref 0.00–0.50)

## 2022-02-16 MED ORDER — HYDRALAZINE HCL 20 MG/ML IJ SOLN
15.0000 mg | Freq: Once | INTRAMUSCULAR | Status: AC
  Administered 2022-02-16: 15 mg via INTRAVENOUS
  Filled 2022-02-16: qty 1

## 2022-02-16 MED ORDER — ASPIRIN 81 MG PO CHEW
324.0000 mg | CHEWABLE_TABLET | Freq: Once | ORAL | Status: AC
  Administered 2022-02-16: 324 mg via ORAL
  Filled 2022-02-16: qty 4

## 2022-02-16 MED ORDER — IOHEXOL 350 MG/ML SOLN
100.0000 mL | Freq: Once | INTRAVENOUS | Status: AC | PRN
  Administered 2022-02-16: 100 mL via INTRAVENOUS

## 2022-02-16 NOTE — ED Notes (Signed)
Report called to [facility], spoke with Nicole Kindred, facility has agreed to accept [facility] once pt has arrived back to their facility, all questions and concerns address. [facility] called for transport

## 2022-02-16 NOTE — ED Triage Notes (Signed)
Pt BIB EMS for shortness of breath with exertion x3 days. Pt had one episode of dizziness and chest pain while exercising. The chest pain and the dizziness has resolved, but the shortness of breath is continued.   EMS Vitals 30 RR

## 2022-02-16 NOTE — ED Notes (Signed)
[facility] here to transport pt ?

## 2022-02-16 NOTE — ED Provider Notes (Signed)
[place] EMERGENCY DEPARTMENT Provider Note   CSN: [id number] Arrival date & time: 02/16/22  1356     History  Chief Complaint  Patient presents with   Shortness of Breath    [name] is a 88 y.o. female with an extensive past medical history inclusive of CVA, CKD, hypertension, paroxysmal A-fib and DVT/PE presenting today with shortness of breath.  Patient tells me that she feels normal and that she is not short of breath.  She is not sure why she is here.  I am unable to see a history of dementia in the chart.  Per chart review, she was discharged from the hospital 1 week ago for a acute on chronic heart failure exacerbation.      Shortness of Breath      Home Medications Prior to Admission medications   Medication Sig Start Date End Date Taking? Authorizing Provider  acetaminophen (TYLENOL) 500 MG tablet Take 1,000 mg by mouth every 6 (six) hours as needed for headache (pain).    [provider]  amiodarone (PACERONE) 200 MG tablet Take 200 mg by mouth daily at 12 noon. 12/11/21   [provider]  atorvastatin (LIPITOR) 40 MG tablet Take 1 tablet (40 mg total) by mouth daily at 6 PM. Patient taking differently: Take 40 mg by mouth every evening. 09/02/19   Dixie Dials, MD  clopidogrel (PLAVIX) 75 MG tablet Take 1 tablet (75 mg total) by mouth daily. Patient taking differently: Take 75 mg by mouth daily at 12 noon. 09/02/19   Dixie Dials, MD  diclofenac sodium (VOLTAREN) 1 % GEL APPLY 2-4 GRAMS TO AFFECTED AREA 2-3 TIMES A DAY AS NEEDED. Patient taking differently: Apply 1 Application topically 2 (two) times daily as needed (joint pain). 04/30/19   Meredith Pel, MD  esomeprazole (NEXIUM) 40 MG capsule Take 40 mg by mouth every morning.    [provider]  furosemide (LASIX) 40 MG tablet Take 0.5 tablets (20 mg total) by mouth daily. 02/07/22   Domenic Polite, MD  hydrALAZINE (APRESOLINE) 10 MG tablet Take 1 tablet  (10 mg total) by mouth 2 (two) times daily. 12/02/21   Dixie Dials, MD  levothyroxine (SYNTHROID) 75 MCG tablet Take 1 tablet (75 mcg total) by mouth daily. Patient taking differently: Take 75 mcg by mouth every morning. 01/03/22   Pokhrel, Corrie Mckusick, MD  Melatonin 3 MG TABS Take 3 mg by mouth at bedtime as needed (sleep).     [provider]  metoprolol tartrate (LOPRESSOR) 25 MG tablet Take 25 mg by mouth See admin instructions. Take one tablet (25 mg) by mouth twice daily - noon and evening 12/11/21   [provider]  nitroGLYCERIN (NITROSTAT) 0.4 MG SL tablet Place 1 tablet (0.4 mg total) under the tongue every 5 (five) minutes x 3 doses as needed for chest pain. 09/02/19   Dixie Dials, MD  Polyethyl Glycol-Propyl Glycol (SYSTANE) 0.4-0.3 % GEL ophthalmic gel Place 1 application into both eyes at bedtime.    [provider]  potassium chloride SA (KLOR-CON M20) 20 MEQ tablet Take 1 tablet (20 mEq total) by mouth every Monday, Wednesday, and Friday. Patient taking differently: Take 20 mEq by mouth daily at 12 noon. 12/03/21   Dixie Dials, MD      Allergies    Codeine and Pregabalin    Review of Systems   Review of Systems  Respiratory:  Positive for shortness of breath.     Physical Exam Updated  Vital Signs BP (!) 182/72   Pulse (!) 56   Temp 98 F (36.7 C)   Resp (!) 21   Ht '5\' 7"'$  (1.702 m)   Wt 70 kg   SpO2 100%   BMI 24.17 kg/m  Physical Exam Vitals and nursing note reviewed.  Constitutional:      General: She is not in acute distress.    Appearance: Normal appearance. She is well-developed. She is not ill-appearing.  HENT:     Head: Normocephalic and atraumatic.     Mouth/Throat:     Mouth: Mucous membranes are moist.     Pharynx: Oropharynx is clear.  Eyes:     General: No scleral icterus.    Conjunctiva/sclera: Conjunctivae normal.  Cardiovascular:     Rate and Rhythm: Normal rate and regular rhythm.  Pulmonary:     Effort: Pulmonary effort  is normal. No respiratory distress.     Breath sounds: No decreased breath sounds.  Chest:     Chest wall: No tenderness.  Skin:    General: Skin is warm and dry.     Findings: No rash.  Neurological:     Mental Status: She is alert.  Psychiatric:        Mood and Affect: Mood normal.     ED Results / Procedures / Treatments   Labs (all labs ordered are listed, but only abnormal results are displayed) Labs Reviewed - No data to display  EKG EKG Interpretation  Date/Time:  Wednesday February 16 2022 14:20:18 EDT Ventricular Rate:  55 PR Interval:  256 QRS Duration: 163 QT Interval:  490 QTC Calculation: 469 R Axis:   -51 Text Interpretation: Sinus rhythm Prolonged PR interval Left bundle branch block No significant change since last tracing Confirmed by Dorie Rank [phone]) on 02/16/2022 2:23:16 PM  Radiology No results found.  Procedures Procedures   Medications Ordered in ED Medications - No data to display  ED Course/ Medical Decision Making/ A&P Clinical Course as of 02/16/22 1550  Wed Feb 16, 2022  1440 I called the patient's daughter and grandson multiple times without any answer.  I called her living facility who stated that the rehab nurse had called 911 but they do not have any further information. [MR]  2993 I spoke with the patient's daughter who reports that her living facility contacted her stating that the patient was complaining of 10 out of 10 chest pain radiating down her left arm.  Associated shortness of breath.  Daughter would like to be updated throughout patient's evaluation [phone]) [MR]    Clinical Course User Index [MR] Toshiye Kever, Cecilio Asper, PA-C                           Medical Decision Making Amount and/or Complexity of Data Reviewed Labs: ordered. Radiology: ordered.  Risk Prescription drug management.   This patient presents to the ED for concern of sob? The emergent differential diagnosis for shortness of breath includes, but is  not limited to, Pulmonary edema, bronchoconstriction, Pneumonia, Pulmonary embolism, Pneumotherax/ Hemothorax, Dysrythmia, ACS.   This is not an exhaustive differential.    Past Medical History / Co-morbidities / Social History: PE, CHF, hypothyroidism   Additional history: Obtained by chart review.  I reviewed patient's hospitalization where she was discharged last week.  She was admitted for acute on chronic heart failure exacerbation.  At that time she was short of breath, I am unsure if this is related to  her visit for shortness of breath that she currently denies.   Physical Exam: Pertinent physical exam findings include well-appearing.  No increase in work of breathing.  Lab Tests: At this time   Imaging Studies: Pending at this time   Cardiac Monitoring:  The patient was maintained on a cardiac monitor.  My attending physician Tomi Bamberger viewed and interpreted the cardiac monitored which showed an underlying rhythm of: NSR   Medications: No medications ordered, patient denies any symptoms at this time.  Nursing noted patient to be hypertensive.  Given hydralazine instead of labetalol secondary to borderline bradycardia.  Disposition: 88 year old female who presented today with shortness of breath, dizziness and chest pain.  History of PE, no longer anticoagulated.  Patient continues to deny any symptoms however we will pursue chest pain work-up to include a D-dimer.  Labs pending.  Patient signed out to Dorise Bullion, PA-C.  Please see her note for ultimate disposition.    I discussed this case with my attending physician Dr. Tomi Bamberger who cosigned this note including patient's presenting symptoms, physical exam, and planned diagnostics and interventions. Attending physician stated agreement with plan or made changes to plan which were implemented.      Final Clinical Impression(s) / ED Diagnoses Final diagnoses:  None      Rhae Hammock, PA-C 02/16/22 1550    Dorie Rank, MD 02/17/22 [phone]

## 2022-02-16 NOTE — ED Provider Notes (Cosign Needed Addendum)
Physical Exam  BP (!) 143/71   Pulse 72   Temp 98.2 F (36.8 C)   Resp 17   Ht '5\' 7"'$  (1.702 m)   Wt 70 kg   SpO2 93%   BMI 24.17 kg/m   Physical Exam Vitals and nursing note reviewed.  Constitutional:      General: She is not in acute distress.    Appearance: She is not ill-appearing, toxic-appearing or diaphoretic.  HENT:     Head: Normocephalic and atraumatic.     Mouth/Throat:     Pharynx: Oropharynx is clear.  Cardiovascular:     Rate and Rhythm: Normal rate and regular rhythm.     Pulses: Normal pulses.  Pulmonary:     Effort: Pulmonary effort is normal. No tachypnea, accessory muscle usage, respiratory distress or retractions.     Breath sounds: No decreased air movement. Rhonchi present. No decreased breath sounds or wheezing.  Chest:     Chest wall: Mass (Known mass of left chest wall) and tenderness present.  Abdominal:     Palpations: Abdomen is soft.     Tenderness: There is no abdominal tenderness. There is no guarding.  Musculoskeletal:     Cervical back: Neck supple.     Right lower leg: No tenderness. No edema.     Left lower leg: No tenderness. No edema.  Skin:    General: Skin is warm and dry.     Capillary Refill: Capillary refill takes less than 2 seconds.     Coloration: Skin is not cyanotic or pale.     Findings: Ecchymosis (Several small mild circular yellowed ecchymosis of abdomen) present. No erythema.  Neurological:     Mental Status: She is alert.     Comments: Poor cognition/communication at baseline  Psychiatric:        Mood and Affect: Mood is anxious.    Procedures  Procedures  ED Course / MDM   Clinical Course as of 02/18/22 0105  Wed Feb 16, 2022  1440 I called the patient's daughter and grandson multiple times without any answer.  I called her living facility who stated that the rehab nurse had called 911 but they do not have any further information. [MR]  8250 I spoke with the patient's daughter who reports that her living  facility contacted her stating that the patient was complaining of 10 out of 10 chest pain radiating down her left arm.  Associated shortness of breath.  Daughter would like to be updated throughout patient's evaluation [phone] [MR]  1547 D-Dimer, Quant(!): 2.27 Age adjusted to 0.94 [AC]  1646 Attempted to call daughter's listed numbers several times without any answer.  Called her living facility as well and was informed they do not have any further information on her available at this time. [AC]  1700 I spoke with pt's daughter.  Daughter states the DNR is active and that she is the power of attorney.  States they followed up with PCP following recent hospital admission and PCP is aware of the possible primary breast malignancy, however the patient is unaware.  Daughter has requested that pt NOT be informed of it. [AC]  1853 Pt no longer with chest pain or complaints.  Keeps repeating "something doesn't feel right" and "something is wrong".  Per pt's daughter, repeats this at baseline for at least the last few months -- not new. [AC]  1901 CT Angio Chest PE W/Cm &/Or Wo Cm Negative for PE.  Confirmed mass of left breast as  visualized on recent imaging [AC]  1917 I spoke with pt's daughter and discussed negative findings, including no evidence of PE.  Daughter aware of BNP and troponin levels.  Elevated D-dimer may be due to malignancy.  Discussed admission/further workup vs close PCP follow up.  Daughter stresses she does not want pt to go through invasive studies/treatment.  Shared decision making to follow up with PCP to discuss what treatment options are explorable vs to be left alone.   [AC]    Clinical Course User Index [AC] Prince Rome, PA-C [MR] Redwine, Cecilio Asper, PA-C   Medical Decision Making Amount and/or Complexity of Data Reviewed Labs: ordered. Decision-making details documented in ED Course. Radiology: ordered. Decision-making details documented in ED  Course.  Risk OTC drugs. Prescription drug management.   Patient signed out to me at shift change.  Please see previous provider note for further details.  In short, this is a 29 yof with complaint of sudden chest pain radiating into left arm with associated shortness of breath.  No active chest pain or shortness of breath.  Still hypertensive.  Given hydralazine to decrease blood pressure considering mildly low heart rate.  Early in ACS workup.  Disposition dependent on findings.  Per pt is DNR, documentation present in chart.  Daughter is HCPOA.  Recently discharged 1 week ago for acute CHF exacerbation.  Minimal to no lower extremity edema.  Without clinical evidence of fluid overload.  Chest mildly TTP.  Hx of DVT/PE.  On plavix, however no anticoagulation.  Poor historian.  1610: Appears to have some evidence chronic cognitive decline, described in previous notes as "dementia", "cognitive communication deficit", and "chronic memory issues".  Was seen for similar symptoms at that time.  Pt denies history of pacemaker placement, recent imaging suggests possible primary breast malignancy of left breast.  Initial troponin without notable elevation.  However significantly elevated D-Dimer (see above).  Satting at 99-100%.  With possibility of breast malignancy, prior PE and DVT, significant change in D-Dimer, and chest pain/shortness of breath, plan to assess for possible PE.  ASA '324mg'$  provided, nitroglycerin withheld due to pt's wavering blood pressure.  1853: Pt no longer with chest pain.  CTA negative for PE.  BNP mildly elevated, however still declining from recent hospital visit for acute CHF exacerbation.  No clinical evidence of DVT.    Workup pertinent findings: -- CXR: stable cardiomegaly with possible mild superimposed edema  -- Troponins: initial 14, sequential 21 -- D-Dimer: initial 2.27, age adjusted 0.94 (most recent was 0.64 on 02/04/22). -- BNP: 515.6 (most recent of 652.9 on  02/04/22) -- EKG: LBBB and prolonged PR interval, ventricular rate of 55 bpm, no significant changes since last tracing -- UA: Unremarkable -- CTA chest: Negative for PE, without significant changes from prior  CLINICAL DATA:  88 year old female with dyspnea, positive D-dimer;  PE suspected    EXAM:  CT ANGIOGRAPHY CHEST WITH CONTRAST    TECHNIQUE:  Multidetector CT imaging of the chest was performed using the  standard protocol during bolus administration of intravenous  contrast. Multiplanar CT image reconstructions and MIPs were  obtained to evaluate the vascular anatomy.    RADIATION DOSE REDUCTION: This exam was performed according to the  departmental dose-optimization program which includes automated  exposure control, adjustment of the mA and/or kV according to  patient size and/or use of iterative reconstruction technique.    CONTRAST:  163m OMNIPAQUE IOHEXOL 350 MG/ML SOLN    COMPARISON:  Radiographs earlier today  and CT chest 02/04/2022    FINDINGS:  Cardiovascular: Satisfactory opacification of the pulmonary arteries  to the segmental level. No evidence of pulmonary embolism. Mild  cardiomegaly. Aortic and coronary artery atherosclerotic  calcification. Mitral annular and aortic valve calcification. No  pericardial effusion.    Mediastinum/Nodes: No thoracic adenopathy by size. Unremarkable  thyroid. Small hiatal hernia. The trachea is patent.    Lungs/Pleura: Irregular peripheral predominant interlobular septal  thickening, bronchial wall thickening greatest in the lower lobes  and mosaic attenuation compatible with air trapping. Scattered  mucous plugging. No pleural effusion or pneumothorax.    Upper Abdomen: Cholecystectomy. No acute abnormality in the upper  abdomen.    Musculoskeletal: Postoperative changes of right mastectomy.  Redemonstrated lobulated mass in the medial left breast measuring  2.4 x 2.3 cm.    Review of the MIP images confirms the  above findings.    IMPRESSION:  Negative for acute pulmonary embolism.    Bronchial wall thickening and mucous plugging with air trapping  consistent with airway infection/inflammation, similar to prior.    Interstitial thickening greatest in the lower lungs may be related  to pulmonary fibrosis though superimposed edema could appear  similarly.    Cardiomegaly and coronary artery disease.    Aortic valve calcification.  Aortic Atherosclerosis (ICD10-I70.0).      Electronically Signed    By: Placido Sou M.D.    On: 02/16/2022 18:33    Medications: -- Hydralazine 15 mg, blood pressure has improved to 142/62 -- ASA '324mg'$   Consultations -- None  Plan -- Complete resolution of chest pain and shortness of breath since arrival per patient.  Chest pain may be related to likely malignant mass of left chest and/or chronic decreased cardiac function.  PE workup negative.  BNP still with mild elevation from baseline, however appears to be declining from 2 weeks ago.  No clinical evidence of DVT.  Elevated D-Dimer may be related to possible malignancy.  ACS workup overall not suggestive of myocardial infarction.  Again pt is DNR, and invasive measures are not desired by pt's daughter the HCPOA.  Considered admission with advanced workup vs close outpatient follow up with pt's daughter.  Pt's daughter elected to continue with close PCP follow up as planned.  Recommended discussing possible future care measures and guidelines in depth with pt's daughter, which may or may not include oncology, comfort care, or hospice.  She is in agreement and has no further questions.    After consideration the patient's encounter today, I do not feel today's workup suggests an emergent condition requiring admission or immediate intervention beyond what has been performed at this time.  Safe for discharge; instructed to return immediately for worsening symptoms, change in symptoms or any other concerns.  I have  reviewed the patients home medicines and have made adjustments as needed.       I discussed this case with my attending physician Dr. Maryan Rued, who agreed with the proposed treatment course and cosigned this note including patient's presenting symptoms, physical exam, and planned diagnostics and interventions.  Attending physician stated agreement with plan or made changes to plan which were implemented.     This chart was dictated using voice recognition software.  Despite best efforts to proofread, errors can occur which can change the documentation meaning.        Prince Rome, PA-C 68/34/19 6222    Prince Rome, PA-C 97/98/92 1194    Blanchie Dessert, MD 02/21/22 [phone]

## 2022-02-16 NOTE — Discharge Instructions (Addendum)
Please call Dr. Lina Sar office to follow-up within the next 2 to 3 days for reevaluation and continued medical management.  Recommend exploring avenues of care regarding her current state of health, recent imaging findings of mass, and treatment avenues to explore and not to explore.  This may or may not include oncology, comfort care, or hospice measures.    Your imaging did not indicate a pulmonary embolism today.  Return to the ED for new or worsening symptoms as discussed.

## 2022-02-17 ENCOUNTER — Telehealth: Payer: Self-pay

## 2022-02-17 DIAGNOSIS — M25512 Pain in left shoulder: Secondary | ICD-10-CM | POA: Diagnosis not present

## 2022-02-17 DIAGNOSIS — R488 Other symbolic dysfunctions: Secondary | ICD-10-CM | POA: Diagnosis not present

## 2022-02-17 DIAGNOSIS — R2689 Other abnormalities of gait and mobility: Secondary | ICD-10-CM | POA: Diagnosis not present

## 2022-02-17 DIAGNOSIS — M6281 Muscle weakness (generalized): Secondary | ICD-10-CM | POA: Diagnosis not present

## 2022-02-17 DIAGNOSIS — R41841 Cognitive communication deficit: Secondary | ICD-10-CM | POA: Diagnosis not present

## 2022-02-17 NOTE — Telephone Encounter (Signed)
        Patient  visited [facility] on 8/16  for CHEST PAIN   Telephone encounter attempt :  1ST  CAN NOT [place], Care Management  [phone] [address], [place], [place] [address] Phone: [phone] Email: [email or web address]

## 2022-02-18 ENCOUNTER — Emergency Department (HOSPITAL_COMMUNITY)
Admission: EM | Admit: 2022-02-18 | Discharge: 2022-02-18 | Disposition: A | Discharge status: Skilled Nursing Facility | Payer: Medicare Other | Attending: Emergency Medicine | Admitting: Emergency Medicine

## 2022-02-18 ENCOUNTER — Telehealth: Payer: Self-pay

## 2022-02-18 ENCOUNTER — Emergency Department (HOSPITAL_COMMUNITY): Payer: Medicare Other

## 2022-02-18 ENCOUNTER — Other Ambulatory Visit: Payer: Self-pay

## 2022-02-18 ENCOUNTER — Encounter (HOSPITAL_COMMUNITY): Payer: Self-pay

## 2022-02-18 DIAGNOSIS — R41841 Cognitive communication deficit: Secondary | ICD-10-CM | POA: Diagnosis not present

## 2022-02-18 DIAGNOSIS — Z743 Need for continuous supervision: Secondary | ICD-10-CM | POA: Diagnosis not present

## 2022-02-18 DIAGNOSIS — Z85038 Personal history of other malignant neoplasm of large intestine: Secondary | ICD-10-CM | POA: Insufficient documentation

## 2022-02-18 DIAGNOSIS — E039 Hypothyroidism, unspecified: Secondary | ICD-10-CM | POA: Insufficient documentation

## 2022-02-18 DIAGNOSIS — I509 Heart failure, unspecified: Secondary | ICD-10-CM | POA: Diagnosis not present

## 2022-02-18 DIAGNOSIS — Z7401 Bed confinement status: Secondary | ICD-10-CM | POA: Diagnosis not present

## 2022-02-18 DIAGNOSIS — M25512 Pain in left shoulder: Secondary | ICD-10-CM | POA: Diagnosis not present

## 2022-02-18 DIAGNOSIS — Z853 Personal history of malignant neoplasm of breast: Secondary | ICD-10-CM | POA: Diagnosis not present

## 2022-02-18 DIAGNOSIS — I110 Hypertensive heart disease with heart failure: Secondary | ICD-10-CM | POA: Diagnosis not present

## 2022-02-18 DIAGNOSIS — R531 Weakness: Secondary | ICD-10-CM | POA: Diagnosis not present

## 2022-02-18 DIAGNOSIS — Z7902 Long term (current) use of antithrombotics/antiplatelets: Secondary | ICD-10-CM | POA: Insufficient documentation

## 2022-02-18 DIAGNOSIS — M6281 Muscle weakness (generalized): Secondary | ICD-10-CM | POA: Diagnosis not present

## 2022-02-18 DIAGNOSIS — R0602 Shortness of breath: Secondary | ICD-10-CM | POA: Insufficient documentation

## 2022-02-18 DIAGNOSIS — R079 Chest pain, unspecified: Secondary | ICD-10-CM | POA: Diagnosis not present

## 2022-02-18 DIAGNOSIS — Z79899 Other long term (current) drug therapy: Secondary | ICD-10-CM | POA: Insufficient documentation

## 2022-02-18 DIAGNOSIS — M79603 Pain in arm, unspecified: Secondary | ICD-10-CM | POA: Diagnosis not present

## 2022-02-18 DIAGNOSIS — R488 Other symbolic dysfunctions: Secondary | ICD-10-CM | POA: Diagnosis not present

## 2022-02-18 DIAGNOSIS — R2689 Other abnormalities of gait and mobility: Secondary | ICD-10-CM | POA: Diagnosis not present

## 2022-02-18 DIAGNOSIS — R6889 Other general symptoms and signs: Secondary | ICD-10-CM | POA: Diagnosis not present

## 2022-02-18 LAB — CBC
HCT: 39.8 % (ref 36.0–46.0)
Hemoglobin: 11.9 g/dL — ABNORMAL LOW (ref 12.0–15.0)
MCH: 27.3 pg (ref 26.0–34.0)
MCHC: 29.9 g/dL — ABNORMAL LOW (ref 30.0–36.0)
MCV: 91.3 fL (ref 80.0–100.0)
Platelets: 197 10*3/uL (ref 150–400)
RBC: 4.36 MIL/uL (ref 3.87–5.11)
RDW: 18.3 % — ABNORMAL HIGH (ref 11.5–15.5)
WBC: 6.5 10*3/uL (ref 4.0–10.5)
nRBC: 0 % (ref 0.0–0.2)

## 2022-02-18 LAB — BASIC METABOLIC PANEL
Anion gap: 5 (ref 5–15)
BUN: 16 mg/dL (ref 8–23)
CO2: 24 mmol/L (ref 22–32)
Calcium: 9.3 mg/dL (ref 8.9–10.3)
Chloride: 107 mmol/L (ref 98–111)
Creatinine, Ser: 0.96 mg/dL (ref 0.44–1.00)
GFR, Estimated: 55 mL/min — ABNORMAL LOW (ref >60)
Glucose, Bld: 105 mg/dL — ABNORMAL HIGH (ref 70–99)
Potassium: 4.4 mmol/L (ref 3.5–5.1)
Sodium: 136 mmol/L (ref 135–145)

## 2022-02-18 LAB — TROPONIN I (HIGH SENSITIVITY): Troponin I (High Sensitivity): 22 ng/L — ABNORMAL HIGH (ref <18)

## 2022-02-18 MED ORDER — ACETAMINOPHEN 500 MG PO TABS
500.0000 mg | ORAL_TABLET | Freq: Once | ORAL | Status: AC
  Administered 2022-02-18: 500 mg via ORAL
  Filled 2022-02-18: qty 1

## 2022-02-18 MED ORDER — LORAZEPAM 1 MG PO TABS
1.0000 mg | ORAL_TABLET | Freq: Once | ORAL | Status: AC
Start: 2022-02-18 — End: 2022-02-18
  Administered 2022-02-18: 1 mg via ORAL
  Filled 2022-02-18: qty 1

## 2022-02-18 NOTE — ED Notes (Signed)
Got patient into a gown on the monitor did ekg shown to er provider patient is resting with call bell in reach got patient warm blanket nurse is at bedside

## 2022-02-18 NOTE — ED Provider Notes (Signed)
[facility] Provider Note   CSN: [id number] Arrival date & time: 02/18/22  1451     History  Chief Complaint  Patient presents with   Shortness of Breath    [name] is a 88 y.o. female with history of colon cancer, stroke (2013), breast cancer, congestive heart failure, GERD, HTN, hypothyroidism, peripheral vascular disease who presents the emergency department complaining of shortness of breath and left arm pain.  Pt alert to person and place. Does not remember being seen in ER two days ago.   Known cognitive decline per daughter Dorothe Pea [phone]). Confirmed patient's responses with daughter and she states this is her baseline for several months.  Lives at [facility] independent living facility.  Level 5 caveat due to dementia   Shortness of Breath Associated symptoms: chest pain        Home Medications Prior to Admission medications   Medication Sig Start Date End Date Taking? Authorizing Provider  acetaminophen (TYLENOL) 500 MG tablet Take 1,000 mg by mouth every 6 (six) hours as needed for headache (pain).    [provider]  amiodarone (PACERONE) 200 MG tablet Take 200 mg by mouth daily at 12 noon. 12/11/21   [provider]  atorvastatin (LIPITOR) 40 MG tablet Take 1 tablet (40 mg total) by mouth daily at 6 PM. Patient taking differently: Take 40 mg by mouth every evening. 09/02/19   Dixie Dials, MD  clopidogrel (PLAVIX) 75 MG tablet Take 1 tablet (75 mg total) by mouth daily. Patient taking differently: Take 75 mg by mouth daily at 12 noon. 09/02/19   Dixie Dials, MD  diclofenac sodium (VOLTAREN) 1 % GEL APPLY 2-4 GRAMS TO AFFECTED AREA 2-3 TIMES A DAY AS NEEDED. Patient taking differently: Apply 1 Application topically 2 (two) times daily as needed (joint pain). 04/30/19   Meredith Pel, MD  esomeprazole (NEXIUM) 40 MG capsule Take 40 mg by mouth every morning.    [provider]  furosemide (LASIX) 40 MG tablet Take 0.5 tablets (20 mg total) by mouth daily. 02/07/22   Domenic Polite, MD  hydrALAZINE (APRESOLINE) 10 MG tablet Take 1 tablet (10 mg total) by mouth 2 (two) times daily. 12/02/21   Dixie Dials, MD  levothyroxine (SYNTHROID) 75 MCG tablet Take 1 tablet (75 mcg total) by mouth daily. Patient taking differently: Take 75 mcg by mouth every morning. 01/03/22   Pokhrel, Corrie Mckusick, MD  Melatonin 3 MG TABS Take 3 mg by mouth at bedtime as needed (sleep).     [provider]  metoprolol tartrate (LOPRESSOR) 25 MG tablet Take 25 mg by mouth See admin instructions. Take one tablet (25 mg) by mouth twice daily - noon and evening 12/11/21   [provider]  nitroGLYCERIN (NITROSTAT) 0.4 MG SL tablet Place 1 tablet (0.4 mg total) under the tongue every 5 (five) minutes x 3 doses as needed for chest pain. 09/02/19   Dixie Dials, MD  Polyethyl Glycol-Propyl Glycol (SYSTANE) 0.4-0.3 % GEL ophthalmic gel Place 1 application into both eyes at bedtime.    [provider]  potassium chloride SA (KLOR-CON M20) 20 MEQ tablet Take 1 tablet (20 mEq total) by mouth every Monday, Wednesday, and Friday. Patient taking differently: Take 20 mEq by mouth daily at 12 noon. 12/03/21   Dixie Dials, MD      Allergies    Codeine and Pregabalin    Review of Systems   Review of Systems  Unable to  perform ROS: Dementia  Respiratory:  Positive for shortness of breath.   Cardiovascular:  Positive for chest pain.    Physical Exam Updated Vital Signs BP (!) 191/91   Pulse 68   Temp 98.2 F (36.8 C) (Oral)   Resp 20   Ht '5\' 7"'$  (1.702 m)   Wt 70 kg   SpO2 99%   BMI 24.17 kg/m  Physical Exam Vitals and nursing note reviewed.  Constitutional:      Appearance: Normal appearance.  HENT:     Head: Normocephalic and atraumatic.  Eyes:     Conjunctiva/sclera: Conjunctivae normal.  Cardiovascular:     Rate and Rhythm: Normal rate and regular rhythm.  Pulmonary:      Effort: Pulmonary effort is normal. No respiratory distress.     Breath sounds: Normal breath sounds.  Chest:     Chest wall: No deformity or tenderness.  Abdominal:     General: There is no distension.     Palpations: Abdomen is soft.     Tenderness: There is no abdominal tenderness.  Skin:    General: Skin is warm and dry.  Neurological:     General: No focal deficit present.     Mental Status: She is alert. She is confused.     Comments: Oriented to person and place. Appears confused     ED Results / Procedures / Treatments   Labs (all labs ordered are listed, but only abnormal results are displayed) Labs Reviewed  BASIC METABOLIC PANEL - Abnormal; Notable for the following components:      Result Value   Glucose, Bld 105 (*)    GFR, Estimated 55 (*)    All other components within normal limits  TROPONIN I (HIGH SENSITIVITY) - Abnormal; Notable for the following components:   Troponin I (High Sensitivity) 22 (*)    All other components within normal limits  CBC  CBC    EKG EKG Interpretation  Date/Time:  Friday February 18 2022 15:01:36 EDT Ventricular Rate:  65 PR Interval:  284 QRS Duration: 163 QT Interval:  469 QTC Calculation: 488 R Axis:   -60 Text Interpretation: Sinus rhythm Prolonged PR interval Left bundle branch block Confirmed by Tretha Sciara [phone]) on 02/18/2022 3:04:30 PM  Radiology DG Chest 2 View  Result Date: 02/18/2022 CLINICAL DATA:  Pt bib ems from [facility] c/o sob and left arm pain. EMS stated pt has a mass on heart and has been dealing with symptoms since June. Pt has a UTI and confirms she is up to date on medications EXAM: CHEST - 2 VIEW COMPARISON:  02/16/2022 FINDINGS: Improved aeration in the lung bases. Old left first and second rib fracture deformity. Heart size and mediastinal contours are within normal limits. Aortic Atherosclerosis (ICD10-170.0). No effusion. Surgical clips right axilla.  Bilateral  shoulder DJD. IMPRESSION: Chronic and postop changes.  No acute findings. Electronically Signed   By: Lucrezia Europe M.D.   On: 02/18/2022 16:35    Procedures Procedures    Medications Ordered in ED Medications  acetaminophen (TYLENOL) tablet 500 mg (500 mg Oral Given 02/18/22 1551)    ED Course/ Medical Decision Making/ A&P                           Medical Decision Making  This patient is a 88 y.o. female  who presents to the ED for concern of shortness of breath.   Past Medical History /  Co-morbidities: colon cancer, stroke (2013), breast cancer, congestive heart failure, GERD, HTN, hypothyroidism, peripheral vascular disease  Additional history: Chart reviewed. Pertinent results include: Patient seen in ER numerous times for similar symptoms, most recently on 8/16.  At that time patient had a relatively normal work-up except for an elevated D-dimer level.  She had a PET scan which was negative.  She does have a known chest mass consistent with primary breast malignancy.  The provider during that visit had contacted the patient's daughter Dorothe Pea, healthcare power of attorney), who advised that the patient does not know about her current cancer diagnosis and does not wish for her to know.  Patient is DNR and the family does not want any aggressive measures.  She was discharged from that visit and plan to follow-up with her primary doctor.  I contacted the patient's daughter who advised that she had not wanted her transported to the emergency department today.  She received a call from the facility that EMS was evaluating the patient for continued complaints of chest pain and shortness of breath.  She hoped to speak to EMS before they transported her, but they did such anyways.  She states to me that the patient's current mental status is her baseline.  They would rather follow-up outpatient with her primary doctor then have her further evaluated or admitted.  Physical Exam: Physical  exam performed. The pertinent findings include: Patient hypertensive to 191/91, there was normal vital signs.  In no acute distress.  Lung sounds clear to auscultation, heart regular rate and rhythm.  No reproducible tenderness palpation of the chest wall.  Lab Tests/Imaging studies: I personally interpreted labs/imaging and the pertinent results include: CBC pending.  BMP unremarkable.  Troponin 22, consistent with several other visits. Chest x-ray with chronic and postop changes, no acute findings.  I reviewed the imaging agree with the radiologist of rotation. I agree with the radiologist interpretation.  Cardiac monitoring: EKG obtained and interpreted by my attending physician which shows: Sinus rhythm with left bundle branch block   Medications: I ordered medication including Tylenol for arthritic pain.  I have reviewed the patients home medicines and have made adjustments as needed.   Disposition: After consideration of the diagnostic results and the patients response to treatment, I feel that emergency department workup does not suggest an emergent condition requiring admission or immediate intervention beyond what has been performed at this time. The plan is: Discharge to facility and follow-up with PCP. The patient is safe for discharge and has been instructed to return immediately for worsening symptoms, change in symptoms or any other concerns.  Plan discussed with daughter who is agreeable.        Final Clinical Impression(s) / ED Diagnoses Final diagnoses:  SOB (shortness of breath)    Rx / DC Orders ED Discharge Orders     None      Portions of this report may have been transcribed using voice recognition software. Every effort was made to ensure accuracy; however, inadvertent computerized transcription errors may be present.    Kateri Plummer, PA-C 02/18/22 1826    Tretha Sciara, MD 02/19/22 0700

## 2022-02-18 NOTE — ED Notes (Signed)
Pt provided bag lunch

## 2022-02-18 NOTE — ED Notes (Signed)
Pt is c/o right knee pain that is unbearable. RN notified EDP

## 2022-02-18 NOTE — ED Notes (Signed)
[facility] called to transport patient  

## 2022-02-18 NOTE — Discharge Instructions (Signed)
[name] was seen in the emergency department today for chest discomfort and shortness of breath.  Her lab work looked similar to her previous visit, and I have no acute concerns.  I spoke with her daughter who is comfortable with the plan and will take her to follow-up with her primary doctor.

## 2022-02-18 NOTE — ED Notes (Signed)
Pt provided water.  

## 2022-02-18 NOTE — ED Notes (Signed)
Xray notified pt is ready

## 2022-02-18 NOTE — Telephone Encounter (Signed)
        Patient  visited [facility] on 02/16/2022  for chest pain   Telephone encounter attempt :  Eustace Moore can not leave a message   [facility], Care Management  [phone] [address], [place], [place] [address] Phone: [phone] Email: [email or web address]

## 2022-02-18 NOTE — ED Triage Notes (Signed)
Pt bib ems from [facility] c/o sob and left arm pain. EMS stated pt has a mass on heart and has been dealing with symptoms since June. Pt has a UTI and confirms she is up to date on medications.   BP 160/100 Cbg 109 RA 100%  EKG left bundle branch per ems

## 2022-02-18 NOTE — ED Notes (Signed)
Pt states her knee pain is chronic and usually take what ever the doctor gives for relief.

## 2022-02-21 DIAGNOSIS — R488 Other symbolic dysfunctions: Secondary | ICD-10-CM | POA: Diagnosis not present

## 2022-02-21 DIAGNOSIS — M25512 Pain in left shoulder: Secondary | ICD-10-CM | POA: Diagnosis not present

## 2022-02-21 DIAGNOSIS — M6281 Muscle weakness (generalized): Secondary | ICD-10-CM | POA: Diagnosis not present

## 2022-02-21 DIAGNOSIS — R2689 Other abnormalities of gait and mobility: Secondary | ICD-10-CM | POA: Diagnosis not present

## 2022-02-21 DIAGNOSIS — R41841 Cognitive communication deficit: Secondary | ICD-10-CM | POA: Diagnosis not present

## 2022-02-22 DIAGNOSIS — M25512 Pain in left shoulder: Secondary | ICD-10-CM | POA: Diagnosis not present

## 2022-02-22 DIAGNOSIS — R488 Other symbolic dysfunctions: Secondary | ICD-10-CM | POA: Diagnosis not present

## 2022-02-22 DIAGNOSIS — M6281 Muscle weakness (generalized): Secondary | ICD-10-CM | POA: Diagnosis not present

## 2022-02-22 DIAGNOSIS — R2689 Other abnormalities of gait and mobility: Secondary | ICD-10-CM | POA: Diagnosis not present

## 2022-02-22 DIAGNOSIS — R41841 Cognitive communication deficit: Secondary | ICD-10-CM | POA: Diagnosis not present

## 2022-02-23 DIAGNOSIS — R488 Other symbolic dysfunctions: Secondary | ICD-10-CM | POA: Diagnosis not present

## 2022-02-23 DIAGNOSIS — R41841 Cognitive communication deficit: Secondary | ICD-10-CM | POA: Diagnosis not present

## 2022-02-23 DIAGNOSIS — M25512 Pain in left shoulder: Secondary | ICD-10-CM | POA: Diagnosis not present

## 2022-02-23 DIAGNOSIS — R457 State of emotional shock and stress, unspecified: Secondary | ICD-10-CM | POA: Diagnosis not present

## 2022-02-23 DIAGNOSIS — M6281 Muscle weakness (generalized): Secondary | ICD-10-CM | POA: Diagnosis not present

## 2022-02-23 DIAGNOSIS — R2689 Other abnormalities of gait and mobility: Secondary | ICD-10-CM | POA: Diagnosis not present

## 2022-02-23 DIAGNOSIS — R0689 Other abnormalities of breathing: Secondary | ICD-10-CM | POA: Diagnosis not present

## 2022-02-23 DIAGNOSIS — Z743 Need for continuous supervision: Secondary | ICD-10-CM | POA: Diagnosis not present

## 2022-02-23 DIAGNOSIS — R069 Unspecified abnormalities of breathing: Secondary | ICD-10-CM | POA: Diagnosis not present

## 2022-02-24 ENCOUNTER — Telehealth: Payer: Self-pay

## 2022-02-24 ENCOUNTER — Other Ambulatory Visit: Payer: Self-pay

## 2022-02-24 ENCOUNTER — Emergency Department (HOSPITAL_COMMUNITY): Payer: Medicare Other

## 2022-02-24 ENCOUNTER — Emergency Department (HOSPITAL_COMMUNITY)
Admission: EM | Admit: 2022-02-24 | Discharge: 2022-02-25 | Disposition: A | Discharge status: Nursing Home (Includes ICF) | Payer: Medicare Other | Attending: Emergency Medicine | Admitting: Emergency Medicine

## 2022-02-24 ENCOUNTER — Encounter (HOSPITAL_COMMUNITY): Payer: Self-pay | Admitting: Emergency Medicine

## 2022-02-24 DIAGNOSIS — I499 Cardiac arrhythmia, unspecified: Secondary | ICD-10-CM | POA: Diagnosis not present

## 2022-02-24 DIAGNOSIS — Z79899 Other long term (current) drug therapy: Secondary | ICD-10-CM | POA: Insufficient documentation

## 2022-02-24 DIAGNOSIS — M25512 Pain in left shoulder: Secondary | ICD-10-CM | POA: Diagnosis not present

## 2022-02-24 DIAGNOSIS — Z853 Personal history of malignant neoplasm of breast: Secondary | ICD-10-CM | POA: Diagnosis not present

## 2022-02-24 DIAGNOSIS — I110 Hypertensive heart disease with heart failure: Secondary | ICD-10-CM | POA: Insufficient documentation

## 2022-02-24 DIAGNOSIS — E039 Hypothyroidism, unspecified: Secondary | ICD-10-CM | POA: Insufficient documentation

## 2022-02-24 DIAGNOSIS — Z96642 Presence of left artificial hip joint: Secondary | ICD-10-CM | POA: Diagnosis not present

## 2022-02-24 DIAGNOSIS — I509 Heart failure, unspecified: Secondary | ICD-10-CM | POA: Insufficient documentation

## 2022-02-24 DIAGNOSIS — R0789 Other chest pain: Secondary | ICD-10-CM

## 2022-02-24 DIAGNOSIS — R2689 Other abnormalities of gait and mobility: Secondary | ICD-10-CM | POA: Diagnosis not present

## 2022-02-24 DIAGNOSIS — R6889 Other general symptoms and signs: Secondary | ICD-10-CM | POA: Diagnosis not present

## 2022-02-24 DIAGNOSIS — J9811 Atelectasis: Secondary | ICD-10-CM | POA: Diagnosis not present

## 2022-02-24 DIAGNOSIS — R079 Chest pain, unspecified: Secondary | ICD-10-CM | POA: Diagnosis not present

## 2022-02-24 DIAGNOSIS — Z85038 Personal history of other malignant neoplasm of large intestine: Secondary | ICD-10-CM | POA: Insufficient documentation

## 2022-02-24 DIAGNOSIS — M6281 Muscle weakness (generalized): Secondary | ICD-10-CM | POA: Diagnosis not present

## 2022-02-24 DIAGNOSIS — R1013 Epigastric pain: Secondary | ICD-10-CM | POA: Diagnosis not present

## 2022-02-24 DIAGNOSIS — Z743 Need for continuous supervision: Secondary | ICD-10-CM | POA: Diagnosis not present

## 2022-02-24 DIAGNOSIS — J811 Chronic pulmonary edema: Secondary | ICD-10-CM | POA: Diagnosis not present

## 2022-02-24 DIAGNOSIS — R41841 Cognitive communication deficit: Secondary | ICD-10-CM | POA: Diagnosis not present

## 2022-02-24 DIAGNOSIS — R488 Other symbolic dysfunctions: Secondary | ICD-10-CM | POA: Diagnosis not present

## 2022-02-24 DIAGNOSIS — I454 Nonspecific intraventricular block: Secondary | ICD-10-CM | POA: Diagnosis not present

## 2022-02-24 LAB — BASIC METABOLIC PANEL
Anion gap: 8 (ref 5–15)
BUN: 12 mg/dL (ref 8–23)
CO2: 21 mmol/L — ABNORMAL LOW (ref 22–32)
Calcium: 9.3 mg/dL (ref 8.9–10.3)
Chloride: 106 mmol/L (ref 98–111)
Creatinine, Ser: 0.97 mg/dL (ref 0.44–1.00)
GFR, Estimated: 54 mL/min — ABNORMAL LOW (ref >60)
Glucose, Bld: 99 mg/dL (ref 70–99)
Potassium: 4.3 mmol/L (ref 3.5–5.1)
Sodium: 135 mmol/L (ref 135–145)

## 2022-02-24 LAB — CBC
HCT: 37.7 % (ref 36.0–46.0)
Hemoglobin: 11.7 g/dL — ABNORMAL LOW (ref 12.0–15.0)
MCH: 26.7 pg (ref 26.0–34.0)
MCHC: 31 g/dL (ref 30.0–36.0)
MCV: 86.1 fL (ref 80.0–100.0)
Platelets: 222 10*3/uL (ref 150–400)
RBC: 4.38 MIL/uL (ref 3.87–5.11)
RDW: 17.5 % — ABNORMAL HIGH (ref 11.5–15.5)
WBC: 6.9 10*3/uL (ref 4.0–10.5)
nRBC: 0 % (ref 0.0–0.2)

## 2022-02-24 LAB — HEPATIC FUNCTION PANEL
ALT: 13 U/L (ref 0–44)
AST: 19 U/L (ref 15–41)
Albumin: 3.5 g/dL (ref 3.5–5.0)
Alkaline Phosphatase: 46 U/L (ref 38–126)
Bilirubin, Direct: 0.1 mg/dL (ref 0.0–0.2)
Indirect Bilirubin: 0.5 mg/dL (ref 0.3–0.9)
Total Bilirubin: 0.6 mg/dL (ref 0.3–1.2)
Total Protein: 6.1 g/dL — ABNORMAL LOW (ref 6.5–8.1)

## 2022-02-24 LAB — TROPONIN I (HIGH SENSITIVITY)
Troponin I (High Sensitivity): 19 ng/L — ABNORMAL HIGH (ref <18)
Troponin I (High Sensitivity): 20 ng/L — ABNORMAL HIGH (ref <18)

## 2022-02-24 LAB — LIPASE, BLOOD: Lipase: 27 U/L (ref 11–51)

## 2022-02-24 MED ORDER — HYDRALAZINE HCL 10 MG PO TABS
10.0000 mg | ORAL_TABLET | Freq: Once | ORAL | Status: AC
  Administered 2022-02-25: 10 mg via ORAL
  Filled 2022-02-24: qty 1

## 2022-02-24 NOTE — Telephone Encounter (Signed)
        Patient  visited [facility]  on 8/18  for SOB   Telephone encounter attempt :  1  A HIPAA compliant voice message was left requesting a return call.  Instructed patient to call back at earliest convenience. [facility], Care Management  [phone] [address], [place], [place] [address] Phone: [phone] Email: [email or web address]

## 2022-02-24 NOTE — ED Notes (Signed)
Pt given food per Pearline Cables MD

## 2022-02-24 NOTE — ED Triage Notes (Signed)
Pt bib [facility] from [facility] facility c/o central C epigastric CP radiating to the Lt arm that started last night and got worse today. Pt also c/o shoulder pain. Denies n/v and SOB. Hx CHF. Took '324mg'$  asa and 0.'8mg'$  nitro.   EMS vitals 122/56 BP 73 HR 95 O2
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
## 2022-02-24 NOTE — ED Notes (Signed)
This RN updated pt's daughter Ivin Booty) about pt's care.

## 2022-02-25 DIAGNOSIS — M25512 Pain in left shoulder: Secondary | ICD-10-CM | POA: Diagnosis not present

## 2022-02-25 DIAGNOSIS — R0789 Other chest pain: Secondary | ICD-10-CM | POA: Diagnosis not present

## 2022-02-25 DIAGNOSIS — R079 Chest pain, unspecified: Secondary | ICD-10-CM | POA: Diagnosis not present

## 2022-02-25 DIAGNOSIS — Z7401 Bed confinement status: Secondary | ICD-10-CM | POA: Diagnosis not present

## 2022-02-25 DIAGNOSIS — M6281 Muscle weakness (generalized): Secondary | ICD-10-CM | POA: Diagnosis not present

## 2022-02-25 DIAGNOSIS — R41841 Cognitive communication deficit: Secondary | ICD-10-CM | POA: Diagnosis not present

## 2022-02-25 DIAGNOSIS — R6889 Other general symptoms and signs: Secondary | ICD-10-CM | POA: Diagnosis not present

## 2022-02-25 DIAGNOSIS — R488 Other symbolic dysfunctions: Secondary | ICD-10-CM | POA: Diagnosis not present

## 2022-02-25 DIAGNOSIS — R2689 Other abnormalities of gait and mobility: Secondary | ICD-10-CM | POA: Diagnosis not present

## 2022-02-25 DIAGNOSIS — Z743 Need for continuous supervision: Secondary | ICD-10-CM | POA: Diagnosis not present

## 2022-02-25 LAB — BRAIN NATRIURETIC PEPTIDE: B Natriuretic Peptide: 558.9 pg/mL — ABNORMAL HIGH (ref 0.0–100.0)

## 2022-02-25 MED ORDER — SUCRALFATE 1 G PO TABS: 1.0000 g | ORAL_TABLET | Freq: Three times a day (TID) | ORAL | 0 refills | Status: DC

## 2022-02-25 NOTE — Discharge Instructions (Signed)
It was a pleasure caring for you today in the emergency department. ° °Please return to the emergency department for any worsening or worrisome symptoms. ° ° °

## 2022-02-25 NOTE — ED Notes (Signed)
[facility] notified about pt being transferred back via [place]. Daughter, Ivin Booty updated about pt's discharge.

## 2022-02-25 NOTE — ED Notes (Signed)
Patient verbalizes understanding of d/c instructions. Opportunities for questions and answers were provided. Pt d/c from ED and transferred back to [facility] via [facility].

## 2022-02-26 ENCOUNTER — Emergency Department (HOSPITAL_COMMUNITY): Payer: Medicare Other

## 2022-02-26 ENCOUNTER — Emergency Department (HOSPITAL_COMMUNITY)
Admission: EM | Admit: 2022-02-26 | Discharge: 2022-02-27 | Disposition: A | Discharge status: Home / Self Care | Payer: Medicare Other | Attending: Emergency Medicine | Admitting: Emergency Medicine

## 2022-02-26 DIAGNOSIS — N3000 Acute cystitis without hematuria: Secondary | ICD-10-CM | POA: Diagnosis not present

## 2022-02-26 DIAGNOSIS — Z20822 Contact with and (suspected) exposure to covid-19: Secondary | ICD-10-CM | POA: Diagnosis not present

## 2022-02-26 DIAGNOSIS — Z85038 Personal history of other malignant neoplasm of large intestine: Secondary | ICD-10-CM | POA: Insufficient documentation

## 2022-02-26 DIAGNOSIS — Z7902 Long term (current) use of antithrombotics/antiplatelets: Secondary | ICD-10-CM | POA: Diagnosis not present

## 2022-02-26 DIAGNOSIS — I110 Hypertensive heart disease with heart failure: Secondary | ICD-10-CM | POA: Diagnosis not present

## 2022-02-26 DIAGNOSIS — Z743 Need for continuous supervision: Secondary | ICD-10-CM | POA: Diagnosis not present

## 2022-02-26 DIAGNOSIS — F0390 Unspecified dementia without behavioral disturbance: Secondary | ICD-10-CM | POA: Diagnosis not present

## 2022-02-26 DIAGNOSIS — R0602 Shortness of breath: Secondary | ICD-10-CM | POA: Diagnosis not present

## 2022-02-26 DIAGNOSIS — Z85048 Personal history of other malignant neoplasm of rectum, rectosigmoid junction, and anus: Secondary | ICD-10-CM | POA: Insufficient documentation

## 2022-02-26 DIAGNOSIS — I509 Heart failure, unspecified: Secondary | ICD-10-CM | POA: Insufficient documentation

## 2022-02-26 DIAGNOSIS — I440 Atrioventricular block, first degree: Secondary | ICD-10-CM | POA: Diagnosis not present

## 2022-02-26 DIAGNOSIS — R079 Chest pain, unspecified: Secondary | ICD-10-CM | POA: Diagnosis not present

## 2022-02-26 DIAGNOSIS — R0789 Other chest pain: Secondary | ICD-10-CM | POA: Diagnosis not present

## 2022-02-26 DIAGNOSIS — R6889 Other general symptoms and signs: Secondary | ICD-10-CM | POA: Diagnosis not present

## 2022-02-26 DIAGNOSIS — Z8673 Personal history of transient ischemic attack (TIA), and cerebral infarction without residual deficits: Secondary | ICD-10-CM | POA: Diagnosis not present

## 2022-02-26 DIAGNOSIS — I499 Cardiac arrhythmia, unspecified: Secondary | ICD-10-CM | POA: Diagnosis not present

## 2022-02-26 DIAGNOSIS — Z853 Personal history of malignant neoplasm of breast: Secondary | ICD-10-CM | POA: Insufficient documentation

## 2022-02-26 LAB — CBC WITH DIFFERENTIAL/PLATELET
Abs Immature Granulocytes: 0.03 10*3/uL (ref 0.00–0.07)
Basophils Absolute: 0 10*3/uL (ref 0.0–0.1)
Basophils Relative: 1 %
Eosinophils Absolute: 0.1 10*3/uL (ref 0.0–0.5)
Eosinophils Relative: 1 %
HCT: 40.1 % (ref 36.0–46.0)
Hemoglobin: 12.1 g/dL (ref 12.0–15.0)
Immature Granulocytes: 1 %
Lymphocytes Relative: 32 %
Lymphs Abs: 2 10*3/uL (ref 0.7–4.0)
MCH: 26.4 pg (ref 26.0–34.0)
MCHC: 30.2 g/dL (ref 30.0–36.0)
MCV: 87.6 fL (ref 80.0–100.0)
Monocytes Absolute: 0.9 10*3/uL (ref 0.1–1.0)
Monocytes Relative: 14 %
Neutro Abs: 3.3 10*3/uL (ref 1.7–7.7)
Neutrophils Relative %: 51 %
Platelets: 251 10*3/uL (ref 150–400)
RBC: 4.58 MIL/uL (ref 3.87–5.11)
RDW: 17.6 % — ABNORMAL HIGH (ref 11.5–15.5)
WBC: 6.2 10*3/uL (ref 4.0–10.5)
nRBC: 0 % (ref 0.0–0.2)

## 2022-02-26 LAB — COMPREHENSIVE METABOLIC PANEL
ALT: 13 U/L (ref 0–44)
AST: 21 U/L (ref 15–41)
Albumin: 3.8 g/dL (ref 3.5–5.0)
Alkaline Phosphatase: 52 U/L (ref 38–126)
Anion gap: 8 (ref 5–15)
BUN: 15 mg/dL (ref 8–23)
CO2: 23 mmol/L (ref 22–32)
Calcium: 9.6 mg/dL (ref 8.9–10.3)
Chloride: 106 mmol/L (ref 98–111)
Creatinine, Ser: 1.07 mg/dL — ABNORMAL HIGH (ref 0.44–1.00)
GFR, Estimated: 48 mL/min — ABNORMAL LOW (ref >60)
Glucose, Bld: 101 mg/dL — ABNORMAL HIGH (ref 70–99)
Potassium: 4.3 mmol/L (ref 3.5–5.1)
Sodium: 137 mmol/L (ref 135–145)
Total Bilirubin: 0.5 mg/dL (ref 0.3–1.2)
Total Protein: 6.6 g/dL (ref 6.5–8.1)

## 2022-02-26 LAB — URINALYSIS, ROUTINE W REFLEX MICROSCOPIC
Bilirubin Urine: NEGATIVE
Glucose, UA: NEGATIVE mg/dL
Hgb urine dipstick: NEGATIVE
Ketones, ur: NEGATIVE mg/dL
Nitrite: NEGATIVE
Protein, ur: NEGATIVE mg/dL
Specific Gravity, Urine: 1.011 (ref 1.005–1.030)
pH: 7 (ref 5.0–8.0)

## 2022-02-26 LAB — RESP PANEL BY RT-PCR (FLU A&B, COVID) ARPGX2
Influenza A by PCR: NEGATIVE
Influenza B by PCR: NEGATIVE
SARS Coronavirus 2 by RT PCR: NEGATIVE

## 2022-02-26 LAB — TROPONIN I (HIGH SENSITIVITY)
Troponin I (High Sensitivity): 22 ng/L — ABNORMAL HIGH (ref <18)
Troponin I (High Sensitivity): 20 ng/L — ABNORMAL HIGH (ref <18)

## 2022-02-26 LAB — BRAIN NATRIURETIC PEPTIDE: B Natriuretic Peptide: 566.8 pg/mL — ABNORMAL HIGH (ref 0.0–100.0)

## 2022-02-26 MED ORDER — CEPHALEXIN 250 MG PO CAPS
500.0000 mg | ORAL_CAPSULE | Freq: Once | ORAL | Status: AC
  Administered 2022-02-27: 500 mg via ORAL
  Filled 2022-02-26: qty 2

## 2022-02-26 MED ORDER — HYDRALAZINE HCL 10 MG PO TABS
10.0000 mg | ORAL_TABLET | Freq: Two times a day (BID) | ORAL | Status: DC
  Administered 2022-02-27: 10 mg via ORAL
  Filled 2022-02-26: qty 1

## 2022-02-26 MED ORDER — METOPROLOL TARTRATE 25 MG PO TABS
25.0000 mg | ORAL_TABLET | Freq: Two times a day (BID) | ORAL | Status: DC
  Administered 2022-02-27: 25 mg via ORAL
  Filled 2022-02-26: qty 1

## 2022-02-26 MED ORDER — METOPROLOL TARTRATE 25 MG PO TABS: 25.0000 mg | ORAL_TABLET | ORAL | Status: DC

## 2022-02-26 MED ORDER — CEPHALEXIN 500 MG PO CAPS: 500.0000 mg | ORAL_CAPSULE | Freq: Three times a day (TID) | ORAL | 0 refills | Status: DC

## 2022-02-26 NOTE — ED Notes (Signed)
This RN called pt's daughter Ivin Booty) about discharge and pt being transferred back to [facility] once [place] arrives.

## 2022-02-26 NOTE — ED Provider Notes (Signed)
[facility] EMERGENCY DEPARTMENT Provider Note   CSN: [id number] Arrival date & time: 02/26/22  1636     History  Chief Complaint  Patient presents with   Chest Pain    [name] is a 88 y.o. female.  With extensive past medical history including dementia, diverticulosis, gastritis, chronic bronchitis, CHF, stroke, rectal cancer, colon cancer, breast cancer with multiple recent ED visits who presents to the ED via EMS for evaluation of chest pain, shortness of breath, and abdominal pain.  She reports symptoms started this morning when she woke up.  When asked to describe the pain she states "it's just pain" in her chest, and feeling bloated in her abdomen.  Reports shortness of breath is unchanged at this time from her baseline.  States she has not eaten since lunch and is hungry and thirsty.  Pain does not radiate.  No nausea, vomiting, diarrhea, headaches, vision changes, numbness, weakness, tingling.  Patient kept repeating "I was in [place] for 8 years "and "I just want you to put me to sleep."   Spoke with patient's daughter, Dorothe Pea [phone]), who is her [place].  Daughter states that current plan is still supportive measures, no aggressive treatment or hospital admission.  Patient is still DNR.  Daughter confirmed patient is disoriented at baseline, has chest pain and shortness of breath nearly daily.  Daughter states that they are being set up for hospice or palliative care and have an appointment coming up.  Chest Pain      Home Medications Prior to Admission medications   Medication Sig Start Date End Date Taking? Authorizing Provider  acetaminophen (TYLENOL) 500 MG tablet Take 1,000 mg by mouth every 6 (six) hours as needed for headache (pain).    [provider]  amiodarone (PACERONE) 200 MG tablet Take 200 mg by mouth daily at 12 noon. 12/11/21   [provider]  atorvastatin (LIPITOR) 40 MG tablet Take  1 tablet (40 mg total) by mouth daily at 6 PM. Patient taking differently: Take 40 mg by mouth every evening. 09/02/19   Dixie Dials, MD  clopidogrel (PLAVIX) 75 MG tablet Take 1 tablet (75 mg total) by mouth daily. Patient taking differently: Take 75 mg by mouth daily at 12 noon. 09/02/19   Dixie Dials, MD  diclofenac sodium (VOLTAREN) 1 % GEL APPLY 2-4 GRAMS TO AFFECTED AREA 2-3 TIMES A DAY AS NEEDED. Patient taking differently: Apply 1 Application topically 2 (two) times daily as needed (joint pain). 04/30/19   Meredith Pel, MD  esomeprazole (NEXIUM) 40 MG capsule Take 40 mg by mouth every morning.    [provider]  furosemide (LASIX) 40 MG tablet Take 0.5 tablets (20 mg total) by mouth daily. 02/07/22   Domenic Polite, MD  hydrALAZINE (APRESOLINE) 10 MG tablet Take 1 tablet (10 mg total) by mouth 2 (two) times daily. 12/02/21   Dixie Dials, MD  levothyroxine (SYNTHROID) 75 MCG tablet Take 1 tablet (75 mcg total) by mouth daily. Patient taking differently: Take 75 mcg by mouth every morning. 01/03/22   Pokhrel, Corrie Mckusick, MD  Melatonin 3 MG TABS Take 3 mg by mouth at bedtime as needed (sleep).     [provider]  metoprolol tartrate (LOPRESSOR) 25 MG tablet Take 25 mg by mouth See admin instructions. Take one tablet (25 mg) by mouth twice daily - noon and evening 12/11/21   [provider]  nitroGLYCERIN (NITROSTAT) 0.4 MG SL tablet Place 1 tablet (  0.4 mg total) under the tongue every 5 (five) minutes x 3 doses as needed for chest pain. 09/02/19   Dixie Dials, MD  Polyethyl Glycol-Propyl Glycol (SYSTANE) 0.4-0.3 % GEL ophthalmic gel Place 1 application into both eyes at bedtime.    [provider]  potassium chloride SA (KLOR-CON M20) 20 MEQ tablet Take 1 tablet (20 mEq total) by mouth every Monday, Wednesday, and Friday. Patient taking differently: Take 20 mEq by mouth daily at 12 noon. 12/03/21   Dixie Dials, MD  sucralfate (CARAFATE) 1 g tablet Take 1  tablet (1 g total) by mouth in the morning, at noon, and at bedtime for 7 doses. 02/25/22 02/28/22  Jeanell Sparrow, DO      Allergies    Codeine and Pregabalin    Review of Systems   Review of Systems  Cardiovascular:  Positive for chest pain.    Physical Exam Updated Vital Signs BP (!) 153/71 (BP Location: Right Arm)   Pulse (!) 59   Temp (!) 97.4 F (36.3 C)   Resp 18   SpO2 100%  Physical Exam  ED Results / Procedures / Treatments   Labs (all labs ordered are listed, but only abnormal results are displayed) Labs Reviewed  COMPREHENSIVE METABOLIC PANEL - Abnormal; Notable for the following components:      Result Value   Glucose, Bld 101 (*)    Creatinine, Ser 1.07 (*)    GFR, Estimated 48 (*)    All other components within normal limits  CBC WITH DIFFERENTIAL/PLATELET - Abnormal; Notable for the following components:   RDW 17.6 (*)    All other components within normal limits  BRAIN NATRIURETIC PEPTIDE - Abnormal; Notable for the following components:   B Natriuretic Peptide 566.8 (*)    All other components within normal limits  TROPONIN I (HIGH SENSITIVITY) - Abnormal; Notable for the following components:   Troponin I (High Sensitivity) 22 (*)    All other components within normal limits  RESP PANEL BY RT-PCR (FLU A&B, COVID) ARPGX2  URINALYSIS, ROUTINE W REFLEX MICROSCOPIC  TROPONIN I (HIGH SENSITIVITY)    EKG EKG Interpretation  Date/Time:  Saturday February 26 2022 17:12:26 EDT Ventricular Rate:  66 PR Interval:  226 QRS Duration: 172 QT Interval:  472 QTC Calculation: 494 R Axis:   -65 Text Interpretation: Sinus rhythm with 1st degree A-V block Left axis deviation Left bundle branch block Abnormal ECG When compared with ECG of 24-Feb-2022 15:31, PREVIOUS ECG IS PRESENT Confirmed by Noemi Chapel [phone]) on 02/26/2022 7:17:24 PM  Radiology DG Chest 2 View  Result Date: 02/26/2022 CLINICAL DATA:  Shortness of breath EXAM: CHEST - 2 VIEW COMPARISON:   02/24/2022 FINDINGS: Lungs are clear.  No pleural effusion or pneumothorax. The heart is top-normal in size.  Thoracic aortic atherosclerosis. Degenerative changes of the left shoulder. Surgical clips along the right lateral chest wall/axilla. Mild degenerative changes of the visualized thoracolumbar spine. IMPRESSION: No evidence of acute cardiopulmonary disease. Electronically Signed   By: Julian Hy M.D.   On: 02/26/2022 18:34    Procedures Procedures    Medications Ordered in ED Medications - No data to display  ED Course/ Medical Decision Making/ A&P Clinical Course as of 02/26/22 2316  Sat Feb 26, 2022  1946 DG Chest 2 View I personally reviewed and interpreted the x-ray.  No acute cardiopulmonary abnormality. [AS]  2311 Urinalysis, Routine w reflex microscopic(!) Urinalysis shows UTI [AS]  2315 Daughter Ivin Booty was called and updated on  status.  We will treat patient for UTI with Keflex three times daily.  Daughter wants prescription sent [facility] and states that she will have them deliver it to her skilled nursing facility as soon as possible. [AS]    Clinical Course User Index [AS] Claudie Fisherman, [place] Folk, PA-C   This patient presents to the ED for concern of chest pain, shortness of breath, abdominal pain, this involves an extensive number of treatment options, and is a complaint that carries with it a high risk of complications and morbidity.  The emergent differential diagnosis of chest pain includes: Acute coronary syndrome, pericarditis, aortic dissection, pulmonary embolism, tension pneumothorax, and esophageal rupture.  I do not believe the patient has an emergent cause of chest pain, other urgent/non-acute considerations include, but are not limited to: chronic angina, aortic stenosis, cardiomyopathy, myocarditis, mitral valve prolapse, pulmonary hypertension, hypertrophic obstructive cardiomyopathy (HOCM), aortic insufficiency, right ventricular hypertrophy,  pneumonia, pleuritis, bronchitis, pneumothorax, tumor, gastroesophageal reflux disease (GERD), esophageal spasm, Mallory-Weiss syndrome, peptic ulcer disease, biliary disease, pancreatitis, functional gastrointestinal pain, cervical or thoracic disk disease or arthritis, shoulder arthritis, costochondritis, subacromial bursitis, anxiety or panic attack, herpes zoster, breast disorders, chest wall tumors, thoracic outlet syndrome, mediastinitis.    Co morbidities that complicate the patient evaluation  Breast malignancy, dementia, diverticulosis, gastritis, chronic bronchitis, CHF, stroke, history of rectal and colon cancer  My initial workup includes CBC, CMP, BNP, troponin and delta troponin, respiratory panel, urinalysis, chest x-ray, EKG to assess for ACS, or other cardiopulmonary abnormalities, metabolic abnormalities  Additional history obtained from: Nursing notes from this visit. Prior ED visit on 01/25/2022 02/04/2022 02/16/2022 02/18/2022 02/24/2022 all for similar complaints Family daughter was contacted via phone call and provides a portion of the history EMS who brought the patient and provides a portion of the history  I ordered, reviewed and interpreted labs which include:CBC, CMP, BNP, troponin and delta troponin, respiratory panel, urinalysis.  All labs near patient's baseline.  Urinalysis shows large leukocytes, white blood cells, bacteria.  Positive for UTI.   I ordered imaging studies including 2 view chest x-ray I independently visualized and interpreted imaging which showed no acute cardiopulmonary abnormality I agree with the radiologist interpretation  Cardiac Monitoring:  The patient was maintained on a cardiac monitor.  I personally viewed and interpreted the cardiac monitored which showed an underlying rhythm of: Sinus with a left bundle branch block  Afebrile, hemodynamically stable at baseline.  Patient has had multiple ED visits over the past 2 months for very similar  complaints.  Work-up including labs with BNP and delta troponin largely unremarkable and at patient's baseline.  Chest x-ray unremarkable.  Urinalysis shows UTI.  Daughter was called and shared decision making revealed that they do not wish for patient to be admitted to the hospital or undergo intensive treatment.  Confirmed patient is a DNR.  We will treat patient with her nightly antihypertensive medication here in the ED as well as her first dose of Keflex.  And discharge to her skilled nursing facility.  Keflex 3 times daily sent to the pharmacy.  Daughter states that she will have them deliver this to the patient as soon as possible.  At this time there does not appear to be any evidence of an acute emergency medical condition and the patient appears stable for discharge with appropriate outpatient follow up. Diagnosis was discussed with patient who verbalizes understanding of care plan and is agreeable to discharge. I have discussed return precautions with patient and  daughter who verbalizes understanding. Patient encouraged to follow-up with their PCP within 1 week. All questions answered.  Patient's case discussed with Dr. Sabra Heck who agrees with plan to discharge with follow-up.   Note: Portions of this report may have been transcribed using voice recognition software. Every effort was made to ensure accuracy; however, inadvertent computerized transcription errors may still be present.                         Medical Decision Making Amount and/or Complexity of Data Reviewed Labs: ordered. Radiology:  Decision-making details documented in ED Course.          Final Clinical Impression(s) / ED Diagnoses Final diagnoses:  None    Rx / DC Orders ED Discharge Orders     None         Nehemiah Massed 02/26/22 2326    Noemi Chapel, MD 02/27/22 2112

## 2022-02-26 NOTE — ED Triage Notes (Signed)
Patient bib [facility] from [facility] w/ complaints of chest pain that started today, patient w/ complaints of SHOB. Pt during transport started endorsing lump in her throat. 324 ASA given PTA.  VS- BP176/102 HR 80 SpO2 97%

## 2022-02-26 NOTE — Discharge Instructions (Addendum)
You have been seen today for your complaint of chest pain since this morning, persistent shortness of breath, abdominal pain since noon. Your lab work was reassuring and showed no abnormalities from a baseline. Your imaging was reassuring and showed no abnormalities. You were given your home doses of metoprolol and hydralazine here in the ED prior to discharge. Follow up with: Your primary care provider soon as possible for ED visit follow-up Please seek immediate medical care if you develop any of the following symptoms: Your chest pain gets worse. You have a cough that gets worse, or you cough up blood. You have severe pain in your abdomen. You faint. You have sudden, unexplained chest discomfort. You have sudden, unexplained discomfort in your arms, back, neck, or jaw. You have shortness of breath at any time. You suddenly start to sweat, or your skin gets clammy. You feel nausea or you vomit. You suddenly feel lightheaded or dizzy. You have severe weakness, or unexplained weakness or fatigue. Your heart begins to beat quickly, or it feels like it is skipping beats. At this time there does not appear to be the presence of an emergent medical condition, however there is always the potential for conditions to change. Please read and follow the below instructions.  Do not take your medicine if  develop an itchy rash, swelling in your mouth or lips, or difficulty breathing; call 911 and seek immediate emergency medical attention if this occurs.  You may review your lab tests and imaging results in their entirety on your MyChart account.  Please discuss all results of fully with your primary care provider and other specialist at your follow-up visit.  Note: Portions of this text may have been transcribed using voice recognition software. Every effort was made to ensure accuracy; however, inadvertent computerized transcription errors may still be present.

## 2022-02-26 NOTE — ED Provider Triage Note (Signed)
Emergency Medicine Provider Triage Evaluation Note  [name] , a 88 y.o. female  was evaluated in triage.  Pt complains of chest pain and shortness of breath.  Patient states that symptoms began this morning.  She states that she feels significantly short of breath.  Has had only minor intermittent dry cough.  She states that she is having chest pain but then she points to her abdomen.  She denies palpitations or fevers.  Review of Systems  Positive:  Negative:   Physical Exam  BP (!) 152/74 (BP Location: Right Arm)   Pulse 61   Temp 98.4 F (36.9 C) (Oral)   Resp 18   SpO2 98%  Gen:   Awake, no distress   Resp:  Tachypnea, decreased breath sounds in the bases MSK:   Moves extremities without difficulty Other:    Medical Decision Making  Medically screening exam initiated at 5:05 PM.  Appropriate orders placed.  [name] was informed that the remainder of the evaluation will be completed by another provider, this initial triage assessment does not replace that evaluation, and the importance of remaining in the ED until their evaluation is complete.     Mickie Hillier, PA-C 02/26/22 1708

## 2022-02-27 DIAGNOSIS — Z7401 Bed confinement status: Secondary | ICD-10-CM | POA: Diagnosis not present

## 2022-02-27 DIAGNOSIS — N3000 Acute cystitis without hematuria: Secondary | ICD-10-CM | POA: Diagnosis not present

## 2022-02-27 DIAGNOSIS — Z743 Need for continuous supervision: Secondary | ICD-10-CM | POA: Diagnosis not present

## 2022-02-27 DIAGNOSIS — R079 Chest pain, unspecified: Secondary | ICD-10-CM | POA: Diagnosis not present

## 2022-02-27 DIAGNOSIS — R6889 Other general symptoms and signs: Secondary | ICD-10-CM | POA: Diagnosis not present

## 2022-02-27 DIAGNOSIS — R404 Transient alteration of awareness: Secondary | ICD-10-CM | POA: Diagnosis not present

## 2022-02-27 NOTE — ED Notes (Signed)
RN reviewed discharge instructions with [facility]. VSS upon discharge

## 2022-02-27 NOTE — ED Notes (Signed)
Per [place] MD, [facility] okay to take pt with BP 205/96

## 2022-02-27 NOTE — ED Notes (Signed)
[facility] CALLED  °

## 2022-02-28 ENCOUNTER — Emergency Department (HOSPITAL_COMMUNITY)
Admission: EM | Admit: 2022-02-28 | Discharge: 2022-02-28 | Disposition: A | Discharge status: Skilled Nursing Facility | Payer: Medicare Other | Attending: Emergency Medicine | Admitting: Emergency Medicine

## 2022-02-28 ENCOUNTER — Emergency Department (HOSPITAL_COMMUNITY): Payer: Medicare Other

## 2022-02-28 ENCOUNTER — Encounter (HOSPITAL_COMMUNITY): Payer: Self-pay

## 2022-02-28 DIAGNOSIS — F0390 Unspecified dementia without behavioral disturbance: Secondary | ICD-10-CM | POA: Diagnosis not present

## 2022-02-28 DIAGNOSIS — Z7401 Bed confinement status: Secondary | ICD-10-CM | POA: Diagnosis not present

## 2022-02-28 DIAGNOSIS — I509 Heart failure, unspecified: Secondary | ICD-10-CM | POA: Insufficient documentation

## 2022-02-28 DIAGNOSIS — R0689 Other abnormalities of breathing: Secondary | ICD-10-CM | POA: Diagnosis not present

## 2022-02-28 DIAGNOSIS — R079 Chest pain, unspecified: Secondary | ICD-10-CM | POA: Diagnosis not present

## 2022-02-28 DIAGNOSIS — I110 Hypertensive heart disease with heart failure: Secondary | ICD-10-CM | POA: Diagnosis not present

## 2022-02-28 DIAGNOSIS — R404 Transient alteration of awareness: Secondary | ICD-10-CM | POA: Diagnosis not present

## 2022-02-28 DIAGNOSIS — R410 Disorientation, unspecified: Secondary | ICD-10-CM | POA: Diagnosis not present

## 2022-02-28 DIAGNOSIS — Z79899 Other long term (current) drug therapy: Secondary | ICD-10-CM | POA: Insufficient documentation

## 2022-02-28 DIAGNOSIS — R6889 Other general symptoms and signs: Secondary | ICD-10-CM | POA: Diagnosis not present

## 2022-02-28 DIAGNOSIS — R0789 Other chest pain: Secondary | ICD-10-CM | POA: Diagnosis not present

## 2022-02-28 DIAGNOSIS — R457 State of emotional shock and stress, unspecified: Secondary | ICD-10-CM | POA: Diagnosis not present

## 2022-02-28 DIAGNOSIS — F419 Anxiety disorder, unspecified: Secondary | ICD-10-CM | POA: Insufficient documentation

## 2022-02-28 DIAGNOSIS — Z743 Need for continuous supervision: Secondary | ICD-10-CM | POA: Diagnosis not present

## 2022-02-28 LAB — BASIC METABOLIC PANEL
Anion gap: 9 (ref 5–15)
BUN: 13 mg/dL (ref 8–23)
CO2: 23 mmol/L (ref 22–32)
Calcium: 10 mg/dL (ref 8.9–10.3)
Chloride: 105 mmol/L (ref 98–111)
Creatinine, Ser: 1.2 mg/dL — ABNORMAL HIGH (ref 0.44–1.00)
GFR, Estimated: 42 mL/min — ABNORMAL LOW (ref >60)
Glucose, Bld: 145 mg/dL — ABNORMAL HIGH (ref 70–99)
Potassium: 4.1 mmol/L (ref 3.5–5.1)
Sodium: 137 mmol/L (ref 135–145)

## 2022-02-28 LAB — CBC
HCT: 41 % (ref 36.0–46.0)
Hemoglobin: 12.5 g/dL (ref 12.0–15.0)
MCH: 26.7 pg (ref 26.0–34.0)
MCHC: 30.5 g/dL (ref 30.0–36.0)
MCV: 87.6 fL (ref 80.0–100.0)
Platelets: 239 10*3/uL (ref 150–400)
RBC: 4.68 MIL/uL (ref 3.87–5.11)
RDW: 17.9 % — ABNORMAL HIGH (ref 11.5–15.5)
WBC: 6.3 10*3/uL (ref 4.0–10.5)
nRBC: 0 % (ref 0.0–0.2)

## 2022-02-28 LAB — TROPONIN I (HIGH SENSITIVITY): Troponin I (High Sensitivity): 18 ng/L — ABNORMAL HIGH (ref <18)

## 2022-02-28 MED ORDER — ACETAMINOPHEN 325 MG PO TABS
650.0000 mg | ORAL_TABLET | Freq: Once | ORAL | Status: AC
  Administered 2022-02-28: 650 mg via ORAL
  Filled 2022-02-28: qty 2

## 2022-02-28 MED ORDER — ACETAMINOPHEN 325 MG PO TABS
325.0000 mg | ORAL_TABLET | Freq: Once | ORAL | Status: AC
  Administered 2022-02-28: 325 mg via ORAL
  Filled 2022-02-28: qty 1

## 2022-02-28 NOTE — Discharge Instructions (Addendum)
You were seen in the emergency department for your chest pain.  Your work-up showed no signs of heart attack, pneumonia, or fluid on your lungs.  It is unclear the cause of your chest pain at this time, but you can follow-up with your primary doctor to have your symptoms rechecked. It could be related to anxiety and you should talk to your primary doctor or can see a geriatrics doctor to talk about this to determine the best treatment.  You should return to the emergency department if your pain significantly worsens, you have worsening shortness of breath, you pass out, or if you have any other new or concerning symptoms.

## 2022-02-28 NOTE — ED Notes (Signed)
Unsuccessful IV attempt.

## 2022-02-28 NOTE — ED Notes (Signed)
[facility] arrived to transport patient. She reports she is "feeling sick." Dr. Francia Greaves at bedside to see patient.

## 2022-02-28 NOTE — ED Notes (Signed)
Patient given sandwich, cheese, and soda.

## 2022-02-28 NOTE — ED Notes (Signed)
[facility] called for transport.  

## 2022-02-28 NOTE — ED Triage Notes (Signed)
Pt arrived via EMS, [facility], c/o anxiety attack. Couldn't sleep, so called EMS

## 2022-02-28 NOTE — ED Notes (Signed)
Patient given orange juice

## 2022-02-28 NOTE — ED Provider Notes (Signed)
[facility] DEPT Provider Note   CSN: [id number] Arrival date & time: 02/28/22  1043     History  Chief Complaint  Patient presents with   Anxiety    [name] is a 88 y.o. female.  Patient is a 88 year old female with a past medical history of hypertension, mild dementia, CHF, CVA/TIA without residual deficits presenting to the emergency department for concern for anxiety attack.  The patient reports to me that she does not know why her assisted living center to the ER today.  I spoke with the assisted living and they state that she was complaining of chest pain and shortness of breath this morning.  They state that she is in independent living so they do not have access to her records or medications so they do not know what she has been treated for but do report that she has been seen in the emergency department several times over the last few weeks.  He states that these seem to be the same symptoms that she has been having for the last few weeks.  The patient does report that she has been having chest pain but denies any active chest pain.  She denies any shortness of breath, lightheadedness or dizziness, nausea, vomiting, fevers, chills or cough.   The history is provided by the patient, the nursing home and a relative.  Anxiety       Home Medications Prior to Admission medications   Medication Sig Start Date End Date Taking? Authorizing Provider  acetaminophen (TYLENOL) 500 MG tablet Take 1,000 mg by mouth every 6 (six) hours as needed for headache (pain).    [provider]  amiodarone (PACERONE) 200 MG tablet Take 200 mg by mouth daily at 12 noon. 12/11/21   [provider]  amLODipine (NORVASC) 5 MG tablet Take 5 mg by mouth daily. 02/07/22   [provider]  atorvastatin (LIPITOR) 40 MG tablet Take 1 tablet (40 mg total) by mouth daily at 6 PM. Patient taking differently: Take 40 mg by mouth every evening. 09/02/19    Dixie Dials, MD  cephALEXin (KEFLEX) 500 MG capsule Take 1 capsule (500 mg total) by mouth 3 (three) times daily. 02/26/22   Schutt, Grafton Folk, PA-C  clopidogrel (PLAVIX) 75 MG tablet Take 1 tablet (75 mg total) by mouth daily. Patient taking differently: Take 75 mg by mouth daily at 12 noon. 09/02/19   Dixie Dials, MD  diclofenac sodium (VOLTAREN) 1 % GEL APPLY 2-4 GRAMS TO AFFECTED AREA 2-3 TIMES A DAY AS NEEDED. Patient taking differently: Apply 1 Application topically 2 (two) times daily as needed (joint pain). 04/30/19   Meredith Pel, MD  esomeprazole (NEXIUM) 40 MG capsule Take 40 mg by mouth every morning.    [provider]  furosemide (LASIX) 40 MG tablet Take 0.5 tablets (20 mg total) by mouth daily. Patient taking differently: Take 40 mg by mouth daily. 02/07/22   Domenic Polite, MD  hydrALAZINE (APRESOLINE) 10 MG tablet Take 1 tablet (10 mg total) by mouth 2 (two) times daily. 12/02/21   Dixie Dials, MD  isosorbide mononitrate (IMDUR) 30 MG 24 hr tablet Take 30 mg by mouth every morning. 02/07/22   [provider]  levothyroxine (SYNTHROID) 75 MCG tablet Take 1 tablet (75 mcg total) by mouth daily. Patient taking differently: Take 75 mcg by mouth every morning. 01/03/22   Pokhrel, Corrie Mckusick, MD  losartan (COZAAR) 25 MG tablet Take 25 mg by mouth daily. 02/07/22  [provider]  Melatonin 3 MG TABS Take 3 mg by mouth at bedtime as needed (sleep).     [provider]  metoprolol tartrate (LOPRESSOR) 25 MG tablet Take 25 mg by mouth See admin instructions. Take one tablet (25 mg) by mouth twice daily - noon and evening 12/11/21   [provider]  Multiple Vitamins-Minerals (MULTIVITAMIN ADULTS 50+) TABS Take 1 tablet by mouth daily.    [provider]  nitroGLYCERIN (NITROSTAT) 0.4 MG SL tablet Place 1 tablet (0.4 mg total) under the tongue every 5 (five) minutes x 3 doses as needed for chest pain. 09/02/19   Dixie Dials, MD   Polyethyl Glycol-Propyl Glycol (SYSTANE) 0.4-0.3 % GEL ophthalmic gel Place 1 application into both eyes at bedtime.    [provider]  potassium chloride SA (KLOR-CON M20) 20 MEQ tablet Take 1 tablet (20 mEq total) by mouth every Monday, Wednesday, and Friday. Patient taking differently: Take 20 mEq by mouth daily at 12 noon. 12/03/21   Dixie Dials, MD  sucralfate (CARAFATE) 1 g tablet Take 1 tablet (1 g total) by mouth in the morning, at noon, and at bedtime for 7 doses. 02/25/22 02/28/22  Jeanell Sparrow, DO      Allergies    Codeine and Pregabalin    Review of Systems   Review of Systems  Physical Exam Updated Vital Signs BP (!) 160/74 (BP Location: Left Arm)   Pulse (!) 55   Temp 98.5 F (36.9 C) (Oral)   Resp 15   SpO2 99%  Physical Exam Vitals and nursing note reviewed.  Constitutional:      General: She is not in acute distress.    Appearance: Normal appearance.  HENT:     Head: Normocephalic and atraumatic.     Nose: Nose normal.     Mouth/Throat:     Mouth: Mucous membranes are moist.     Pharynx: Oropharynx is clear.  Eyes:     Extraocular Movements: Extraocular movements intact.     Conjunctiva/sclera: Conjunctivae normal.  Cardiovascular:     Rate and Rhythm: Normal rate and regular rhythm.     Pulses: Normal pulses.     Heart sounds: Murmur (systolic) heard.  Pulmonary:     Effort: Pulmonary effort is normal.     Breath sounds: Normal breath sounds.  Abdominal:     General: Abdomen is flat.     Palpations: Abdomen is soft.     Tenderness: There is no abdominal tenderness.  Musculoskeletal:        General: Normal range of motion.     Cervical back: Normal range of motion and neck supple.  Skin:    General: Skin is warm and dry.  Neurological:     General: No focal deficit present.     Mental Status: She is alert and oriented to person, place, and time.     Sensory: No sensory deficit.     Motor: No weakness.  Psychiatric:        Mood and  Affect: Mood normal.        Behavior: Behavior normal.     ED Results / Procedures / Treatments   Labs (all labs ordered are listed, but only abnormal results are displayed) Labs Reviewed - No data to display  EKG EKG Interpretation  Date/Time:  Monday February 28 2022 11:28:59 EDT Ventricular Rate:  59 PR Interval:  254 QRS Duration: 162 QT Interval:  492 QTC Calculation: 487 R Axis:   -  70 Text Interpretation: Sinus bradycardia with 1st degree A-V block Left axis deviation Left bundle branch block Abnormal ECG No significant change since last tracing Confirmed by Oneal Deputy [id number]) on 02/28/2022 11:37:55 AM  Radiology DG Chest 2 View  Result Date: 02/26/2022 CLINICAL DATA:  Shortness of breath EXAM: CHEST - 2 VIEW COMPARISON:  02/24/2022 FINDINGS: Lungs are clear.  No pleural effusion or pneumothorax. The heart is top-normal in size.  Thoracic aortic atherosclerosis. Degenerative changes of the left shoulder. Surgical clips along the right lateral chest wall/axilla. Mild degenerative changes of the visualized thoracolumbar spine. IMPRESSION: No evidence of acute cardiopulmonary disease. Electronically Signed   By: Julian Hy M.D.   On: 02/26/2022 18:34    Procedures Procedures    Medications Ordered in ED Medications - No data to display  ED Course/ Medical Decision Making/ A&P Clinical Course as of 02/28/22 1347  Mon Feb 28, 2022  1334 I called the patient's daughter ([name]) to update with the plan.  Her daughter states that she has been coming to the ER frequently thought to be due to anxiety.  She states that she does have an appointment with her primary care doctor tomorrow that she has had discussions about the patient's anxiety but they have not been able to come up with a solution.  I gave the daughter information for geriatric medicine who may be able to assist with her mother's anxiety. [VK]  6045 Patient's labs reviewed and interpreted  by myself show a mildly elevated troponin of 18 which has been downtrending over the last few days and EKG is without acute ischemic changes.  The patient has had ongoing chest pain for days and does not require repeat troponin today.  Creatinine is mildly elevated at 1.2 from baseline of 1.0.  The patient is otherwise stable for discharge back to her assisted living with outpatient follow-up.  Patient's daughter is agreeable with the plan. [VK]    Clinical Course User Index [VK] Ottie Glazier, DO                           Medical Decision Making This patient presents to the ED with chief complaint(s) of chest pain with pertinent past medical history of CHF, HTN, TIA/CVA and mild dementia which further complicates the presenting complaint. The complaint involves an extensive differential diagnosis and also carries with it a high risk of complications and morbidity.    The differential diagnosis includes ACS, arrhythmia, electrolyte abnormality, pneumonia, pneumothorax, pulmonary edema   Additional history obtained: Additional history obtained from family and nursing home/care facility Records reviewed previous ED records - patient has presented to the ED several times over the last few weeks for several symptoms  ED Course and Reassessment: Patient had no acute complaints on her arrival here but per the nursing home she was having chest pain and shortness of breath this morning which may be due to anxiety but due to her risk factors will have EKG and labs performed.  Independent labs interpretation:  The following labs were independently interpreted: Mildly elevated troponin of 18 and mildly elevated creatinine of 1.2 otherwise within normal range  Independent visualization of imaging: - I independently visualized the following imaging with scope of interpretation limited to determining acute life threatening conditions related to emergency care: Chest x-ray, which revealed no acute  disease  Consultation: - Consulted or discussed management/test interpretation w/ external professional: N/A  Consideration for admission or  further workup: Has no emergent conditions that require admission at this time.  The patient has good outpatient primary care follow-up and good family support and is stable for discharge back to her assisted living Social Determinants of health: N/A    Amount and/or Complexity of Data Reviewed Labs: ordered. Radiology: ordered.  Risk OTC drugs.          Final Clinical Impression(s) / ED Diagnoses Final diagnoses:  None    Rx / DC Orders ED Discharge Orders     None         Ottie Glazier, DO 02/28/22 1355

## 2022-02-28 NOTE — ED Notes (Signed)
Patient c/o headache and nausea. Provider aware.

## 2022-02-28 NOTE — ED Notes (Signed)
Pt arrived via EMS from [facility] c/o anxiety attack.

## 2022-02-28 NOTE — ED Notes (Signed)
Patient given water and graham crackers

## 2022-02-28 NOTE — ED Notes (Signed)
XR at bedside

## 2022-03-01 DIAGNOSIS — N6320 Unspecified lump in the left breast, unspecified quadrant: Secondary | ICD-10-CM | POA: Diagnosis not present

## 2022-03-01 DIAGNOSIS — M6281 Muscle weakness (generalized): Secondary | ICD-10-CM | POA: Diagnosis not present

## 2022-03-01 DIAGNOSIS — R2689 Other abnormalities of gait and mobility: Secondary | ICD-10-CM | POA: Diagnosis not present

## 2022-03-01 DIAGNOSIS — F419 Anxiety disorder, unspecified: Secondary | ICD-10-CM | POA: Diagnosis not present

## 2022-03-01 DIAGNOSIS — I350 Nonrheumatic aortic (valve) stenosis: Secondary | ICD-10-CM | POA: Diagnosis not present

## 2022-03-01 DIAGNOSIS — M25512 Pain in left shoulder: Secondary | ICD-10-CM | POA: Diagnosis not present

## 2022-03-01 DIAGNOSIS — E7800 Pure hypercholesterolemia, unspecified: Secondary | ICD-10-CM | POA: Diagnosis not present

## 2022-03-01 DIAGNOSIS — R488 Other symbolic dysfunctions: Secondary | ICD-10-CM | POA: Diagnosis not present

## 2022-03-01 DIAGNOSIS — Z85048 Personal history of other malignant neoplasm of rectum, rectosigmoid junction, and anus: Secondary | ICD-10-CM | POA: Diagnosis not present

## 2022-03-01 DIAGNOSIS — R41841 Cognitive communication deficit: Secondary | ICD-10-CM | POA: Diagnosis not present

## 2022-03-01 DIAGNOSIS — F0390 Unspecified dementia without behavioral disturbance: Secondary | ICD-10-CM | POA: Diagnosis not present

## 2022-03-01 DIAGNOSIS — I480 Paroxysmal atrial fibrillation: Secondary | ICD-10-CM | POA: Diagnosis not present

## 2022-03-02 DIAGNOSIS — M25512 Pain in left shoulder: Secondary | ICD-10-CM | POA: Diagnosis not present

## 2022-03-02 DIAGNOSIS — R2689 Other abnormalities of gait and mobility: Secondary | ICD-10-CM | POA: Diagnosis not present

## 2022-03-02 DIAGNOSIS — R488 Other symbolic dysfunctions: Secondary | ICD-10-CM | POA: Diagnosis not present

## 2022-03-02 DIAGNOSIS — R41841 Cognitive communication deficit: Secondary | ICD-10-CM | POA: Diagnosis not present

## 2022-03-02 DIAGNOSIS — M6281 Muscle weakness (generalized): Secondary | ICD-10-CM | POA: Diagnosis not present

## 2022-03-03 ENCOUNTER — Telehealth: Payer: Self-pay

## 2022-03-03 DIAGNOSIS — M25512 Pain in left shoulder: Secondary | ICD-10-CM | POA: Diagnosis not present

## 2022-03-03 DIAGNOSIS — R488 Other symbolic dysfunctions: Secondary | ICD-10-CM | POA: Diagnosis not present

## 2022-03-03 DIAGNOSIS — R41841 Cognitive communication deficit: Secondary | ICD-10-CM | POA: Diagnosis not present

## 2022-03-03 DIAGNOSIS — M6281 Muscle weakness (generalized): Secondary | ICD-10-CM | POA: Diagnosis not present

## 2022-03-03 DIAGNOSIS — R2689 Other abnormalities of gait and mobility: Secondary | ICD-10-CM | POA: Diagnosis not present

## 2022-03-03 NOTE — Telephone Encounter (Signed)
Spoke with patient's daughter Ivin Booty and scheduled a in person per request Palliative Consult for 03/11/22 @ 2 PM.   Consent obtained; updated Netsmart, Team List and Epic.

## 2022-03-04 ENCOUNTER — Encounter (HOSPITAL_COMMUNITY): Payer: Self-pay

## 2022-03-04 ENCOUNTER — Other Ambulatory Visit: Payer: Self-pay

## 2022-03-04 ENCOUNTER — Emergency Department (HOSPITAL_COMMUNITY)
Admission: EM | Admit: 2022-03-04 | Discharge: 2022-03-05 | Disposition: A | Discharge status: Skilled Nursing Facility | Payer: Medicare Other | Attending: Emergency Medicine | Admitting: Emergency Medicine

## 2022-03-04 ENCOUNTER — Emergency Department (HOSPITAL_COMMUNITY): Payer: Medicare Other

## 2022-03-04 DIAGNOSIS — M25512 Pain in left shoulder: Secondary | ICD-10-CM | POA: Diagnosis not present

## 2022-03-04 DIAGNOSIS — M6281 Muscle weakness (generalized): Secondary | ICD-10-CM | POA: Diagnosis not present

## 2022-03-04 DIAGNOSIS — R079 Chest pain, unspecified: Secondary | ICD-10-CM | POA: Diagnosis not present

## 2022-03-04 DIAGNOSIS — Z743 Need for continuous supervision: Secondary | ICD-10-CM | POA: Diagnosis not present

## 2022-03-04 DIAGNOSIS — R410 Disorientation, unspecified: Secondary | ICD-10-CM | POA: Diagnosis not present

## 2022-03-04 DIAGNOSIS — I10 Essential (primary) hypertension: Secondary | ICD-10-CM | POA: Insufficient documentation

## 2022-03-04 DIAGNOSIS — R5383 Other fatigue: Secondary | ICD-10-CM | POA: Diagnosis not present

## 2022-03-04 DIAGNOSIS — M19012 Primary osteoarthritis, left shoulder: Secondary | ICD-10-CM | POA: Diagnosis not present

## 2022-03-04 DIAGNOSIS — M25551 Pain in right hip: Secondary | ICD-10-CM | POA: Insufficient documentation

## 2022-03-04 DIAGNOSIS — S43002A Unspecified subluxation of left shoulder joint, initial encounter: Secondary | ICD-10-CM | POA: Diagnosis not present

## 2022-03-04 DIAGNOSIS — Z79899 Other long term (current) drug therapy: Secondary | ICD-10-CM | POA: Insufficient documentation

## 2022-03-04 DIAGNOSIS — Z853 Personal history of malignant neoplasm of breast: Secondary | ICD-10-CM | POA: Diagnosis not present

## 2022-03-04 DIAGNOSIS — F419 Anxiety disorder, unspecified: Secondary | ICD-10-CM | POA: Insufficient documentation

## 2022-03-04 DIAGNOSIS — R457 State of emotional shock and stress, unspecified: Secondary | ICD-10-CM | POA: Diagnosis not present

## 2022-03-04 DIAGNOSIS — M25561 Pain in right knee: Secondary | ICD-10-CM | POA: Insufficient documentation

## 2022-03-04 DIAGNOSIS — R404 Transient alteration of awareness: Secondary | ICD-10-CM | POA: Diagnosis not present

## 2022-03-04 LAB — CBC
HCT: 42 % (ref 36.0–46.0)
Hemoglobin: 13 g/dL (ref 12.0–15.0)
MCH: 26.6 pg (ref 26.0–34.0)
MCHC: 31 g/dL (ref 30.0–36.0)
MCV: 85.9 fL (ref 80.0–100.0)
Platelets: 241 10*3/uL (ref 150–400)
RBC: 4.89 MIL/uL (ref 3.87–5.11)
RDW: 17.7 % — ABNORMAL HIGH (ref 11.5–15.5)
WBC: 6 10*3/uL (ref 4.0–10.5)
nRBC: 0 % (ref 0.0–0.2)

## 2022-03-04 LAB — URINALYSIS, ROUTINE W REFLEX MICROSCOPIC
Bilirubin Urine: NEGATIVE
Glucose, UA: NEGATIVE mg/dL
Hgb urine dipstick: NEGATIVE
Ketones, ur: NEGATIVE mg/dL
Nitrite: NEGATIVE
Protein, ur: NEGATIVE mg/dL
Specific Gravity, Urine: 1.006 (ref 1.005–1.030)
pH: 7 (ref 5.0–8.0)

## 2022-03-04 LAB — BASIC METABOLIC PANEL
Anion gap: 7 (ref 5–15)
BUN: 20 mg/dL (ref 8–23)
CO2: 25 mmol/L (ref 22–32)
Calcium: 9.8 mg/dL (ref 8.9–10.3)
Chloride: 104 mmol/L (ref 98–111)
Creatinine, Ser: 1.09 mg/dL — ABNORMAL HIGH (ref 0.44–1.00)
GFR, Estimated: 47 mL/min — ABNORMAL LOW (ref >60)
Glucose, Bld: 108 mg/dL — ABNORMAL HIGH (ref 70–99)
Potassium: 4.1 mmol/L (ref 3.5–5.1)
Sodium: 136 mmol/L (ref 135–145)

## 2022-03-04 MED ORDER — LORAZEPAM 2 MG/ML IJ SOLN
0.5000 mg | Freq: Once | INTRAMUSCULAR | Status: AC
  Administered 2022-03-04: 0.5 mg via INTRAVENOUS
  Filled 2022-03-04: qty 1

## 2022-03-04 MED ORDER — HYDRALAZINE HCL 10 MG PO TABS
10.0000 mg | ORAL_TABLET | Freq: Two times a day (BID) | ORAL | Status: DC
Start: 2022-03-04 — End: 2022-03-05
  Administered 2022-03-05: 10 mg via ORAL
  Filled 2022-03-04: qty 1

## 2022-03-04 MED ORDER — AMLODIPINE BESYLATE 5 MG PO TABS
5.0000 mg | ORAL_TABLET | Freq: Every day | ORAL | Status: DC
  Administered 2022-03-05: 5 mg via ORAL
  Filled 2022-03-04: qty 1

## 2022-03-04 MED ORDER — CLOPIDOGREL BISULFATE 75 MG PO TABS
75.0000 mg | ORAL_TABLET | Freq: Every day | ORAL | Status: DC
  Administered 2022-03-05: 75 mg via ORAL
  Filled 2022-03-04: qty 1

## 2022-03-04 MED ORDER — ACETAMINOPHEN 500 MG PO TABS
1000.0000 mg | ORAL_TABLET | Freq: Four times a day (QID) | ORAL | Status: DC | PRN
Start: 2022-03-04 — End: 2022-03-05

## 2022-03-04 MED ORDER — MELATONIN 3 MG PO TABS
3.0000 mg | ORAL_TABLET | Freq: Every evening | ORAL | Status: DC | PRN
Start: 2022-03-04 — End: 2022-03-05

## 2022-03-04 MED ORDER — SUCRALFATE 1 G PO TABS
1.0000 g | ORAL_TABLET | Freq: Three times a day (TID) | ORAL | Status: DC
  Administered 2022-03-05: 1 g via ORAL
  Filled 2022-03-04: qty 1

## 2022-03-04 MED ORDER — ADULT MULTIVITAMIN W/MINERALS CH
1.0000 | ORAL_TABLET | Freq: Every day | ORAL | Status: DC
  Administered 2022-03-05: 1 via ORAL
  Filled 2022-03-04: qty 1

## 2022-03-04 MED ORDER — FUROSEMIDE 40 MG PO TABS
20.0000 mg | ORAL_TABLET | Freq: Every day | ORAL | Status: DC
  Administered 2022-03-05: 20 mg via ORAL
  Filled 2022-03-04: qty 1

## 2022-03-04 MED ORDER — DICLOFENAC SODIUM 1 % TD GEL: 2.0000 g | Freq: Two times a day (BID) | TRANSDERMAL | Status: DC | PRN

## 2022-03-04 MED ORDER — ACETAMINOPHEN 325 MG PO TABS
650.0000 mg | ORAL_TABLET | Freq: Once | ORAL | Status: AC
Start: 2022-03-04 — End: 2022-03-04
  Administered 2022-03-04: 650 mg via ORAL
  Filled 2022-03-04: qty 2

## 2022-03-04 MED ORDER — PANTOPRAZOLE SODIUM 40 MG PO TBEC
40.0000 mg | DELAYED_RELEASE_TABLET | Freq: Every day | ORAL | Status: DC
  Administered 2022-03-05: 40 mg via ORAL
  Filled 2022-03-04: qty 1

## 2022-03-04 MED ORDER — LEVOTHYROXINE SODIUM 50 MCG PO TABS
75.0000 ug | ORAL_TABLET | Freq: Every morning | ORAL | Status: DC
  Administered 2022-03-05: 75 ug via ORAL
  Filled 2022-03-04: qty 1

## 2022-03-04 MED ORDER — METOPROLOL TARTRATE 25 MG PO TABS: 25.0000 mg | ORAL_TABLET | ORAL | Status: DC

## 2022-03-04 MED ORDER — ACETAMINOPHEN 325 MG PO TABS
650.0000 mg | ORAL_TABLET | Freq: Once | ORAL | Status: AC
  Administered 2022-03-04: 650 mg via ORAL
  Filled 2022-03-04: qty 2

## 2022-03-04 MED ORDER — ISOSORBIDE MONONITRATE ER 30 MG PO TB24
30.0000 mg | ORAL_TABLET | Freq: Every morning | ORAL | Status: DC
  Administered 2022-03-05: 30 mg via ORAL
  Filled 2022-03-04: qty 1

## 2022-03-04 MED ORDER — LOSARTAN POTASSIUM 25 MG PO TABS
25.0000 mg | ORAL_TABLET | Freq: Every day | ORAL | Status: DC
  Administered 2022-03-05: 25 mg via ORAL
  Filled 2022-03-04: qty 1

## 2022-03-04 MED ORDER — POTASSIUM CHLORIDE CRYS ER 20 MEQ PO TBCR: 20.0000 meq | EXTENDED_RELEASE_TABLET | Freq: Every day | ORAL | Status: DC

## 2022-03-04 MED ORDER — ATORVASTATIN CALCIUM 40 MG PO TABS: 40.0000 mg | ORAL_TABLET | Freq: Every day | ORAL | Status: DC

## 2022-03-04 MED ORDER — AMIODARONE HCL 200 MG PO TABS
200.0000 mg | ORAL_TABLET | Freq: Every day | ORAL | Status: DC
  Filled 2022-03-04: qty 1

## 2022-03-04 MED ORDER — POLYETHYL GLYCOL-PROPYL GLYCOL 0.4-0.3 % OP GEL: 1.0000 | Freq: Every day | OPHTHALMIC | Status: DC

## 2022-03-04 NOTE — ED Provider Notes (Signed)
[facility] DEPT Provider Note   CSN: [id number] Arrival date & time: 03/04/22  1147     History  Chief Complaint  Patient presents with   Anxiety    [name] is a 88 y.o. female.  HPI 88 year old female history of breast cancer, anxiety, hypertension, GERD and gastritis, presents today from facility with reports that she is having a panic attack.  Patient states that she has some pain in her left shoulder and her right knee.  Is unable to give me further history regarding this.  She currently depressed denies headache, chest pain, falls, nausea, vomiting, or diarrhea     Home Medications Prior to Admission medications   Medication Sig Start Date End Date Taking? Authorizing Provider  acetaminophen (TYLENOL) 500 MG tablet Take 1,000 mg by mouth every 6 (six) hours as needed for headache (pain).    [provider]  amiodarone (PACERONE) 200 MG tablet Take 200 mg by mouth daily at 12 noon. 12/11/21   [provider]  amLODipine (NORVASC) 5 MG tablet Take 5 mg by mouth daily. 02/07/22   [provider]  atorvastatin (LIPITOR) 40 MG tablet Take 1 tablet (40 mg total) by mouth daily at 6 PM. Patient taking differently: Take 40 mg by mouth every evening. 09/02/19   Dixie Dials, MD  cephALEXin (KEFLEX) 500 MG capsule Take 1 capsule (500 mg total) by mouth 3 (three) times daily. 02/26/22   Schutt, Grafton Folk, PA-C  clopidogrel (PLAVIX) 75 MG tablet Take 1 tablet (75 mg total) by mouth daily. Patient taking differently: Take 75 mg by mouth daily at 12 noon. 09/02/19   Dixie Dials, MD  diclofenac sodium (VOLTAREN) 1 % GEL APPLY 2-4 GRAMS TO AFFECTED AREA 2-3 TIMES A DAY AS NEEDED. Patient taking differently: Apply 1 Application topically 2 (two) times daily as needed (joint pain). 04/30/19   Meredith Pel, MD  esomeprazole (NEXIUM) 40 MG capsule Take 40 mg by mouth every morning.    [provider]  furosemide  (LASIX) 40 MG tablet Take 0.5 tablets (20 mg total) by mouth daily. Patient taking differently: Take 40 mg by mouth daily. 02/07/22   Domenic Polite, MD  hydrALAZINE (APRESOLINE) 10 MG tablet Take 1 tablet (10 mg total) by mouth 2 (two) times daily. 12/02/21   Dixie Dials, MD  isosorbide mononitrate (IMDUR) 30 MG 24 hr tablet Take 30 mg by mouth every morning. 02/07/22   [provider]  levothyroxine (SYNTHROID) 75 MCG tablet Take 1 tablet (75 mcg total) by mouth daily. Patient taking differently: Take 75 mcg by mouth every morning. 01/03/22   Pokhrel, Corrie Mckusick, MD  losartan (COZAAR) 25 MG tablet Take 25 mg by mouth daily. 02/07/22   [provider]  Melatonin 3 MG TABS Take 3 mg by mouth at bedtime as needed (sleep).     [provider]  metoprolol tartrate (LOPRESSOR) 25 MG tablet Take 25 mg by mouth See admin instructions. Take one tablet (25 mg) by mouth twice daily - noon and evening 12/11/21   [provider]  Multiple Vitamins-Minerals (MULTIVITAMIN ADULTS 50+) TABS Take 1 tablet by mouth daily.    [provider]  nitroGLYCERIN (NITROSTAT) 0.4 MG SL tablet Place 1 tablet (0.4 mg total) under the tongue every 5 (five) minutes x 3 doses as needed for chest pain. 09/02/19   Dixie Dials, MD  Polyethyl Glycol-Propyl Glycol (SYSTANE) 0.4-0.3 % GEL ophthalmic gel Place 1 application into both eyes at bedtime.  [provider]  potassium chloride SA (KLOR-CON M20) 20 MEQ tablet Take 1 tablet (20 mEq total) by mouth every Monday, Wednesday, and Friday. Patient taking differently: Take 20 mEq by mouth daily at 12 noon. 12/03/21   Dixie Dials, MD  sucralfate (CARAFATE) 1 g tablet Take 1 tablet (1 g total) by mouth in the morning, at noon, and at bedtime for 7 doses. 02/25/22 02/28/22  Jeanell Sparrow, DO      Allergies    Codeine and Pregabalin    Review of Systems   Review of Systems  Physical Exam Updated Vital Signs BP (!) 135/95   Pulse 81    Temp 97.8 F (36.6 C)   Resp 17   Ht 1.702 m ('5\' 7"'$ )   Wt 70 kg   SpO2 100%   BMI 24.17 kg/m  Physical Exam Vitals and nursing note reviewed.  Constitutional:      Appearance: Normal appearance.  HENT:     Head: Normocephalic.     Left Ear: External ear normal.     Nose: Nose normal.     Mouth/Throat:     Pharynx: Oropharynx is clear.  Eyes:     Pupils: Pupils are equal, round, and reactive to light.  Cardiovascular:     Rate and Rhythm: Normal rate and regular rhythm.     Pulses: Normal pulses.  Pulmonary:     Effort: Pulmonary effort is normal.     Breath sounds: Normal breath sounds.  Abdominal:     General: Bowel sounds are normal.     Palpations: Abdomen is soft.  Musculoskeletal:     Cervical back: Normal range of motion.     Comments: Mild tenderness and some subluxation of left shoulder No obvious external signs of trauma Sensation and pulses intact Mild tenderness of right hip through right knee No obvious external signs of trauma no crepitus full active range of motion Back visually inspected with no signs of trauma no point tenderness palpation over cervical, thoracic, or lumbar spine  Skin:    Capillary Refill: Capillary refill takes less than 2 seconds.     Comments: No wounds or skin breakdown noted  Neurological:     General: No focal deficit present.     Mental Status: She is alert.     Cranial Nerves: No cranial nerve deficit.     Motor: No weakness.     Comments: Patient is oriented to person and hospital not oriented to date  Psychiatric:        Mood and Affect: Mood normal.     ED Results / Procedures / Treatments   Labs (all labs ordered are listed, but only abnormal results are displayed) Labs Reviewed  CBC - Abnormal; Notable for the following components:      Result Value   RDW 17.7 (*)    All other components within normal limits  BASIC METABOLIC PANEL - Abnormal; Notable for the following components:   Glucose, Bld 108 (*)     Creatinine, Ser 1.09 (*)    GFR, Estimated 47 (*)    All other components within normal limits  URINALYSIS, ROUTINE W REFLEX MICROSCOPIC - Abnormal; Notable for the following components:   Color, Urine STRAW (*)    Leukocytes,Ua SMALL (*)    Bacteria, UA RARE (*)    All other components within normal limits    EKG EKG Interpretation  Date/Time:  Friday March 04 2022 11:59:46 EDT Ventricular Rate:  55 PR Interval:  248 QRS Duration: 173 QT Interval:  509 QTC Calculation: 487 R Axis:   -51 Text Interpretation: Sinus rhythm Prolonged PR interval Left bundle branch block No significant change since last tracing Confirmed by Pattricia Boss [phone]) on 03/04/2022 12:06:15 PM  Radiology DG Chest 2 View  Result Date: 03/04/2022 CLINICAL DATA:  Shoulder pain. EXAM: CHEST - 2 VIEW COMPARISON:  February 28, 2022. FINDINGS: Stable cardiomediastinal silhouette. Old right clavicular fracture is noted. Surgical clips are noted in right axillary region. Mild bibasilar subsegmental atelectasis or scarring is noted. Bony thorax is unremarkable. IMPRESSION: Mild bibasilar subsegmental atelectasis or scarring. Aortic Atherosclerosis (ICD10-I70.0). Electronically Signed   By: Marijo Conception M.D.   On: 03/04/2022 13:05   DG Hip Unilat W or Wo Pelvis 2-3 Views Right  Result Date: 03/04/2022 CLINICAL DATA:  Right hip pain. EXAM: DG HIP (WITH OR WITHOUT PELVIS) 2-3V RIGHT COMPARISON:  None Available. FINDINGS: There is no evidence of hip fracture or dislocation. There is no evidence of arthropathy or other focal bone abnormality. IMPRESSION: Negative. Electronically Signed   By: Marijo Conception M.D.   On: 03/04/2022 13:03   DG Shoulder Left  Result Date: 03/04/2022 CLINICAL DATA:  Left shoulder pain. EXAM: LEFT SHOULDER - 2+ VIEW COMPARISON:  March 10, 2020. FINDINGS: There is no definite evidence of fracture. Probable severe degenerative changes seen involving the glenohumeral joint as noted on prior exam.  Probable mild anterior subluxation of the humeral head relative to glenoid fossa is noted as well. IMPRESSION: No definite fracture is noted. Severe osteoarthritis of the glenohumeral joint. Probable mild anterior subluxation of the humeral head relative to glenoid fossa. Electronically Signed   By: Marijo Conception M.D.   On: 03/04/2022 13:01   DG Knee Complete 4 Views Right  Result Date: 03/04/2022 CLINICAL DATA:  Right knee pain. EXAM: RIGHT KNEE - COMPLETE 4+ VIEW COMPARISON:  January 26, 2022. FINDINGS: No fracture or dislocation is noted. Small suprapatellar joint effusion is noted. Severe narrowing is seen involving the medial joint space with osteophyte formation. Mild narrowing and chondrocalcinosis is noted laterally. IMPRESSION: Small suprapatellar joint effusion. Severe degenerative joint disease is noted medially. No fracture or dislocation is noted. Electronically Signed   By: Marijo Conception M.D.   On: 03/04/2022 12:58    Procedures Procedures    Medications Ordered in ED Medications  LORazepam (ATIVAN) injection 0.5 mg (0.5 mg Intravenous Given 03/04/22 1322)  acetaminophen (TYLENOL) tablet 650 mg (650 mg Oral Given 03/04/22 1322)    ED Course/ Medical Decision Making/ A&P Clinical Course as of 03/04/22 1618  Fri Mar 04, 6833  1962 Metabolic panel reviewed interpreted and no evidence of acute abnormality noted [DR]  1331 CBC(!) CBC reviewed interpreted no evidence of acute abnormality noted [DR]  1332 Chest x-Calais Svehla reviewed and interpreted and no evidence of acute abnormality noted reviewed radiologist interpretation with mild by bibasilar subsegmental atelectasis or scarring noted [DR]  1332 Pelvis and right hip x-Adonna Horsley reviewed interpreted no evidence accountability noted and radiologist interpretation concurs [DR]  1332 Left shoulder x-Tramon Crescenzo reviewed interpreted no evidence of acute fracture is noted although mild subluxation is present [DR]  1333 Right knee x-Mistie Adney reviewed interpreted no  evidence of acute abnormality noted on my interpretation radiologist interpretation notes small suprapatellar joint effusion with severe DJD no evidence of fracture dislocation [DR]  1333 DG Shoulder Left [DR]  1612 Urinalysis reviewed interpreted small LE 0-5 white blood cells 0-5 red blood cells rare bacteria doubt  significant urinary tract infection [DR]    Clinical Course User Index [DR] Pattricia Boss, MD                           Medical Decision Making 88 year old female presents today with reports of anxiety, also complaining of some left shoulder and right knee pain. Imaging was obtained that does not show any evidence of acute fracture but possible mild subluxation of the left shoulder and a small effusion noted of the right knee. Labs reviewed and appear to be in normal limits for CBC and chemistry Awaiting urinalysis Patient appears to be stable for discharge back to her facility. We will refer to primary care to recheck knee and shoulder Urinalysis reviewed doubt infection Patient appears hemodynamically stable for discharge  Amount and/or Complexity of Data Reviewed Labs: ordered. Decision-making details documented in ED Course. Radiology: ordered. Decision-making details documented in ED Course.  Risk OTC drugs. Prescription drug management.           Final Clinical Impression(s) / ED Diagnoses Final diagnoses:  Left shoulder pain, unspecified chronicity  Subluxation of left shoulder joint, initial encounter  Right knee pain, unspecified chronicity    Rx / DC Orders ED Discharge Orders     None         Pattricia Boss, MD 03/04/22 [phone]

## 2022-03-04 NOTE — ED Triage Notes (Signed)
Pt coming from [facility] with c/o anxiety, panic attack. Hx of same.

## 2022-03-04 NOTE — Discharge Instructions (Addendum)
[name] should not walk without assistance until her medication has worn off.  She received a small dose of ativan here in the emergency department for anxiety. You appear to be stable for discharge There is a small effusion on your right knee and some mild subluxation of the left shoulder likely causing your pain. Please follow-up with your doctor next week and they can advise regarding any further follow-up.

## 2022-03-04 NOTE — ED Notes (Signed)
Pt updated about transport with [facility]

## 2022-03-04 NOTE — ED Notes (Signed)
Report called in to [facility]

## 2022-03-05 DIAGNOSIS — Z043 Encounter for examination and observation following other accident: Secondary | ICD-10-CM | POA: Diagnosis not present

## 2022-03-05 DIAGNOSIS — B965 Pseudomonas (aeruginosa) (mallei) (pseudomallei) as the cause of diseases classified elsewhere: Secondary | ICD-10-CM | POA: Diagnosis not present

## 2022-03-05 DIAGNOSIS — E039 Hypothyroidism, unspecified: Secondary | ICD-10-CM | POA: Diagnosis not present

## 2022-03-05 DIAGNOSIS — I739 Peripheral vascular disease, unspecified: Secondary | ICD-10-CM | POA: Diagnosis not present

## 2022-03-05 DIAGNOSIS — E860 Dehydration: Secondary | ICD-10-CM | POA: Diagnosis not present

## 2022-03-05 DIAGNOSIS — M6281 Muscle weakness (generalized): Secondary | ICD-10-CM | POA: Diagnosis not present

## 2022-03-05 DIAGNOSIS — I5032 Chronic diastolic (congestive) heart failure: Secondary | ICD-10-CM | POA: Diagnosis not present

## 2022-03-05 DIAGNOSIS — F411 Generalized anxiety disorder: Secondary | ICD-10-CM | POA: Diagnosis not present

## 2022-03-05 DIAGNOSIS — Z85038 Personal history of other malignant neoplasm of large intestine: Secondary | ICD-10-CM | POA: Diagnosis not present

## 2022-03-05 DIAGNOSIS — C50911 Malignant neoplasm of unspecified site of right female breast: Secondary | ICD-10-CM | POA: Diagnosis not present

## 2022-03-05 DIAGNOSIS — R2689 Other abnormalities of gait and mobility: Secondary | ICD-10-CM | POA: Diagnosis not present

## 2022-03-05 DIAGNOSIS — K5900 Constipation, unspecified: Secondary | ICD-10-CM | POA: Diagnosis not present

## 2022-03-05 DIAGNOSIS — E876 Hypokalemia: Secondary | ICD-10-CM | POA: Diagnosis present

## 2022-03-05 DIAGNOSIS — Z7189 Other specified counseling: Secondary | ICD-10-CM | POA: Diagnosis not present

## 2022-03-05 DIAGNOSIS — I480 Paroxysmal atrial fibrillation: Secondary | ICD-10-CM | POA: Diagnosis not present

## 2022-03-05 DIAGNOSIS — F0394 Unspecified dementia, unspecified severity, with anxiety: Secondary | ICD-10-CM | POA: Diagnosis present

## 2022-03-05 DIAGNOSIS — Z9011 Acquired absence of right breast and nipple: Secondary | ICD-10-CM | POA: Diagnosis not present

## 2022-03-05 DIAGNOSIS — N390 Urinary tract infection, site not specified: Secondary | ICD-10-CM | POA: Diagnosis not present

## 2022-03-05 DIAGNOSIS — K219 Gastro-esophageal reflux disease without esophagitis: Secondary | ICD-10-CM | POA: Diagnosis not present

## 2022-03-05 DIAGNOSIS — Z96642 Presence of left artificial hip joint: Secondary | ICD-10-CM | POA: Diagnosis not present

## 2022-03-05 DIAGNOSIS — N179 Acute kidney failure, unspecified: Secondary | ICD-10-CM | POA: Diagnosis not present

## 2022-03-05 DIAGNOSIS — I6782 Cerebral ischemia: Secondary | ICD-10-CM | POA: Diagnosis not present

## 2022-03-05 DIAGNOSIS — R296 Repeated falls: Secondary | ICD-10-CM | POA: Diagnosis not present

## 2022-03-05 DIAGNOSIS — D509 Iron deficiency anemia, unspecified: Secondary | ICD-10-CM | POA: Diagnosis not present

## 2022-03-05 DIAGNOSIS — Z743 Need for continuous supervision: Secondary | ICD-10-CM | POA: Diagnosis not present

## 2022-03-05 DIAGNOSIS — R413 Other amnesia: Secondary | ICD-10-CM | POA: Diagnosis not present

## 2022-03-05 DIAGNOSIS — F0390 Unspecified dementia without behavioral disturbance: Secondary | ICD-10-CM | POA: Diagnosis not present

## 2022-03-05 DIAGNOSIS — Z79899 Other long term (current) drug therapy: Secondary | ICD-10-CM | POA: Diagnosis not present

## 2022-03-05 DIAGNOSIS — D630 Anemia in neoplastic disease: Secondary | ICD-10-CM | POA: Diagnosis present

## 2022-03-05 DIAGNOSIS — R41841 Cognitive communication deficit: Secondary | ICD-10-CM | POA: Diagnosis not present

## 2022-03-05 DIAGNOSIS — S0990XA Unspecified injury of head, initial encounter: Secondary | ICD-10-CM | POA: Diagnosis not present

## 2022-03-05 DIAGNOSIS — I639 Cerebral infarction, unspecified: Secondary | ICD-10-CM | POA: Diagnosis not present

## 2022-03-05 DIAGNOSIS — I10 Essential (primary) hypertension: Secondary | ICD-10-CM | POA: Diagnosis not present

## 2022-03-05 DIAGNOSIS — R531 Weakness: Secondary | ICD-10-CM | POA: Diagnosis not present

## 2022-03-05 DIAGNOSIS — N1832 Chronic kidney disease, stage 3b: Secondary | ICD-10-CM | POA: Diagnosis not present

## 2022-03-05 DIAGNOSIS — G9389 Other specified disorders of brain: Secondary | ICD-10-CM | POA: Diagnosis not present

## 2022-03-05 DIAGNOSIS — E8809 Other disorders of plasma-protein metabolism, not elsewhere classified: Secondary | ICD-10-CM | POA: Diagnosis not present

## 2022-03-05 DIAGNOSIS — Z515 Encounter for palliative care: Secondary | ICD-10-CM | POA: Diagnosis not present

## 2022-03-05 DIAGNOSIS — M25561 Pain in right knee: Secondary | ICD-10-CM | POA: Diagnosis not present

## 2022-03-05 DIAGNOSIS — Z8673 Personal history of transient ischemic attack (TIA), and cerebral infarction without residual deficits: Secondary | ICD-10-CM | POA: Diagnosis not present

## 2022-03-05 DIAGNOSIS — I161 Hypertensive emergency: Secondary | ICD-10-CM | POA: Diagnosis present

## 2022-03-05 DIAGNOSIS — Z66 Do not resuscitate: Secondary | ICD-10-CM | POA: Diagnosis not present

## 2022-03-05 DIAGNOSIS — J9811 Atelectasis: Secondary | ICD-10-CM | POA: Diagnosis not present

## 2022-03-05 DIAGNOSIS — R627 Adult failure to thrive: Secondary | ICD-10-CM | POA: Diagnosis not present

## 2022-03-05 DIAGNOSIS — R6251 Failure to thrive (child): Secondary | ICD-10-CM | POA: Diagnosis not present

## 2022-03-05 DIAGNOSIS — I130 Hypertensive heart and chronic kidney disease with heart failure and stage 1 through stage 4 chronic kidney disease, or unspecified chronic kidney disease: Secondary | ICD-10-CM | POA: Diagnosis not present

## 2022-03-05 DIAGNOSIS — S81001A Unspecified open wound, right knee, initial encounter: Secondary | ICD-10-CM | POA: Diagnosis not present

## 2022-03-05 DIAGNOSIS — N6320 Unspecified lump in the left breast, unspecified quadrant: Secondary | ICD-10-CM | POA: Diagnosis not present

## 2022-03-05 DIAGNOSIS — Z853 Personal history of malignant neoplasm of breast: Secondary | ICD-10-CM | POA: Diagnosis not present

## 2022-03-05 DIAGNOSIS — N3000 Acute cystitis without hematuria: Secondary | ICD-10-CM | POA: Diagnosis not present

## 2022-03-05 DIAGNOSIS — S4982XA Other specified injuries of left shoulder and upper arm, initial encounter: Secondary | ICD-10-CM | POA: Diagnosis not present

## 2022-03-05 DIAGNOSIS — W19XXXA Unspecified fall, initial encounter: Secondary | ICD-10-CM | POA: Diagnosis not present

## 2022-03-05 DIAGNOSIS — F32A Depression, unspecified: Secondary | ICD-10-CM | POA: Diagnosis not present

## 2022-03-05 MED ORDER — DICLOFENAC SODIUM 1 % EX GEL
2.0000 g | Freq: Two times a day (BID) | CUTANEOUS | Status: DC | PRN
Start: 2022-03-05 — End: 2022-03-05
  Filled 2022-03-05: qty 100

## 2022-03-05 MED ORDER — ARTIFICIAL TEARS OPHTHALMIC OINT
TOPICAL_OINTMENT | Freq: Every day | OPHTHALMIC | Status: DC
  Filled 2022-03-05: qty 3.5

## 2022-03-05 MED ORDER — METOPROLOL TARTRATE 25 MG PO TABS
25.0000 mg | ORAL_TABLET | Freq: Two times a day (BID) | ORAL | Status: DC
  Administered 2022-03-05: 25 mg via ORAL
  Filled 2022-03-05: qty 1

## 2022-03-05 NOTE — ED Notes (Signed)
[facility] called for transport.  

## 2022-03-05 NOTE — Progress Notes (Signed)
CSW spoke with Pt's daughter, Ivin Booty, via phone @ [phone]. Daughter states she just hung up with RN and that daughter is in the process of contacting [facility] to arrange for a caregiver to be with Pt when d/c'ed back to apartment. Daughter further states  that family is in the process of seeking hospice care for Pt and that family has been in ongoing discussions with PCP about ongoing care issues.

## 2022-03-05 NOTE — ED Provider Notes (Signed)
Patient had been evaluated for knee and shoulder pain and anxiety.  She was discharged but reportedly her facility sent her back by PTR reporting they had no one to care for her due to limited mobility\bedbound.  Occasions were restarted and TOC consulted.  At this point [facility] is advised that the facility is prepared to accept the patient back for ongoing care.  I have reassessed the patient.  Her main complaint continues to be her right knee. BP (!) 148/52   Pulse 64   Temp 98.1 F (36.7 C) (Oral)   Resp 16   Ht '5\' 7"'$  (1.702 m)   Wt 70 kg   SpO2 98%   BMI 24.17 kg/m   Physical Exam Constitutional:      Comments: As I approached the patient she is resting comfortably in bed.  She has a beverage and crackers at bedside.  She awakens easily to light voice.  She is alert.  No respiratory distress.  HENT:     Mouth/Throat:     Pharynx: Oropharynx is clear.  Eyes:     Extraocular Movements: Extraocular movements intact.  Pulmonary:     Effort: Pulmonary effort is normal.  Musculoskeletal:     Comments: I have reexamined the patient's right knee which is her main area of pain and complaint.  Knee has a moderate effusion.  There is no erythema.  No warmth to touch.  The lower leg does not have any edema or erythema associated with it.  Left knee has arthritic changes but no significant effusion.  lower legs are symmetric.  Neurological:     General: No focal deficit present.  Psychiatric:        Mood and Affect: Mood normal.     Procedures  Procedures  ED Course / MDM   Clinical Course as of 03/05/22 0849  Fri Mar 05, 9023  0973 Metabolic panel reviewed interpreted and no evidence of acute abnormality noted [DR]  1331 CBC(!) CBC reviewed interpreted no evidence of acute abnormality noted [DR]  1332 Chest x-ray reviewed and interpreted and no evidence of acute abnormality noted reviewed radiologist interpretation with mild by bibasilar subsegmental atelectasis or scarring noted [DR]   1332 Pelvis and right hip x-ray reviewed interpreted no evidence accountability noted and radiologist interpretation concurs [DR]  1332 Left shoulder x-ray reviewed interpreted no evidence of acute fracture is noted although mild subluxation is present [DR]  1333 Right knee x-ray reviewed interpreted no evidence of acute abnormality noted on my interpretation radiologist interpretation notes small suprapatellar joint effusion with severe DJD no evidence of fracture dislocation [DR]  1333 DG Shoulder Left [DR]  1612 Urinalysis reviewed interpreted small LE 0-5 white blood cells 0-5 red blood cells rare bacteria doubt significant urinary tract infection [DR]    Clinical Course User Index [DR] Pattricia Boss, MD   Medical Decision Making Amount and/or Complexity of Data Reviewed Labs: ordered. Decision-making details documented in ED Course. Radiology: ordered. Decision-making details documented in ED Course.  Risk OTC drugs. Prescription drug management.   Patient is alert.  She is clinically well in appearance.  She is in no acute distress.  She is well-nourished well-developed and well-groomed.  Patient was previously evaluated for her main complaints of joint pain.  Reassessed the joint and reviewed the x-ray reads.  Findings are consistent with a reactive moderate-sized effusion of the right knee.  Patient has significant degenerative joint disease.  There is no appearance of a septic joint.  Patient does consistently  take Plavix.  At this time I feel that the risk benefit of aspirating the knee for therapeutic comfort is in favor of conservative measures with compression with Ace wrap and icing and pain control and follow-up with orthopedics.  As the knee is right now, I feel that the risk of hematoma, increased pain or infection from procedure is not indicated.  I have added orders for Ace wrap, ice and patient's a.m. medications with plan for return to SNF.  I reviewed this plan with the  patient and she is agreeable and wishing to return to her home.       Charlesetta Shanks, MD 03/05/22 [phone]

## 2022-03-05 NOTE — ED Notes (Addendum)
This nurse spoke with the patient's daughter, Ivin Booty, to confirm that the patient's home health aide would be at the patient's home facility to accept patient when [name] brought her back to [facility] independent living. The daughter stated that the patient's aide is at [facility] and will be there to accept her.

## 2022-03-05 NOTE — ED Provider Notes (Signed)
Patient was in the emergency department for anxiety and extremity pain.  She had been discharged back to her facility, but was brought back by [facility] because they report patient had no one to care for her and she is bedbound. Patient has had multiple ER visits in the past 6 months. She  is awake and alert but continues to report left shoulder pain and right knee pain, but that imaging has already been addressed. Home meds have already been ordered [facility] consult has been placed, will board in the ER    Ripley Fraise, MD 03/05/22 0100

## 2022-03-05 NOTE — ED Notes (Signed)
Patient brought back by [facility] after being discharged. They said that patient was in the independent living facility at [facility]. However patient has dementia and is bed bound. They said they could not just leave her sitting there in this condition. They could not find any nurses around at [facility]. They brought the patient back and stated patient needs a social work consult and higher level of care.   Dr. Christy Gentles notified.

## 2022-03-05 NOTE — Progress Notes (Signed)
.Transition of Care [facility]) - Emergency Department Mini Assessment   Patient Details  Name: [name] MRN: [id number] Date of Birth: [birth year]  Transition of Care [facility]) CM/SW Contact:    Illene Regulus, LCSW Phone Number: 03/05/2022, 7:56 AM   Clinical Narrative: CSW received a consult in regard to pt being bed-bound and needing a higher level of care. Pt is from [facility] independent [facility].   CSW attempted to contact pt's daughter Dorothe Pea [phone]), to provide resources , no answer left HIPPA complaint VM. CSW attempted to call Lajean Saver [phone]) no response left message requesting return call.  CSW spoke with Harmon Dun a staff member form [facility], She reported pt is from independent living. She reported the pt will complain about chest pain and requested to be sent to the hospital. Juliann Pulse stated the facility is not refusing the pt and that she can return to her apartment. CSW informed the facility pt is stable for d/c but will try to contact the family to inform them about her discharge.   Pt is able to ambulate with a walker, pt is not bed-bound. Pt can return back to her independent living facility. CSW spoke with Harmon Dun to inform the facility about pt's return.     ED Mini Assessment: What brought you to the Emergency Department? : anxiety, panic attack  Barriers to Discharge: ED Unable to Make Contact with Facility/Family     Means of departure: Ambulance       Patient Contact and Communications        ,                 Admission diagnosis:  Panic Attack Patient Active Problem List   Diagnosis Date Noted   Acute on chronic heart failure with preserved ejection fraction (HFpEF) ([place]) 02/04/2022   Hypertensive emergency 02/04/2022   Paroxysmal atrial fibrillation ([place]) 02/04/2022   History of CVA (cerebrovascular accident) 02/04/2022   Left breast mass 35/57/3220   Acute metabolic encephalopathy 25/42/7062    UTI (urinary tract infection) 01/01/2022   Altered mental status    Acute diastolic heart failure ([place]) 11/29/2021   Acute coronary syndrome ([place]) 08/30/2019   TIA (transient ischemic attack)    CVA (cerebral vascular accident) ([place]) 05/18/2019   Chronic kidney disease, stage III (moderate) ([place]) 05/18/2019   History of breast cancer 05/18/2019   History of colon cancer 05/18/2019   Hypochromic anemia 05/18/2019   Rectal cancer ([place]) 12/16/2015   Depression 03/07/2014   Hip fracture ([place]) 02/22/2014   Fall 02/21/2014   Fracture of femoral neck, left ([place]) 02/21/2014   Diarrhea 05/07/2012   Acute pulmonary embolism ([place]) 01/04/2012   Acute pericardial effusion 01/04/2012   Fracture of medial malleolus, left, closed, nondisplaced -12/19/2011 12/22/2011   Fracture of left distal radius - 12/19/2011 12/21/2011   Fracture of lateral malleolus of right ankle - 12/19/2011 12/21/2011   Foot fracture, left - 12/19/2011 12/21/2011   Closed fracture of distal clavicle 12/21/2011   PERIPHERAL VASCULAR DISEASE 03/17/2008   GASTRITIS 11/29/2007   ADENOCARCINOMA, BREAST 10/16/2007   VENOUS INSUFFICIENCY 10/16/2007   Hypothyroidism 04/23/2007   ANXIETY 04/23/2007   Essential hypertension 37/62/8315   Diastolic dysfunction, left ventricle as evidenced by moderate LVH on echocardiogram June 2013 04/23/2007   GERD 04/23/2007   PCP:  [name], MD Pharmacy:   [facility] [place] [address]  Phone: [phone] Fax: [phone]

## 2022-03-05 NOTE — ED Notes (Signed)
Patient able to walk independently with a walker.

## 2022-03-06 ENCOUNTER — Inpatient Hospital Stay (HOSPITAL_COMMUNITY)
Admission: EM | Admit: 2022-03-06 | Discharge: 2022-03-10 | DRG: 690 | Disposition: A | Discharge status: Skilled Nursing Facility | Payer: Medicare Other | Source: Skilled Nursing Facility | Attending: Internal Medicine | Admitting: Internal Medicine

## 2022-03-06 ENCOUNTER — Emergency Department (HOSPITAL_COMMUNITY): Payer: Medicare Other

## 2022-03-06 ENCOUNTER — Observation Stay (HOSPITAL_COMMUNITY): Payer: Medicare Other

## 2022-03-06 ENCOUNTER — Other Ambulatory Visit: Payer: Self-pay

## 2022-03-06 DIAGNOSIS — Z853 Personal history of malignant neoplasm of breast: Secondary | ICD-10-CM

## 2022-03-06 DIAGNOSIS — Z8249 Family history of ischemic heart disease and other diseases of the circulatory system: Secondary | ICD-10-CM

## 2022-03-06 DIAGNOSIS — N390 Urinary tract infection, site not specified: Secondary | ICD-10-CM | POA: Diagnosis not present

## 2022-03-06 DIAGNOSIS — K219 Gastro-esophageal reflux disease without esophagitis: Secondary | ICD-10-CM | POA: Diagnosis present

## 2022-03-06 DIAGNOSIS — Z885 Allergy status to narcotic agent status: Secondary | ICD-10-CM

## 2022-03-06 DIAGNOSIS — Z85048 Personal history of other malignant neoplasm of rectum, rectosigmoid junction, and anus: Secondary | ICD-10-CM

## 2022-03-06 DIAGNOSIS — M1711 Unilateral primary osteoarthritis, right knee: Secondary | ICD-10-CM | POA: Diagnosis present

## 2022-03-06 DIAGNOSIS — Z043 Encounter for examination and observation following other accident: Secondary | ICD-10-CM | POA: Diagnosis not present

## 2022-03-06 DIAGNOSIS — I480 Paroxysmal atrial fibrillation: Secondary | ICD-10-CM | POA: Diagnosis present

## 2022-03-06 DIAGNOSIS — I10 Essential (primary) hypertension: Secondary | ICD-10-CM | POA: Diagnosis present

## 2022-03-06 DIAGNOSIS — Z9842 Cataract extraction status, left eye: Secondary | ICD-10-CM

## 2022-03-06 DIAGNOSIS — F0394 Unspecified dementia, unspecified severity, with anxiety: Secondary | ICD-10-CM | POA: Diagnosis present

## 2022-03-06 DIAGNOSIS — I447 Left bundle-branch block, unspecified: Secondary | ICD-10-CM | POA: Diagnosis present

## 2022-03-06 DIAGNOSIS — Z85038 Personal history of other malignant neoplasm of large intestine: Secondary | ICD-10-CM

## 2022-03-06 DIAGNOSIS — Z743 Need for continuous supervision: Secondary | ICD-10-CM | POA: Diagnosis not present

## 2022-03-06 DIAGNOSIS — Z9841 Cataract extraction status, right eye: Secondary | ICD-10-CM

## 2022-03-06 DIAGNOSIS — W19XXXA Unspecified fall, initial encounter: Secondary | ICD-10-CM

## 2022-03-06 DIAGNOSIS — Z66 Do not resuscitate: Secondary | ICD-10-CM | POA: Diagnosis present

## 2022-03-06 DIAGNOSIS — R296 Repeated falls: Secondary | ICD-10-CM | POA: Diagnosis not present

## 2022-03-06 DIAGNOSIS — N1832 Chronic kidney disease, stage 3b: Secondary | ICD-10-CM | POA: Diagnosis present

## 2022-03-06 DIAGNOSIS — S81001A Unspecified open wound, right knee, initial encounter: Secondary | ICD-10-CM | POA: Diagnosis not present

## 2022-03-06 DIAGNOSIS — N6320 Unspecified lump in the left breast, unspecified quadrant: Secondary | ICD-10-CM | POA: Diagnosis present

## 2022-03-06 DIAGNOSIS — Z961 Presence of intraocular lens: Secondary | ICD-10-CM | POA: Diagnosis present

## 2022-03-06 DIAGNOSIS — E8809 Other disorders of plasma-protein metabolism, not elsewhere classified: Secondary | ICD-10-CM | POA: Diagnosis present

## 2022-03-06 DIAGNOSIS — Z8673 Personal history of transient ischemic attack (TIA), and cerebral infarction without residual deficits: Secondary | ICD-10-CM

## 2022-03-06 DIAGNOSIS — R6251 Failure to thrive (child): Secondary | ICD-10-CM

## 2022-03-06 DIAGNOSIS — E039 Hypothyroidism, unspecified: Secondary | ICD-10-CM | POA: Diagnosis present

## 2022-03-06 DIAGNOSIS — I161 Hypertensive emergency: Secondary | ICD-10-CM | POA: Diagnosis present

## 2022-03-06 DIAGNOSIS — Z91148 Patient's other noncompliance with medication regimen for other reason: Secondary | ICD-10-CM

## 2022-03-06 DIAGNOSIS — Z9011 Acquired absence of right breast and nipple: Secondary | ICD-10-CM

## 2022-03-06 DIAGNOSIS — S8991XA Unspecified injury of right lower leg, initial encounter: Secondary | ICD-10-CM

## 2022-03-06 DIAGNOSIS — R531 Weakness: Secondary | ICD-10-CM

## 2022-03-06 DIAGNOSIS — I130 Hypertensive heart and chronic kidney disease with heart failure and stage 1 through stage 4 chronic kidney disease, or unspecified chronic kidney disease: Secondary | ICD-10-CM | POA: Diagnosis present

## 2022-03-06 DIAGNOSIS — I5032 Chronic diastolic (congestive) heart failure: Secondary | ICD-10-CM

## 2022-03-06 DIAGNOSIS — R627 Adult failure to thrive: Secondary | ICD-10-CM | POA: Diagnosis not present

## 2022-03-06 DIAGNOSIS — B965 Pseudomonas (aeruginosa) (mallei) (pseudomallei) as the cause of diseases classified elsewhere: Secondary | ICD-10-CM | POA: Diagnosis present

## 2022-03-06 DIAGNOSIS — N179 Acute kidney failure, unspecified: Secondary | ICD-10-CM | POA: Diagnosis present

## 2022-03-06 DIAGNOSIS — Z888 Allergy status to other drugs, medicaments and biological substances status: Secondary | ICD-10-CM

## 2022-03-06 DIAGNOSIS — Z96642 Presence of left artificial hip joint: Secondary | ICD-10-CM | POA: Diagnosis present

## 2022-03-06 DIAGNOSIS — Z515 Encounter for palliative care: Secondary | ICD-10-CM

## 2022-03-06 DIAGNOSIS — D630 Anemia in neoplastic disease: Secondary | ICD-10-CM | POA: Diagnosis present

## 2022-03-06 DIAGNOSIS — S4982XA Other specified injuries of left shoulder and upper arm, initial encounter: Secondary | ICD-10-CM | POA: Diagnosis not present

## 2022-03-06 DIAGNOSIS — F0390 Unspecified dementia without behavioral disturbance: Secondary | ICD-10-CM | POA: Diagnosis not present

## 2022-03-06 DIAGNOSIS — C50911 Malignant neoplasm of unspecified site of right female breast: Secondary | ICD-10-CM | POA: Diagnosis present

## 2022-03-06 DIAGNOSIS — Z96652 Presence of left artificial knee joint: Secondary | ICD-10-CM | POA: Diagnosis present

## 2022-03-06 DIAGNOSIS — M25561 Pain in right knee: Secondary | ICD-10-CM | POA: Diagnosis not present

## 2022-03-06 DIAGNOSIS — G9389 Other specified disorders of brain: Secondary | ICD-10-CM | POA: Diagnosis not present

## 2022-03-06 DIAGNOSIS — J9811 Atelectasis: Secondary | ICD-10-CM | POA: Diagnosis not present

## 2022-03-06 DIAGNOSIS — E876 Hypokalemia: Secondary | ICD-10-CM | POA: Diagnosis present

## 2022-03-06 DIAGNOSIS — Z79899 Other long term (current) drug therapy: Secondary | ICD-10-CM

## 2022-03-06 DIAGNOSIS — Z90710 Acquired absence of both cervix and uterus: Secondary | ICD-10-CM

## 2022-03-06 DIAGNOSIS — S0990XA Unspecified injury of head, initial encounter: Secondary | ICD-10-CM | POA: Diagnosis not present

## 2022-03-06 DIAGNOSIS — Z7902 Long term (current) use of antithrombotics/antiplatelets: Secondary | ICD-10-CM

## 2022-03-06 DIAGNOSIS — Z8261 Family history of arthritis: Secondary | ICD-10-CM

## 2022-03-06 DIAGNOSIS — I6782 Cerebral ischemia: Secondary | ICD-10-CM | POA: Diagnosis not present

## 2022-03-06 DIAGNOSIS — E860 Dehydration: Secondary | ICD-10-CM | POA: Diagnosis present

## 2022-03-06 DIAGNOSIS — R413 Other amnesia: Secondary | ICD-10-CM

## 2022-03-06 LAB — URINALYSIS, ROUTINE W REFLEX MICROSCOPIC
Bilirubin Urine: NEGATIVE
Glucose, UA: NEGATIVE mg/dL
Ketones, ur: NEGATIVE mg/dL
Nitrite: POSITIVE — AB
Protein, ur: 30 mg/dL — AB
Specific Gravity, Urine: 1.016 (ref 1.005–1.030)
WBC, UA: >50 WBC/hpf — ABNORMAL HIGH (ref 0–5)
pH: 5 (ref 5.0–8.0)

## 2022-03-06 LAB — COMPREHENSIVE METABOLIC PANEL
ALT: 14 U/L (ref 0–44)
AST: 24 U/L (ref 15–41)
Albumin: 3.9 g/dL (ref 3.5–5.0)
Alkaline Phosphatase: 49 U/L (ref 38–126)
Anion gap: 13 (ref 5–15)
BUN: 25 mg/dL — ABNORMAL HIGH (ref 8–23)
CO2: 23 mmol/L (ref 22–32)
Calcium: 10.1 mg/dL (ref 8.9–10.3)
Chloride: 101 mmol/L (ref 98–111)
Creatinine, Ser: 1.66 mg/dL — ABNORMAL HIGH (ref 0.44–1.00)
GFR, Estimated: 28 mL/min — ABNORMAL LOW (ref >60)
Glucose, Bld: 117 mg/dL — ABNORMAL HIGH (ref 70–99)
Potassium: 3.9 mmol/L (ref 3.5–5.1)
Sodium: 137 mmol/L (ref 135–145)
Total Bilirubin: 0.5 mg/dL (ref 0.3–1.2)
Total Protein: 7.1 g/dL (ref 6.5–8.1)

## 2022-03-06 LAB — CBC WITH DIFFERENTIAL/PLATELET
Abs Immature Granulocytes: 0.07 10*3/uL (ref 0.00–0.07)
Basophils Absolute: 0 10*3/uL (ref 0.0–0.1)
Basophils Relative: 0 %
Eosinophils Absolute: 0 10*3/uL (ref 0.0–0.5)
Eosinophils Relative: 0 %
HCT: 41.8 % (ref 36.0–46.0)
Hemoglobin: 12.8 g/dL (ref 12.0–15.0)
Immature Granulocytes: 1 %
Lymphocytes Relative: 19 %
Lymphs Abs: 2.1 10*3/uL (ref 0.7–4.0)
MCH: 26.6 pg (ref 26.0–34.0)
MCHC: 30.6 g/dL (ref 30.0–36.0)
MCV: 86.7 fL (ref 80.0–100.0)
Monocytes Absolute: 1.2 10*3/uL — ABNORMAL HIGH (ref 0.1–1.0)
Monocytes Relative: 11 %
Neutro Abs: 7.5 10*3/uL (ref 1.7–7.7)
Neutrophils Relative %: 69 %
Platelets: 212 10*3/uL (ref 150–400)
RBC: 4.82 MIL/uL (ref 3.87–5.11)
RDW: 17.7 % — ABNORMAL HIGH (ref 11.5–15.5)
WBC: 10.9 10*3/uL — ABNORMAL HIGH (ref 4.0–10.5)
nRBC: 0 % (ref 0.0–0.2)

## 2022-03-06 LAB — TROPONIN I (HIGH SENSITIVITY)
Troponin I (High Sensitivity): 33 ng/L — ABNORMAL HIGH (ref <18)
Troponin I (High Sensitivity): 25 ng/L — ABNORMAL HIGH (ref <18)

## 2022-03-06 LAB — CK: Total CK: 226 U/L (ref 38–234)

## 2022-03-06 LAB — TSH: TSH: 36.432 u[IU]/mL — ABNORMAL HIGH (ref 0.350–4.500)

## 2022-03-06 LAB — T4, FREE: Free T4: 0.66 ng/dL (ref 0.61–1.12)

## 2022-03-06 LAB — VITAMIN B12: Vitamin B-12: 103 pg/mL — ABNORMAL LOW (ref 180–914)

## 2022-03-06 LAB — BRAIN NATRIURETIC PEPTIDE: B Natriuretic Peptide: 668.7 pg/mL — ABNORMAL HIGH (ref 0.0–100.0)

## 2022-03-06 MED ORDER — LEVOTHYROXINE SODIUM 75 MCG PO TABS
75.0000 ug | ORAL_TABLET | Freq: Every day | ORAL | Status: DC
Start: 2022-03-07 — End: 2022-03-10
  Administered 2022-03-07 – 2022-03-10 (×4): 75 ug via ORAL
  Filled 2022-03-06 (×4): qty 1

## 2022-03-06 MED ORDER — CEFTRIAXONE SODIUM 1 G IJ SOLR: 1.0000 g | Freq: Once | INTRAMUSCULAR | 0 refills | Status: DC

## 2022-03-06 MED ORDER — QUETIAPINE FUMARATE 50 MG PO TABS
25.0000 mg | ORAL_TABLET | Freq: Every day | ORAL | Status: DC
  Administered 2022-03-06 – 2022-03-09 (×4): 25 mg via ORAL
  Filled 2022-03-06 (×4): qty 1

## 2022-03-06 MED ORDER — ACETAMINOPHEN 650 MG RE SUPP: 650.0000 mg | Freq: Four times a day (QID) | RECTAL | Status: DC | PRN

## 2022-03-06 MED ORDER — HYDRALAZINE HCL 20 MG/ML IJ SOLN: 5.0000 mg | Freq: Four times a day (QID) | INTRAMUSCULAR | Status: DC | PRN

## 2022-03-06 MED ORDER — AMLODIPINE BESYLATE 5 MG PO TABS
5.0000 mg | ORAL_TABLET | Freq: Every day | ORAL | Status: DC
  Administered 2022-03-07 – 2022-03-10 (×4): 5 mg via ORAL
  Filled 2022-03-06 (×5): qty 1

## 2022-03-06 MED ORDER — CLOPIDOGREL BISULFATE 75 MG PO TABS
75.0000 mg | ORAL_TABLET | Freq: Every day | ORAL | Status: DC
  Administered 2022-03-07 – 2022-03-10 (×4): 75 mg via ORAL
  Filled 2022-03-06 (×4): qty 1

## 2022-03-06 MED ORDER — SODIUM CHLORIDE 0.9 % IV SOLN: INTRAVENOUS | Status: AC

## 2022-03-06 MED ORDER — METOPROLOL TARTRATE 25 MG PO TABS
25.0000 mg | ORAL_TABLET | Freq: Two times a day (BID) | ORAL | Status: DC
  Administered 2022-03-06 – 2022-03-10 (×5): 25 mg via ORAL
  Filled 2022-03-06 (×7): qty 1

## 2022-03-06 MED ORDER — ENOXAPARIN SODIUM 30 MG/0.3ML IJ SOSY: 30.0000 mg | PREFILLED_SYRINGE | INTRAMUSCULAR | Status: DC

## 2022-03-06 MED ORDER — AMIODARONE HCL 200 MG PO TABS
200.0000 mg | ORAL_TABLET | Freq: Every day | ORAL | Status: DC
  Administered 2022-03-07 – 2022-03-10 (×3): 200 mg via ORAL
  Filled 2022-03-06 (×4): qty 1

## 2022-03-06 MED ORDER — PANTOPRAZOLE SODIUM 40 MG PO TBEC
80.0000 mg | DELAYED_RELEASE_TABLET | Freq: Every day | ORAL | Status: DC
  Administered 2022-03-07 – 2022-03-10 (×4): 80 mg via ORAL
  Filled 2022-03-06 (×4): qty 2

## 2022-03-06 MED ORDER — ISOSORBIDE MONONITRATE ER 30 MG PO TB24
30.0000 mg | ORAL_TABLET | Freq: Every morning | ORAL | Status: DC
  Administered 2022-03-07 – 2022-03-10 (×4): 30 mg via ORAL
  Filled 2022-03-06 (×4): qty 1

## 2022-03-06 MED ORDER — ATORVASTATIN CALCIUM 40 MG PO TABS
40.0000 mg | ORAL_TABLET | Freq: Every evening | ORAL | Status: DC
  Administered 2022-03-07 – 2022-03-09 (×3): 40 mg via ORAL
  Filled 2022-03-06 (×3): qty 1

## 2022-03-06 MED ORDER — ACETAMINOPHEN 325 MG PO TABS
650.0000 mg | ORAL_TABLET | Freq: Four times a day (QID) | ORAL | Status: DC | PRN
  Administered 2022-03-06 – 2022-03-10 (×5): 650 mg via ORAL
  Filled 2022-03-06 (×5): qty 2

## 2022-03-06 MED ORDER — ACETAMINOPHEN 325 MG PO TABS
650.0000 mg | ORAL_TABLET | Freq: Once | ORAL | Status: AC
  Administered 2022-03-06: 650 mg via ORAL
  Filled 2022-03-06: qty 2

## 2022-03-06 MED ORDER — SODIUM CHLORIDE 0.9 % IV SOLN
1.0000 g | INTRAVENOUS | Status: DC
  Administered 2022-03-06 – 2022-03-08 (×3): 1 g via INTRAVENOUS
  Filled 2022-03-06 (×3): qty 10

## 2022-03-06 NOTE — Assessment & Plan Note (Addendum)
88 year old with history of worsening memory, FTT, and increased falls and weakness found on the ground after a fall at her independent living -obs to telemetry  -stat head CT for unwitnessed fall  -urine suspicious for UTI with hx of UTI and not taking medication. Start rocephin, f/u on culture -has stopped taking medication and TSH very abnormal in July. Repeat pending. Re start home dose -check B12, CK -AKI on CKD-very gentle, time limited fluids -palliative care consult with decline -PT/OT/SW for placement as does not appear safe to return to [place].  -fall precautions

## 2022-03-06 NOTE — Assessment & Plan Note (Signed)
Elevated, but has not been taking her medication  Start back amlodipine, imdur, lopressor Not taking her hydralazine

## 2022-03-06 NOTE — Assessment & Plan Note (Signed)
Baseline creatinine appears to be around 1.0-1.2 UA suspicious for infection  Culture pending Strict I/O Hold nephrotoxic drugs Gentle, time limited IVF Trend

## 2022-03-06 NOTE — Assessment & Plan Note (Addendum)
No diagnosis of dementia, but daughter states her doctor states she has dementia Daughter states she has had worsening memory loss since may and personality She has stopped taking medication and has poor PO intake Daughter interested in hospice, palliative care consult Delirium precautions

## 2022-03-06 NOTE — Assessment & Plan Note (Signed)
Does not appear clinically overloaded and CXR with no acute edema or LE swelling She has not been taking her medication  Echo 5/23: EF of 60-65%. Grade 2 DD.  Has AKI, hold lasix monitor closely  Strict I/O  Continue lopressor

## 2022-03-06 NOTE — Assessment & Plan Note (Signed)
Concern for malignancy, have opted to not treat Palliative care consult, interested in hospice

## 2022-03-06 NOTE — Assessment & Plan Note (Signed)
With frequent falls/worsening memory loss will treat for UTI Had a urine infection and daughter states she doubt she even took these Rocephin F/u on urine culture

## 2022-03-06 NOTE — ED Notes (Signed)
Patient transported to X-ray 

## 2022-03-06 NOTE — Assessment & Plan Note (Addendum)
TSH 2 months ago was >35 with no repeat Could be contributing to fall Repeat TSH free T4 Adjust medication as needed (she has not been taking this)

## 2022-03-06 NOTE — Assessment & Plan Note (Signed)
Not on [facility] Continue amiodarone, lopressor

## 2022-03-06 NOTE — ED Provider Notes (Cosign Needed Addendum)
[facility] EMERGENCY DEPARTMENT Provider Note   CSN: [id number] Arrival date & time: 03/06/22  1140     History  Chief Complaint  Patient presents with   Lytle Michaels    [name] is a 88 y.o. female.  Pt brought in by EMS.  Pt was found on the floor today near bed.  Pt complains of pain in her knee.  Fall was unwitnessed.  Pt unable to tell me what happened.    The history is provided by the EMS personnel. No language interpreter was used.  Fall This is a new problem. The problem occurs hourly. The problem has been gradually worsening. Nothing aggravates the symptoms. Nothing relieves the symptoms. She has tried nothing for the symptoms.       Home Medications Prior to Admission medications   Medication Sig Start Date End Date Taking? Authorizing Provider  acetaminophen (TYLENOL) 500 MG tablet Take 1,000 mg by mouth every 6 (six) hours as needed for headache (pain).    [provider]  amiodarone (PACERONE) 200 MG tablet Take 200 mg by mouth daily at 12 noon. 12/11/21   [provider]  amLODipine (NORVASC) 5 MG tablet Take 5 mg by mouth daily. 02/07/22   [provider]  atorvastatin (LIPITOR) 40 MG tablet Take 1 tablet (40 mg total) by mouth daily at 6 PM. Patient taking differently: Take 40 mg by mouth every evening. 09/02/19   Dixie Dials, MD  cephALEXin (KEFLEX) 500 MG capsule Take 1 capsule (500 mg total) by mouth 3 (three) times daily. 02/26/22   Schutt, Grafton Folk, PA-C  clopidogrel (PLAVIX) 75 MG tablet Take 1 tablet (75 mg total) by mouth daily. Patient taking differently: Take 75 mg by mouth daily at 12 noon. 09/02/19   Dixie Dials, MD  diclofenac sodium (VOLTAREN) 1 % GEL APPLY 2-4 GRAMS TO AFFECTED AREA 2-3 TIMES A DAY AS NEEDED. Patient taking differently: Apply 1 Application topically 2 (two) times daily as needed (joint pain). 04/30/19   Meredith Pel, MD  esomeprazole (NEXIUM) 40 MG capsule Take 40 mg by mouth  every morning.    [provider]  furosemide (LASIX) 40 MG tablet Take 0.5 tablets (20 mg total) by mouth daily. Patient taking differently: Take 40 mg by mouth daily. 02/07/22   Domenic Polite, MD  hydrALAZINE (APRESOLINE) 10 MG tablet Take 1 tablet (10 mg total) by mouth 2 (two) times daily. 12/02/21   Dixie Dials, MD  isosorbide mononitrate (IMDUR) 30 MG 24 hr tablet Take 30 mg by mouth every morning. 02/07/22   [provider]  levothyroxine (SYNTHROID) 75 MCG tablet Take 1 tablet (75 mcg total) by mouth daily. Patient taking differently: Take 75 mcg by mouth every morning. 01/03/22   Pokhrel, Corrie Mckusick, MD  losartan (COZAAR) 25 MG tablet Take 25 mg by mouth daily. 02/07/22   [provider]  Melatonin 3 MG TABS Take 3 mg by mouth at bedtime as needed (sleep).     [provider]  metoprolol tartrate (LOPRESSOR) 25 MG tablet Take 25 mg by mouth See admin instructions. Take one tablet (25 mg) by mouth twice daily - noon and evening 12/11/21   [provider]  Multiple Vitamins-Minerals (MULTIVITAMIN ADULTS 50+) TABS Take 1 tablet by mouth daily.    [provider]  nitroGLYCERIN (NITROSTAT) 0.4 MG SL tablet Place 1 tablet (0.4 mg total) under the tongue every 5 (five) minutes x 3 doses as needed for chest pain. 09/02/19  Dixie Dials, MD  Polyethyl Glycol-Propyl Glycol (SYSTANE) 0.4-0.3 % GEL ophthalmic gel Place 1 application into both eyes at bedtime.    [provider]  potassium chloride SA (KLOR-CON M20) 20 MEQ tablet Take 1 tablet (20 mEq total) by mouth every Monday, Wednesday, and Friday. Patient taking differently: Take 20 mEq by mouth daily at 12 noon. 12/03/21   Dixie Dials, MD  sucralfate (CARAFATE) 1 g tablet Take 1 tablet (1 g total) by mouth in the morning, at noon, and at bedtime for 7 doses. 02/25/22 02/28/22  Jeanell Sparrow, DO      Allergies    Codeine and Pregabalin    Review of Systems   Review of Systems  All other  systems reviewed and are negative.   Physical Exam Updated Vital Signs BP (!) 180/85   Pulse 63   Temp (!) 97.1 F (36.2 C) (Oral)   Resp 19   Ht '5\' 7"'$  (1.702 m)   Wt 70 kg   SpO2 98%   BMI 24.17 kg/m  Physical Exam Vitals and nursing note reviewed.  Constitutional:      Appearance: She is well-developed.  HENT:     Head: Normocephalic.     Right Ear: External ear normal.     Left Ear: External ear normal.     Nose: Nose normal.     Mouth/Throat:     Mouth: Mucous membranes are moist.  Eyes:     Pupils: Pupils are equal, round, and reactive to light.  Cardiovascular:     Rate and Rhythm: Normal rate and regular rhythm.  Pulmonary:     Effort: Pulmonary effort is normal.  Abdominal:     General: Abdomen is flat. There is no distension.  Musculoskeletal:        General: Normal range of motion.     Cervical back: Normal range of motion.  Skin:    General: Skin is warm.  Neurological:     General: No focal deficit present.     Mental Status: She is alert.  Psychiatric:        Mood and Affect: Mood normal.     ED Results / Procedures / Treatments   Labs (all labs ordered are listed, but only abnormal results are displayed) Labs Reviewed  CBC WITH DIFFERENTIAL/PLATELET - Abnormal; Notable for the following components:      Result Value   WBC 10.9 (*)    RDW 17.7 (*)    Monocytes Absolute 1.2 (*)    All other components within normal limits  COMPREHENSIVE METABOLIC PANEL - Abnormal; Notable for the following components:   Glucose, Bld 117 (*)    BUN 25 (*)    Creatinine, Ser 1.66 (*)    GFR, Estimated 28 (*)    All other components within normal limits  TROPONIN I (HIGH SENSITIVITY) - Abnormal; Notable for the following components:   Troponin I (High Sensitivity) 33 (*)    All other components within normal limits  URINALYSIS, ROUTINE W REFLEX MICROSCOPIC  BRAIN NATRIURETIC PEPTIDE  TROPONIN I (HIGH SENSITIVITY)    EKG EKG  Interpretation  Date/Time:  Sunday March 06 2022 12:43:20 EDT Ventricular Rate:  62 PR Interval:  194 QRS Duration: 184 QT Interval:  508 QTC Calculation: 516 R Axis:   -55 Text Interpretation: Sinus or ectopic atrial rhythm Probable left atrial enlargement Left bundle branch block No significant change since last tracing Confirmed by Dorie Rank [phone]) on 03/06/2022 12:47:16 PM  Radiology DG Knee  Complete 4 Views Right  Result Date: 03/06/2022 CLINICAL DATA:  Right knee pain after fall. EXAM: RIGHT KNEE - COMPLETE 4+ VIEW COMPARISON:  None Available. FINDINGS: No evidence of fracture, dislocation, or joint effusion. Severe narrowing of medial joint space is noted. Chondrocalcinosis is noted laterally. Soft tissues are unremarkable. IMPRESSION: Severe degenerative joint disease.  No acute abnormality seen. Electronically Signed   By: Marijo Conception M.D.   On: 03/06/2022 13:36   DG Chest Port 1 View  Result Date: 03/06/2022 CLINICAL DATA:  Fall. EXAM: PORTABLE CHEST 1 VIEW COMPARISON:  March 04, 2022. FINDINGS: Stable cardiomegaly. Mild bibasilar subsegmental atelectasis or scarring is noted. Old right clavicular fracture. IMPRESSION: Mild bibasilar subsegmental atelectasis or scarring. Aortic Atherosclerosis (ICD10-I70.0). Electronically Signed   By: Marijo Conception M.D.   On: 03/06/2022 13:34   DG Pelvis 1-2 Views  Result Date: 03/06/2022 CLINICAL DATA:  Fall. EXAM: PELVIS - 1-2 VIEW COMPARISON:  February 22, 2014. FINDINGS: There is no evidence of pelvic fracture or diastasis. Status post left hip arthroplasty. No pelvic bone lesions are seen. IMPRESSION: No acute abnormality is noted. Electronically Signed   By: Marijo Conception M.D.   On: 03/06/2022 13:33    Procedures Procedures    Medications Ordered in ED Medications - No data to display  ED Course/ Medical Decision Making/ A&P                           Medical Decision Making Pt found in the floor at independent living  facility   Amount and/or Complexity of Data Reviewed Independent Historian: EMS    Details: EMS reports pt is poor historian  External Data Reviewed: notes.    Details: Previous notes reviewed  Labs: ordered. Decision-making details documented in ED Course.    Details: BUn and creatine  elevated but at baseline  Radiology: ordered and independent interpretation performed. Decision-making details documented in ED Course.    Details: Right knee no fracture Chest portable   no acute   Risk OTC drugs. Prescription drug management.           Final Clinical Impression(s) / ED Diagnoses Final diagnoses:  Fall, initial encounter  Knee injury, right, initial encounter  Failure to thrive (child)  Multiple falls  Weakness  Urinary tract infection without hematuria, site unspecified    Rx / DC Orders ED Discharge Orders     None         Sidney Ace 03/06/22 1548    Sidney Ace 03/06/22 1549    Dorie Rank, MD 03/07/22 [phone]

## 2022-03-06 NOTE — ED Notes (Signed)
Pt given turkey sandwich and water
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
## 2022-03-06 NOTE — Assessment & Plan Note (Addendum)
Continue nexium 

## 2022-03-06 NOTE — ED Triage Notes (Signed)
Pt bib PAT from [place] for fall. Pt was d/c from [place] yesterday afternoon back home. Pt fell sometime between last night and this morning. Unknown downtime, unknown if pt hit head or LOC, found supine on ground. On Plavix. Pt c/o pain on the Lt side of neck, Lt shoulder. Pt was found by facility. Pt has an aid that comes and helps her for an hour a day. Pt is unable to feed self and ambulate. Pt has dementia, A&Ox2-3 at baseline. Currently pt A&Ox1.   EMS vitals 144/90 BP 99% O2 70 HR 149 CBG

## 2022-03-06 NOTE — Assessment & Plan Note (Addendum)
Continue plavix/statin  plavix to start tomorrow after head CT

## 2022-03-07 DIAGNOSIS — R413 Other amnesia: Secondary | ICD-10-CM | POA: Diagnosis not present

## 2022-03-07 DIAGNOSIS — N1832 Chronic kidney disease, stage 3b: Secondary | ICD-10-CM | POA: Diagnosis not present

## 2022-03-07 DIAGNOSIS — C50911 Malignant neoplasm of unspecified site of right female breast: Secondary | ICD-10-CM | POA: Diagnosis not present

## 2022-03-07 DIAGNOSIS — E860 Dehydration: Secondary | ICD-10-CM | POA: Diagnosis not present

## 2022-03-07 DIAGNOSIS — Z8673 Personal history of transient ischemic attack (TIA), and cerebral infarction without residual deficits: Secondary | ICD-10-CM

## 2022-03-07 DIAGNOSIS — E039 Hypothyroidism, unspecified: Secondary | ICD-10-CM

## 2022-03-07 DIAGNOSIS — I10 Essential (primary) hypertension: Secondary | ICD-10-CM

## 2022-03-07 DIAGNOSIS — Z66 Do not resuscitate: Secondary | ICD-10-CM | POA: Diagnosis not present

## 2022-03-07 DIAGNOSIS — Z515 Encounter for palliative care: Secondary | ICD-10-CM | POA: Diagnosis not present

## 2022-03-07 DIAGNOSIS — R6251 Failure to thrive (child): Secondary | ICD-10-CM

## 2022-03-07 DIAGNOSIS — N6320 Unspecified lump in the left breast, unspecified quadrant: Secondary | ICD-10-CM

## 2022-03-07 DIAGNOSIS — B965 Pseudomonas (aeruginosa) (mallei) (pseudomallei) as the cause of diseases classified elsewhere: Secondary | ICD-10-CM | POA: Diagnosis not present

## 2022-03-07 DIAGNOSIS — E8809 Other disorders of plasma-protein metabolism, not elsewhere classified: Secondary | ICD-10-CM | POA: Diagnosis not present

## 2022-03-07 DIAGNOSIS — I480 Paroxysmal atrial fibrillation: Secondary | ICD-10-CM

## 2022-03-07 DIAGNOSIS — R627 Adult failure to thrive: Secondary | ICD-10-CM | POA: Diagnosis not present

## 2022-03-07 DIAGNOSIS — Z7189 Other specified counseling: Secondary | ICD-10-CM

## 2022-03-07 DIAGNOSIS — N390 Urinary tract infection, site not specified: Secondary | ICD-10-CM | POA: Diagnosis not present

## 2022-03-07 DIAGNOSIS — R296 Repeated falls: Secondary | ICD-10-CM | POA: Diagnosis not present

## 2022-03-07 DIAGNOSIS — N3000 Acute cystitis without hematuria: Secondary | ICD-10-CM

## 2022-03-07 DIAGNOSIS — N179 Acute kidney failure, unspecified: Secondary | ICD-10-CM

## 2022-03-07 DIAGNOSIS — K219 Gastro-esophageal reflux disease without esophagitis: Secondary | ICD-10-CM | POA: Diagnosis not present

## 2022-03-07 DIAGNOSIS — I5032 Chronic diastolic (congestive) heart failure: Secondary | ICD-10-CM | POA: Diagnosis not present

## 2022-03-07 DIAGNOSIS — I130 Hypertensive heart and chronic kidney disease with heart failure and stage 1 through stage 4 chronic kidney disease, or unspecified chronic kidney disease: Secondary | ICD-10-CM | POA: Diagnosis not present

## 2022-03-07 LAB — CBC
HCT: 40.5 % (ref 36.0–46.0)
Hemoglobin: 12.5 g/dL (ref 12.0–15.0)
MCH: 26.5 pg (ref 26.0–34.0)
MCHC: 30.9 g/dL (ref 30.0–36.0)
MCV: 86 fL (ref 80.0–100.0)
Platelets: 210 10*3/uL (ref 150–400)
RBC: 4.71 MIL/uL (ref 3.87–5.11)
RDW: 17.3 % — ABNORMAL HIGH (ref 11.5–15.5)
WBC: 8.1 10*3/uL (ref 4.0–10.5)
nRBC: 0 % (ref 0.0–0.2)

## 2022-03-07 LAB — BASIC METABOLIC PANEL
Anion gap: 11 (ref 5–15)
BUN: 20 mg/dL (ref 8–23)
CO2: 24 mmol/L (ref 22–32)
Calcium: 9.5 mg/dL (ref 8.9–10.3)
Chloride: 100 mmol/L (ref 98–111)
Creatinine, Ser: 1.14 mg/dL — ABNORMAL HIGH (ref 0.44–1.00)
GFR, Estimated: 45 mL/min — ABNORMAL LOW (ref >60)
Glucose, Bld: 95 mg/dL (ref 70–99)
Potassium: 3.3 mmol/L — ABNORMAL LOW (ref 3.5–5.1)
Sodium: 135 mmol/L (ref 135–145)

## 2022-03-07 MED ORDER — POTASSIUM CHLORIDE 10 MEQ/100ML IV SOLN
10.0000 meq | INTRAVENOUS | Status: DC
  Administered 2022-03-07: 10 meq via INTRAVENOUS
  Filled 2022-03-07: qty 100

## 2022-03-07 MED ORDER — VITAMIN B-12 1000 MCG PO TABS
1000.0000 ug | ORAL_TABLET | Freq: Every day | ORAL | Status: DC
Start: 2022-03-08 — End: 2022-03-10
  Administered 2022-03-08 – 2022-03-10 (×3): 1000 ug via ORAL
  Filled 2022-03-07 (×3): qty 1

## 2022-03-07 MED ORDER — CYANOCOBALAMIN 1000 MCG/ML IJ SOLN
1000.0000 ug | Freq: Once | INTRAMUSCULAR | Status: AC
Start: 2022-03-07 — End: 2022-03-07
  Administered 2022-03-07: 1000 ug via INTRAMUSCULAR
  Filled 2022-03-07: qty 1

## 2022-03-07 MED ORDER — POTASSIUM CHLORIDE CRYS ER 20 MEQ PO TBCR
40.0000 meq | EXTENDED_RELEASE_TABLET | Freq: Two times a day (BID) | ORAL | Status: AC
Start: 2022-03-07 — End: 2022-03-07
  Administered 2022-03-07 (×2): 40 meq via ORAL
  Filled 2022-03-07 (×2): qty 2

## 2022-03-07 NOTE — Evaluation (Signed)
Occupational Therapy Evaluation Patient Details Name: [name] MRN: [id number] DOB: October 13, 1924 Today's Date: 03/07/2022   History of Present Illness [name] is a 88 y.o. female with medical history significant of  colon cancer, right breast cancer s/p mastectomy, recently found left breast mass concerning for cancer but not pursuing treatment, CHF, GERD, HTN, hypothyroidism, hx of CVA, PAF on no anticoagulation, Ckd stage 3 who has had recurrent falls. She fell today and EMS was called. She has had increasing weakness since her admit in 02/04/22. She was admitted for acute on chronic CHF and HTN emergency. Staff at her [place] found her on the ground. Daughter does not think she was on the ground for long. Patient can give me no history and doesn't know whey she is here. She also has had poor PO intake.   Clinical Impression   Pt reports independence at baseline with ADLs, states she uses rollator for mobility. Pt needing min-mod A for ADLs, mod A for bed mobility, and min A for transfers with RW. Pt with decreased cognition, needing increased cues to follow 2 step sequence during session. Pt presenting with impairments listed below, will follow acutely. Recommend SNF at d/c, if unable will need HHOT and 24/7 supervision/assist at [place].      Recommendations for follow up therapy are one component of a multi-disciplinary discharge planning process, led by the attending physician.  Recommendations may be updated based on patient status, additional functional criteria and insurance authorization.   Follow Up Recommendations  Skilled nursing-short term rehab (<3 hours/day)    Assistance Recommended at Discharge Frequent or constant Supervision/Assistance  Patient can return home with the following A little help with walking and/or transfers;A lot of help with bathing/dressing/bathroom;Assistance with cooking/housework;Direct supervision/assist for medications management;Direct supervision/assist  for financial management;Assist for transportation;Help with stairs or ramp for entrance    Functional Status Assessment  Patient has had a recent decline in their functional status and demonstrates the ability to make significant improvements in function in a reasonable and predictable amount of time.  Equipment Recommendations  None recommended by OT (defer)    Recommendations for Other Services PT consult     Precautions / Restrictions Precautions Precautions: Fall Restrictions Weight Bearing Restrictions: No      Mobility Bed Mobility Overal bed mobility: Needs Assistance Bed Mobility: Supine to Sit     Supine to sit: Mod assist     General bed mobility comments: Cues to initiate moving LEs to EOB; slow moving; Mod assist to help trunk up to fully sitting; cues to scoot to EOB and get feet to the floor    Transfers Overall transfer level: Needs assistance Equipment used: Rolling walker (2 wheels) Transfers: Sit to/from Stand Sit to Stand: Min assist           General transfer comment: Min assist to rise and steady      Balance Overall balance assessment: Needs assistance Sitting-balance support: Bilateral upper extremity supported, Feet unsupported Sitting balance-Leahy Scale: Fair       Standing balance-Leahy Scale: Poor Standing balance comment: needing unilateral support at minimum in standing                           ADL either performed or assessed with clinical judgement   ADL Overall ADL's : Needs assistance/impaired Eating/Feeding: Supervision/ safety   Grooming: Supervision/safety   Upper Body Bathing: Minimal assistance   Lower Body Bathing: Moderate assistance  Upper Body Dressing : Minimal assistance   Lower Body Dressing: Moderate assistance   Toilet Transfer: Minimal assistance;Rolling walker (2 wheels)   Toileting- Clothing Manipulation and Hygiene: Supervision/safety       Functional mobility during ADLs:  Minimal assistance;Rolling walker (2 wheels)       Vision   Vision Assessment?: No apparent visual deficits     Perception     Praxis      Pertinent Vitals/Pain Pain Assessment Pain Assessment: Faces Pain Score: 6  Faces Pain Scale: Hurts even more Pain Location: LLE with initail movement in the bed Pain Descriptors / Indicators: Grimacing Pain Intervention(s): Limited activity within patient's tolerance, Monitored during session, Repositioned     Hand Dominance Right   Extremity/Trunk Assessment Upper Extremity Assessment Upper Extremity Assessment: Generalized weakness   Lower Extremity Assessment Lower Extremity Assessment: Defer to PT evaluation   Cervical / Trunk Assessment Cervical / Trunk Assessment: Normal   Communication Communication Communication: HOH   Cognition Arousal/Alertness: Awake/alert Behavior During Therapy: WFL for tasks assessed/performed Overall Cognitive Status: No family/caregiver present to determine baseline cognitive functioning Area of Impairment: Orientation, Memory, Awareness, Problem solving                 Orientation Level: Time, Situation   Memory: Decreased short-term memory     Awareness: Emergent Problem Solving: Slow processing General Comments: Required cues/reminders of the plan to walk to the sink and take care of her dentures to get ready for breakfast     General Comments  HR 72 with ambulation to sink    Exercises     Shoulder Instructions      Home Living Family/patient expects to be discharged to:: Other (Comment) (ILF - [facility])                             Home Equipment: Rollator (4 wheels)   Additional Comments: Lives at [facility] on third floor with elevator; enjoys reading; likes mysteries      Prior Functioning/Environment Prior Level of Function : Independent/Modified Independent;History of Falls (last six months)             Mobility Comments: Uses rollator  without physical assistance. ADLs Comments: Pt independent with bathing, dressing, feeding, and managing medications. Does not cook or drive.        OT Problem List: Decreased strength;Decreased range of motion;Decreased activity tolerance;Impaired balance (sitting and/or standing)      OT Treatment/Interventions: Self-care/ADL training;Therapeutic exercise;DME and/or AE instruction;Therapeutic activities;Balance training;Patient/family education    OT Goals(Current goals can be found in the care plan section) Acute Rehab OT Goals Patient Stated Goal: none stated OT Goal Formulation: With patient Time For Goal Achievement: 03/21/22 Potential to Achieve Goals: Good ADL Goals Pt Will Perform Upper Body Dressing: with modified independence;sitting Pt Will Perform Lower Body Dressing: sitting/lateral leans;with min assist Pt Will Transfer to Toilet: with supervision;ambulating;regular height toilet Additional ADL Goal #1: pt will complete bed mobility with supervision in prep for ADLs  OT Frequency: Min 2X/week    Co-evaluation   Reason for Co-Treatment: Complexity of the patient's impairments (multi-system involvement);To address functional/ADL transfers          AM-PAC OT "6 Clicks" Daily Activity     Outcome Measure Help from another person eating meals?: A Little Help from another person taking care of personal grooming?: A Little Help from another person toileting, which includes using toliet, bedpan, or urinal?: A  Lot Help from another person bathing (including washing, rinsing, drying)?: A Lot Help from another person to put on and taking off regular upper body clothing?: A Little Help from another person to put on and taking off regular lower body clothing?: A Lot 6 Click Score: 15   End of Session Equipment Utilized During Treatment: Gait belt;Rolling walker (2 wheels) Nurse Communication: Mobility status  Activity Tolerance: Patient tolerated treatment well Patient  left: in chair;with call bell/phone within reach;with chair alarm set  OT Visit Diagnosis: Unsteadiness on feet (R26.81);Other abnormalities of gait and mobility (R26.89);Muscle weakness (generalized) (M62.81)                Time: 8469-6295 OT Time Calculation (min): 24 min Charges:  OT General Charges $OT Visit: 1 Visit OT Evaluation $OT Eval Moderate Complexity: 1 [address] OTD, OTR/L Acute Rehab [phone] - [place] 03/07/2022, 12:03 PM

## 2022-03-07 NOTE — Progress Notes (Signed)
PROGRESS NOTE    [name]  OZD:[id number] DOB: 1925-01-17 DOA: 03/06/2022 PCP: [name], MD   Brief Narrative:  HPI per Dr. Orma Flaming on 03/06/22 [name] is a 88 y.o. female with medical history significant of  colon cancer, right breast cancer s/p mastectomy, recently found left breast mass concerning for cancer but not pursuing treatment, CHF, GERD, HTN, hypothyroidism, hx of CVA, PAF on no anticoagulation, Ckd stage 3 who has had recurrent falls. She fell today and EMS was called. She has had increasing weakness since her admit in 02/04/22. She was admitted for acute on chronic CHF and HTN emergency. Staff at her [facility] found her on the ground. Daughter does not think she was on the ground for long. Patient can give me no history and doesn't know whey she is here. She also has had poor PO intake.      He has been feeling good. Denies any fever/chills, vision changes/headaches, chest pain or palpitations, + shortness of breath, but no cough, abdominal pain, N/V/D, dysuria or leg swelling.   **Interim History  Palliative meeting with the patient's daughter and the patient today.  Unfortunately the urine culture still has not been collected and she has been on antibiotics making the urine culture essentially useless now.  We will continue empiric treatment of her suspected UTI and continue for at least 5 days  Assessment and Plan: * Frequent falls with increased weakness -The patient is a 88 year old with history of worsening memory, FTT, and increased falls and weakness found on the ground after a fall at her independent living -Admitted to Obs Telemetry  -Stat head CT for unwitnessed fall done and showed "No evidence of acute intracranial abnormality. 2. Chronic small vessel ischemic disease and cerebral atrophy" -Urine suspicious for UTI with hx of UTI and not taking medication.  Urinalysis showed a cloudy appearance with small hemoglobin, large leukocytes, positive nitrites,  rare bacteria, 6-10 non-squamous epithelial cells, 11-20 RBCs per high-power field, 11-20 squamous epithelial cells and greater than 50 WBCs with unfortunately no urine culture obtained -She was initiated on IV Ceftriaxone, f/u on culture -Has stopped taking medication and TSH very abnormal in July. Repeat and was elevated again at 36.432. Re start home dose -Check B12 and was 103 and will need supplementation, CK was 226 -AKI on CKD-very gentle, time limited fluids; patient's BUN/creatinine went from 25/1.66 and is now 20/1.14 -Palliative care consulted awaiting her to appointment -PT/OT/SW for placement as does not appear safe to return to [facility].  PT OT recommending SNF -Continue Fall precautions   UTI (urinary tract infection) -With frequent falls/worsening memory loss will treat for UTI -Had a urine infection and daughter states she doubt she even took these -See above and continue IV Abx -F/u on urine culture   Acute renal failure superimposed on stage 3b chronic kidney disease (HCC) -Baseline creatinine appears to be around 1.0-1.2 -UA suspicious for infection  -Culture not obtained currently -Strict I/O -Hold nephrotoxic medications, contrast dyes, hypotension and dehydration to ensure adequate renal perfusion and will need to renally dose medications -Was given IV fluids which are now stopped -To monitor trend renal function carefully repeat CMP in a.m.  Paroxysmal atrial fibrillation (HCC) -Not on AC -Continue Amiodarone and metoprolol -Continue monitor on telemetry  Hypothyroidism -TSH 2 months ago was >35 with no repeat -Could be contributing to fall -Repeat TSH free T4 and was 36.42 with a free T4 of 0.66 respectively -Adjust medication as needed (  she has not been taking this)  -Continue levothyroxine 75 mcg p.o. daily for now  Chronic diastolic CHF (congestive heart failure) ([place]) -Does not appear clinically overloaded and CXR with no acute edema or LE swelling -She  has not been taking her medication  -Echo 5/23: EF of 60-65%. Grade 2 DD.  -Has AKI, hold lasix monitor closely  -Strict I/O  -Continue with isosorbide mononitrate 30 minutes p.o. daily as well as metoprolol tartrate 25 mg p.o. twice daily  Essential Hypertension -Elevated, but has not been taking her medication  -Start back amlodipine, imdur, lopressor -Not taking her hydralazine  -Continue to monitor blood pressures per protocol -Last blood pressure reading was  Hypokalemia -Patient's potassium was 3.3 -Unable to tolerate IV potassium chloride -Replete with p.o. KCl 40 mg twice daily x2 doses -check magnesium level in a.m. -Continue to monitor replete as necessary repeat CMP in a.m.  GERD/GI prophylaxis Continue Nexium substitution with pantoprazole 80 mg p.o. daily  History of CVA (cerebrovascular accident) -Continue plavix/statin  -Head CT was done and resuming clopidogrel 75 mg p.o. daily as well as atorvastatin 40 g p.o. every evening  Memory Loss -No diagnosis of dementia, but daughter states her doctor states she has dementia -Daughter states she has had worsening memory loss since may and personality -She has stopped taking medication and has poor PO intake -Daughter interested in hospice, palliative care consult -Delirium precautions -[name] daughter met with palliative care and she is completely on board with hospice philosophy, however she can't pay for 24/7 caregivers or LTC/ALF and can't bring her home from ILF as she has no help.  -She is agreeable to SNF for rehab with palliative; Per discussions would then transition to hospice rather than return to the hospital if (when) she declines from there.   Left breast mass -Concern for malignancy, have opted to not treat -Palliative care consult, interested in hospice   DVT prophylaxis: Place and maintain sequential compression device Start: 03/06/22 1813    Code Status: DNR Family Communication: No family  currently at bedside  Disposition Plan:  Level of care: Telemetry Medical Status is: Observation The patient remains OBS appropriate and will d/c before 2 midnights.   Consultants:  Palliative care medicine  Procedures:  None  Antimicrobials:  Anti-infectives (From admission, onward)    Start     Dose/Rate Route Frequency Ordered Stop   03/06/22 1600  cefTRIAXone (ROCEPHIN) 1 g in sodium chloride 0.9 % 100 mL IVPB        1 g 200 mL/hr over 30 Minutes Intravenous Every 24 hours 03/06/22 1552     03/06/22 0000  cefTRIAXone (ROCEPHIN) 1 g injection        1 g Intramuscular  Once 03/06/22 1535 03/06/22 2359       Subjective: Seen and examined at bedside she is pleasantly confused but denies any complaints or any pain.  No nausea or vomiting.  Felt okay.  No other concerns or complaints at this time and PT OT recommending SNF.  Objective: Vitals:   03/06/22 1957 03/06/22 2114 03/07/22 0658 03/07/22 0837  BP:  (!) 140/71 (!) 152/82 (!) 154/64  Pulse:  60 64 (!) 55  Resp:  '18 16 17  ' Temp: 98 F (36.7 C) (!) 97.5 F (36.4 C) (!) 97.5 F (36.4 C) 98 F (36.7 C)  TempSrc: Oral Oral Oral Oral  SpO2:  98% 97% 97%  Weight:      Height:  Intake/Output Summary (Last 24 hours) at 03/07/2022 1438 Last data filed at 03/07/2022 1154 Gross per 24 hour  Intake 450 ml  Output 600 ml  Net -150 ml   Filed Weights   03/06/22 1201  Weight: 70 kg   Examination: Physical Exam:  Constitutional: Thin elderly Caucasian female currently no acute distress appears calm Respiratory: Slightly diminished to auscultation bilaterally, no wheezing, rales, rhonchi or crackles. Normal respiratory effort and patient is not tachypenic. No accessory muscle use.  Unlabored breathing and not wearing supplemental oxygen nasal cannula Cardiovascular: RRR, no murmurs / rubs / gallops. S1 and S2 auscultated. No extremity edema. Abdomen: Soft, non-tender, non-distended.  Bowel sounds positive.  GU:  Deferred. Musculoskeletal: No clubbing / cyanosis of digits/nails. No joint deformity upper and lower extremities.  Skin: No rashes, lesions, ulcers on limited skin evaluation. No induration; Warm and dry.  Neurologic: CN 2-12 grossly intact with no focal deficits. Romberg sign and cerebellar reflexes not assessed.  Psychiatric: Normal judgment and insight. Alert and oriented x 3. Normal mood and appropriate affect.   Data Reviewed: I have personally reviewed following labs and imaging studies  CBC: Recent Labs  Lab 03/04/22 1200 03/06/22 1248 03/07/22 0019  WBC 6.0 10.9* 8.1  NEUTROABS  --  7.5  --   HGB 13.0 12.8 12.5  HCT 42.0 41.8 40.5  MCV 85.9 86.7 86.0  PLT 241 212 375   Basic Metabolic Panel: Recent Labs  Lab 03/04/22 1200 03/06/22 1248 03/07/22 0019  NA 136 137 135  K 4.1 3.9 3.3*  CL 104 101 100  CO2 '25 23 24  ' GLUCOSE 108* 117* 95  BUN 20 25* 20  CREATININE 1.09* 1.66* 1.14*  CALCIUM 9.8 10.1 9.5   GFR: Estimated Creatinine Clearance: 29.3 mL/min (A) (by C-G formula based on SCr of 1.14 mg/dL (H)). Liver Function Tests: Recent Labs  Lab 03/06/22 1248  AST 24  ALT 14  ALKPHOS 49  BILITOT 0.5  PROT 7.1  ALBUMIN 3.9   No results for input(s): "LIPASE", "AMYLASE" in the last 168 hours. No results for input(s): "AMMONIA" in the last 168 hours. Coagulation Profile: No results for input(s): "INR", "PROTIME" in the last 168 hours. Cardiac Enzymes: Recent Labs  Lab 03/06/22 2146  CKTOTAL 226   BNP (last 3 results) No results for input(s): "PROBNP" in the last 8760 hours. HbA1C: No results for input(s): "HGBA1C" in the last 72 hours. CBG: No results for input(s): "GLUCAP" in the last 168 hours. Lipid Profile: No results for input(s): "CHOL", "HDL", "LDLCALC", "TRIG", "CHOLHDL", "LDLDIRECT" in the last 72 hours. Thyroid Function Tests: Recent Labs    03/06/22 2146  TSH 36.432*  FREET4 0.66   Anemia Panel: Recent Labs    03/06/22 2146   VITAMINB12 103*   Sepsis Labs: No results for input(s): "PROCALCITON", "LATICACIDVEN" in the last 168 hours.  Recent Results (from the past 240 hour(s))  Resp Panel by RT-PCR (Flu A&B, Covid) Anterior Nasal Swab     Status: None   Collection Time: 02/26/22  5:09 PM   Specimen: Anterior Nasal Swab  Result Value Ref Range Status   SARS Coronavirus 2 by RT PCR NEGATIVE NEGATIVE Final    Comment: (NOTE) SARS-CoV-2 target nucleic acids are NOT DETECTED.  The SARS-CoV-2 RNA is generally detectable in upper respiratory specimens during the acute phase of infection. The lowest concentration of SARS-CoV-2 viral copies this assay can detect is 138 copies/mL. A negative result does not preclude SARS-Cov-2 infection and should not  be used as the sole basis for treatment or other patient management decisions. A negative result may occur with  improper specimen collection/handling, submission of specimen other than nasopharyngeal swab, presence of viral mutation(s) within the areas targeted by this assay, and inadequate number of viral copies(<138 copies/mL). A negative result must be combined with clinical observations, patient history, and epidemiological information. The expected result is Negative.  Fact Sheet for Patients:  EntrepreneurPulse.com.au  Fact Sheet for Healthcare Providers:  IncredibleEmployment.be  This test is no t yet approved or cleared by the [place] FDA and  has been authorized for detection and/or diagnosis of SARS-CoV-2 by FDA under an Emergency Use Authorization (EUA). This EUA will remain  in effect (meaning this test can be used) for the duration of the COVID-19 declaration under Section 564(b)(1) of the Act, 21 U.S.C.section 360bbb-3(b)(1), unless the authorization is terminated  or revoked sooner.       Influenza A by PCR NEGATIVE NEGATIVE Final   Influenza B by PCR NEGATIVE NEGATIVE Final    Comment: (NOTE) The  Xpert Xpress SARS-CoV-2/FLU/RSV plus assay is intended as an aid in the diagnosis of influenza from Nasopharyngeal swab specimens and should not be used as a sole basis for treatment. Nasal washings and aspirates are unacceptable for Xpert Xpress SARS-CoV-2/FLU/RSV testing.  Fact Sheet for Patients: EntrepreneurPulse.com.au  Fact Sheet for Healthcare Providers: IncredibleEmployment.be  This test is not yet approved or cleared by the [place] FDA and has been authorized for detection and/or diagnosis of SARS-CoV-2 by FDA under an Emergency Use Authorization (EUA). This EUA will remain in effect (meaning this test can be used) for the duration of the COVID-19 declaration under Section 564(b)(1) of the Act, 21 U.S.C. section 360bbb-3(b)(1), unless the authorization is terminated or revoked.  Performed at [facility], [place] [address], [place], [place] [address]     Radiology Studies: CT HEAD WO CONTRAST (5MM)  Result Date: 03/06/2022 CLINICAL DATA:  Head trauma, moderate-severe. Fall. History of dementia. EXAM: CT HEAD WITHOUT CONTRAST TECHNIQUE: Contiguous axial images were obtained from the base of the skull through the vertex without intravenous contrast. RADIATION DOSE REDUCTION: This exam was performed according to the departmental dose-optimization program which includes automated exposure control, adjustment of the mA and/or kV according to patient size and/or use of iterative reconstruction technique. COMPARISON:  Head CT 02/05/2022 and MRI 01/01/2022 FINDINGS: Brain: There is no evidence of an acute infarct, intracranial hemorrhage, mass, midline shift, or extra-axial fluid collection. Patchy hypodensities in the cerebral white matter bilaterally are unchanged and nonspecific but compatible with mild chronic small vessel ischemic disease, and small chronic right cerebellar infarcts are again seen. There is moderate cerebral atrophy.  Heterogeneous hypoattenuation of the basal ganglia corresponds to dilated perivascular spaces on MRI. Scattered chronic sulcal calcifications are again noted. Vascular: Calcified atherosclerosis at the skull base. Skull: No fracture or suspicious osseous lesion. Sinuses/Orbits: Visualized paranasal sinuses and mastoid air cells are clear. Bilateral cataract extraction. Other: None. IMPRESSION: 1. No evidence of acute intracranial abnormality. 2. Chronic small vessel ischemic disease and cerebral atrophy. Electronically Signed   By: Logan Bores M.D.   On: 03/06/2022 20:04   DG Knee Complete 4 Views Right  Result Date: 03/06/2022 CLINICAL DATA:  Right knee pain after fall. EXAM: RIGHT KNEE - COMPLETE 4+ VIEW COMPARISON:  None Available. FINDINGS: No evidence of fracture, dislocation, or joint effusion. Severe narrowing of medial joint space is noted. Chondrocalcinosis is noted laterally. Soft tissues are unremarkable.  IMPRESSION: Severe degenerative joint disease.  No acute abnormality seen. Electronically Signed   By: Marijo Conception M.D.   On: 03/06/2022 13:36   DG Chest Port 1 View  Result Date: 03/06/2022 CLINICAL DATA:  Fall. EXAM: PORTABLE CHEST 1 VIEW COMPARISON:  March 04, 2022. FINDINGS: Stable cardiomegaly. Mild bibasilar subsegmental atelectasis or scarring is noted. Old right clavicular fracture. IMPRESSION: Mild bibasilar subsegmental atelectasis or scarring. Aortic Atherosclerosis (ICD10-I70.0). Electronically Signed   By: Marijo Conception M.D.   On: 03/06/2022 13:34   DG Pelvis 1-2 Views  Result Date: 03/06/2022 CLINICAL DATA:  Fall. EXAM: PELVIS - 1-2 VIEW COMPARISON:  February 22, 2014. FINDINGS: There is no evidence of pelvic fracture or diastasis. Status post left hip arthroplasty. No pelvic bone lesions are seen. IMPRESSION: No acute abnormality is noted. Electronically Signed   By: Marijo Conception M.D.   On: 03/06/2022 13:33     Scheduled Meds:  amiodarone  200 mg Oral Q1200    amLODipine  5 mg Oral Daily   atorvastatin  40 mg Oral QPM   clopidogrel  75 mg Oral Q1200   isosorbide mononitrate  30 mg Oral q morning   levothyroxine  75 mcg Oral Q0600   metoprolol tartrate  25 mg Oral BID   pantoprazole  80 mg Oral Q1200   potassium chloride  40 mEq Oral BID   QUEtiapine  25 mg Oral QHS   Continuous Infusions:  cefTRIAXone (ROCEPHIN)  IV Stopped (03/06/22 1937)    LOS: 0 days   Raiford Noble, DO [facility] Available via Epic secure chat 7am-7pm After these hours, please refer to coverage provider listed on [email or web address] 03/07/2022, 2:38 PM
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
## 2022-03-07 NOTE — Social Work (Addendum)
CSW met with [name] at [facility] about pt care after DC. Andee Poles has been working with the pt's family and needs pt's clinicals.  CSW spoke with pt's daughter and POA Ivin Booty to get permission to share pt's information. Ivin Booty would like to work with Andee Poles on a DC plan to get her mother back to [name] if possible although PT's recommending SNF. Pt and family has a meeting with palliative today at 2:30pm. Ivin Booty and Andee Poles will follow up with CSW tomorrow to provide update on DC plan.

## 2022-03-07 NOTE — Progress Notes (Addendum)
     Referral received for [name] :goals of care discussion. Chart reviewed and updates received from RN. Patient assessed and is unable to engage appropriately in discussions. Attempted to contact patient's daughter Ivin Booty. Unable to reach. Voicemail left with contact information given.    PMT will re-attempt to contact family at a later time/date. Detailed note and recommendations to follow once GOC has been completed.   Thank you for your referral and allowing PMT to assist in [name]'s care.   [facility] Palliative Medicine Team  Team Phone # [phone]   NO CHARGE   Addendum: Daughter returned my call, meeting scheduled for today at 2:30pm.

## 2022-03-07 NOTE — Progress Notes (Signed)
Patient unable to tolerate potassium IV. Md made aware.

## 2022-03-07 NOTE — Evaluation (Signed)
Physical Therapy Evaluation Patient Details Name: [name] MRN: [id number] DOB: May 30, 1925 Today's Date: 03/07/2022  History of Present Illness  [name] is a 88 y.o. female with medical history significant of  colon cancer, right breast cancer s/p mastectomy, recently found left breast mass concerning for cancer but not pursuing treatment, CHF, GERD, HTN, hypothyroidism, hx of CVA, PAF on no anticoagulation, Ckd stage 3 who has had recurrent falls. She fell today and EMS was called. She has had increasing weakness since her admit in 02/04/22. She was admitted for acute on chronic CHF and HTN emergency. Staff at her IL found her on the ground. Daughter does not think she was on the ground for long. Patient can give me no history and doesn't know whey she is here. She also has had poor PO intake.  Clinical Impression   Pt admitted with above diagnosis. Lives at [facility] independent living apartment, 3rd floor with elevator access;  At her baseline (before recent hospital admission in early August), pt was able to manage independently in her apartment, walking with rollator RW per chart review; Since recent admission, she has had a functional decline, needing more assist and with falls; Presents to PT with generalized weakness, incr fall risk, and decr activity tolerance;  At her current functional level, SNF level of care is appropriate --  however noted with OBS status, SNF may not be covered; would recommend aranging for at or near 24 hour supervision/assist at her apartment if SNF isn't an option; Pt currently with functional limitations due to the deficits listed below (see PT Problem List). Pt will benefit from skilled PT to increase their independence and safety with mobility to allow discharge to the venue listed below.          Recommendations for follow up therapy are one component of a multi-disciplinary discharge planning process, led by the attending physician.   Recommendations may be updated based on patient status, additional functional criteria and insurance authorization.  Follow Up Recommendations Skilled nursing-short term rehab (<3 hours/day) Can patient physically be transported by private vehicle: Yes    Assistance Recommended at Discharge Frequent or constant Supervision/Assistance  Patient can return home with the following  A lot of help with walking and/or transfers;A lot of help with bathing/dressing/bathroom;Assistance with cooking/housework;Assist for transportation;Help with stairs or ramp for entrance    Equipment Recommendations Hospital bed;Wheelchair (measurements PT);Wheelchair cushion (measurements PT) (I believe she has a rollator RW -- this will need to be confirmed; a hospital bed and wheelchair may be helpful as well)  Recommendations for Other Services  OT consult (as ordered; also noted Palliative Medicine Consult)    Functional Status Assessment Patient has had a recent decline in their functional status and demonstrates the ability to make significant improvements in function in a reasonable and predictable amount of time.     Precautions / Restrictions Precautions Precautions: Fall      Mobility  Bed Mobility Overal bed mobility: Needs Assistance Bed Mobility: Supine to Sit     Supine to sit: Mod assist     General bed mobility comments: Cues to initiate moving LEs to EOB; slow moving; Mod assist to help trunk up to fully sitting; cues to scoot to EOB and get feet to the floor    Transfers Overall transfer level: Needs assistance Equipment used: Rolling walker (2 wheels) Transfers: Sit to/from Stand Sit to Stand: Min assist           General transfer  comment: Min assist to rise and steady    Ambulation/Gait Ambulation/Gait assistance: Min assist, +2 safety/equipment (chair at the ready) Gait Distance (Feet): 12 Feet Assistive device: Rolling walker (2 wheels) Gait Pattern/deviations:  Decreased step length - right, Decreased step length - left, Trunk flexed Gait velocity: quite slow, indicative of incr fall risk     General Gait Details: Short steps; heavy dependence on RW for stability; Required cues/reminders of the plan to walk to the sink and take care of her dentures to get ready for breakfast  Stairs            Wheelchair Mobility    Modified Rankin (Stroke Patients Only)       Balance Overall balance assessment: Needs assistance   Sitting balance-Leahy Scale: Fair       Standing balance-Leahy Scale: Poor Standing balance comment: Unable to let go of the counter while standing at the sink for hand washing; Stood for approx 5 mintues, then onset of shakiness, and needed to sit                             Pertinent Vitals/Pain Pain Assessment Pain Assessment: Faces Faces Pain Scale: Hurts even more Pain Location: LLE with initail movement in the bed Pain Descriptors / Indicators: Grimacing Pain Intervention(s): Monitored during session    Home Living Family/patient expects to be discharged to:: Assisted living (Independent living apt)                 Home Equipment: Rollator (4 wheels);Wheelchair - Sport and exercise psychologist Comments: Lives at [facility] on third floor with elevator; enjoys reading; likes mysteries    Prior Function Prior Level of Function : Independent/Modified Independent (leading up to last admission 8/4; per H&P, has been declining since that admission in August, and family is looking into more caregiver support and hospice care)             Mobility Comments: Uses rollator without physical assistance. ADLs Comments: Pt independent with bathing, dressing, feeding, and managing medications. Does not cook or drive.     Hand Dominance   Dominant Hand: Right    Extremity/Trunk Assessment   Upper Extremity Assessment Upper Extremity Assessment: Defer to OT evaluation    Lower Extremity  Assessment Lower Extremity Assessment: Generalized weakness (and donted incr shakiness with more time (approx 4-5 minutes) in static standing)       Communication   Communication: HOH  Cognition Arousal/Alertness: Awake/alert Behavior During Therapy: WFL for tasks assessed/performed Overall Cognitive Status: No family/caregiver present to determine baseline cognitive functioning Area of Impairment: Orientation, Memory, Awareness, Problem solving                 Orientation Level: Time, Situation   Memory: Decreased short-term memory       Problem Solving: Slow processing General Comments: Required cues/reminders of the plan to walk to the sink and take care of her dentures to get ready for breakfast        General Comments      Exercises     Assessment/Plan    PT Assessment Patient needs continued PT services  PT Problem List Decreased strength;Decreased activity tolerance;Decreased balance;Decreased mobility;Decreased coordination;Decreased cognition;Decreased knowledge of use of DME;Decreased safety awareness;Decreased knowledge of precautions       PT Treatment Interventions DME instruction;Gait training;Stair training;Functional mobility training;Therapeutic activities;Therapeutic exercise;Balance training;Neuromuscular re-education;Cognitive remediation;Patient/family education    PT Goals (Current goals can be found in  the Care Plan section)  Acute Rehab PT Goals Patient Stated Goal: Agrees to getting OOB PT Goal Formulation: Patient unable to participate in goal setting Time For Goal Achievement: 03/21/22 Potential to Achieve Goals: Good    Frequency Min 3X/week     Co-evaluation PT/OT/SLP Co-Evaluation/Treatment: Yes Reason for Co-Treatment: To address functional/ADL transfers           AM-PAC PT "6 Clicks" Mobility  Outcome Measure Help needed turning from your back to your side while in a flat bed without using bedrails?: A Little Help  needed moving from lying on your back to sitting on the side of a flat bed without using bedrails?: A Lot Help needed moving to and from a bed to a chair (including a wheelchair)?: A Little Help needed standing up from a chair using your arms (e.g., wheelchair or bedside chair)?: A Lot Help needed to walk in hospital room?: A Little Help needed climbing 3-5 steps with a railing? : A Lot 6 Click Score: 15    End of Session Equipment Utilized During Treatment: Gait belt Activity Tolerance: Patient tolerated treatment well (though limited to short, in-room distances) Patient left: in chair;with call bell/phone within reach;with chair alarm set;with nursing/sitter in room Nurse Communication: Mobility status PT Visit Diagnosis: Unsteadiness on feet (R26.81);Other abnormalities of gait and mobility (R26.89);History of falling (Z91.81)    Time: 2585-2778 PT Time Calculation (min) (ACUTE ONLY): 25 min   Charges:   PT Evaluation $PT Eval Moderate Complexity: [place], [place] Office [phone]   Colletta [place] 03/07/2022, 11:02 AM

## 2022-03-08 DIAGNOSIS — I161 Hypertensive emergency: Secondary | ICD-10-CM | POA: Diagnosis present

## 2022-03-08 DIAGNOSIS — I480 Paroxysmal atrial fibrillation: Secondary | ICD-10-CM | POA: Diagnosis present

## 2022-03-08 DIAGNOSIS — B965 Pseudomonas (aeruginosa) (mallei) (pseudomallei) as the cause of diseases classified elsewhere: Secondary | ICD-10-CM | POA: Diagnosis present

## 2022-03-08 DIAGNOSIS — I130 Hypertensive heart and chronic kidney disease with heart failure and stage 1 through stage 4 chronic kidney disease, or unspecified chronic kidney disease: Secondary | ICD-10-CM | POA: Diagnosis present

## 2022-03-08 DIAGNOSIS — I5032 Chronic diastolic (congestive) heart failure: Secondary | ICD-10-CM | POA: Diagnosis present

## 2022-03-08 DIAGNOSIS — Z66 Do not resuscitate: Secondary | ICD-10-CM | POA: Diagnosis present

## 2022-03-08 DIAGNOSIS — D630 Anemia in neoplastic disease: Secondary | ICD-10-CM | POA: Diagnosis present

## 2022-03-08 DIAGNOSIS — E876 Hypokalemia: Secondary | ICD-10-CM | POA: Diagnosis present

## 2022-03-08 DIAGNOSIS — I739 Peripheral vascular disease, unspecified: Secondary | ICD-10-CM | POA: Diagnosis not present

## 2022-03-08 DIAGNOSIS — F0394 Unspecified dementia, unspecified severity, with anxiety: Secondary | ICD-10-CM | POA: Diagnosis present

## 2022-03-08 DIAGNOSIS — E860 Dehydration: Secondary | ICD-10-CM | POA: Diagnosis present

## 2022-03-08 DIAGNOSIS — Z8673 Personal history of transient ischemic attack (TIA), and cerebral infarction without residual deficits: Secondary | ICD-10-CM | POA: Diagnosis not present

## 2022-03-08 DIAGNOSIS — R296 Repeated falls: Secondary | ICD-10-CM | POA: Diagnosis present

## 2022-03-08 DIAGNOSIS — N390 Urinary tract infection, site not specified: Secondary | ICD-10-CM | POA: Diagnosis present

## 2022-03-08 DIAGNOSIS — R2689 Other abnormalities of gait and mobility: Secondary | ICD-10-CM | POA: Diagnosis not present

## 2022-03-08 DIAGNOSIS — E039 Hypothyroidism, unspecified: Secondary | ICD-10-CM | POA: Diagnosis present

## 2022-03-08 DIAGNOSIS — E8809 Other disorders of plasma-protein metabolism, not elsewhere classified: Secondary | ICD-10-CM | POA: Diagnosis present

## 2022-03-08 DIAGNOSIS — N3000 Acute cystitis without hematuria: Secondary | ICD-10-CM | POA: Diagnosis not present

## 2022-03-08 DIAGNOSIS — K219 Gastro-esophageal reflux disease without esophagitis: Secondary | ICD-10-CM | POA: Diagnosis present

## 2022-03-08 DIAGNOSIS — F32A Depression, unspecified: Secondary | ICD-10-CM | POA: Diagnosis not present

## 2022-03-08 DIAGNOSIS — C50911 Malignant neoplasm of unspecified site of right female breast: Secondary | ICD-10-CM | POA: Diagnosis present

## 2022-03-08 DIAGNOSIS — Z85038 Personal history of other malignant neoplasm of large intestine: Secondary | ICD-10-CM | POA: Diagnosis not present

## 2022-03-08 DIAGNOSIS — I639 Cerebral infarction, unspecified: Secondary | ICD-10-CM | POA: Diagnosis not present

## 2022-03-08 DIAGNOSIS — Z853 Personal history of malignant neoplasm of breast: Secondary | ICD-10-CM | POA: Diagnosis not present

## 2022-03-08 DIAGNOSIS — R531 Weakness: Secondary | ICD-10-CM | POA: Diagnosis not present

## 2022-03-08 DIAGNOSIS — K5900 Constipation, unspecified: Secondary | ICD-10-CM | POA: Diagnosis not present

## 2022-03-08 DIAGNOSIS — N6320 Unspecified lump in the left breast, unspecified quadrant: Secondary | ICD-10-CM | POA: Diagnosis not present

## 2022-03-08 DIAGNOSIS — Z79899 Other long term (current) drug therapy: Secondary | ICD-10-CM | POA: Diagnosis not present

## 2022-03-08 DIAGNOSIS — Z9011 Acquired absence of right breast and nipple: Secondary | ICD-10-CM | POA: Diagnosis not present

## 2022-03-08 DIAGNOSIS — N179 Acute kidney failure, unspecified: Secondary | ICD-10-CM | POA: Diagnosis present

## 2022-03-08 DIAGNOSIS — I10 Essential (primary) hypertension: Secondary | ICD-10-CM | POA: Diagnosis not present

## 2022-03-08 DIAGNOSIS — R627 Adult failure to thrive: Secondary | ICD-10-CM | POA: Diagnosis present

## 2022-03-08 DIAGNOSIS — W19XXXA Unspecified fall, initial encounter: Secondary | ICD-10-CM | POA: Diagnosis present

## 2022-03-08 DIAGNOSIS — D509 Iron deficiency anemia, unspecified: Secondary | ICD-10-CM | POA: Diagnosis not present

## 2022-03-08 DIAGNOSIS — N1832 Chronic kidney disease, stage 3b: Secondary | ICD-10-CM | POA: Diagnosis present

## 2022-03-08 DIAGNOSIS — R6251 Failure to thrive (child): Secondary | ICD-10-CM | POA: Diagnosis not present

## 2022-03-08 DIAGNOSIS — F411 Generalized anxiety disorder: Secondary | ICD-10-CM | POA: Diagnosis not present

## 2022-03-08 DIAGNOSIS — R413 Other amnesia: Secondary | ICD-10-CM | POA: Diagnosis not present

## 2022-03-08 DIAGNOSIS — Z515 Encounter for palliative care: Secondary | ICD-10-CM | POA: Diagnosis not present

## 2022-03-08 DIAGNOSIS — M6281 Muscle weakness (generalized): Secondary | ICD-10-CM | POA: Diagnosis not present

## 2022-03-08 DIAGNOSIS — R41841 Cognitive communication deficit: Secondary | ICD-10-CM | POA: Diagnosis not present

## 2022-03-08 LAB — CBC WITH DIFFERENTIAL/PLATELET
Abs Immature Granulocytes: 0.09 10*3/uL — ABNORMAL HIGH (ref 0.00–0.07)
Basophils Absolute: 0.1 10*3/uL (ref 0.0–0.1)
Basophils Relative: 1 %
Eosinophils Absolute: 0.1 10*3/uL (ref 0.0–0.5)
Eosinophils Relative: 1 %
HCT: 38.1 % (ref 36.0–46.0)
Hemoglobin: 11.7 g/dL — ABNORMAL LOW (ref 12.0–15.0)
Immature Granulocytes: 1 %
Lymphocytes Relative: 27 %
Lymphs Abs: 2.1 10*3/uL (ref 0.7–4.0)
MCH: 26.5 pg (ref 26.0–34.0)
MCHC: 30.7 g/dL (ref 30.0–36.0)
MCV: 86.2 fL (ref 80.0–100.0)
Monocytes Absolute: 1.1 10*3/uL — ABNORMAL HIGH (ref 0.1–1.0)
Monocytes Relative: 14 %
Neutro Abs: 4.4 10*3/uL (ref 1.7–7.7)
Neutrophils Relative %: 56 %
Platelets: 200 10*3/uL (ref 150–400)
RBC: 4.42 MIL/uL (ref 3.87–5.11)
RDW: 17.5 % — ABNORMAL HIGH (ref 11.5–15.5)
WBC: 7.8 10*3/uL (ref 4.0–10.5)
nRBC: 0 % (ref 0.0–0.2)

## 2022-03-08 LAB — COMPREHENSIVE METABOLIC PANEL
ALT: 14 U/L (ref 0–44)
AST: 18 U/L (ref 15–41)
Albumin: 3.4 g/dL — ABNORMAL LOW (ref 3.5–5.0)
Alkaline Phosphatase: 50 U/L (ref 38–126)
Anion gap: 7 (ref 5–15)
BUN: 18 mg/dL (ref 8–23)
CO2: 24 mmol/L (ref 22–32)
Calcium: 9.7 mg/dL (ref 8.9–10.3)
Chloride: 105 mmol/L (ref 98–111)
Creatinine, Ser: 1.04 mg/dL — ABNORMAL HIGH (ref 0.44–1.00)
GFR, Estimated: 50 mL/min — ABNORMAL LOW (ref >60)
Glucose, Bld: 119 mg/dL — ABNORMAL HIGH (ref 70–99)
Potassium: 4.4 mmol/L (ref 3.5–5.1)
Sodium: 136 mmol/L (ref 135–145)
Total Bilirubin: 0.5 mg/dL (ref 0.3–1.2)
Total Protein: 6.4 g/dL — ABNORMAL LOW (ref 6.5–8.1)

## 2022-03-08 LAB — MAGNESIUM: Magnesium: 2.2 mg/dL (ref 1.7–2.4)

## 2022-03-08 LAB — PHOSPHORUS: Phosphorus: 3.4 mg/dL (ref 2.5–4.6)

## 2022-03-08 MED ORDER — HYDRALAZINE HCL 20 MG/ML IJ SOLN
10.0000 mg | Freq: Four times a day (QID) | INTRAMUSCULAR | Status: DC | PRN
  Administered 2022-03-08: 10 mg via INTRAVENOUS
  Filled 2022-03-08: qty 1

## 2022-03-08 MED ORDER — KETOROLAC TROMETHAMINE 15 MG/ML IJ SOLN
7.5000 mg | INTRAMUSCULAR | Status: AC | PRN
  Administered 2022-03-08: 7.5 mg via INTRAVENOUS
  Filled 2022-03-08 (×2): qty 1

## 2022-03-08 NOTE — Progress Notes (Signed)
PROGRESS NOTE    [name]  FKC:[id number] DOB: [birth year] DOA: 03/06/2022 PCP: [name], MD   Brief Narrative:  HPI per Dr. Orma Flaming on 03/06/22 [name] is a 88 y.o. female with medical history significant of  colon cancer, right breast cancer s/p mastectomy, recently found left breast mass concerning for cancer but not pursuing treatment, CHF, GERD, HTN, hypothyroidism, hx of CVA, PAF on no anticoagulation, Ckd stage 3 who has had recurrent falls. She fell today and EMS was called. She has had increasing weakness since her admit in 02/04/22. She was admitted for acute on chronic CHF and HTN emergency. Staff at her [facility] found her on the ground. Daughter does not think she was on the ground for long. Patient can give me no history and doesn't know whey she is here. She also has had poor PO intake.      He has been feeling good. Denies any fever/chills, vision changes/headaches, chest pain or palpitations, + shortness of breath, but no cough, abdominal pain, N/V/D, dysuria or leg swelling.    **Interim History  Palliative meeting with the patient's daughter and the patient today.  Unfortunately the urine culture still has not been collected and she has been on antibiotics making the urine culture essentially useless now.  We will continue empiric treatment of her suspected UTI and continue for at least 5 days and likely switch to p.o. soon.  PT recommending SNF and prior to discussed with the patient daughter again and will continue her DNR status during continue current care and continue efforts at SNF placement for palliative care to follow-up and then transition to hospice eventually.  Assessment and Plan: * Frequent falls with increased weakness -The patient is a 88 year old with history of worsening memory, FTT, and increased falls and weakness found on the ground after a fall at her independent living -Admitted to Obs Telemetry  -Stat head CT for unwitnessed fall done and  showed "No evidence of acute intracranial abnormality. 2. Chronic small vessel ischemic disease and cerebral atrophy" -Urine suspicious for UTI with hx of UTI and not taking medication.  Urinalysis showed a cloudy appearance with small hemoglobin, large leukocytes, positive nitrites, rare bacteria, 6-10 non-squamous epithelial cells, 11-20 RBCs per high-power field, 11-20 squamous epithelial cells and greater than 50 WBCs with unfortunately no urine culture obtained -She was initiated on IV Ceftriaxone, f/u on culture -Has stopped taking medication and TSH very abnormal in July. Repeat and was elevated again at 36.432. Re start home dose -Check B12 and was 103 and will need supplementation, CK was 226 -AKI on CKD-very gentle, time limited fluids; patient's BUN/creatinine went from 25/1.66 and is now 20/1.14 -Palliative care consulted awaiting her to appointment -PT/OT/SW for placement as does not appear safe to return to [place].  PT OT recommending SNF -Continue Fall precautions    UTI (urinary tract infection) -With frequent falls/worsening memory loss will treat for UTI -Had a urine infection and daughter states she doubt she even took these -See above and continue IV Abx -F/u on urine culture and still not collected essentially useless now given that it has not been collected and because she has been on antibiotics   Acute renal failure superimposed on stage 3b chronic kidney disease ([place]) -Baseline creatinine appears to be around 1.0-1.2 -UA suspicious for infection  -Culture not obtained currently -Strict I/O -Hold nephrotoxic medications, contrast dyes, hypotension and dehydration to ensure adequate renal perfusion and will need to renally dose  medications -Patient's BUN/Cr is improved and went from 25/1.66 -> 20/1.14 -> 18/1.04 -Was given IV fluids which are now stopped -Continue To monitor trend renal function carefully repeat CMP in a.m.   Paroxysmal atrial fibrillation (HCC) -Not on  AC -Continue Amiodarone and metoprolol -Continue monitor on telemetry   Hypothyroidism -TSH 2 months ago was >35 with no repeat -Could be contributing to fall -Repeat TSH free T4 and was 36.42 with a free T4 of 0.66 respectively -Adjust medication as needed (she has not been taking this)  -Continue Levothyroxine 75 mcg p.o. daily for now   Chronic diastolic CHF (congestive heart failure) ([place]) -Does not appear clinically overloaded and CXR with no acute edema or LE swelling -She has not been taking her medication  -Echo 5/23: EF of 60-65%. Grade 2 DD.  -Has AKI, hold lasix monitor closely  -Strict I/O and is +260 mL since admission  -Continue with Isosorbide Mononitrate 30 minutes p.o. daily as well as Metoprolol Tartrate 25 mg p.o. twice daily   Essential Hypertension -Elevated, but has not been taking her medication  -Start back amlodipine, imdur, lopressor -Not taking her hydralazine  -Continue to monitor blood pressures per protocol -Last blood pressure reading was 116/65   Hypokalemia -Patient's potassium was 3.3 and improved to 4.4 -Unable to tolerate IV potassium chloride -Check magnesium level in a.m. -Continue to monitor replete as necessary repeat CMP in a.m.  Normocytic Anemia -Patient's hemoglobin/hematocrit went from 12.8/41.8 -> 12.5/40.5 -> 11.7/38.1 -Check Anemia Panel in the AM -Continue to Monitor for S/Sx of Bleeding; No overt bleeding noted -Repeat CBC in the AM   Hypoalbuminemia -Patient's albumin level went from 3.9 is now 3.4 -Continue monitor and trend and repeat CMP   GERD/GI prophylaxis -Continue Nexium substitution with pantoprazole 80 mg p.o. daily   History of CVA (Cerebrovascular Accident) -Continue plavix/statin  -Head CT was done and resuming clopidogrel 75 mg p.o. daily as well as atorvastatin 40 g p.o. every evening   Memory Loss -No diagnosis of dementia, but daughter states her doctor states she has dementia -Daughter states she  has had worsening memory loss since may and personality -She has stopped taking medication and has poor PO intake -Daughter interested in hospice, palliative care consult -Delirium precautions -[name] daughter met with palliative care and she is completely on board with hospice philosophy, however she can't pay for 24/7 caregivers or LTC/ALF and can't bring her home from ILF as she has no help.  -She is agreeable to SNF for rehab with palliative; Per discussions would then transition to hospice rather than return to the hospital if (when) she declines from there.    Left breast mass -Concern for malignancy, have opted to not treat -Palliative care consult, interested in hospice   DVT prophylaxis: Place and maintain sequential compression device Start: 03/06/22 1813    Code Status: DNR Family Communication: No family currently at bedside  Disposition Plan:  Level of care: Telemetry Medical Status is: Inpatient Remains inpatient appropriate because: Needs SNF placement and   Consultants:  Palliative care medicine  Procedures:  As above  Antimicrobials:  Anti-infectives (From admission, onward)    Start     Dose/Rate Route Frequency Ordered Stop   03/06/22 1600  cefTRIAXone (ROCEPHIN) 1 g in sodium chloride 0.9 % 100 mL IVPB        1 g 200 mL/hr over 30 Minutes Intravenous Every 24 hours 03/06/22 1552     03/06/22 0000  cefTRIAXone (ROCEPHIN) 1 g injection  1 g Intramuscular  Once 03/06/22 1535 03/06/22 2359        Subjective: Seen and examined at bedside and she is resting comfortably. She is complaining of some leg pain.  No nausea or vomiting.  Nursing states that she ate her breakfast and lunch fairly well.  No other concerns or complaints at this time.  Objective: Vitals:   03/08/22 0530 03/08/22 0834 03/08/22 1238 03/08/22 1605  BP: (!) 167/61 (!) 160/78 (!) 176/63 116/65  Pulse: (!) 50 (!) 49 (!) 55 62  Resp:  _0 Temp:    97.9 F (36.6 C)   TempSrc:    Oral  SpO2: 100% 100% 99% 98%  Weight:      Height:        Intake/Output Summary (Last 24 hours) at 03/08/2022 1702 Last data filed at 03/08/2022 1238 Gross per 24 hour  Intake 1220 ml  Output 1000 ml  Net 220 ml   Filed Weights   03/06/22 1201  Weight: 70 kg   Examination: Physical Exam:  Constitutional: Thin elderly Caucasian female currently no acute distress  Respiratory: Diminished to auscultation bilaterally with coarse breath sounds, no wheezing, rales, rhonchi or crackles. Normal respiratory effort and patient is not tachypenic. No accessory muscle use.  Unlabored breathing Cardiovascular: RRR, no murmurs / rubs / gallops. S1 and S2 auscultated. No extremity edema.  Abdomen: Soft, non-tender, non-distended. Bowel sounds positive.  GU: Deferred. Musculoskeletal: No clubbing / cyanosis of digits/nails. No joint deformity upper and lower extremities.  Skin: No rashes, lesions, ulcers on limited skin evaluation. No induration; Warm and dry.  Neurologic: CN 2-12 grossly intact with no focal deficits. Romberg sign and cerebellar reflexes not assessed.   Data Reviewed: I have personally reviewed following labs and imaging studies  CBC: Recent Labs  Lab 03/04/22 1200 03/06/22 1248 03/07/22 0019 03/08/22 0045  WBC 6.0 10.9* 8.1 7.8  NEUTROABS  --  7.5  --  4.4  HGB 13.0 12.8 12.5 11.7*  HCT 42.0 41.8 40.5 38.1  MCV 85.9 86.7 86.0 86.2  PLT 241 212 210 449   Basic Metabolic Panel: Recent Labs  Lab 03/04/22 1200 03/06/22 1248 03/07/22 0019 03/08/22 0045  NA 136 137 135 136  K 4.1 3.9 3.3* 4.4  CL 104 101 100 105  CO2 _1 GLUCOSE 108* 117* 95 119*  BUN 20 25* 20 18  CREATININE 1.09* 1.66* 1.14* 1.04*  CALCIUM 9.8 10.1 9.5 9.7  MG  --   --   --  2.2  PHOS  --   --   --  3.4   GFR: Estimated Creatinine Clearance: 32.2 mL/min (A) (by C-G formula based on SCr of 1.04 mg/dL (H)). Liver Function Tests: Recent Labs  Lab 03/06/22 1248  03/08/22 0045  AST 24 18  ALT 14 14  ALKPHOS 49 50  BILITOT 0.5 0.5  PROT 7.1 6.4*  ALBUMIN 3.9 3.4*   No results for input(s): "LIPASE", "AMYLASE" in the last 168 hours. No results for input(s): "AMMONIA" in the last 168 hours. Coagulation Profile: No results for input(s): "INR", "PROTIME" in the last 168 hours. Cardiac Enzymes: Recent Labs  Lab 03/06/22 2146  CKTOTAL 226   BNP (last 3 results) No results for input(s): "PROBNP" in the last 8760 hours. HbA1C: No results for input(s): "HGBA1C" in the last 72 hours. CBG: No results for input(s): "GLUCAP" in the last 168 hours. Lipid Profile: No results for input(s): "CHOL", "HDL", "LDLCALC", "TRIG", "  CHOLHDL", "LDLDIRECT" in the last 72 hours. Thyroid Function Tests: Recent Labs    03/06/22 2146  TSH 36.432*  FREET4 0.66   Anemia Panel: Recent Labs    03/06/22 2146  VITAMINB12 103*   Sepsis Labs: No results for input(s): "PROCALCITON", "LATICACIDVEN" in the last 168 hours.  Recent Results (from the past 240 hour(s))  Resp Panel by RT-PCR (Flu A&B, Covid) Anterior Nasal Swab     Status: None   Collection Time: 02/26/22  5:09 PM   Specimen: Anterior Nasal Swab  Result Value Ref Range Status   SARS Coronavirus 2 by RT PCR NEGATIVE NEGATIVE Final    Comment: (NOTE) SARS-CoV-2 target nucleic acids are NOT DETECTED.  The SARS-CoV-2 RNA is generally detectable in upper respiratory specimens during the acute phase of infection. The lowest concentration of SARS-CoV-2 viral copies this assay can detect is 138 copies/mL. A negative result does not preclude SARS-Cov-2 infection and should not be used as the sole basis for treatment or other patient management decisions. A negative result may occur with  improper specimen collection/handling, submission of specimen other than nasopharyngeal swab, presence of viral mutation(s) within the areas targeted by this assay, and inadequate number of viral copies(<138 copies/mL).  A negative result must be combined with clinical observations, patient history, and epidemiological information. The expected result is Negative.  Fact Sheet for Patients:  EntrepreneurPulse.com.au  Fact Sheet for Healthcare Providers:  IncredibleEmployment.be  This test is no t yet approved or cleared by the [place] FDA and  has been authorized for detection and/or diagnosis of SARS-CoV-2 by FDA under an Emergency Use Authorization (EUA). This EUA will remain  in effect (meaning this test can be used) for the duration of the COVID-19 declaration under Section 564(b)(1) of the Act, 21 U.S.C.section 360bbb-3(b)(1), unless the authorization is terminated  or revoked sooner.       Influenza A by PCR NEGATIVE NEGATIVE Final   Influenza B by PCR NEGATIVE NEGATIVE Final    Comment: (NOTE) The Xpert Xpress SARS-CoV-2/FLU/RSV plus assay is intended as an aid in the diagnosis of influenza from Nasopharyngeal swab specimens and should not be used as a sole basis for treatment. Nasal washings and aspirates are unacceptable for Xpert Xpress SARS-CoV-2/FLU/RSV testing.  Fact Sheet for Patients: EntrepreneurPulse.com.au  Fact Sheet for Healthcare Providers: IncredibleEmployment.be  This test is not yet approved or cleared by the [place] FDA and has been authorized for detection and/or diagnosis of SARS-CoV-2 by FDA under an Emergency Use Authorization (EUA). This EUA will remain in effect (meaning this test can be used) for the duration of the COVID-19 declaration under Section 564(b)(1) of the Act, 21 U.S.C. section 360bbb-3(b)(1), unless the authorization is terminated or revoked.  Performed at [facility], [place] [address] [place], [place] [address]     Radiology Studies: CT HEAD WO CONTRAST (5MM)  Result Date: 03/06/2022 CLINICAL DATA:  Head trauma, moderate-severe. Fall. History of  dementia. EXAM: CT HEAD WITHOUT CONTRAST TECHNIQUE: Contiguous axial images were obtained from the base of the skull through the vertex without intravenous contrast. RADIATION DOSE REDUCTION: This exam was performed according to the departmental dose-optimization program which includes automated exposure control, adjustment of the mA and/or kV according to patient size and/or use of iterative reconstruction technique. COMPARISON:  Head CT 02/05/2022 and MRI 01/01/2022 FINDINGS: Brain: There is no evidence of an acute infarct, intracranial hemorrhage, mass, midline shift, or extra-axial fluid collection. Patchy hypodensities in the cerebral white matter bilaterally are  unchanged and nonspecific but compatible with mild chronic small vessel ischemic disease, and small chronic right cerebellar infarcts are again seen. There is moderate cerebral atrophy. Heterogeneous hypoattenuation of the basal ganglia corresponds to dilated perivascular spaces on MRI. Scattered chronic sulcal calcifications are again noted. Vascular: Calcified atherosclerosis at the skull base. Skull: No fracture or suspicious osseous lesion. Sinuses/Orbits: Visualized paranasal sinuses and mastoid air cells are clear. Bilateral cataract extraction. Other: None. IMPRESSION: 1. No evidence of acute intracranial abnormality. 2. Chronic small vessel ischemic disease and cerebral atrophy. Electronically Signed   By: Logan Bores M.D.   On: 03/06/2022 20:04    Scheduled Meds:  amiodarone  200 mg Oral Q1200   amLODipine  5 mg Oral Daily   atorvastatin  40 mg Oral QPM   clopidogrel  75 mg Oral Q1200   vitamin B-12  1,000 mcg Oral Daily   isosorbide mononitrate  30 mg Oral q morning   levothyroxine  75 mcg Oral Q0600   metoprolol tartrate  25 mg Oral BID   pantoprazole  80 mg Oral Q1200   QUEtiapine  25 mg Oral QHS   Continuous Infusions:  cefTRIAXone (ROCEPHIN)  IV 1 g (03/08/22 1619)    LOS: 0 days   Raiford Noble, DO [facility] Available via Epic secure chat 7am-7pm After these hours, please refer to coverage provider listed on [email or web address] 03/08/2022, 5:02 PM

## 2022-03-08 NOTE — TOC Initial Note (Signed)
Transition of Care [facility]) - Initial/Assessment Note    Patient Details  Name: [name] MRN: [id number] Date of Birth: Dec 24, 1924  [facility] CM/SW Contact:    Tresa Endo Phone Number: 03/08/2022, 5:17 PM  Clinical Narrative:                 CSW has been working with Gwendolyn Grant with [facility] on pt DC plan. Pt daughter is assisting in making the decisions and would like for pt to go to SNF before going back to LTC at [facility]. CSW has faxed pt out. PT has recommended SN, pt is min guard and has been able to walk 6 ft with a RW.   CSW will continue to follow and contact [place] and family once pt has SNF offers.         Patient Goals and CMS Choice        Expected Discharge Plan and Services                                                Prior Living Arrangements/Services                       Activities of Daily Living      Permission Sought/Granted                  Emotional Assessment              Admission diagnosis:  UTI (urinary tract infection) [N39.0] Weakness [R53.1] Failure to thrive (child) [R62.51] Multiple falls [R29.6] Fall, initial encounter [W19.XXXA] Knee injury, right, initial encounter [S89.91XA] Urinary tract infection without hematuria, site unspecified [N39.0] Patient Active Problem List   Diagnosis Date Noted   Frequent falls with increased weakness 03/06/2022   Chronic diastolic CHF (congestive heart failure) ([place]) 03/06/2022   Memory loss 03/06/2022   Acute on chronic heart failure with preserved ejection fraction (HFpEF) ([place]) 02/04/2022   Hypertensive emergency 02/04/2022   Paroxysmal atrial fibrillation ([place]) 02/04/2022   History of CVA (cerebrovascular accident) 02/04/2022   Left breast mass 50/53/9767   Acute metabolic encephalopathy 34/19/3790   UTI (urinary tract infection) 01/01/2022   Altered mental status    Acute diastolic heart failure ([place])  11/29/2021   Acute coronary syndrome ([place]) 08/30/2019   TIA (transient ischemic attack)    CVA (cerebral vascular accident) ([place]) 05/18/2019   Acute renal failure superimposed on stage 3b chronic kidney disease ([place]) 05/18/2019   History of breast cancer 05/18/2019   History of colon cancer 05/18/2019   Hypochromic anemia 05/18/2019   Rectal cancer ([place]) 12/16/2015   Depression 03/07/2014   Hip fracture ([place]) 02/22/2014   Fall 02/21/2014   Fracture of femoral neck, left ([place]) 02/21/2014   Diarrhea 05/07/2012   Acute pulmonary embolism ([place]) 01/04/2012   Acute pericardial effusion 01/04/2012   Fracture of medial malleolus, left, closed, nondisplaced -12/19/2011 12/22/2011   Fracture of left distal radius - 12/19/2011 12/21/2011   Fracture of lateral malleolus of right ankle - 12/19/2011 12/21/2011   Foot fracture, left - 12/19/2011 12/21/2011   Closed fracture of distal clavicle 12/21/2011   PERIPHERAL VASCULAR DISEASE 03/17/2008   GASTRITIS 11/29/2007   ADENOCARCINOMA, BREAST 10/16/2007   VENOUS INSUFFICIENCY 10/16/2007   Hypothyroidism 04/23/2007   ANXIETY 04/23/2007   Essential hypertension 04/23/2007  Diastolic dysfunction, left ventricle as evidenced by moderate LVH on echocardiogram June 2013 04/23/2007   GERD 04/23/2007   PCP:  [name], MD Pharmacy:   [facility], [place] [address] Phone: [phone] Fax: [phone]     Social Determinants of Health (SDOH) Interventions    Readmission Risk Interventions     No data to display

## 2022-03-08 NOTE — Plan of Care (Signed)
  Problem: Education: Goal: Knowledge of General Education information will improve Description: Including pain rating scale, medication(s)/side effects and non-pharmacologic comfort measures 03/08/2022 1136 by Santa Lighter, RN Outcome: Progressing 03/08/2022 1135 by Santa Lighter, RN Outcome: Progressing   Problem: Health Behavior/Discharge Planning: Goal: Ability to manage health-related needs will improve 03/08/2022 1136 by Santa Lighter, RN Outcome: Progressing 03/08/2022 1135 by Santa Lighter, RN Outcome: Progressing

## 2022-03-08 NOTE — Progress Notes (Signed)
Physical Therapy Treatment Patient Details Name: [name] MRN: [id number] DOB: 1924-09-26 Today's Date: 03/08/2022   History of Present Illness AUSTIN PONGRATZ is a 88 y.o. female with medical history significant of  colon cancer, right breast cancer s/p mastectomy, recently found left breast mass concerning for cancer but not pursuing treatment, CHF, GERD, HTN, hypothyroidism, hx of CVA, PAF on no anticoagulation, Ckd stage 3 who has had recurrent falls. She fell today and EMS was called. She has had increasing weakness since her admit in 02/04/22. She was admitted for acute on chronic CHF and HTN emergency. Staff at her [facility] found her on the ground. Daughter does not think she was on the ground for long. Patient can give me no history and doesn't know whey she is here. She also has had poor PO intake.    PT Comments    Continuing work on functional mobility and activity tolerance;  session focused on functional transfers and gait; assisted pt OOB to recliner for lunch; Pt reports lower chest discomfort (epigastric), and jsut not feeling right; RN aware, VSS; continue to recommend SNF for dc  Recommendations for follow up therapy are one component of a multi-disciplinary discharge planning process, led by the attending physician.  Recommendations may be updated based on patient status, additional functional criteria and insurance authorization.  Follow Up Recommendations  Skilled nursing-short term rehab (<3 hours/day) Can patient physically be transported by private vehicle: Yes   Assistance Recommended at Discharge Frequent or constant Supervision/Assistance  Patient can return home with the following A lot of help with walking and/or transfers;A lot of help with bathing/dressing/bathroom;Assistance with cooking/housework;Assist for transportation;Help with stairs or ramp for entrance   Equipment Recommendations  Hospital bed;Wheelchair (measurements PT);Wheelchair cushion (measurements PT)     Recommendations for Other Services OT consult     Precautions / Restrictions Precautions Precautions: Fall Restrictions Weight Bearing Restrictions: No     Mobility  Bed Mobility Overal bed mobility: Needs Assistance Bed Mobility: Supine to Sit     Supine to sit: Min assist     General bed mobility comments: Min handheld assist t pull to sit    Transfers Overall transfer level: Needs assistance Equipment used: Rolling walker (2 wheels) Transfers: Sit to/from Stand Sit to Stand: Min assist           General transfer comment: Min assist to rise and steady    Ambulation/Gait Ambulation/Gait assistance: Min Web designer (Feet): 6 Feet Assistive device: Rolling walker (2 wheels) Gait Pattern/deviations: Decreased step length - right, Decreased step length - left, Trunk flexed Gait velocity: quite slow, indicative of incr fall risk     General Gait Details: Short steps; heavy dependence on RW for stability   Stairs             Wheelchair Mobility    Modified Rankin (Stroke Patients Only)       Balance     Sitting balance-Leahy Scale: Fair       Standing balance-Leahy Scale: Poor Standing balance comment: needing unilateral support at minimum in standing                            Cognition Arousal/Alertness: Awake/alert Behavior During Therapy: WFL for tasks assessed/performed Overall Cognitive Status: No family/caregiver present to determine baseline cognitive functioning  Exercises      General Comments General comments (skin integrity, edema, etc.): BP 114/49, HR 61, O2 sats 100% on room air      Pertinent Vitals/Pain Pain Assessment Pain Assessment: Faces Faces Pain Scale: Hurts even more Pain Location: L shoulder and LLE with initail movement in the bed Pain Descriptors / Indicators: Grimacing Pain Intervention(s): Monitored during session, Heat  applied (to shoulder)    Home Living                          Prior Function            PT Goals (current goals can now be found in the care plan section) Acute Rehab PT Goals Patient Stated Goal: Agrees to getting OOB PT Goal Formulation: Patient unable to participate in goal setting Time For Goal Achievement: 03/21/22 Potential to Achieve Goals: Good Progress towards PT goals: Progressing toward goals    Frequency    Min 3X/week      PT Plan Current plan remains appropriate    Co-evaluation              AM-PAC PT "6 Clicks" Mobility   Outcome Measure  Help needed turning from your back to your side while in a flat bed without using bedrails?: A Little Help needed moving from lying on your back to sitting on the side of a flat bed without using bedrails?: A Lot Help needed moving to and from a bed to a chair (including a wheelchair)?: A Little Help needed standing up from a chair using your arms (e.g., wheelchair or bedside chair)?: A Lot Help needed to walk in hospital room?: A Little Help needed climbing 3-5 steps with a railing? : A Lot 6 Click Score: 15    End of Session Equipment Utilized During Treatment: Gait belt Activity Tolerance: Patient tolerated treatment well Patient left: in chair;with call bell/phone within reach;with chair alarm set Nurse Communication: Mobility status (and chest discomfort) PT Visit Diagnosis: Unsteadiness on feet (R26.81);Other abnormalities of gait and mobility (R26.89);History of falling (Z91.81)     Time: 1321-1350 PT Time Calculation (min) (ACUTE ONLY): 29 min  Charges:  $Gait Training: 8-22 mins $Therapeutic Activity: 8-22 mins                     Roney Marion, PT  Acute Rehabilitation Services Office [phone]    Colletta [place] 03/08/2022, 3:04 PM

## 2022-03-08 NOTE — Plan of Care (Signed)
  Problem: Education: Goal: Knowledge of General Education information will improve Description Including pain rating scale, medication(s)/side effects and non-pharmacologic comfort measures Outcome: Progressing   Problem: Health Behavior/Discharge Planning: Goal: Ability to manage health-related needs will improve Outcome: Progressing   

## 2022-03-08 NOTE — Progress Notes (Signed)
Patient  was complaining of pain to left leg,patient was medicated for pain but still complaining,on -call informed and order given,monitoring in progress.

## 2022-03-08 NOTE — Progress Notes (Signed)
Daily Progress Note   Patient Name: [name]       Date: 03/08/2022 DOB: September 13, 1924  Age: 88 y.o. MRN#: [id number] Attending Physician: Kerney Elbe, DO Primary Care Physician: [name], MD Admit Date: 03/06/2022  Reason for Consultation/Follow-up: Establishing goals of care  Subjective: Medical records reviewed including progress notes, labs. Patient assessed at the bedside.  She is resting comfortably, did not attempt to arouse.  No family present during my visit.  Called patient's daughter Ivin Booty to follow-up and provide ongoing palliative support but was unable to reach today.  PMT contact information was left on voicemail and she was encouraged to call with further needs.  Questions and concerns addressed. PMT will continue to support holistically.   Length of Stay: 0   Physical Exam Vitals and nursing note reviewed.  Constitutional:      General: She is sleeping. She is not in acute distress. Cardiovascular:     Rate and Rhythm: Bradycardia present.  Pulmonary:     Effort: Pulmonary effort is normal. No respiratory distress.  Skin:    General: Skin is warm and dry.            Vital Signs: BP (!) 176/63 (BP Location: Right Arm)   Pulse (!) 55   Temp 98 F (36.7 C) (Oral)   Resp 18   Ht '5\' 7"'$  (1.702 m)   Wt 70 kg   SpO2 99%   BMI 24.17 kg/m  SpO2: SpO2: 99 % O2 Device: O2 Device: Room Air O2 Flow Rate:        Palliative Assessment/Data: 40%   Palliative Care Assessment & Plan   Patient Profile: 88 y.o. female  with past medical history of colon cancer, right breast cancer s/p mastectomy, recently found left breast mass concerning for cancer but not pursuing treatment, CHF, GERD, HTN, hypothyroidism, hx of CVA, PAF on no anticoagulation, Ckd  stage 3 admitted on 03/06/2022 with recurrent falls.    Patient has had 4 admissions in the past 6 months, 12 ED visits in the past 6 months. Daughter is interested in hospice and has been discussing with PCP.  PMT has been consulted to assist with goals of care conversation.  Assessment: Goals of care conversation History of colon cancer Right breast cancer status postmastectomy Recently found left breast mass  CHF History of CVA CKD 3 Recurrent falls Dementia  Recommendations/Plan: Continue DNR Continue current care Continue efforts at SNF placement for palliative care to follow-up and then transition to hospice PMT will continue to follow and support as needed   Prognosis:  Poor long-term prognosis with high risk to decline given recurrent falls, functional/cognitive/nutritional decline, advanced age, several chronic comorbidities  Discharge Planning: [facility] for rehab with Palliative care service follow-up    Total time: I spent 25 minutes in the care of the patient today in the above activities and documenting the encounter.   Dejuana Weist Johnnette Litter, PA-C  Palliative Medicine Team Team phone # [phone]  Thank you for allowing the Palliative Medicine Team to assist in the care of this patient. Please utilize secure chat with additional questions, if there is no response within 30 minutes please call the above phone number.  Palliative Medicine Team providers are available by phone from 7am to 7pm daily and can be reached through the team cell phone.  Should this patient require assistance outside of these hours, please call the patient's attending physician.

## 2022-03-09 ENCOUNTER — Telehealth: Payer: Self-pay

## 2022-03-09 DIAGNOSIS — N179 Acute kidney failure, unspecified: Secondary | ICD-10-CM | POA: Diagnosis not present

## 2022-03-09 DIAGNOSIS — K219 Gastro-esophageal reflux disease without esophagitis: Secondary | ICD-10-CM | POA: Diagnosis not present

## 2022-03-09 DIAGNOSIS — R296 Repeated falls: Secondary | ICD-10-CM | POA: Diagnosis not present

## 2022-03-09 DIAGNOSIS — B965 Pseudomonas (aeruginosa) (mallei) (pseudomallei) as the cause of diseases classified elsewhere: Secondary | ICD-10-CM

## 2022-03-09 DIAGNOSIS — N390 Urinary tract infection, site not specified: Principal | ICD-10-CM

## 2022-03-09 DIAGNOSIS — I5032 Chronic diastolic (congestive) heart failure: Secondary | ICD-10-CM | POA: Diagnosis not present

## 2022-03-09 LAB — CBC WITH DIFFERENTIAL/PLATELET
Abs Immature Granulocytes: 0.07 10*3/uL (ref 0.00–0.07)
Basophils Absolute: 0 10*3/uL (ref 0.0–0.1)
Basophils Relative: 0 %
Eosinophils Absolute: 0.1 10*3/uL (ref 0.0–0.5)
Eosinophils Relative: 2 %
HCT: 36.9 % (ref 36.0–46.0)
Hemoglobin: 11.7 g/dL — ABNORMAL LOW (ref 12.0–15.0)
Immature Granulocytes: 1 %
Lymphocytes Relative: 21 %
Lymphs Abs: 1.6 10*3/uL (ref 0.7–4.0)
MCH: 27 pg (ref 26.0–34.0)
MCHC: 31.7 g/dL (ref 30.0–36.0)
MCV: 85 fL (ref 80.0–100.0)
Monocytes Absolute: 1 10*3/uL (ref 0.1–1.0)
Monocytes Relative: 13 %
Neutro Abs: 4.7 10*3/uL (ref 1.7–7.7)
Neutrophils Relative %: 63 %
Platelets: 199 10*3/uL (ref 150–400)
RBC: 4.34 MIL/uL (ref 3.87–5.11)
RDW: 17.4 % — ABNORMAL HIGH (ref 11.5–15.5)
WBC: 7.4 10*3/uL (ref 4.0–10.5)
nRBC: 0 % (ref 0.0–0.2)

## 2022-03-09 LAB — COMPREHENSIVE METABOLIC PANEL
ALT: 12 U/L (ref 0–44)
AST: 18 U/L (ref 15–41)
Albumin: 3.2 g/dL — ABNORMAL LOW (ref 3.5–5.0)
Alkaline Phosphatase: 50 U/L (ref 38–126)
Anion gap: 8 (ref 5–15)
BUN: 26 mg/dL — ABNORMAL HIGH (ref 8–23)
CO2: 23 mmol/L (ref 22–32)
Calcium: 9.9 mg/dL (ref 8.9–10.3)
Chloride: 103 mmol/L (ref 98–111)
Creatinine, Ser: 1.18 mg/dL — ABNORMAL HIGH (ref 0.44–1.00)
GFR, Estimated: 43 mL/min — ABNORMAL LOW (ref >60)
Glucose, Bld: 114 mg/dL — ABNORMAL HIGH (ref 70–99)
Potassium: 4.6 mmol/L (ref 3.5–5.1)
Sodium: 134 mmol/L — ABNORMAL LOW (ref 135–145)
Total Bilirubin: 0.6 mg/dL (ref 0.3–1.2)
Total Protein: 6 g/dL — ABNORMAL LOW (ref 6.5–8.1)

## 2022-03-09 LAB — RETICULOCYTES
Immature Retic Fract: 15.4 % (ref 2.3–15.9)
RBC.: 4.35 MIL/uL (ref 3.87–5.11)
Retic Count, Absolute: 62.2 10*3/uL (ref 19.0–186.0)
Retic Ct Pct: 1.4 % (ref 0.4–3.1)

## 2022-03-09 LAB — IRON AND TIBC
Iron: 43 ug/dL (ref 28–170)
Saturation Ratios: 13 % (ref 10.4–31.8)
TIBC: 337 ug/dL (ref 250–450)
UIBC: 294 ug/dL

## 2022-03-09 LAB — PHOSPHORUS: Phosphorus: 4.3 mg/dL (ref 2.5–4.6)

## 2022-03-09 LAB — FOLATE: Folate: 5.3 ng/mL — ABNORMAL LOW (ref >5.9)

## 2022-03-09 LAB — VITAMIN B12: Vitamin B-12: 5372 pg/mL — ABNORMAL HIGH (ref 180–914)

## 2022-03-09 LAB — MAGNESIUM: Magnesium: 2.2 mg/dL (ref 1.7–2.4)

## 2022-03-09 LAB — FERRITIN: Ferritin: 13 ng/mL (ref 11–307)

## 2022-03-09 MED ORDER — ALUM & MAG HYDROXIDE-SIMETH 200-200-20 MG/5ML PO SUSP
30.0000 mL | ORAL | Status: DC | PRN
  Administered 2022-03-09: 30 mL via ORAL
  Filled 2022-03-09: qty 30

## 2022-03-09 MED ORDER — SODIUM CHLORIDE 0.9 % IV SOLN
1.0000 g | INTRAVENOUS | Status: DC
  Administered 2022-03-09: 1 g via INTRAVENOUS
  Filled 2022-03-09 (×2): qty 10

## 2022-03-09 NOTE — Telephone Encounter (Signed)
     Patient  visit on 9/1  at [facility] you been able to follow up with your primary care physician?NA  The patient was or was not able to obtain any needed medicine or equipment.NA  Are there diet recommendations that you are having difficulty following?NA  Patient expresses understanding of discharge instructions and education provided has no other needs at this time. NA     [place], [facility], Care Management  [phone] [address], [place], [place] [address] Phone: [phone] Email: [email or web address]

## 2022-03-09 NOTE — Progress Notes (Signed)
PROGRESS NOTE    [name]  WEX:[id number] DOB: 10/25/1924 DOA: 03/06/2022 PCP: [name], MD   Brief Narrative:  HPI per Dr. Orma Flaming on 03/06/22 [name] is a 88 y.o. female with medical history significant of  colon cancer, right breast cancer s/p mastectomy, recently found left breast mass concerning for cancer but not pursuing treatment, CHF, GERD, HTN, hypothyroidism, hx of CVA, PAF on no anticoagulation, Ckd stage 3 who has had recurrent falls. She fell today and EMS was called. She has had increasing weakness since her admit in 02/04/22. She was admitted for acute on chronic CHF and HTN emergency. Staff at her IL found her on the ground. Daughter does not think she was on the ground for long. Patient can give me no history and doesn't know whey she is here. She also has had poor PO intake.      He has been feeling good. Denies any fever/chills, vision changes/headaches, chest pain or palpitations, + shortness of breath, but no cough, abdominal pain, N/V/D, dysuria or leg swelling.    **Interim History  Palliative meeting with the patient's daughter and the patient today.  Unfortunately the urine culture still has not been collected and she has been on antibiotics making the urine culture essentially useless now.  We will continue empiric treatment of her suspected UTI and continue for at least 5 days and likely switch to p.o. soon.  PT recommending SNF and prior to discussed with the patient daughter again and will continue her DNR status during continue current care and continue efforts at SNF placement for palliative care to follow-up and then transition to hospice eventually.   Assessment and Plan: * Frequent falls with increased weakness -The patient is a 88 year old with history of worsening memory, FTT, and increased falls and weakness found on the ground after a fall at her independent living -Admitted to Obs Telemetry  -Stat head CT for unwitnessed fall done and  showed "No evidence of acute intracranial abnormality. 2. Chronic small vessel ischemic disease and cerebral atrophy" -Urine suspicious for UTI with hx of UTI and not taking medication.  Urinalysis showed a cloudy appearance with small hemoglobin, large leukocytes, positive nitrites, rare bacteria, 6-10 non-squamous epithelial cells, 11-20 RBCs per high-power field, 11-20 squamous epithelial cells and greater than 50 WBCs with unfortunately no urine culture obtained -She was initiated on IV Ceftriaxone but urine culture growing greater than 100,000 colony-forming units of Pseudomonas aeruginosa so we will change antibiotics -Has stopped taking medication and TSH very abnormal in July. Repeat and was elevated again at 36.432. Re start home dose -Check B12 and was 103 and will need supplementation, CK was 226 -AKI on CKD-very gentle, time limited fluids; patient's BUN/creatinine went from 25/1.66 and is now 20/1.14 -Palliative care consulted awaiting her to appointment -PT/OT/SW for placement as does not appear safe to return to [place].  PT OT recommending SNF and she is getting close to being discharged -Continue Fall precautions    Pseudomonas UTI (urinary tract infection) -With frequent falls/worsening memory loss will treat for UTI -Had a urine infection and daughter states she doubt she even took these -See above and continue IV Abx but will change given that she is growing Pseudomonas so I will change to IV cefepime instead of IV ceftriaxone -F/u on urine culture and final collected yesterday and growing out greater than 100,000 colony-forming units of Pseudomonas with sensitivities pending   Acute renal failure superimposed on stage 3b chronic  kidney disease ([place]) -Baseline creatinine appears to be around 1.0-1.2 -UA suspicious for infection  -Culture not obtained currently -Strict I/O -Hold nephrotoxic medications, contrast dyes, hypotension and dehydration to ensure adequate renal perfusion  and will need to renally dose medications -Patient's BUN/Cr is improved and went from 25/1.66 -> 20/1.14 -> 18/1.04 but is slowly gone up again and is now 26/1.18 -Was given IV fluids which are now stopped -Continue To monitor trend renal function carefully repeat CMP in a.m.   Paroxysmal atrial fibrillation (HCC) -Not on AC -Continue Amiodarone and metoprolol -Continue monitor on telemetry   Hypothyroidism -TSH 2 months ago was >35 with no repeat -Could be contributing to fall -Repeat TSH free T4 and was 36.42 with a free T4 of 0.66 respectively -Adjust medication as needed (she has not been taking this)  -Continue Levothyroxine 75 mcg p.o. daily for now and have her follow-up in outpatient setting   Chronic diastolic CHF (congestive heart failure) ([place]) -Does not appear clinically overloaded and CXR with no acute edema or LE swelling -She has not been taking her medication  -Echo 5/23: EF of 60-65%. Grade 2 DD.  -Has AKI, hold lasix monitor closely  -Strict I/O and is +50 mL since admission  -Continue with Isosorbide Mononitrate 30 minutes p.o. daily as well as Metoprolol Tartrate 25 mg p.o. twice daily   Essential Hypertension -Elevated, but has not been taking her medication  -Start back amlodipine, imdur, lopressor -Not taking her hydralazine  -Continue to monitor blood pressures per protocol -Last blood pressure reading was 140/65   Hypokalemia -Patient's potassium was 3.3 and improved to 4.4 yesterday and today was 4.6 -Magnesium level was 2.2 -Continue to monitor replete as necessary repeat CMP in a.m.   Normocytic Anemia -Patient's hemoglobin/hematocrit went from 12.8/41.8 -> 12.5/40.5 -> 11.7/38.1 -> 11.7/36.9 -Checked Anemia Panel and it showed an iron level of 43, U IBC of 294, TIBC of 337, saturation ratios of 13%, ferritin level 13, folate 5.3, vitamin B12 5372 -Continue to Monitor for S/Sx of Bleeding; No overt bleeding noted -Repeat CBC in the a.m.    Hypoalbuminemia -Patient's albumin level went from 3.9 -> 3.4 -> 3.2 -Continue monitor and trend and repeat CMP   GERD/GI prophylaxis -Continue Nexium substitution with pantoprazole 80 mg p.o. daily   History of CVA (Cerebrovascular Accident) -Continue plavix/statin  -Head CT was done and resuming clopidogrel 75 mg p.o. daily as well as atorvastatin 40 g p.o. every evening   Memory Loss -No diagnosis of dementia, but daughter states her doctor states she has dementia -Daughter states she has had worsening memory loss since may and personality -She has stopped taking medication and has poor PO intake -Daughter interested in hospice, palliative care consult -Delirium precautions -[name] daughter met with palliative care and she is completely on board with hospice philosophy, however she can't pay for 24/7 caregivers or LTC/ALF and can't bring her home from ILF as she has no help.  -She is agreeable to SNF for rehab with palliative; Per discussions would then transition to hospice rather than return to the hospital if (when) she declines from there.    Left breast mass -Concern for malignancy, have opted to not treat -Palliative care consult, interested in hospice   DVT prophylaxis: Place and maintain sequential compression device Start: 03/06/22 1813    Code Status: DNR Family Communication: No family currently at bedside  Disposition Plan:  Level of care: Telemetry Medical Status is: Inpatient Remains inpatient appropriate because: PT OT  recommending SNF and will discharge once final sensitivities for urinary tract infection and when she has a bed available and family makes a decision   Consultants:  Palliative Care Medicine  Procedures:  As above  Antimicrobials:  Anti-infectives (From admission, onward)    Start     Dose/Rate Route Frequency Ordered Stop   03/06/22 1600  cefTRIAXone (ROCEPHIN) 1 g in sodium chloride 0.9 % 100 mL IVPB        1 g 200 mL/hr over  30 Minutes Intravenous Every 24 hours 03/06/22 1552     03/06/22 0000  cefTRIAXone (ROCEPHIN) 1 g injection        1 g Intramuscular  Once 03/06/22 1535 03/06/22 2359       Subjective: Seen and examined at bedside and she is sitting in the chair and she is complaining about some leg pain and some arm pain.  States that she does not feel as well today.  Denies any chest pain or shortness of breath.  No other concerns or complaints at this time.  Objective: Vitals:   03/08/22 2014 03/09/22 0405 03/09/22 0744 03/09/22 1529  BP: 126/61 (!) 136/58 (!) 143/50 (!) 140/65  Pulse: 64 (!) 58 65 (!) 55  Resp:   16 16  Temp: 98.3 F (36.8 C) 97.6 F (36.4 C) 99.1 F (37.3 C) 98.7 F (37.1 C)  TempSrc: Oral Oral Oral Oral  SpO2: 100% 100% 98% 100%  Weight:      Height:        Intake/Output Summary (Last 24 hours) at 03/09/2022 1555 Last data filed at 03/09/2022 1000 Gross per 24 hour  Intake 540 ml  Output 750 ml  Net -210 ml   Filed Weights   03/06/22 1201  Weight: 70 kg   Examination: Physical Exam:  Constitutional: Thin elderly Caucasian female currently no acute distress sitting in chair at bedside appears slightly uncomfortable Respiratory: Diminished to auscultation bilaterally with coarse breath sounds, no wheezing, rales, rhonchi or crackles. Normal respiratory effort and patient is not tachypenic. No accessory muscle use.  Unlabored breathing Cardiovascular: RRR, no murmurs / rubs / gallops. S1 and S2 auscultated. No extremity edema.  Abdomen: Soft, non-tender, non-distended. Bowel sounds positive.  GU: Deferred. Musculoskeletal: No clubbing / cyanosis of digits/nails. No joint deformity upper and lower extremities.   Skin: No rashes, lesions, ulcers on limited skin evaluation. No induration; Warm and dry.  Neurologic: CN 2-12 grossly intact with no focal deficits.  Romberg sign and cerebellar reflexes not assessed.   Data Reviewed: I have personally reviewed following labs  and imaging studies  CBC: Recent Labs  Lab 03/04/22 1200 03/06/22 1248 03/07/22 0019 03/08/22 0045 03/09/22 0105  WBC 6.0 10.9* 8.1 7.8 7.4  NEUTROABS  --  7.5  --  4.4 4.7  HGB 13.0 12.8 12.5 11.7* 11.7*  HCT 42.0 41.8 40.5 38.1 36.9  MCV 85.9 86.7 86.0 86.2 85.0  PLT 241 212 210 200 710   Basic Metabolic Panel: Recent Labs  Lab 03/04/22 1200 03/06/22 1248 03/07/22 0019 03/08/22 0045 03/09/22 0105  NA 136 137 135 136 134*  K 4.1 3.9 3.3* 4.4 4.6  CL 104 101 100 105 103  CO2 _0 GLUCOSE 108* 117* 95 119* 114*  BUN 20 25* 20 18 26*  CREATININE 1.09* 1.66* 1.14* 1.04* 1.18*  CALCIUM 9.8 10.1 9.5 9.7 9.9  MG  --   --   --  2.2 2.2  PHOS  --   --   --  3.4 4.3   GFR: Estimated Creatinine Clearance: 28.3 mL/min (A) (by C-G formula based on SCr of 1.18 mg/dL (H)). Liver Function Tests: Recent Labs  Lab 03/06/22 1248 03/08/22 0045 03/09/22 0105  AST _0 ALT _1 ALKPHOS 49 50 50  BILITOT 0.5 0.5 0.6  PROT 7.1 6.4* 6.0*  ALBUMIN 3.9 3.4* 3.2*   No results for input(s): "LIPASE", "AMYLASE" in the last 168 hours. No results for input(s): "AMMONIA" in the last 168 hours. Coagulation Profile: No results for input(s): "INR", "PROTIME" in the last 168 hours. Cardiac Enzymes: Recent Labs  Lab 03/06/22 2146  CKTOTAL 226   BNP (last 3 results) No results for input(s): "PROBNP" in the last 8760 hours. HbA1C: No results for input(s): "HGBA1C" in the last 72 hours. CBG: No results for input(s): "GLUCAP" in the last 168 hours. Lipid Profile: No results for input(s): "CHOL", "HDL", "LDLCALC", "TRIG", "CHOLHDL", "LDLDIRECT" in the last 72 hours. Thyroid Function Tests: Recent Labs    03/06/22 2146  TSH 36.432*  FREET4 0.66   Anemia Panel: Recent Labs    03/06/22 2146 03/09/22 0105  VITAMINB12 103* 5,372*  FOLATE  --  5.3*  FERRITIN  --  13  TIBC  --  337  IRON  --  43  RETICCTPCT  --  1.4   Sepsis Labs: No results for input(s):  "PROCALCITON", "LATICACIDVEN" in the last 168 hours.  Recent Results (from the past 240 hour(s))  Urine Culture     Status: Abnormal (Preliminary result)   Collection Time: 03/06/22  4:20 PM   Specimen: Urine, Clean Catch  Result Value Ref Range Status   Specimen Description URINE, CLEAN CATCH  Final   Special Requests Normal  Final   Culture (A)  Final    >=100,000 COLONIES/mL PSEUDOMONAS AERUGINOSA SUSCEPTIBILITIES TO FOLLOW Performed at [facility], [address] [place], [place] [address]    Report Status PENDING  Incomplete    Radiology Studies: No results found.  Scheduled Meds:  amiodarone  200 mg Oral Q1200   amLODipine  5 mg Oral Daily   atorvastatin  40 mg Oral QPM   clopidogrel  75 mg Oral Q1200   vitamin B-12  1,000 mcg Oral Daily   isosorbide mononitrate  30 mg Oral q morning   levothyroxine  75 mcg Oral Q0600   metoprolol tartrate  25 mg Oral BID   pantoprazole  80 mg Oral Q1200   QUEtiapine  25 mg Oral QHS   Continuous Infusions:  cefTRIAXone (ROCEPHIN)  IV 1 g (03/08/22 1619)    LOS: 1 day   Raiford Noble, DO [facility] Available via Epic secure chat 7am-7pm After these hours, please refer to coverage provider listed on [email or web address] 03/09/2022, 3:55 PM

## 2022-03-09 NOTE — Progress Notes (Signed)
D/w Dr. Alfredia Ferguson, we will change ceftriaxone to cefepime x5 days for her pseudomonas UTI. Prob can go to oral fosfomycin x1 after about 2-3 days of cefepime.   Cefepime 1g IV q24  Onnie Boer, PharmD, [place], [place], CPP Infectious Disease Pharmacist 03/09/2022 4:12 PM

## 2022-03-10 DIAGNOSIS — K219 Gastro-esophageal reflux disease without esophagitis: Secondary | ICD-10-CM | POA: Diagnosis not present

## 2022-03-10 DIAGNOSIS — Z743 Need for continuous supervision: Secondary | ICD-10-CM | POA: Diagnosis not present

## 2022-03-10 DIAGNOSIS — I959 Hypotension, unspecified: Secondary | ICD-10-CM | POA: Diagnosis not present

## 2022-03-10 DIAGNOSIS — E860 Dehydration: Secondary | ICD-10-CM | POA: Diagnosis not present

## 2022-03-10 DIAGNOSIS — R296 Repeated falls: Secondary | ICD-10-CM | POA: Diagnosis not present

## 2022-03-10 DIAGNOSIS — E785 Hyperlipidemia, unspecified: Secondary | ICD-10-CM | POA: Diagnosis present

## 2022-03-10 DIAGNOSIS — F419 Anxiety disorder, unspecified: Secondary | ICD-10-CM | POA: Diagnosis not present

## 2022-03-10 DIAGNOSIS — R634 Abnormal weight loss: Secondary | ICD-10-CM | POA: Diagnosis not present

## 2022-03-10 DIAGNOSIS — K920 Hematemesis: Secondary | ICD-10-CM | POA: Diagnosis not present

## 2022-03-10 DIAGNOSIS — N1831 Chronic kidney disease, stage 3a: Secondary | ICD-10-CM | POA: Diagnosis not present

## 2022-03-10 DIAGNOSIS — K5909 Other constipation: Secondary | ICD-10-CM | POA: Diagnosis present

## 2022-03-10 DIAGNOSIS — M6281 Muscle weakness (generalized): Secondary | ICD-10-CM | POA: Diagnosis not present

## 2022-03-10 DIAGNOSIS — E538 Deficiency of other specified B group vitamins: Secondary | ICD-10-CM | POA: Diagnosis present

## 2022-03-10 DIAGNOSIS — J984 Other disorders of lung: Secondary | ICD-10-CM | POA: Diagnosis not present

## 2022-03-10 DIAGNOSIS — F32A Depression, unspecified: Secondary | ICD-10-CM | POA: Diagnosis not present

## 2022-03-10 DIAGNOSIS — R531 Weakness: Secondary | ICD-10-CM | POA: Diagnosis not present

## 2022-03-10 DIAGNOSIS — Z7689 Persons encountering health services in other specified circumstances: Secondary | ICD-10-CM | POA: Diagnosis not present

## 2022-03-10 DIAGNOSIS — K5900 Constipation, unspecified: Secondary | ICD-10-CM | POA: Diagnosis not present

## 2022-03-10 DIAGNOSIS — I5032 Chronic diastolic (congestive) heart failure: Secondary | ICD-10-CM | POA: Diagnosis not present

## 2022-03-10 DIAGNOSIS — N6320 Unspecified lump in the left breast, unspecified quadrant: Secondary | ICD-10-CM | POA: Diagnosis not present

## 2022-03-10 DIAGNOSIS — Z96652 Presence of left artificial knee joint: Secondary | ICD-10-CM | POA: Diagnosis present

## 2022-03-10 DIAGNOSIS — R109 Unspecified abdominal pain: Secondary | ICD-10-CM | POA: Diagnosis not present

## 2022-03-10 DIAGNOSIS — M8580 Other specified disorders of bone density and structure, unspecified site: Secondary | ICD-10-CM | POA: Diagnosis present

## 2022-03-10 DIAGNOSIS — Z66 Do not resuscitate: Secondary | ICD-10-CM | POA: Diagnosis present

## 2022-03-10 DIAGNOSIS — N390 Urinary tract infection, site not specified: Secondary | ICD-10-CM | POA: Diagnosis not present

## 2022-03-10 DIAGNOSIS — I517 Cardiomegaly: Secondary | ICD-10-CM | POA: Diagnosis not present

## 2022-03-10 DIAGNOSIS — N1832 Chronic kidney disease, stage 3b: Secondary | ICD-10-CM | POA: Diagnosis not present

## 2022-03-10 DIAGNOSIS — I480 Paroxysmal atrial fibrillation: Secondary | ICD-10-CM | POA: Diagnosis not present

## 2022-03-10 DIAGNOSIS — Z96642 Presence of left artificial hip joint: Secondary | ICD-10-CM | POA: Diagnosis present

## 2022-03-10 DIAGNOSIS — J449 Chronic obstructive pulmonary disease, unspecified: Secondary | ICD-10-CM | POA: Diagnosis present

## 2022-03-10 DIAGNOSIS — N281 Cyst of kidney, acquired: Secondary | ICD-10-CM | POA: Diagnosis not present

## 2022-03-10 DIAGNOSIS — E8809 Other disorders of plasma-protein metabolism, not elsewhere classified: Secondary | ICD-10-CM | POA: Diagnosis present

## 2022-03-10 DIAGNOSIS — R6889 Other general symptoms and signs: Secondary | ICD-10-CM | POA: Diagnosis not present

## 2022-03-10 DIAGNOSIS — R55 Syncope and collapse: Secondary | ICD-10-CM | POA: Diagnosis not present

## 2022-03-10 DIAGNOSIS — I10 Essential (primary) hypertension: Secondary | ICD-10-CM | POA: Diagnosis not present

## 2022-03-10 DIAGNOSIS — Z8673 Personal history of transient ischemic attack (TIA), and cerebral infarction without residual deficits: Secondary | ICD-10-CM | POA: Diagnosis not present

## 2022-03-10 DIAGNOSIS — R0602 Shortness of breath: Secondary | ICD-10-CM | POA: Diagnosis not present

## 2022-03-10 DIAGNOSIS — K2100 Gastro-esophageal reflux disease with esophagitis, without bleeding: Secondary | ICD-10-CM | POA: Diagnosis not present

## 2022-03-10 DIAGNOSIS — R413 Other amnesia: Secondary | ICD-10-CM | POA: Diagnosis not present

## 2022-03-10 DIAGNOSIS — N3289 Other specified disorders of bladder: Secondary | ICD-10-CM | POA: Diagnosis not present

## 2022-03-10 DIAGNOSIS — E871 Hypo-osmolality and hyponatremia: Secondary | ICD-10-CM | POA: Diagnosis not present

## 2022-03-10 DIAGNOSIS — N179 Acute kidney failure, unspecified: Secondary | ICD-10-CM | POA: Diagnosis not present

## 2022-03-10 DIAGNOSIS — N170 Acute kidney failure with tubular necrosis: Secondary | ICD-10-CM | POA: Diagnosis not present

## 2022-03-10 DIAGNOSIS — J189 Pneumonia, unspecified organism: Secondary | ICD-10-CM | POA: Diagnosis not present

## 2022-03-10 DIAGNOSIS — E876 Hypokalemia: Secondary | ICD-10-CM | POA: Diagnosis not present

## 2022-03-10 DIAGNOSIS — N182 Chronic kidney disease, stage 2 (mild): Secondary | ICD-10-CM | POA: Diagnosis not present

## 2022-03-10 DIAGNOSIS — Z515 Encounter for palliative care: Secondary | ICD-10-CM | POA: Diagnosis not present

## 2022-03-10 DIAGNOSIS — R001 Bradycardia, unspecified: Secondary | ICD-10-CM | POA: Diagnosis not present

## 2022-03-10 DIAGNOSIS — F411 Generalized anxiety disorder: Secondary | ICD-10-CM | POA: Diagnosis not present

## 2022-03-10 DIAGNOSIS — D649 Anemia, unspecified: Secondary | ICD-10-CM | POA: Diagnosis not present

## 2022-03-10 DIAGNOSIS — R112 Nausea with vomiting, unspecified: Secondary | ICD-10-CM | POA: Diagnosis not present

## 2022-03-10 DIAGNOSIS — E875 Hyperkalemia: Secondary | ICD-10-CM | POA: Diagnosis not present

## 2022-03-10 DIAGNOSIS — K8020 Calculus of gallbladder without cholecystitis without obstruction: Secondary | ICD-10-CM | POA: Diagnosis not present

## 2022-03-10 DIAGNOSIS — R2689 Other abnormalities of gait and mobility: Secondary | ICD-10-CM | POA: Diagnosis not present

## 2022-03-10 DIAGNOSIS — I499 Cardiac arrhythmia, unspecified: Secondary | ICD-10-CM | POA: Diagnosis not present

## 2022-03-10 DIAGNOSIS — D509 Iron deficiency anemia, unspecified: Secondary | ICD-10-CM | POA: Diagnosis not present

## 2022-03-10 DIAGNOSIS — I639 Cerebral infarction, unspecified: Secondary | ICD-10-CM | POA: Diagnosis not present

## 2022-03-10 DIAGNOSIS — R41841 Cognitive communication deficit: Secondary | ICD-10-CM | POA: Diagnosis not present

## 2022-03-10 DIAGNOSIS — E039 Hypothyroidism, unspecified: Secondary | ICD-10-CM | POA: Diagnosis not present

## 2022-03-10 DIAGNOSIS — F03B4 Unspecified dementia, moderate, with anxiety: Secondary | ICD-10-CM | POA: Diagnosis not present

## 2022-03-10 DIAGNOSIS — N3000 Acute cystitis without hematuria: Secondary | ICD-10-CM | POA: Diagnosis not present

## 2022-03-10 DIAGNOSIS — I739 Peripheral vascular disease, unspecified: Secondary | ICD-10-CM | POA: Diagnosis not present

## 2022-03-10 DIAGNOSIS — K5730 Diverticulosis of large intestine without perforation or abscess without bleeding: Secondary | ICD-10-CM | POA: Diagnosis not present

## 2022-03-10 DIAGNOSIS — D72829 Elevated white blood cell count, unspecified: Secondary | ICD-10-CM | POA: Diagnosis not present

## 2022-03-10 DIAGNOSIS — I5030 Unspecified diastolic (congestive) heart failure: Secondary | ICD-10-CM | POA: Diagnosis not present

## 2022-03-10 DIAGNOSIS — R1013 Epigastric pain: Secondary | ICD-10-CM | POA: Diagnosis not present

## 2022-03-10 DIAGNOSIS — F0390 Unspecified dementia without behavioral disturbance: Secondary | ICD-10-CM | POA: Diagnosis not present

## 2022-03-10 DIAGNOSIS — I130 Hypertensive heart and chronic kidney disease with heart failure and stage 1 through stage 4 chronic kidney disease, or unspecified chronic kidney disease: Secondary | ICD-10-CM | POA: Diagnosis present

## 2022-03-10 DIAGNOSIS — R079 Chest pain, unspecified: Secondary | ICD-10-CM | POA: Diagnosis not present

## 2022-03-10 LAB — URINE CULTURE
Culture: >=100000 — AB
Special Requests: NORMAL

## 2022-03-10 MED ORDER — FOSFOMYCIN TROMETHAMINE 3 G PO PACK: 3.0000 g | PACK | Freq: Once | ORAL | Status: DC

## 2022-03-10 MED ORDER — FUROSEMIDE 40 MG PO TABS: 20.0000 mg | ORAL_TABLET | Freq: Every day | ORAL | Status: AC | PRN

## 2022-03-10 MED ORDER — FOSFOMYCIN TROMETHAMINE 3 G PO PACK: 3.0000 g | PACK | Freq: Once | ORAL | 0 refills | Status: AC

## 2022-03-10 MED ORDER — CYANOCOBALAMIN 1000 MCG PO TABS: 1000.0000 ug | ORAL_TABLET | Freq: Every day | ORAL | 0 refills | Status: AC

## 2022-03-10 MED ORDER — SODIUM CHLORIDE 0.9 % IV SOLN
1.0000 g | INTRAVENOUS | Status: DC
  Administered 2022-03-10: 1 g via INTRAVENOUS
  Filled 2022-03-10: qty 10

## 2022-03-10 NOTE — Progress Notes (Signed)
Personal transport company to transport pt to SNF at [phone]

## 2022-03-10 NOTE — Discharge Summary (Signed)
Physician Discharge Summary   Patient: [name] MRN: [id number] DOB: 11/07/1924  Admit date:     03/06/2022  Discharge date: 03/10/22  Discharge Physician: Raiford Noble, DO   PCP: [name], MD   Recommendations at discharge:   Follow up with PCP within 1-2 weeks Follow up with Palliative Care in the outpatient setting   Discharge Diagnoses: Principal Problem:   Frequent falls with increased weakness Active Problems:   UTI (urinary tract infection)   Acute renal failure superimposed on stage 3b chronic kidney disease (HCC)   Paroxysmal atrial fibrillation (HCC)   Hypothyroidism   Chronic diastolic CHF (congestive heart failure) (HCC)   Essential hypertension   GERD   History of CVA (cerebrovascular accident)   Memory loss   Left breast mass  Resolved Problems:   * No resolved hospital problems. *  Hospital Course: [name] is a 88 y.o. female with medical history significant of  colon cancer, right breast cancer s/p mastectomy, recently found left breast mass concerning for cancer but not pursuing treatment, CHF, GERD, HTN, hypothyroidism, hx of CVA, PAF on no anticoagulation, Ckd stage 3 who has had recurrent falls. She fell today and EMS was called. She has had increasing weakness since her admit in 02/04/22. She was admitted for acute on chronic CHF and HTN emergency. Staff at her [facility] found her on the ground. Daughter does not think she was on the ground for long. Patient can give me no history and doesn't know whey she is here. She also has had poor PO intake.     He has been feeling good. Denies any fever/chills, vision changes/headaches, chest pain or palpitations, + shortness of breath, but no cough, abdominal pain, N/V/D, dysuria or leg swelling.    **Interim History  Palliative meeting with the patient's daughter and the patient today.  Urine Cx finally obtained and grew out Pan-sensitive Pseudomonas..  We will continue empiric treatment of her Pseudomonas  UTI but changed to IV Cefepime x2 and will change to po Fosfomycin to get at least 5 days.  PT recommending SNF and Palliative Care discussed with the patient daughter again and will continue her DNR status during at this time and current care and continue efforts at SNF. IF she fails to improve then palliative care to follow-up in the outpatient setting and then transition to hospice eventually.  Assessment and Plan: * Frequent falls with increased weakness -The patient is a 88 year old with history of worsening memory, FTT, and increased falls and weakness found on the ground after a fall at her independent living -Admitted to Obs Telemetry  -Stat head CT for unwitnessed fall done and showed "No evidence of acute intracranial abnormality. 2. Chronic small vessel ischemic disease and cerebral atrophy" -Urine suspicious for UTI with hx of UTI and not taking medication.  Urinalysis showed a cloudy appearance with small hemoglobin, large leukocytes, positive nitrites, rare bacteria, 6-10 non-squamous epithelial cells, 11-20 RBCs per high-power field, 11-20 squamous epithelial cells and greater than 50 WBCs with unfortunately no urine culture obtained -She was initiated on IV Ceftriaxone but urine culture growing greater than 100,000 colony-forming units of Pseudomonas aeruginosa so we will change antibiotics -Has stopped taking medication and TSH very abnormal in July. Repeat and was elevated again at 36.432. Re start home dose -Check B12 and was 103 and will need supplementation, CK was 226 -AKI on CKD-very gentle, time limited fluids; patient's BUN/creatinine went from 25/1.66 and is now 20/1.14 -Palliative care  consulted awaiting her to appointment -PT/OT/SW for placement as does not appear safe to return to [facility].  PT OT recommending SNF and she is getting close to being discharged -Continue Fall precautions    Pseudomonas UTI (urinary tract infection) -With frequent falls/worsening memory loss will  treat for UTI -Had a urine infection and daughter states she doubt she even took these -See above and continue IV Abx but will change given that she is growing Pseudomonas so I will change to IV cefepime instead of IV ceftriaxone -F/u on urine culture and final collected yesterday and growing out greater than 100,000 colony-forming units of Pseudomonas and this was Pan-Sensitive -Changed to IV Cefepime and received 2 doses and will change to po Fosfomycin tomorrow to cover her for the total of 5 days    Acute renal failure superimposed on stage 3b chronic kidney disease (HCC) -Baseline creatinine appears to be around 1.0-1.2 -UA suspicious for infection  -Culture not obtained currently -Strict I/O -Hold nephrotoxic medications, contrast dyes, hypotension and dehydration to ensure adequate renal perfusion and will need to renally dose medications -Patient's BUN/Cr is improved and went from 25/1.66 -> 20/1.14 -> 18/1.04 but is slowly gone up again and is now 26/1.18 on last check  -Was given IV fluids which are now stopped -Continue To monitor trend renal function carefully repeat CMP within 1 week    Paroxysmal atrial fibrillation (HCC) -Not on AC -Continue Amiodarone and metoprolol -Continue monitor on telemetry   Hypothyroidism -TSH 2 months ago was >35 with no repeat -Could be contributing to fall -Repeat TSH free T4 and was 36.42 with a free T4 of 0.66 respectively -Adjust medication as needed (she has not been taking this)  -Continue Levothyroxine 75 mcg p.o. daily for now and have her follow-up in outpatient setting with repeat TFT's   Chronic diastolic CHF (congestive heart failure) ([place]) -Does not appear clinically overloaded and CXR with no acute edema or LE swelling -She has not been taking her medication  -Echo 5/23: EF of 60-65%. Grade 2 DD.  -Has AKI, hold lasix monitor closely  -Strict I/O and is -1,130 mL since admission  -Continue with Isosorbide Mononitrate 30  minutes p.o. daily as well as Metoprolol Tartrate 25 mg p.o. twice daily   Essential Hypertension -Elevated, but has not been taking her medication  -Start back amlodipine, imdur, lopressor -Not taking her hydralazine  -Continue to monitor blood pressures per protocol -Last blood pressure reading was 137/67   Hypokalemia -Patient's potassium  yesterday was 4.6 -Magnesium level was 2.2 -Continue to monitor replete as necessary repeat CMP within 1 week   Normocytic Anemia -Patient's hemoglobin/hematocrit went from 12.8/41.8 -> 12.5/40.5 -> 11.7/38.1 -> 11.7/36.9 on last check -Checked Anemia Panel and it showed an iron level of 43, U IBC of 294, TIBC of 337, saturation ratios of 13%, ferritin level 13, folate 5.3, vitamin B12 5372 -Continue to Monitor for S/Sx of Bleeding; No overt bleeding noted -Repeat CBC within 1 week   Hypoalbuminemia -Patient's albumin level went from 3.9 -> 3.4 -> 3.2 x2 -Continue monitor and trend and repeat CMP   GERD/GI prophylaxis -Continue Nexium substitution with pantoprazole 80 mg p.o. daily   History of CVA (Cerebrovascular Accident) -Continue plavix/statin  -Head CT was done and resuming clopidogrel 75 mg p.o. daily as well as atorvastatin 40 g p.o. every evening   Memory Loss -No diagnosis of dementia, but daughter states her doctor states she has dementia -Daughter states she has had worsening memory loss  since may and personality -She has stopped taking medication and has poor PO intake -Daughter interested in hospice, palliative care consult -Delirium precautions -[name] daughter met with palliative care and she is completely on board with hospice philosophy, however she can't pay for 24/7 caregivers or LTC/ALF and can't bring her home from ILF as she has no help.  -She is agreeable to SNF for rehab with palliative; Per discussions would then transition to hospice rather than return to the hospital if (when) she declines from there.     Left Breast Mass -Concern for malignancy, have opted to not treat -Palliative care consult, interested in hospice    Consultants: Palliative Care Procedures performed: As above  Disposition: Skilled nursing facility Diet recommendation:  Discharge Diet Orders (From admission, onward)     Start     Ordered   03/10/22 0000  Diet - low sodium heart healthy        03/10/22 1150           Cardiac diet DISCHARGE MEDICATION: Allergies as of 03/10/2022       Reactions   Codeine Itching, Anxiety, Other (See Comments)   Caused the patient to feel nervous/confused.   Pregabalin Anxiety, Other (See Comments)   Caused nervousness        Medication List     STOP taking these medications    hydrALAZINE 10 MG tablet Commonly known as: APRESOLINE       TAKE these medications    acetaminophen 500 MG tablet Commonly known as: TYLENOL Take 1,000 mg by mouth every 6 (six) hours as needed for headache (pain).   amiodarone 200 MG tablet Commonly known as: PACERONE Take 200 mg by mouth daily at 12 noon.   amLODipine 5 MG tablet Commonly known as: NORVASC Take 5 mg by mouth daily.   atorvastatin 40 MG tablet Commonly known as: LIPITOR Take 1 tablet (40 mg total) by mouth daily at 6 PM. What changed: when to take this   clopidogrel 75 MG tablet Commonly known as: PLAVIX Take 1 tablet (75 mg total) by mouth daily. What changed: when to take this   cyanocobalamin 1000 MCG tablet Take 1 tablet (1,000 mcg total) by mouth daily. Start taking on: March 11, 2022   diclofenac sodium 1 % Gel Commonly known as: VOLTAREN APPLY 2-4 GRAMS TO AFFECTED AREA 2-3 TIMES A DAY AS NEEDED. What changed: See the new instructions.   esomeprazole 40 MG capsule Commonly known as: NEXIUM Take 40 mg by mouth every morning.   fosfomycin 3 g Pack Commonly known as: MONUROL Take 3 g by mouth once for 1 dose. Start taking on: March 11, 2022   furosemide 40 MG tablet Commonly known  as: LASIX Take 0.5 tablets (20 mg total) by mouth daily as needed for fluid or edema. What changed:  when to take this reasons to take this   isosorbide mononitrate 30 MG 24 hr tablet Commonly known as: IMDUR Take 30 mg by mouth every morning.   levothyroxine 75 MCG tablet Commonly known as: SYNTHROID Take 1 tablet (75 mcg total) by mouth daily. What changed: when to take this   losartan 25 MG tablet Commonly known as: COZAAR Take 25 mg by mouth daily.   melatonin 3 MG Tabs tablet Take 3 mg by mouth at bedtime as needed (sleep).   metoprolol tartrate 25 MG tablet Commonly known as: LOPRESSOR Take 25 mg by mouth See admin instructions. Take one tablet (25 mg) by mouth twice  daily - noon and evening   Multivitamin Adults 50+ Tabs Take 1 tablet by mouth daily.   nitroGLYCERIN 0.4 MG SL tablet Commonly known as: NITROSTAT Place 1 tablet (0.4 mg total) under the tongue every 5 (five) minutes x 3 doses as needed for chest pain.   potassium chloride SA 20 MEQ tablet Commonly known as: Klor-Con M20 Take 1 tablet (20 mEq total) by mouth every Monday, Wednesday, and Friday. What changed: when to take this   QUEtiapine 25 MG tablet Commonly known as: SEROQUEL Take 25 mg by mouth at bedtime.   Systane 0.4-0.3 % Gel ophthalmic gel Generic drug: Polyethyl Glycol-Propyl Glycol Place 1 application into both eyes at bedtime.        Discharge Exam: Filed Weights   03/06/22 1201  Weight: 70 kg   Vitals:   03/10/22 0829 03/10/22 1025  BP: 137/67   Pulse: 62 64  Resp: 17   Temp: (!) 97.5 F (36.4 C)   SpO2: 97%    Examination: Physical Exam:  Constitutional: Thin elderly Caucasian female currently no acute distress sitting in a chair and has no real complaints Respiratory: Diminished to auscultation bilaterally, no wheezing, rales, rhonchi or crackles. Normal respiratory effort and patient is not tachypenic. No accessory muscle use.  Unlabored breathing Cardiovascular:  RRR, no murmurs / rubs / gallops. S1 and S2 auscultated. No extremity edema. Abdomen: Soft, non--tender, non-distended. Bowel sounds positive.  GU: Deferred. Musculoskeletal: No clubbing / cyanosis of digits/nails. No joint deformity upper and lower extremities.  Skin: No rashes, lesions, ulcers on limited skin evaluation. No induration; Warm and dry.  Neurologic: CN 2-12 grossly intact with no focal deficits. Romberg sign cerebellar reflexes not assessed.   Condition at discharge: stable  The results of significant diagnostics from this hospitalization (including imaging, microbiology, ancillary and laboratory) are listed below for reference.   Imaging Studies: CT HEAD WO CONTRAST (5MM)  Result Date: 03/06/2022 CLINICAL DATA:  Head trauma, moderate-severe. Fall. History of dementia. EXAM: CT HEAD WITHOUT CONTRAST TECHNIQUE: Contiguous axial images were obtained from the base of the skull through the vertex without intravenous contrast. RADIATION DOSE REDUCTION: This exam was performed according to the departmental dose-optimization program which includes automated exposure control, adjustment of the mA and/or kV according to patient size and/or use of iterative reconstruction technique. COMPARISON:  Head CT 02/05/2022 and MRI 01/01/2022 FINDINGS: Brain: There is no evidence of an acute infarct, intracranial hemorrhage, mass, midline shift, or extra-axial fluid collection. Patchy hypodensities in the cerebral white matter bilaterally are unchanged and nonspecific but compatible with mild chronic small vessel ischemic disease, and small chronic right cerebellar infarcts are again seen. There is moderate cerebral atrophy. Heterogeneous hypoattenuation of the basal ganglia corresponds to dilated perivascular spaces on MRI. Scattered chronic sulcal calcifications are again noted. Vascular: Calcified atherosclerosis at the skull base. Skull: No fracture or suspicious osseous lesion. Sinuses/Orbits:  Visualized paranasal sinuses and mastoid air cells are clear. Bilateral cataract extraction. Other: None. IMPRESSION: 1. No evidence of acute intracranial abnormality. 2. Chronic small vessel ischemic disease and cerebral atrophy. Electronically Signed   By: Logan Bores M.D.   On: 03/06/2022 20:04   DG Knee Complete 4 Views Right  Result Date: 03/06/2022 CLINICAL DATA:  Right knee pain after fall. EXAM: RIGHT KNEE - COMPLETE 4+ VIEW COMPARISON:  None Available. FINDINGS: No evidence of fracture, dislocation, or joint effusion. Severe narrowing of medial joint space is noted. Chondrocalcinosis is noted laterally. Soft tissues are unremarkable. IMPRESSION: Severe degenerative  joint disease.  No acute abnormality seen. Electronically Signed   By: Marijo Conception M.D.   On: 03/06/2022 13:36   DG Chest Port 1 View  Result Date: 03/06/2022 CLINICAL DATA:  Fall. EXAM: PORTABLE CHEST 1 VIEW COMPARISON:  March 04, 2022. FINDINGS: Stable cardiomegaly. Mild bibasilar subsegmental atelectasis or scarring is noted. Old right clavicular fracture. IMPRESSION: Mild bibasilar subsegmental atelectasis or scarring. Aortic Atherosclerosis (ICD10-I70.0). Electronically Signed   By: Marijo Conception M.D.   On: 03/06/2022 13:34   DG Pelvis 1-2 Views  Result Date: 03/06/2022 CLINICAL DATA:  Fall. EXAM: PELVIS - 1-2 VIEW COMPARISON:  February 22, 2014. FINDINGS: There is no evidence of pelvic fracture or diastasis. Status post left hip arthroplasty. No pelvic bone lesions are seen. IMPRESSION: No acute abnormality is noted. Electronically Signed   By: Marijo Conception M.D.   On: 03/06/2022 13:33   DG Chest 2 View  Result Date: 03/04/2022 CLINICAL DATA:  Shoulder pain. EXAM: CHEST - 2 VIEW COMPARISON:  February 28, 2022. FINDINGS: Stable cardiomediastinal silhouette. Old right clavicular fracture is noted. Surgical clips are noted in right axillary region. Mild bibasilar subsegmental atelectasis or scarring is noted. Bony thorax is  unremarkable. IMPRESSION: Mild bibasilar subsegmental atelectasis or scarring. Aortic Atherosclerosis (ICD10-I70.0). Electronically Signed   By: Marijo Conception M.D.   On: 03/04/2022 13:05   DG Hip Unilat W or Wo Pelvis 2-3 Views Right  Result Date: 03/04/2022 CLINICAL DATA:  Right hip pain. EXAM: DG HIP (WITH OR WITHOUT PELVIS) 2-3V RIGHT COMPARISON:  None Available. FINDINGS: There is no evidence of hip fracture or dislocation. There is no evidence of arthropathy or other focal bone abnormality. IMPRESSION: Negative. Electronically Signed   By: Marijo Conception M.D.   On: 03/04/2022 13:03   DG Shoulder Left  Result Date: 03/04/2022 CLINICAL DATA:  Left shoulder pain. EXAM: LEFT SHOULDER - 2+ VIEW COMPARISON:  March 10, 2020. FINDINGS: There is no definite evidence of fracture. Probable severe degenerative changes seen involving the glenohumeral joint as noted on prior exam. Probable mild anterior subluxation of the humeral head relative to glenoid fossa is noted as well. IMPRESSION: No definite fracture is noted. Severe osteoarthritis of the glenohumeral joint. Probable mild anterior subluxation of the humeral head relative to glenoid fossa. Electronically Signed   By: Marijo Conception M.D.   On: 03/04/2022 13:01   DG Knee Complete 4 Views Right  Result Date: 03/04/2022 CLINICAL DATA:  Right knee pain. EXAM: RIGHT KNEE - COMPLETE 4+ VIEW COMPARISON:  January 26, 2022. FINDINGS: No fracture or dislocation is noted. Small suprapatellar joint effusion is noted. Severe narrowing is seen involving the medial joint space with osteophyte formation. Mild narrowing and chondrocalcinosis is noted laterally. IMPRESSION: Small suprapatellar joint effusion. Severe degenerative joint disease is noted medially. No fracture or dislocation is noted. Electronically Signed   By: Marijo Conception M.D.   On: 03/04/2022 12:58   DG Chest Port 1 View  Result Date: 02/28/2022 CLINICAL DATA:  Chest pain.  Anxiety. EXAM: PORTABLE  CHEST 1 VIEW COMPARISON:  02/26/2022 FINDINGS: Artifact overlies the chest. Chronic cardiomegaly and aortic atherosclerosis. Mildly prominent chronic interstitial lung markings. No evidence of active infiltrate, mass, effusion or collapse. Surgical clips of the right chest wall. Chronic degenerative changes of the shoulders left worse than right. IMPRESSION: No active disease. Cardiomegaly. Aortic atherosclerosis. Chronic pulmonary scarring. Electronically Signed   By: Nelson Chimes M.D.   On: 02/28/2022 13:04  DG Chest 2 View  Result Date: 02/26/2022 CLINICAL DATA:  Shortness of breath EXAM: CHEST - 2 VIEW COMPARISON:  02/24/2022 FINDINGS: Lungs are clear.  No pleural effusion or pneumothorax. The heart is top-normal in size.  Thoracic aortic atherosclerosis. Degenerative changes of the left shoulder. Surgical clips along the right lateral chest wall/axilla. Mild degenerative changes of the visualized thoracolumbar spine. IMPRESSION: No evidence of acute cardiopulmonary disease. Electronically Signed   By: Julian Hy M.D.   On: 02/26/2022 18:34   DG Chest 1 View  Result Date: 02/24/2022 CLINICAL DATA:  Chest pain, epigastric pain. Pain radiates to left arm. EXAM: CHEST  1 VIEW COMPARISON:  Chest radiograph 02/18/2022, chest CT 02/16/2022 FINDINGS: Stable cardiomegaly. Unchanged mediastinal contours with aortic atherosclerosis. There is new interstitial and septal thickening typical of pulmonary edema. No confluent airspace disease or large pleural effusion. There is mild atelectasis at the right lung base. Chronic deformity of the left proximal humerus. Right axillary surgical clips. IMPRESSION: New interstitial and septal thickening typical of pulmonary edema. Stable cardiomegaly. Electronically Signed   By: Keith Rake M.D.   On: 02/24/2022 18:03   DG Chest 2 View  Result Date: 02/18/2022 CLINICAL DATA:  Pt bib ems from [place] c/o sob and left arm pain. EMS  stated pt has a mass on heart and has been dealing with symptoms since June. Pt has a UTI and confirms she is up to date on medications EXAM: CHEST - 2 VIEW COMPARISON:  02/16/2022 FINDINGS: Improved aeration in the lung bases. Old left first and second rib fracture deformity. Heart size and mediastinal contours are within normal limits. Aortic Atherosclerosis (ICD10-170.0). No effusion. Surgical clips right axilla.  Bilateral shoulder DJD. IMPRESSION: Chronic and postop changes.  No acute findings. Electronically Signed   By: Lucrezia Europe M.D.   On: 02/18/2022 16:35   CT Angio Chest PE W/Cm &/Or Wo Cm  Result Date: 02/16/2022 CLINICAL DATA:  88 year old female with dyspnea, positive D-dimer; PE suspected EXAM: CT ANGIOGRAPHY CHEST WITH CONTRAST TECHNIQUE: Multidetector CT imaging of the chest was performed using the standard protocol during bolus administration of intravenous contrast. Multiplanar CT image reconstructions and MIPs were obtained to evaluate the vascular anatomy. RADIATION DOSE REDUCTION: This exam was performed according to the departmental dose-optimization program which includes automated exposure control, adjustment of the mA and/or kV according to patient size and/or use of iterative reconstruction technique. CONTRAST:  161m OMNIPAQUE IOHEXOL 350 MG/ML SOLN COMPARISON:  Radiographs earlier today and CT chest 02/04/2022 FINDINGS: Cardiovascular: Satisfactory opacification of the pulmonary arteries to the segmental level. No evidence of pulmonary embolism. Mild cardiomegaly. Aortic and coronary artery atherosclerotic calcification. Mitral annular and aortic valve calcification. No pericardial effusion. Mediastinum/Nodes: No thoracic adenopathy by size. Unremarkable thyroid. Small hiatal hernia. The trachea is patent. Lungs/Pleura: Irregular peripheral predominant interlobular septal thickening, bronchial wall thickening greatest in the lower lobes and mosaic attenuation compatible with air  trapping. Scattered mucous plugging. No pleural effusion or pneumothorax. Upper Abdomen: Cholecystectomy. No acute abnormality in the upper abdomen. Musculoskeletal: Postoperative changes of right mastectomy. Redemonstrated lobulated mass in the medial left breast measuring 2.4 x 2.3 cm. Review of the MIP images confirms the above findings. IMPRESSION: Negative for acute pulmonary embolism. Bronchial wall thickening and mucous plugging with air trapping consistent with airway infection/inflammation, similar to prior. Interstitial thickening greatest in the lower lungs may be related to pulmonary fibrosis though superimposed edema could appear similarly. Cardiomegaly and coronary artery disease. Aortic valve calcification.  Aortic Atherosclerosis (ICD10-I70.0). Electronically Signed   By: Placido Sou M.D.   On: 02/16/2022 18:33   DG Chest Port 1 View  Result Date: 02/16/2022 CLINICAL DATA:  A 88 year old female presenting with shortness of breath. EXAM: PORTABLE CHEST 1 VIEW COMPARISON:  February 04, 2022 FINDINGS: EKG leads project over the chest. Mild rotation to the RIGHT on this image, accounting for this cardiomediastinal contours and hilar structures are stable with moderate to marked cardiac enlargementw. No sign of lobar consolidation. No visible pneumothorax. No gross pleural effusion. Mild increased interstitial markings are similar to previous imaging. On limited assessment no acute skeletal findings. IMPRESSION: 1. Stable moderate to marked cardiac enlargement. 2. Mild increased interstitial markings are stable and may reflect background chronic changes, difficult to exclude mild superimposed edema. Electronically Signed   By: Zetta Bills M.D.   On: 02/16/2022 15:08    Microbiology: Results for orders placed or performed during the hospital encounter of 03/06/22  Urine Culture     Status: Abnormal   Collection Time: 03/06/22  4:20 PM   Specimen: Urine, Clean Catch  Result Value Ref Range  Status   Specimen Description URINE, CLEAN CATCH  Final   Special Requests   Final    Normal Performed at [facility], [place] [address], [place], [place] [address]    Culture >=100,000 COLONIES/mL PSEUDOMONAS AERUGINOSA (A)  Final   Report Status 03/10/2022 FINAL  Final   Organism ID, Bacteria PSEUDOMONAS AERUGINOSA (A)  Final      Susceptibility   Pseudomonas aeruginosa - MIC*    CEFTAZIDIME 4 SENSITIVE Sensitive     CIPROFLOXACIN <=0.25 SENSITIVE Sensitive     GENTAMICIN <=1 SENSITIVE Sensitive     IMIPENEM 1 SENSITIVE Sensitive     PIP/TAZO 8 SENSITIVE Sensitive     CEFEPIME 2 SENSITIVE Sensitive     * >=100,000 COLONIES/mL PSEUDOMONAS AERUGINOSA   Labs: CBC: Recent Labs  Lab 03/04/22 1200 03/06/22 1248 03/07/22 0019 03/08/22 0045 03/09/22 0105  WBC 6.0 10.9* 8.1 7.8 7.4  NEUTROABS  --  7.5  --  4.4 4.7  HGB 13.0 12.8 12.5 11.7* 11.7*  HCT 42.0 41.8 40.5 38.1 36.9  MCV 85.9 86.7 86.0 86.2 85.0  PLT 241 212 [phone]   Basic Metabolic Panel: Recent Labs  Lab 03/04/22 1200 03/06/22 1248 03/07/22 0019 03/08/22 0045 03/09/22 0105  NA 136 137 135 136 134*  K 4.1 3.9 3.3* 4.4 4.6  CL 104 101 100 105 103  CO2 _0 GLUCOSE 108* 117* 95 119* 114*  BUN 20 25* 20 18 26*  CREATININE 1.09* 1.66* 1.14* 1.04* 1.18*  CALCIUM 9.8 10.1 9.5 9.7 9.9  MG  --   --   --  2.2 2.2  PHOS  --   --   --  3.4 4.3   Liver Function Tests: Recent Labs  Lab 03/06/22 1248 03/08/22 0045 03/09/22 0105  AST _1 ALT _2 ALKPHOS 49 50 50  BILITOT 0.5 0.5 0.6  PROT 7.1 6.4* 6.0*  ALBUMIN 3.9 3.4* 3.2*   CBG: No results for input(s): "GLUCAP" in the last 168 hours.  Discharge time spent: greater than 30 minutes.  Signed: Raiford Noble, DO [facility] 03/10/2022

## 2022-03-10 NOTE — Progress Notes (Signed)
Report called to RN at [facility] at this time.

## 2022-03-10 NOTE — Progress Notes (Signed)
Occupational Therapy Treatment Patient Details Name: [name] MRN: [id number] DOB: 12-24-1924 Today's Date: 03/10/2022   History of present illness [name] is a 88 y.o. female with medical history significant of  colon cancer, right breast cancer s/p mastectomy, recently found left breast mass concerning for cancer but not pursuing treatment, CHF, GERD, HTN, hypothyroidism, hx of CVA, PAF on no anticoagulation, Ckd stage 3 who has had recurrent falls. She fell today and EMS was called. She has had increasing weakness since her admit in 02/04/22. She was admitted for acute on chronic CHF and HTN emergency. Staff at her [facility] found her on the ground. Daughter does not think she was on the ground for long. Patient can give me no history and doesn't know whey she is here. She also has had poor PO intake.   OT comments  Pt progressing towards OT goals. Session focused on sponge bathing sitting EOB and toilet transfers. Overall, pt requires Setup for UB ADLs and min guard- Min A for LB ADLs. Pt able to complete various transfers using RW and walk a short distance in the room with Min A for balance and RW maneuvering. As pt at increased risk for falls during ADLs/mobility, continue to rec SNF rehab at DC.    Recommendations for follow up therapy are one component of a multi-disciplinary discharge planning process, led by the attending physician.  Recommendations may be updated based on patient status, additional functional criteria and insurance authorization.    Follow Up Recommendations  Skilled nursing-short term rehab (<3 hours/day)    Assistance Recommended at Discharge Frequent or constant Supervision/Assistance  Patient can return home with the following  A little help with walking and/or transfers;A lot of help with bathing/dressing/bathroom;Assistance with cooking/housework;Direct supervision/assist for medications management;Direct supervision/assist for financial management;Assist for  transportation;Help with stairs or ramp for entrance   Equipment Recommendations  None recommended by OT    Recommendations for Other Services      Precautions / Restrictions Precautions Precautions: Fall Restrictions Weight Bearing Restrictions: No       Mobility Bed Mobility Overal bed mobility: Needs Assistance Bed Mobility: Supine to Sit     Supine to sit: Min assist     General bed mobility comments: Min handheld assist to pull to sit    Transfers Overall transfer level: Needs assistance Equipment used: Rolling walker (2 wheels) Transfers: Sit to/from Stand, Bed to chair/wheelchair/BSC Sit to Stand: Min assist     Step pivot transfers: Min assist     General transfer comment: Min A to rise from bedside but able to stand from [facility] pushing from armrests without assist. Min A for manuevering RW to turn toward [facility] and recliner afterwards     Balance Overall balance assessment: Needs assistance Sitting-balance support: Feet unsupported, No upper extremity supported Sitting balance-Leahy Scale: Good     Standing balance support: Bilateral upper extremity supported, During functional activity Standing balance-Leahy Scale: Fair Standing balance comment: able to stand without UE support for peri care                           ADL either performed or assessed with clinical judgement   ADL Overall ADL's : Needs assistance/impaired Eating/Feeding: Set up;Sitting   Grooming: Set up;Sitting;Wash/dry face   Upper Body Bathing: Set up;Sitting   Lower Body Bathing: Minimal assistance;Sit to/from stand Lower Body Bathing Details (indicate cue type and reason): assist for thorough posterior peri care when  smear noted Upper Body Dressing : Set up;Sitting       Toilet Transfer: Minimal assistance;Stand-pivot;BSC/3in1;Rolling walker (2 wheels) Toilet Transfer Details (indicate cue type and reason): assist to stand from bedside and manuever RW  appropriately Toileting- Clothing Manipulation and Hygiene: Sit to/from stand;Minimal assistance Toileting - Clothing Manipulation Details (indicate cue type and reason): able to perform hygiene in standing though assist needed for thoroughness     Functional mobility during ADLs: Minimal assistance;Rolling walker (2 wheels) General ADL Comments: Guided pt in sponge bathing sitting EOB with active engagement though fatigued quickly    Extremity/Trunk Assessment Upper Extremity Assessment Upper Extremity Assessment: Generalized weakness   Lower Extremity Assessment Lower Extremity Assessment: Defer to PT evaluation        Vision   Vision Assessment?: No apparent visual deficits   Perception     Praxis      Cognition Arousal/Alertness: Awake/alert Behavior During Therapy: WFL for tasks assessed/performed Overall Cognitive Status: No family/caregiver present to determine baseline cognitive functioning Area of Impairment: Orientation, Memory, Awareness, Problem solving                 Orientation Level: Time, Situation   Memory: Decreased short-term memory     Awareness: Emergent Problem Solving: Slow processing General Comments: pleasant, participatory. follows directions consistently (with HOH in mind)        Exercises      Shoulder Instructions       General Comments      Pertinent Vitals/ Pain       Pain Assessment Pain Assessment: Faces Faces Pain Scale: Hurts a little bit Pain Location: BLE Pain Descriptors / Indicators: Sore Pain Intervention(s): Monitored during session  Home Living                                          Prior Functioning/Environment              Frequency  Min 2X/week        Progress Toward Goals  OT Goals(current goals can now be found in the care plan section)  Progress towards OT goals: Progressing toward goals  Acute Rehab OT Goals Patient Stated Goal: have some coffee, feel  better OT Goal Formulation: With patient Time For Goal Achievement: 03/21/22 Potential to Achieve Goals: Good ADL Goals Pt Will Perform Upper Body Dressing: with modified independence;sitting Pt Will Perform Lower Body Dressing: sitting/lateral leans;with min assist Pt Will Transfer to Toilet: with supervision;ambulating;regular height toilet Additional ADL Goal #1: pt will complete bed mobility with supervision in prep for ADLs  Plan Discharge plan remains appropriate    Co-evaluation                 AM-PAC OT "6 Clicks" Daily Activity     Outcome Measure   Help from another person eating meals?: A Little Help from another person taking care of personal grooming?: A Little Help from another person toileting, which includes using toliet, bedpan, or urinal?: A Little Help from another person bathing (including washing, rinsing, drying)?: A Little Help from another person to put on and taking off regular upper body clothing?: A Little Help from another person to put on and taking off regular lower body clothing?: A Lot 6 Click Score: 17    End of Session Equipment Utilized During Treatment: Rolling walker (2 wheels)  OT Visit Diagnosis: Unsteadiness on feet (R26.81);Other  abnormalities of gait and mobility (R26.89);Muscle weakness (generalized) (M62.81)   Activity Tolerance Patient tolerated treatment well   Patient Left in chair;with call bell/phone within reach;with chair alarm set   Nurse Communication Mobility status        Time: 4193-7902 OT Time Calculation (min): 25 min  Charges: OT General Charges $OT Visit: 1 Visit OT Treatments $Self Care/Home Management : 23-37 mins  Malachy Chamber, OTR/L Acute Rehab Services Office: [phone]   Layla Maw 03/10/2022, 7:58 AM

## 2022-03-10 NOTE — NC FL2 (Signed)
[place] LEVEL OF CARE SCREENING TOOL     IDENTIFICATION  Patient Name: [name] Birthdate: 06/27/1925 Sex: female Admission Date (Current Location): 03/06/2022  [facility] and [place] Number:  Herbalist and Address:  [facility] [place], [place] [address] [place], [place] [address]      Provider Number: [id number]  Attending Physician Name and Address:  Kerney Elbe, DO  Relative Name and Phone Number:  Ridge,[name] (Daughter)   [phone]    Current Level of Care: Hospital Recommended Level of Care: [place] Prior Approval Number:    Date Approved/Denied:   PASRR Number: [id number]  Discharge Plan: SNF    Current Diagnoses: Patient Active Problem List   Diagnosis Date Noted   Frequent falls with increased weakness 03/06/2022   Chronic diastolic CHF (congestive heart failure) ([place]) 03/06/2022   Memory loss 03/06/2022   Acute on chronic heart failure with preserved ejection fraction (HFpEF) ([place]) 02/04/2022   Hypertensive emergency 02/04/2022   Paroxysmal atrial fibrillation ([place]) 02/04/2022   History of CVA (cerebrovascular accident) 02/04/2022   Left breast mass 94/70/9628   Acute metabolic encephalopathy 36/62/9476   UTI (urinary tract infection) 01/01/2022   Altered mental status    Acute diastolic heart failure ([place]) 11/29/2021   Acute coronary syndrome ([place]) 08/30/2019   TIA (transient ischemic attack)    CVA (cerebral vascular accident) ([place]) 05/18/2019   Acute renal failure superimposed on stage 3b chronic kidney disease ([place]) 05/18/2019   History of breast cancer 05/18/2019   History of colon cancer 05/18/2019   Hypochromic anemia 05/18/2019   Rectal cancer ([place]) 12/16/2015   Depression 03/07/2014   Hip fracture ([place]) 02/22/2014   Fall 02/21/2014   Fracture of femoral neck, left ([place]) 02/21/2014   Diarrhea 05/07/2012   Acute pulmonary embolism ([place]) 01/04/2012   Acute pericardial  effusion 01/04/2012   Fracture of medial malleolus, left, closed, nondisplaced -12/19/2011 12/22/2011   Fracture of left distal radius - 12/19/2011 12/21/2011   Fracture of lateral malleolus of right ankle - 12/19/2011 12/21/2011   Foot fracture, left - 12/19/2011 12/21/2011   Closed fracture of distal clavicle 12/21/2011   PERIPHERAL VASCULAR DISEASE 03/17/2008   GASTRITIS 11/29/2007   ADENOCARCINOMA, BREAST 10/16/2007   VENOUS INSUFFICIENCY 10/16/2007   Hypothyroidism 04/23/2007   ANXIETY 04/23/2007   Essential hypertension 54/65/0354   Diastolic dysfunction, left ventricle as evidenced by moderate LVH on echocardiogram June 2013 04/23/2007   GERD 04/23/2007    Orientation RESPIRATION BLADDER Height & Weight     Self, Time, Situation, Place  Normal Continent Weight: 154 lb 5.2 oz (70 kg) Height:  '5\' 7"'$  (170.2 cm)  BEHAVIORAL SYMPTOMS/MOOD NEUROLOGICAL BOWEL NUTRITION STATUS      Continent Diet (See DC Summary)  AMBULATORY STATUS COMMUNICATION OF NEEDS Skin   Limited Assist Verbally Normal                       Personal Care Assistance Level of Assistance  Bathing, Feeding, Dressing Bathing Assistance: Limited assistance Feeding assistance: Independent Dressing Assistance: Limited assistance     Functional Limitations Info  Sight, Hearing, Speech Sight Info: Impaired Hearing Info: Impaired Speech Info: Adequate    SPECIAL CARE FACTORS FREQUENCY  PT (By licensed PT), OT (By licensed OT)     PT Frequency: 5x a week OT Frequency: 5x a week            Contractures Contractures Info: Not present  Additional Factors Info  Code Status, Allergies Code Status Info: DNR Allergies Info: Codeine   Pregabalin           Current Medications (03/10/2022):  This is the current hospital active medication list Current Facility-Administered Medications  Medication Dose Route Frequency Provider Last Rate Last Admin   acetaminophen (TYLENOL) tablet 650 mg  650 mg  Oral Q6H PRN Orma Flaming, MD   650 mg at 03/10/22 1030   Or   acetaminophen (TYLENOL) suppository 650 mg  650 mg Rectal Q6H PRN Orma Flaming, MD       alum & mag hydroxide-simeth (MAALOX/MYLANTA) 200-200-20 MG/5ML suspension 30 mL  30 mL Oral Q4H PRN Raiford Noble [place], DO   30 mL at 03/09/22 2017   amiodarone (PACERONE) tablet 200 mg  200 mg Oral Q1200 Orma Flaming, MD   200 mg at 03/10/22 1025   amLODipine (NORVASC) tablet 5 mg  5 mg Oral Daily Orma Flaming, MD   5 mg at 03/10/22 1024   atorvastatin (LIPITOR) tablet 40 mg  40 mg Oral QPM Orma Flaming, MD   40 mg at 03/09/22 1825   ceFEPIme (MAXIPIME) 1 g in sodium chloride 0.9 % 100 mL IVPB  1 g Intravenous Q24H Sheikh, Omair Latif, DO       clopidogrel (PLAVIX) tablet 75 mg  75 mg Oral Q1200 Orma Flaming, MD   75 mg at 03/10/22 1024   cyanocobalamin (VITAMIN B12) tablet 1,000 mcg  1,000 mcg Oral Daily Raiford Noble [place], DO   1,000 mcg at 03/10/22 1024   [START ON 03/11/2022] fosfomycin (MONUROL) packet 3 g  3 g Oral Once [facility], Omair Latif, DO       hydrALAZINE (APRESOLINE) injection 10 mg  10 mg Intravenous Q6H PRN Sheikh, Omair Latif, DO   10 mg at 03/08/22 1248   isosorbide mononitrate (IMDUR) 24 hr tablet 30 mg  30 mg Oral q morning Orma Flaming, MD   30 mg at 03/10/22 1024   levothyroxine (SYNTHROID) tablet 75 mcg  75 mcg Oral Q0600 Orma Flaming, MD   75 mcg at 03/10/22 0530   metoprolol tartrate (LOPRESSOR) tablet 25 mg  25 mg Oral BID Orma Flaming, MD   25 mg at 03/10/22 1024   pantoprazole (PROTONIX) EC tablet 80 mg  80 mg Oral Q1200 Orma Flaming, MD   80 mg at 03/10/22 1024   QUEtiapine (SEROQUEL) tablet 25 mg  25 mg Oral QHS Orma Flaming, MD   25 mg at 03/09/22 2018     Discharge Medications: Please see discharge summary for a list of discharge medications.  Relevant Imaging Results:  Relevant Lab Results:   Additional Information SSN: [id number]  Reece Agar, [place]

## 2022-03-10 NOTE — TOC Progression Note (Signed)
Transition of Care [facility]) - Progression Note    Patient Details  Name: [name] MRN: [id number] Date of Birth: 13-Sep-1924  Transition of [facility]) CM/SW Contact  Reece Agar, [place] Phone Number: 03/10/2022, 11:33 AM  Clinical Narrative:    CSW spoke with Andee Poles with [employer] who updated CSW on family's DC plan for transportation to [place] today.   Andee Poles will set up private transport through [employer], they will bring a wheelchair to transport and pt's daughter will meet the pt at [employer].        Expected Discharge Plan and Services                                                 Social Determinants of Health (SDOH) Interventions    Readmission Risk Interventions     No data to display

## 2022-03-10 NOTE — TOC Transition Note (Signed)
Transition of Care [facility]) - CM/SW Discharge Note   Patient Details  Name: [name] MRN: [id number] Date of Birth: [birth year]  Transition of Care [facility]) CM/SW Contact:  Tresa Endo Phone Number: 03/10/2022, 11:39 AM   Clinical Narrative:    Patient will DC to: [facility] Anticipated DC date: 03/10/2022 Family notified: Pt Daughter and Corporate investment banker by: [employer]    Per MD patient ready for DC to [facility]. RN to call report prior to discharge ([phone]). RN, patient, patient's family, and facility notified of DC. Discharge Summary and FL2 sent to facility. DC packet on chart. Ambulance transport requested for patient.   CSW will sign off for now as social work intervention is no longer needed. Please consult [place] again if new needs arise.     Final next level of care: Skilled Nursing Facility Barriers to Discharge: Continued Medical Work up   Patient Goals and CMS Choice Patient states their goals for this hospitalization and ongoing recovery are:: Rehab CMS Medicare.gov Compare Post Acute Care list provided to:: Patient Choice offered to / list presented to : Adult Children Engineer, manufacturing systems)  Discharge Placement                       Discharge Plan and Services In-house Referral: Clinical Social Work   Post Acute Care Choice: [place]                               Social Determinants of Health (SDOH) Interventions     Readmission Risk Interventions     No data to display

## 2022-03-11 ENCOUNTER — Other Ambulatory Visit: Payer: BLUE CROSS/BLUE SHIELD | Admitting: Family Medicine

## 2022-03-15 DIAGNOSIS — I639 Cerebral infarction, unspecified: Secondary | ICD-10-CM | POA: Diagnosis not present

## 2022-03-15 DIAGNOSIS — I5032 Chronic diastolic (congestive) heart failure: Secondary | ICD-10-CM | POA: Diagnosis not present

## 2022-03-15 DIAGNOSIS — I10 Essential (primary) hypertension: Secondary | ICD-10-CM | POA: Diagnosis not present

## 2022-03-15 DIAGNOSIS — I480 Paroxysmal atrial fibrillation: Secondary | ICD-10-CM | POA: Diagnosis not present

## 2022-03-16 ENCOUNTER — Non-Acute Institutional Stay: Payer: Medicare Other | Admitting: Internal Medicine

## 2022-03-16 ENCOUNTER — Encounter: Payer: Self-pay | Admitting: Internal Medicine

## 2022-03-16 VITALS — Ht 67.0 in

## 2022-03-16 DIAGNOSIS — N6320 Unspecified lump in the left breast, unspecified quadrant: Secondary | ICD-10-CM

## 2022-03-16 DIAGNOSIS — R634 Abnormal weight loss: Secondary | ICD-10-CM

## 2022-03-16 DIAGNOSIS — N1831 Chronic kidney disease, stage 3a: Secondary | ICD-10-CM | POA: Diagnosis not present

## 2022-03-16 DIAGNOSIS — Z515 Encounter for palliative care: Secondary | ICD-10-CM | POA: Diagnosis not present

## 2022-03-16 DIAGNOSIS — R296 Repeated falls: Secondary | ICD-10-CM

## 2022-03-16 DIAGNOSIS — F03B4 Unspecified dementia, moderate, with anxiety: Secondary | ICD-10-CM

## 2022-03-16 DIAGNOSIS — E039 Hypothyroidism, unspecified: Secondary | ICD-10-CM | POA: Diagnosis not present

## 2022-03-16 DIAGNOSIS — I5032 Chronic diastolic (congestive) heart failure: Secondary | ICD-10-CM | POA: Diagnosis not present
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
## 2022-03-21 DIAGNOSIS — R0602 Shortness of breath: Secondary | ICD-10-CM | POA: Diagnosis not present

## 2022-03-21 DIAGNOSIS — R109 Unspecified abdominal pain: Secondary | ICD-10-CM | POA: Diagnosis not present

## 2022-03-22 DIAGNOSIS — D649 Anemia, unspecified: Secondary | ICD-10-CM | POA: Diagnosis not present

## 2022-03-22 DIAGNOSIS — Z7689 Persons encountering health services in other specified circumstances: Secondary | ICD-10-CM | POA: Diagnosis not present

## 2022-03-22 DIAGNOSIS — N182 Chronic kidney disease, stage 2 (mild): Secondary | ICD-10-CM | POA: Diagnosis not present

## 2022-03-22 DIAGNOSIS — K5900 Constipation, unspecified: Secondary | ICD-10-CM | POA: Diagnosis not present

## 2022-03-22 DIAGNOSIS — F419 Anxiety disorder, unspecified: Secondary | ICD-10-CM | POA: Diagnosis not present

## 2022-03-23 ENCOUNTER — Emergency Department (HOSPITAL_COMMUNITY): Payer: Medicare Other

## 2022-03-23 ENCOUNTER — Encounter (HOSPITAL_COMMUNITY): Payer: Self-pay

## 2022-03-23 ENCOUNTER — Inpatient Hospital Stay (HOSPITAL_COMMUNITY)
Admission: EM | Admit: 2022-03-23 | Discharge: 2022-03-25 | DRG: 312 | Disposition: A | Discharge status: Skilled Nursing Facility | Payer: Medicare Other | Source: Skilled Nursing Facility | Attending: Internal Medicine | Admitting: Internal Medicine

## 2022-03-23 ENCOUNTER — Ambulatory Visit: Payer: Medicare Other | Admitting: Family

## 2022-03-23 DIAGNOSIS — Z96642 Presence of left artificial hip joint: Secondary | ICD-10-CM | POA: Diagnosis present

## 2022-03-23 DIAGNOSIS — N1832 Chronic kidney disease, stage 3b: Secondary | ICD-10-CM | POA: Diagnosis not present

## 2022-03-23 DIAGNOSIS — Z79890 Hormone replacement therapy (postmenopausal): Secondary | ICD-10-CM

## 2022-03-23 DIAGNOSIS — Z96652 Presence of left artificial knee joint: Secondary | ICD-10-CM | POA: Diagnosis present

## 2022-03-23 DIAGNOSIS — R109 Unspecified abdominal pain: Secondary | ICD-10-CM | POA: Diagnosis not present

## 2022-03-23 DIAGNOSIS — E876 Hypokalemia: Secondary | ICD-10-CM | POA: Diagnosis not present

## 2022-03-23 DIAGNOSIS — J449 Chronic obstructive pulmonary disease, unspecified: Secondary | ICD-10-CM | POA: Diagnosis present

## 2022-03-23 DIAGNOSIS — Z85048 Personal history of other malignant neoplasm of rectum, rectosigmoid junction, and anus: Secondary | ICD-10-CM

## 2022-03-23 DIAGNOSIS — N390 Urinary tract infection, site not specified: Secondary | ICD-10-CM | POA: Diagnosis present

## 2022-03-23 DIAGNOSIS — F0390 Unspecified dementia without behavioral disturbance: Secondary | ICD-10-CM | POA: Diagnosis present

## 2022-03-23 DIAGNOSIS — I480 Paroxysmal atrial fibrillation: Secondary | ICD-10-CM | POA: Diagnosis present

## 2022-03-23 DIAGNOSIS — Z87440 Personal history of urinary (tract) infections: Secondary | ICD-10-CM

## 2022-03-23 DIAGNOSIS — K5900 Constipation, unspecified: Secondary | ICD-10-CM | POA: Diagnosis present

## 2022-03-23 DIAGNOSIS — E8809 Other disorders of plasma-protein metabolism, not elsewhere classified: Secondary | ICD-10-CM | POA: Diagnosis present

## 2022-03-23 DIAGNOSIS — Z79899 Other long term (current) drug therapy: Secondary | ICD-10-CM

## 2022-03-23 DIAGNOSIS — K2100 Gastro-esophageal reflux disease with esophagitis, without bleeding: Secondary | ICD-10-CM | POA: Diagnosis not present

## 2022-03-23 DIAGNOSIS — K8020 Calculus of gallbladder without cholecystitis without obstruction: Secondary | ICD-10-CM

## 2022-03-23 DIAGNOSIS — M8580 Other specified disorders of bone density and structure, unspecified site: Secondary | ICD-10-CM | POA: Diagnosis present

## 2022-03-23 DIAGNOSIS — Z66 Do not resuscitate: Secondary | ICD-10-CM | POA: Diagnosis present

## 2022-03-23 DIAGNOSIS — I5032 Chronic diastolic (congestive) heart failure: Secondary | ICD-10-CM | POA: Diagnosis not present

## 2022-03-23 DIAGNOSIS — Z853 Personal history of malignant neoplasm of breast: Secondary | ICD-10-CM

## 2022-03-23 DIAGNOSIS — N170 Acute kidney failure with tubular necrosis: Secondary | ICD-10-CM | POA: Diagnosis not present

## 2022-03-23 DIAGNOSIS — E785 Hyperlipidemia, unspecified: Secondary | ICD-10-CM | POA: Diagnosis present

## 2022-03-23 DIAGNOSIS — Z8261 Family history of arthritis: Secondary | ICD-10-CM

## 2022-03-23 DIAGNOSIS — Z8673 Personal history of transient ischemic attack (TIA), and cerebral infarction without residual deficits: Secondary | ICD-10-CM

## 2022-03-23 DIAGNOSIS — K589 Irritable bowel syndrome without diarrhea: Secondary | ICD-10-CM | POA: Diagnosis present

## 2022-03-23 DIAGNOSIS — K5909 Other constipation: Secondary | ICD-10-CM | POA: Diagnosis present

## 2022-03-23 DIAGNOSIS — N179 Acute kidney failure, unspecified: Secondary | ICD-10-CM | POA: Diagnosis present

## 2022-03-23 DIAGNOSIS — Z90710 Acquired absence of both cervix and uterus: Secondary | ICD-10-CM

## 2022-03-23 DIAGNOSIS — K5730 Diverticulosis of large intestine without perforation or abscess without bleeding: Secondary | ICD-10-CM | POA: Diagnosis not present

## 2022-03-23 DIAGNOSIS — I959 Hypotension, unspecified: Secondary | ICD-10-CM | POA: Diagnosis not present

## 2022-03-23 DIAGNOSIS — Z743 Need for continuous supervision: Secondary | ICD-10-CM | POA: Diagnosis not present

## 2022-03-23 DIAGNOSIS — F419 Anxiety disorder, unspecified: Secondary | ICD-10-CM | POA: Diagnosis present

## 2022-03-23 DIAGNOSIS — E039 Hypothyroidism, unspecified: Secondary | ICD-10-CM | POA: Diagnosis present

## 2022-03-23 DIAGNOSIS — Z7902 Long term (current) use of antithrombotics/antiplatelets: Secondary | ICD-10-CM

## 2022-03-23 DIAGNOSIS — I130 Hypertensive heart and chronic kidney disease with heart failure and stage 1 through stage 4 chronic kidney disease, or unspecified chronic kidney disease: Secondary | ICD-10-CM | POA: Diagnosis present

## 2022-03-23 DIAGNOSIS — R1013 Epigastric pain: Secondary | ICD-10-CM

## 2022-03-23 DIAGNOSIS — R001 Bradycardia, unspecified: Secondary | ICD-10-CM | POA: Diagnosis present

## 2022-03-23 DIAGNOSIS — R413 Other amnesia: Secondary | ICD-10-CM | POA: Diagnosis present

## 2022-03-23 DIAGNOSIS — R079 Chest pain, unspecified: Secondary | ICD-10-CM | POA: Diagnosis not present

## 2022-03-23 DIAGNOSIS — D649 Anemia, unspecified: Secondary | ICD-10-CM | POA: Diagnosis not present

## 2022-03-23 DIAGNOSIS — N281 Cyst of kidney, acquired: Secondary | ICD-10-CM | POA: Diagnosis not present

## 2022-03-23 DIAGNOSIS — Z9049 Acquired absence of other specified parts of digestive tract: Secondary | ICD-10-CM

## 2022-03-23 DIAGNOSIS — I499 Cardiac arrhythmia, unspecified: Secondary | ICD-10-CM | POA: Diagnosis not present

## 2022-03-23 DIAGNOSIS — Z888 Allergy status to other drugs, medicaments and biological substances status: Secondary | ICD-10-CM

## 2022-03-23 DIAGNOSIS — R55 Syncope and collapse: Secondary | ICD-10-CM | POA: Diagnosis not present

## 2022-03-23 DIAGNOSIS — Z9010 Acquired absence of unspecified breast and nipple: Secondary | ICD-10-CM

## 2022-03-23 DIAGNOSIS — K219 Gastro-esophageal reflux disease without esophagitis: Secondary | ICD-10-CM | POA: Diagnosis present

## 2022-03-23 DIAGNOSIS — R296 Repeated falls: Secondary | ICD-10-CM | POA: Diagnosis not present

## 2022-03-23 DIAGNOSIS — Z8249 Family history of ischemic heart disease and other diseases of the circulatory system: Secondary | ICD-10-CM

## 2022-03-23 DIAGNOSIS — E538 Deficiency of other specified B group vitamins: Secondary | ICD-10-CM | POA: Diagnosis present

## 2022-03-23 DIAGNOSIS — R6889 Other general symptoms and signs: Secondary | ICD-10-CM | POA: Diagnosis not present

## 2022-03-23 DIAGNOSIS — I10 Essential (primary) hypertension: Secondary | ICD-10-CM | POA: Diagnosis present

## 2022-03-23 DIAGNOSIS — N3289 Other specified disorders of bladder: Secondary | ICD-10-CM | POA: Diagnosis not present

## 2022-03-23 DIAGNOSIS — Z885 Allergy status to narcotic agent status: Secondary | ICD-10-CM

## 2022-03-23 LAB — CBC
HCT: 27.4 % — ABNORMAL LOW (ref 36.0–46.0)
Hemoglobin: 8.3 g/dL — ABNORMAL LOW (ref 12.0–15.0)
MCH: 26.8 pg (ref 26.0–34.0)
MCHC: 30.3 g/dL (ref 30.0–36.0)
MCV: 88.4 fL (ref 80.0–100.0)
Platelets: 185 10*3/uL (ref 150–400)
RBC: 3.1 MIL/uL — ABNORMAL LOW (ref 3.87–5.11)
RDW: 18.4 % — ABNORMAL HIGH (ref 11.5–15.5)
WBC: 8 10*3/uL (ref 4.0–10.5)
nRBC: 0 % (ref 0.0–0.2)

## 2022-03-23 LAB — COMPREHENSIVE METABOLIC PANEL
ALT: 20 U/L (ref 0–44)
AST: 21 U/L (ref 15–41)
Albumin: 2.1 g/dL — ABNORMAL LOW (ref 3.5–5.0)
Alkaline Phosphatase: 30 U/L — ABNORMAL LOW (ref 38–126)
Anion gap: 3 — ABNORMAL LOW (ref 5–15)
BUN: 10 mg/dL (ref 8–23)
CO2: 19 mmol/L — ABNORMAL LOW (ref 22–32)
Calcium: 6.7 mg/dL — ABNORMAL LOW (ref 8.9–10.3)
Chloride: 115 mmol/L — ABNORMAL HIGH (ref 98–111)
Creatinine, Ser: 0.6 mg/dL (ref 0.44–1.00)
GFR, Estimated: >60 mL/min (ref >60)
Glucose, Bld: 78 mg/dL (ref 70–99)
Potassium: 3.3 mmol/L — ABNORMAL LOW (ref 3.5–5.1)
Sodium: 137 mmol/L (ref 135–145)
Total Bilirubin: 0.4 mg/dL (ref 0.3–1.2)
Total Protein: 4.3 g/dL — ABNORMAL LOW (ref 6.5–8.1)

## 2022-03-23 LAB — URINALYSIS, ROUTINE W REFLEX MICROSCOPIC
Bilirubin Urine: NEGATIVE
Glucose, UA: NEGATIVE mg/dL
Hgb urine dipstick: NEGATIVE
Ketones, ur: NEGATIVE mg/dL
Nitrite: NEGATIVE
Protein, ur: NEGATIVE mg/dL
Specific Gravity, Urine: 1.019 (ref 1.005–1.030)
WBC, UA: >50 WBC/hpf — ABNORMAL HIGH (ref 0–5)
pH: 6 (ref 5.0–8.0)

## 2022-03-23 LAB — HEMOGLOBIN AND HEMATOCRIT, BLOOD
HCT: 35.6 % — ABNORMAL LOW (ref 36.0–46.0)
Hemoglobin: 11.3 g/dL — ABNORMAL LOW (ref 12.0–15.0)

## 2022-03-23 LAB — TROPONIN I (HIGH SENSITIVITY)
Troponin I (High Sensitivity): 11 ng/L (ref <18)
Troponin I (High Sensitivity): 15 ng/L (ref <18)

## 2022-03-23 LAB — MAGNESIUM: Magnesium: 2.2 mg/dL (ref 1.7–2.4)

## 2022-03-23 LAB — POC OCCULT BLOOD, ED: Fecal Occult Bld: NEGATIVE

## 2022-03-23 MED ORDER — ONDANSETRON HCL 4 MG PO TABS: 4.0000 mg | ORAL_TABLET | Freq: Four times a day (QID) | ORAL | Status: DC | PRN

## 2022-03-23 MED ORDER — QUETIAPINE FUMARATE 50 MG PO TABS
25.0000 mg | ORAL_TABLET | Freq: Every day | ORAL | Status: DC
  Administered 2022-03-23 – 2022-03-24 (×2): 25 mg via ORAL
  Filled 2022-03-23 (×2): qty 1

## 2022-03-23 MED ORDER — POTASSIUM CHLORIDE CRYS ER 20 MEQ PO TBCR
40.0000 meq | EXTENDED_RELEASE_TABLET | Freq: Once | ORAL | Status: AC
  Administered 2022-03-23: 40 meq via ORAL
  Filled 2022-03-23: qty 2

## 2022-03-23 MED ORDER — PANTOPRAZOLE SODIUM 40 MG PO TBEC: 40.0000 mg | DELAYED_RELEASE_TABLET | Freq: Two times a day (BID) | ORAL | Status: DC

## 2022-03-23 MED ORDER — POLYVINYL ALCOHOL 1.4 % OP SOLN
1.0000 [drp] | Freq: Every day | OPHTHALMIC | Status: DC
  Administered 2022-03-24 (×2): 1 [drp] via OPHTHALMIC
  Filled 2022-03-23: qty 15

## 2022-03-23 MED ORDER — METOPROLOL TARTRATE 5 MG/5ML IV SOLN
2.5000 mg | Freq: Four times a day (QID) | INTRAVENOUS | Status: DC | PRN
  Administered 2022-03-23: 2.5 mg via INTRAVENOUS
  Filled 2022-03-23: qty 5

## 2022-03-23 MED ORDER — ONDANSETRON HCL 4 MG/2ML IJ SOLN: 4.0000 mg | Freq: Four times a day (QID) | INTRAMUSCULAR | Status: DC | PRN

## 2022-03-23 MED ORDER — ACETAMINOPHEN 650 MG RE SUPP: 650.0000 mg | Freq: Four times a day (QID) | RECTAL | Status: DC | PRN

## 2022-03-23 MED ORDER — AMLODIPINE BESYLATE 5 MG PO TABS
5.0000 mg | ORAL_TABLET | Freq: Every day | ORAL | Status: DC
  Administered 2022-03-24 – 2022-03-25 (×2): 5 mg via ORAL
  Filled 2022-03-23 (×2): qty 1

## 2022-03-23 MED ORDER — POLYETHYLENE GLYCOL 3350 17 G PO PACK
17.0000 g | PACK | Freq: Every day | ORAL | Status: DC
  Administered 2022-03-23: 17 g via ORAL
  Filled 2022-03-23: qty 1

## 2022-03-23 MED ORDER — BISACODYL 10 MG RE SUPP: 10.0000 mg | Freq: Every day | RECTAL | Status: DC | PRN

## 2022-03-23 MED ORDER — DICLOFENAC SODIUM 1 % EX GEL
2.0000 g | Freq: Two times a day (BID) | CUTANEOUS | Status: DC | PRN
  Filled 2022-03-23: qty 100

## 2022-03-23 MED ORDER — HYDROMORPHONE HCL 1 MG/ML IJ SOLN
0.5000 mg | INTRAMUSCULAR | Status: DC | PRN
  Filled 2022-03-23: qty 1

## 2022-03-23 MED ORDER — ACETAMINOPHEN 325 MG PO TABS
650.0000 mg | ORAL_TABLET | Freq: Four times a day (QID) | ORAL | Status: DC | PRN
  Administered 2022-03-25: 650 mg via ORAL
  Filled 2022-03-23: qty 2

## 2022-03-23 MED ORDER — SODIUM CHLORIDE 0.9 % IV SOLN
2.0000 g | Freq: Two times a day (BID) | INTRAVENOUS | Status: DC
  Administered 2022-03-23 – 2022-03-25 (×4): 2 g via INTRAVENOUS
  Filled 2022-03-23 (×4): qty 12.5

## 2022-03-23 MED ORDER — MELATONIN 3 MG PO TABS
3.0000 mg | ORAL_TABLET | Freq: Every evening | ORAL | Status: DC | PRN
  Administered 2022-03-23 – 2022-03-24 (×2): 3 mg via ORAL
  Filled 2022-03-23 (×3): qty 1

## 2022-03-23 MED ORDER — ATORVASTATIN CALCIUM 40 MG PO TABS
40.0000 mg | ORAL_TABLET | Freq: Every evening | ORAL | Status: DC
  Administered 2022-03-23 – 2022-03-24 (×2): 40 mg via ORAL
  Filled 2022-03-23 (×2): qty 1

## 2022-03-23 MED ORDER — LOSARTAN POTASSIUM 25 MG PO TABS
25.0000 mg | ORAL_TABLET | Freq: Every day | ORAL | Status: DC
  Administered 2022-03-24 – 2022-03-25 (×2): 25 mg via ORAL
  Filled 2022-03-23 (×2): qty 1

## 2022-03-23 MED ORDER — CLOPIDOGREL BISULFATE 75 MG PO TABS
75.0000 mg | ORAL_TABLET | Freq: Every day | ORAL | Status: DC
  Administered 2022-03-23 – 2022-03-25 (×3): 75 mg via ORAL
  Filled 2022-03-23 (×3): qty 1

## 2022-03-23 MED ORDER — SODIUM CHLORIDE 0.9 % IV SOLN: INTRAVENOUS | Status: AC

## 2022-03-23 MED ORDER — IPRATROPIUM-ALBUTEROL 0.5-2.5 (3) MG/3ML IN SOLN: 3.0000 mL | RESPIRATORY_TRACT | Status: DC | PRN

## 2022-03-23 MED ORDER — LEVOTHYROXINE SODIUM 75 MCG PO TABS
75.0000 ug | ORAL_TABLET | Freq: Every day | ORAL | Status: DC
  Administered 2022-03-24 – 2022-03-25 (×2): 75 ug via ORAL
  Filled 2022-03-23 (×2): qty 1

## 2022-03-23 MED ORDER — ENOXAPARIN SODIUM 30 MG/0.3ML IJ SOSY: 30.0000 mg | PREFILLED_SYRINGE | INTRAMUSCULAR | Status: DC

## 2022-03-23 MED ORDER — AMIODARONE HCL 200 MG PO TABS
200.0000 mg | ORAL_TABLET | Freq: Every day | ORAL | Status: DC
  Administered 2022-03-24 – 2022-03-25 (×2): 200 mg via ORAL
  Filled 2022-03-23 (×3): qty 1

## 2022-03-23 MED ORDER — ACETAMINOPHEN 500 MG PO TABS: 1000.0000 mg | ORAL_TABLET | Freq: Four times a day (QID) | ORAL | Status: DC | PRN

## 2022-03-23 MED ORDER — POLYETHYLENE GLYCOL 3350 17 G PO PACK
17.0000 g | PACK | Freq: Two times a day (BID) | ORAL | Status: AC
  Administered 2022-03-23 – 2022-03-24 (×3): 17 g via ORAL
  Filled 2022-03-23 (×3): qty 1

## 2022-03-23 MED ORDER — ISOSORBIDE MONONITRATE ER 30 MG PO TB24
30.0000 mg | ORAL_TABLET | Freq: Every morning | ORAL | Status: DC
  Administered 2022-03-24 – 2022-03-25 (×2): 30 mg via ORAL
  Filled 2022-03-23 (×2): qty 1

## 2022-03-23 MED ORDER — ISOSORBIDE MONONITRATE ER 30 MG PO TB24: 30.0000 mg | ORAL_TABLET | Freq: Every morning | ORAL | Status: DC

## 2022-03-23 MED ORDER — IOHEXOL 350 MG/ML SOLN
70.0000 mL | Freq: Once | INTRAVENOUS | Status: AC | PRN
  Administered 2022-03-23: 70 mL via INTRAVENOUS

## 2022-03-23 MED ORDER — SODIUM CHLORIDE 0.9 % IV SOLN
1.0000 g | Freq: Once | INTRAVENOUS | Status: DC
  Administered 2022-03-23: 1 g via INTRAVENOUS
  Filled 2022-03-23: qty 10

## 2022-03-23 MED ORDER — POLYETHYLENE GLYCOL 3350 17 G PO PACK
17.0000 g | PACK | Freq: Every day | ORAL | Status: DC
  Administered 2022-03-25: 17 g via ORAL
  Filled 2022-03-23: qty 1

## 2022-03-23 MED ORDER — POLYETHYL GLYCOL-PROPYL GLYCOL 0.4-0.3 % OP GEL: 1.0000 | Freq: Every day | OPHTHALMIC | Status: DC

## 2022-03-23 MED ORDER — CALCIUM GLUCONATE-NACL 1-0.675 GM/50ML-% IV SOLN
1.0000 g | Freq: Once | INTRAVENOUS | Status: AC
  Administered 2022-03-23: 1000 mg via INTRAVENOUS
  Filled 2022-03-23: qty 50

## 2022-03-23 MED ORDER — PANTOPRAZOLE SODIUM 40 MG PO TBEC
40.0000 mg | DELAYED_RELEASE_TABLET | Freq: Two times a day (BID) | ORAL | Status: DC
  Administered 2022-03-23 – 2022-03-25 (×4): 40 mg via ORAL
  Filled 2022-03-23 (×4): qty 1

## 2022-03-23 NOTE — ED Notes (Signed)
Pt given soap suds enema, pt unable to hold enema, pt passed several small hard balls of stool when placing enema.

## 2022-03-23 NOTE — ED Notes (Signed)
Pt had large soft BM, pt cleaned prior to being transport to floor

## 2022-03-23 NOTE — ED Notes (Signed)
Pt bp is 844 systolic. MD notified. Cardiac monitoring in place.

## 2022-03-23 NOTE — Progress Notes (Signed)
Pharmacy Antibiotic Note  [name] is a 88 y.o. female for which pharmacy has been consulted for cefepime dosing for UTI.  SCr 0.6 WBC 8; HR 69; RR 20>25; afeb  Plan: Cefepime 2g q12hr Trend WBC, Fever, Renal function F/u cultures, clinical course, WBC, fever De-escalate when able  Height: '5\' 7"'$  (170.2 cm) Weight: 70 kg (154 lb 5.2 oz) IBW/kg (Calculated) : 61.6  Temp (24hrs), Avg:98.3 F (36.8 C), Min:98.1 F (36.7 C), Max:98.5 F (36.9 C)  Recent Labs  Lab 03/23/22 1218  WBC 8.0  CREATININE 0.60    Estimated Creatinine Clearance: 41.8 mL/min (by C-G formula based on SCr of 0.6 mg/dL).    Allergies  Allergen Reactions   Codeine Itching, Anxiety and Other (See Comments)    Caused the patient to feel nervous/confused.   Pregabalin Anxiety and Other (See Comments)    Caused nervousness    Antimicrobials this admission: rocephin 9/20 x 1 cefepime 9/20 >>  Microbiology results: Pending  Thank you for allowing pharmacy to be a part of this patient's care.  Lorelei Pont, PharmD, BCPS 03/23/2022 5:48 PM ED Clinical Pharmacist -  [phone]

## 2022-03-23 NOTE — ED Provider Notes (Signed)
Seen after prior EDP.  Patient is comfortable.  Patient understands plan for admission.  Patient's daughter Dorothe Pea made aware of plan of care.  She is in agreement with plan for admission.  Hospitalist service is aware of case and will evaluate for admission.     Valarie Merino, MD 03/23/22 [phone]

## 2022-03-23 NOTE — ED Triage Notes (Signed)
Pt brought in by EMS from [facility] for bradycardia in the 30s and hypotension with initial bp of 90s palpated per EMS. Alert and oriented x 3 and at baseline. Reports abdominal pain for a few days. No nausea, vomiting or diarrhea.
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
## 2022-03-23 NOTE — ED Provider Notes (Signed)
[facility] EMERGENCY DEPARTMENT Provider Note   CSN: [id number] Arrival date & time: 03/23/22  1138     History {Add pertinent medical, surgical, social history, OB history to HPI:1} Chief Complaint  Patient presents with   Hypotension   Bradycardia    [name] is a 88 y.o. female.  Patient presents from [facility] facility for bradycardia in the 30s, hypotension with initial blood pressure in the 90s.  Patient is alert and oriented x3 and at baseline neurologically.  Patient has memory difficulties.  Patient reports upper abdominal pain intermittent for few days no nausea vomiting or diarrhea.  No fevers.  No chest pain however upper stomach is where she points.  No reported blood in the stools.  Patient was recently discharged on the seventh of this month.       Home Medications Prior to Admission medications   Medication Sig Start Date End Date Taking? Authorizing Provider  acetaminophen (TYLENOL) 500 MG tablet Take 1,000 mg by mouth every 6 (six) hours as needed for headache (pain).    [provider]  amiodarone (PACERONE) 200 MG tablet Take 200 mg by mouth daily at 12 noon. 12/11/21   [provider]  amLODipine (NORVASC) 5 MG tablet Take 5 mg by mouth daily. 02/07/22   [provider]  atorvastatin (LIPITOR) 40 MG tablet Take 1 tablet (40 mg total) by mouth daily at 6 PM. Patient taking differently: Take 40 mg by mouth every evening. 09/02/19   Dixie Dials, MD  clopidogrel (PLAVIX) 75 MG tablet Take 1 tablet (75 mg total) by mouth daily. Patient taking differently: Take 75 mg by mouth daily at 12 noon. 09/02/19   Dixie Dials, MD  cyanocobalamin 1000 MCG tablet Take 1 tablet (1,000 mcg total) by mouth daily. 03/11/22   Raiford Noble Latif, DO  diclofenac sodium (VOLTAREN) 1 % GEL APPLY 2-4 GRAMS TO AFFECTED AREA 2-3 TIMES A DAY AS NEEDED. Patient taking differently: Apply 1 Application topically 2 (two) times daily as  needed (joint pain). 04/30/19   Meredith Pel, MD  esomeprazole (NEXIUM) 40 MG capsule Take 40 mg by mouth every morning.    [provider]  furosemide (LASIX) 40 MG tablet Take 0.5 tablets (20 mg total) by mouth daily as needed for fluid or edema. 03/10/22   Raiford Noble Latif, DO  isosorbide mononitrate (IMDUR) 30 MG 24 hr tablet Take 30 mg by mouth every morning. 02/07/22   [provider]  levothyroxine (SYNTHROID) 75 MCG tablet Take 1 tablet (75 mcg total) by mouth daily. Patient taking differently: Take 75 mcg by mouth every morning. 01/03/22   Pokhrel, Corrie Mckusick, MD  losartan (COZAAR) 25 MG tablet Take 25 mg by mouth daily. 02/07/22   [provider]  Melatonin 3 MG TABS Take 3 mg by mouth at bedtime as needed (sleep).     [provider]  metoprolol tartrate (LOPRESSOR) 25 MG tablet Take 25 mg by mouth See admin instructions. Take one tablet (25 mg) by mouth twice daily - noon and evening 12/11/21   [provider]  Multiple Vitamins-Minerals (MULTIVITAMIN ADULTS 50+) TABS Take 1 tablet by mouth daily.    [provider]  nitroGLYCERIN (NITROSTAT) 0.4 MG SL tablet Place 1 tablet (0.4 mg total) under the tongue every 5 (five) minutes x 3 doses as needed for chest pain. 09/02/19   Dixie Dials, MD  Polyethyl Glycol-Propyl Glycol (SYSTANE) 0.4-0.3 % GEL ophthalmic gel Place 1 application into both  eyes at bedtime.    [provider]  potassium chloride SA (KLOR-CON M20) 20 MEQ tablet Take 1 tablet (20 mEq total) by mouth every Monday, Wednesday, and Friday. Patient taking differently: Take 20 mEq by mouth daily at 12 noon. 12/03/21   Dixie Dials, MD  QUEtiapine (SEROQUEL) 25 MG tablet Take 25 mg by mouth at bedtime. 03/01/22   [provider]      Allergies    Codeine and Pregabalin    Review of Systems   Review of Systems  Constitutional:  Positive for fatigue. Negative for chills and fever.  HENT:  Negative for  congestion.   Eyes:  Negative for visual disturbance.  Respiratory:  Negative for shortness of breath.   Cardiovascular:  Negative for chest pain.  Gastrointestinal:  Positive for abdominal pain. Negative for vomiting.  Genitourinary:  Negative for dysuria and flank pain.  Musculoskeletal:  Negative for back pain, neck pain and neck stiffness.  Skin:  Negative for rash.  Neurological:  Negative for light-headedness and headaches.    Physical Exam Updated Vital Signs BP (!) 162/60 (BP Location: Right Arm)   Pulse (!) 56   Temp 98.1 F (36.7 C) (Oral)   Resp 18   Ht '5\' 7"'$  (1.702 m)   Wt 70 kg   SpO2 100%   BMI 24.17 kg/m  Physical Exam Vitals and nursing note reviewed.  Constitutional:      General: She is not in acute distress.    Appearance: She is well-developed.  HENT:     Head: Normocephalic and atraumatic.     Mouth/Throat:     Mouth: Mucous membranes are dry.  Eyes:     General:        Right eye: No discharge.        Left eye: No discharge.     Conjunctiva/sclera: Conjunctivae normal.  Neck:     Trachea: No tracheal deviation.  Cardiovascular:     Rate and Rhythm: Regular rhythm. Bradycardia present.  Pulmonary:     Effort: Pulmonary effort is normal.     Breath sounds: Normal breath sounds.  Abdominal:     General: There is no distension.     Palpations: Abdomen is soft.     Tenderness: There is abdominal tenderness (epig). There is no guarding.  Musculoskeletal:     Cervical back: Normal range of motion and neck supple. No rigidity.  Skin:    General: Skin is warm.     Capillary Refill: Capillary refill takes less than 2 seconds.     Findings: No rash.  Neurological:     General: No focal deficit present.     Mental Status: She is alert.     Cranial Nerves: No cranial nerve deficit.     Comments: Difficulty with hearing need to speak loud, mild memory impairment.,  Pleasant, equal strength upper and lower extremities bilateral.,  General mild weakness  and deconditioning overall.  Psychiatric:        Mood and Affect: Mood normal.     ED Results / Procedures / Treatments   Labs (all labs ordered are listed, but only abnormal results are displayed) Labs Reviewed  CBC - Abnormal; Notable for the following components:      Result Value   RBC 3.10 (*)    Hemoglobin 8.3 (*)    HCT 27.4 (*)    RDW 18.4 (*)    All other components within normal limits  COMPREHENSIVE METABOLIC PANEL  MAGNESIUM  POC  OCCULT BLOOD, ED  TROPONIN I (HIGH SENSITIVITY)    EKG EKG Interpretation  Date/Time:  Wednesday March 23 2022 12:05:14 EDT Ventricular Rate:  54 PR Interval:  254 QRS Duration: 162 QT Interval:  478 QTC Calculation: 453 R Axis:   -44 Text Interpretation: Sinus rhythm Prolonged PR interval Left bundle branch block similar previous block Confirmed by Elnora Morrison [phone]) on 03/23/2022 12:10:59 PM  Radiology No results found.  Procedures Procedures  {Document cardiac monitor, telemetry assessment procedure when appropriate:1}  Medications Ordered in ED Medications - No data to display  ED Course/ Medical Decision Making/ A&P                           Medical Decision Making Amount and/or Complexity of Data Reviewed Labs: ordered. Radiology: ordered.   Patient presents with abdominal pain, bradycardia and borderline blood pressure.  Patient has extensive medical history and was recently discharged from frequent falls, urine infection and acute renal failure on the seventh of the month.  Medical records reviewed at bedside from nursing home including her medications and diagnoses.  Reviewed hospital medical records as well.  Differential broad including anemia, electro abnormalities, cardiac given epigastric pain, abdominal pathology such as gastric related/gallbladder/aorta, urine infection, other.  Blood pressure elevated in the ER, mild bradycardia, EKG reviewed similar to previous.  Patient's hemoglobin did return 8.3  lower than previous from 2 weeks prior plan for Hemoccult and monitoring.  CT abdomen pelvis pending.  {Document critical care time when appropriate:1} {Document review of labs and clinical decision tools ie heart score, Chads2Vasc2 etc:1}  {Document your independent review of radiology images, and any outside records:1} {Document your discussion with family members, caretakers, and with consultants:1} {Document social determinants of health affecting pt's care:1} {Document your decision making why or why not admission, treatments were needed:1} Final Clinical Impression(s) / ED Diagnoses Final diagnoses:  Symptomatic anemia  Sinus bradycardia  Abdominal pain, epigastric    Rx / DC Orders ED Discharge Orders     None

## 2022-03-24 DIAGNOSIS — R296 Repeated falls: Secondary | ICD-10-CM | POA: Diagnosis present

## 2022-03-24 DIAGNOSIS — K5900 Constipation, unspecified: Secondary | ICD-10-CM

## 2022-03-24 DIAGNOSIS — N390 Urinary tract infection, site not specified: Secondary | ICD-10-CM | POA: Diagnosis present

## 2022-03-24 DIAGNOSIS — N1832 Chronic kidney disease, stage 3b: Secondary | ICD-10-CM

## 2022-03-24 DIAGNOSIS — E876 Hypokalemia: Secondary | ICD-10-CM | POA: Diagnosis present

## 2022-03-24 DIAGNOSIS — R1013 Epigastric pain: Secondary | ICD-10-CM | POA: Diagnosis present

## 2022-03-24 DIAGNOSIS — N170 Acute kidney failure with tubular necrosis: Secondary | ICD-10-CM

## 2022-03-24 DIAGNOSIS — M8580 Other specified disorders of bone density and structure, unspecified site: Secondary | ICD-10-CM | POA: Diagnosis present

## 2022-03-24 DIAGNOSIS — I10 Essential (primary) hypertension: Secondary | ICD-10-CM

## 2022-03-24 DIAGNOSIS — I5032 Chronic diastolic (congestive) heart failure: Secondary | ICD-10-CM

## 2022-03-24 DIAGNOSIS — Z96642 Presence of left artificial hip joint: Secondary | ICD-10-CM | POA: Diagnosis present

## 2022-03-24 DIAGNOSIS — F0390 Unspecified dementia without behavioral disturbance: Secondary | ICD-10-CM | POA: Diagnosis present

## 2022-03-24 DIAGNOSIS — I959 Hypotension, unspecified: Secondary | ICD-10-CM | POA: Diagnosis present

## 2022-03-24 DIAGNOSIS — I480 Paroxysmal atrial fibrillation: Secondary | ICD-10-CM | POA: Diagnosis present

## 2022-03-24 DIAGNOSIS — Z66 Do not resuscitate: Secondary | ICD-10-CM | POA: Diagnosis present

## 2022-03-24 DIAGNOSIS — E785 Hyperlipidemia, unspecified: Secondary | ICD-10-CM | POA: Diagnosis present

## 2022-03-24 DIAGNOSIS — Z96652 Presence of left artificial knee joint: Secondary | ICD-10-CM | POA: Diagnosis present

## 2022-03-24 DIAGNOSIS — K2100 Gastro-esophageal reflux disease with esophagitis, without bleeding: Secondary | ICD-10-CM | POA: Diagnosis not present

## 2022-03-24 DIAGNOSIS — I130 Hypertensive heart and chronic kidney disease with heart failure and stage 1 through stage 4 chronic kidney disease, or unspecified chronic kidney disease: Secondary | ICD-10-CM | POA: Diagnosis present

## 2022-03-24 DIAGNOSIS — R001 Bradycardia, unspecified: Secondary | ICD-10-CM | POA: Diagnosis present

## 2022-03-24 DIAGNOSIS — R55 Syncope and collapse: Secondary | ICD-10-CM | POA: Diagnosis present

## 2022-03-24 DIAGNOSIS — J449 Chronic obstructive pulmonary disease, unspecified: Secondary | ICD-10-CM | POA: Diagnosis present

## 2022-03-24 DIAGNOSIS — F419 Anxiety disorder, unspecified: Secondary | ICD-10-CM | POA: Diagnosis present

## 2022-03-24 DIAGNOSIS — E538 Deficiency of other specified B group vitamins: Secondary | ICD-10-CM | POA: Diagnosis present

## 2022-03-24 DIAGNOSIS — D649 Anemia, unspecified: Secondary | ICD-10-CM | POA: Diagnosis present

## 2022-03-24 DIAGNOSIS — E8809 Other disorders of plasma-protein metabolism, not elsewhere classified: Secondary | ICD-10-CM | POA: Diagnosis present

## 2022-03-24 DIAGNOSIS — K5909 Other constipation: Secondary | ICD-10-CM | POA: Diagnosis present

## 2022-03-24 DIAGNOSIS — E039 Hypothyroidism, unspecified: Secondary | ICD-10-CM | POA: Diagnosis present

## 2022-03-24 LAB — CBC
HCT: 35.6 % — ABNORMAL LOW (ref 36.0–46.0)
Hemoglobin: 11.2 g/dL — ABNORMAL LOW (ref 12.0–15.0)
MCH: 26.7 pg (ref 26.0–34.0)
MCHC: 31.5 g/dL (ref 30.0–36.0)
MCV: 85 fL (ref 80.0–100.0)
Platelets: 244 10*3/uL (ref 150–400)
RBC: 4.19 MIL/uL (ref 3.87–5.11)
RDW: 18.3 % — ABNORMAL HIGH (ref 11.5–15.5)
WBC: 9.3 10*3/uL (ref 4.0–10.5)
nRBC: 0 % (ref 0.0–0.2)

## 2022-03-24 LAB — BASIC METABOLIC PANEL
Anion gap: 6 (ref 5–15)
BUN: 12 mg/dL (ref 8–23)
CO2: 23 mmol/L (ref 22–32)
Calcium: 9.6 mg/dL (ref 8.9–10.3)
Chloride: 105 mmol/L (ref 98–111)
Creatinine, Ser: 1.05 mg/dL — ABNORMAL HIGH (ref 0.44–1.00)
GFR, Estimated: 49 mL/min — ABNORMAL LOW (ref >60)
Glucose, Bld: 110 mg/dL — ABNORMAL HIGH (ref 70–99)
Potassium: 5.1 mmol/L (ref 3.5–5.1)
Sodium: 134 mmol/L — ABNORMAL LOW (ref 135–145)

## 2022-03-24 MED ORDER — HYDRALAZINE HCL 20 MG/ML IJ SOLN
10.0000 mg | Freq: Four times a day (QID) | INTRAMUSCULAR | Status: DC | PRN
  Filled 2022-03-24: qty 1

## 2022-03-24 MED ORDER — SORBITOL 70 % SOLN
960.0000 mL | TOPICAL_OIL | Freq: Once | ORAL | Status: AC
  Administered 2022-03-24: 960 mL via RECTAL
  Filled 2022-03-24: qty 240

## 2022-03-24 MED ORDER — FOLIC ACID 1 MG PO TABS
1.0000 mg | ORAL_TABLET | Freq: Every day | ORAL | Status: DC
  Administered 2022-03-24 – 2022-03-25 (×2): 1 mg via ORAL
  Filled 2022-03-24 (×2): qty 1

## 2022-03-24 NOTE — Progress Notes (Signed)
Mobility Specialist - Progress Note   03/24/22 1555  Mobility  Activity Transferred from bed to chair  Level of Assistance Contact guard assist, steadying assist  Assistive Device Front wheel walker  Distance Ambulated (ft) 5 ft  Activity Response Tolerated well  $Mobility charge 1 Mobility    Pt in bed requesting to sit in recliner. No complaints throughout. Left in recliner w/ call bell in reach and all needs met.   Paulla Dolly Mobility Specialist
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
## 2022-03-24 NOTE — Progress Notes (Signed)
[facility]                                                                              [name], is a 88 y.o. female, DOB - 07-11-1924, [id number] Admit date - 03/23/2022    Outpatient Primary MD for the patient is [name] Carol, MD  LOS - 0  days  Chief Complaint  Patient presents with   Hypotension   Bradycardia       Brief summary   Patient is a 88 year old female with history of  breast CA s/p mastectomy with concerning recurrent breast lesions, chronic HFpEF, GERD, HTN, hypothyroidism, CVA, PAF not on anticoagulation secondary to frequent falls, CKD stage IIIa, recent Pseudomonas UTI, memory loss sent from nursing home for evaluation of bradycardia and near syncope.  Patient had reported abdominal pain, cramping-like for some time, no nausea vomiting or diarrhea.  She has chronic constipation, described pain as epigastric cramping, no fevers or chills, cough or shortness of breath.  On the day of admission, patient was found looking pale and bradycardia with heart rate in 30s, BP dropped to 90s.  Patient did not remember what had happened. Heart rate in 60s in ED, SBP 150s to 170s, chest x-ray showed no pneumonia.  UA positive for UTI. CT abdomen and pelvis showed inflammation of bladder wall, moderate stool burden.   Assessment & Plan    Principal Problem: Near syncope, likely vasovagal versus orthostatic -BP now elevated, will check orthostatics, PT evaluation  Active Problems: Constipation, abdominal pain -No significant acute findings on CT abdomen other than moderate stool burden -Patient had enema in the ED and had BM.  Placed on bowel regimen     Frequent falls with increased weakness -Pending PT evaluation    UTI (urinary tract infection) -Follow urine culture and sensitivities, patient started on IV cefepime -Multiple episodes of UTIs this year with Klebsiella in July, Pseudomonas 2 weeks PTA.  May need chronic suppression antibiotic  treatment outpatient   stage 3b chronic kidney disease ([place]) -Baseline creatinine 1.0-1.1 -Presented with creatinine of 0.6, currently 1.0 at baseline    Paroxysmal atrial fibrillation (HCC) -Heart rate mentioned in 30s, bradycardia at the SNF, currently in 60s -For now continue amiodarone, however metoprolol has been held, continue Norvasc for hypertension -Not on anticoagulation due to falls.    Hypothyroidism Continue Synthroid, check TSH 75 mcg daily -TSH 36.4 on 03/06/2022, free T4 0.6  -Recent admission 9/3- 9/7, patient was noted to have stopped taking Synthroid and TSH was elevated, patient was restarted on Synthroid on home dose. -Will check TSH, fT4 may need to increase dose     Chronic diastolic CHF (congestive heart failure) ([place]) -Euvolemic, patient was placed on gentle hydration -Lasix was held, monitor I's and O's, negative balance of 1.5 L -On Lasix 20 mg daily as needed for fluid or edema  Hypokalemia -Resolved  Normocytic anemia, folic acid deficiency -Hemoglobin 8.3 on admission, no obvious bleeding.  Hemoglobin 11.7 on 9/6, -improved to 11.2 today -Recent iron studies showed low folate 5.3 - place on folic acid 1 mg daily   History of CVA -Continue Plavix  GERD  Continue PPI  History of dementia, memory issues -Continue Seroquel at bedtime  Code Status: Full CODE STATUS DVT Prophylaxis:  Place and maintain sequential compression device Start: 03/23/22 1803   Level of Care: Level of care: Telemetry Medical Family Communication: Updated patient   Disposition Plan:      Remains inpatient appropriate: Pending urine culture and sensitivities, hopefully DC to memory unit tomorrow or Saturday   Procedures:  None  Consultants:   None  Antimicrobials:   Anti-infectives (From admission, onward)    Start     Dose/Rate Route Frequency Ordered Stop   03/23/22 2200  ceFEPIme (MAXIPIME) 2 g in sodium chloride 0.9 % 100 mL IVPB        2 g 200 mL/hr  over 30 Minutes Intravenous Every 12 hours 03/23/22 1748     03/23/22 1645  cefTRIAXone (ROCEPHIN) 1 g in sodium chloride 0.9 % 100 mL IVPB  Status:  Discontinued        1 g 200 mL/hr over 30 Minutes Intravenous  Once 03/23/22 1632 03/23/22 1821          Medications  amiodarone  200 mg Oral Q1200   amLODipine  5 mg Oral Daily   atorvastatin  40 mg Oral QPM   clopidogrel  75 mg Oral Daily   isosorbide mononitrate  30 mg Oral q morning   levothyroxine  75 mcg Oral Q0600   losartan  25 mg Oral Daily   pantoprazole  40 mg Oral BID   polyethylene glycol  17 g Oral BID   [START ON 03/25/2022] polyethylene glycol  17 g Oral Daily   polyvinyl alcohol  1 drop Both Eyes QHS   QUEtiapine  25 mg Oral QHS   sorbitol, milk of mag, mineral oil, glycerin (SMOG) enema  960 mL Rectal Once      Subjective:   [name] was seen and examined today.  No acute complaints, BP readings elevated.  Denies any nausea vomiting or significant abdominal pain, pleasant.  No acute events overnight.  Objective:   Vitals:   03/24/22 0030 03/24/22 0300 03/24/22 0302 03/24/22 0832  BP: (!) 173/65 (!) 169/70 (!) 175/68 (!) 175/84  Pulse: 60 66 69 61  Resp:  '20 20 17  '$ Temp:    98.3 F (36.8 C)  TempSrc:    Oral  SpO2:    99%  Weight:      Height:        Intake/Output Summary (Last 24 hours) at 03/24/2022 1042 Last data filed at 03/24/2022 0600 Gross per 24 hour  Intake 150 ml  Output 1700 ml  Net -1550 ml     Wt Readings from Last 3 Encounters:  03/23/22 70 kg  03/06/22 70 kg  03/04/22 70 kg     Exam General: Alert and oriented x self, NAD, pleasant Cardiovascular: S1 S2 auscultated,  RRR Respiratory: Clear to auscultation bilaterally, no wheezing Gastrointestinal: Soft, nontender, nondistended, + bowel sounds Ext: no pedal edema bilaterally Neuro: Strength 5/5 upper and lower extremities bilaterally Psych: Normal affect and demeanor, alert and oriented x3     Data Reviewed:  I  have personally reviewed following labs    CBC Lab Results  Component Value Date   WBC 9.3 03/24/2022   RBC 4.19 03/24/2022   HGB 11.2 (L) 03/24/2022   HCT 35.6 (L) 03/24/2022   MCV 85.0 03/24/2022   MCH 26.7 03/24/2022   PLT 244 03/24/2022   MCHC 31.5 03/24/2022   RDW 18.3 (H)  03/24/2022   LYMPHSABS 1.6 03/09/2022   MONOABS 1.0 03/09/2022   EOSABS 0.1 03/09/2022   BASOSABS 0.0 79/89/2119     Last metabolic panel Lab Results  Component Value Date   NA 134 (L) 03/24/2022   K 5.1 03/24/2022   CL 105 03/24/2022   CO2 23 03/24/2022   BUN 12 03/24/2022   CREATININE 1.05 (H) 03/24/2022   GLUCOSE 110 (H) 03/24/2022   GFRNONAA 49 (L) 03/24/2022   GFRAA >60 08/31/2019   CALCIUM 9.6 03/24/2022   PHOS 4.3 03/09/2022   PROT 4.3 (L) 03/23/2022   ALBUMIN 2.1 (L) 03/23/2022   BILITOT 0.4 03/23/2022   ALKPHOS 30 (L) 03/23/2022   AST 21 03/23/2022   ALT 20 03/23/2022   ANIONGAP 6 03/24/2022    CBG (last 3)  No results for input(s): "GLUCAP" in the last 72 hours.    Coagulation Profile: No results for input(s): "INR", "PROTIME" in the last 168 hours.   Radiology Studies: I have personally reviewed the imaging studies  CT ABDOMEN PELVIS W CONTRAST  Result Date: 03/23/2022 CLINICAL DATA:  Abdominal pain, acute, nonlocalized upper abd pain  IMPRESSION: Moderate bladder distension with circumferential wall thickening, can be seen with cystitis, correlate with urinalysis. Significant stool burden in the colon suggesting constipation. Cardiomegaly with findings of mild pulmonary edema in the lung bases. Moderate-sized hiatal hernia.  Cholelithiasis. Sigmoid diverticulosis. Electronically Signed   By: Maurine Simmering M.D.   On: 03/23/2022 14:03   DG Chest 2 View  Result Date: 03/23/2022 CLINICAL DATA:  Hypotension with bradycardia. IMPRESSION: Stable cardiomegaly and chronic pulmonary subpleural reticulation. No acute cardiopulmonary process. Electronically Signed   By: Richardean Sale  M.D.   On: 03/23/2022 13:31       Betta Balla M.D. [facility] 03/24/2022, 10:42 AM  Available via Epic secure chat 7am-7pm After 7 pm, please refer to night coverage provider listed on amion.

## 2022-03-24 NOTE — Progress Notes (Signed)
Mobility Specialist - Progress Note   03/24/22 1616  Mobility  Activity Transferred from chair to bed  Level of Assistance Contact guard assist, steadying assist  Assistive Device Front wheel walker  Distance Ambulated (ft) 0 ft  Activity Response Tolerated well  $Mobility charge 1 Mobility    Pt received in chair requesting to transfer to bed. No complaints throughout. Left in bed w/ call bell in reach and all needs met.   Paulla Dolly Mobility Specialist

## 2022-03-24 NOTE — Progress Notes (Signed)
Pt able to tolerate about 569m of smog enema.

## 2022-03-24 NOTE — TOC Progression Note (Addendum)
Transition of Care [facility]) - Initial/Assessment Note    Patient Details  Name: [name] MRN: [id number] Date of Birth: 05-14-1925  [facility] Care [facility]) CM/SW Contact:    Milinda Antis, [place] Phone Number: 03/24/2022, 2:13 PM  Clinical Narrative:                 CSW contacted [facility] to inquire about whether the patient is LT or ST at the facility and is awaiting a response.    15:00- CSW received a response from [facility].  The patient can return when medically ready.  All that would be needed is the discharge summary.  TOC will continue to follow.          Patient Goals and CMS Choice        Expected Discharge Plan and Services                                                Prior Living Arrangements/Services                       Activities of Daily Living      Permission Sought/Granted                  Emotional Assessment              Admission diagnosis:  Abdominal pain, epigastric [R10.13] Hypokalemia [E87.6] Syncope [R55] Sinus bradycardia [R00.1] Gallstones [K80.20] Symptomatic anemia [D64.9] Patient Active Problem List   Diagnosis Date Noted   Constipation 03/24/2022   Syncope, vasovagal 03/23/2022   Syncope 03/23/2022   Frequent falls with increased weakness 03/06/2022   Chronic diastolic CHF (congestive heart failure) ([place]) 03/06/2022   Memory loss 03/06/2022   Acute on chronic heart failure with preserved ejection fraction (HFpEF) ([place]) 02/04/2022   Hypertensive emergency 02/04/2022   Paroxysmal atrial fibrillation ([place]) 02/04/2022   History of CVA (cerebrovascular accident) 02/04/2022   Left breast mass 02/63/7858   Acute metabolic encephalopathy 85/08/7739   UTI (urinary tract infection) 01/01/2022   Altered mental status    Acute diastolic heart failure ([place]) 11/29/2021   Acute coronary syndrome ([place]) 08/30/2019   TIA (transient ischemic attack)    CVA (cerebral vascular accident) ([place])  05/18/2019   Acute renal failure superimposed on stage 3b chronic kidney disease ([place]) 05/18/2019   History of breast cancer 05/18/2019   History of colon cancer 05/18/2019   Hypochromic anemia 05/18/2019   Rectal cancer ([place]) 12/16/2015   Depression 03/07/2014   Hip fracture ([place]) 02/22/2014   Fall 02/21/2014   Fracture of femoral neck, left ([place]) 02/21/2014   Diarrhea 05/07/2012   Acute pulmonary embolism ([place]) 01/04/2012   Acute pericardial effusion 01/04/2012   Fracture of medial malleolus, left, closed, nondisplaced -12/19/2011 12/22/2011   Fracture of left distal radius - 12/19/2011 12/21/2011   Fracture of lateral malleolus of right ankle - 12/19/2011 12/21/2011   Foot fracture, left - 12/19/2011 12/21/2011   Closed fracture of distal clavicle 12/21/2011   PERIPHERAL VASCULAR DISEASE 03/17/2008   GASTRITIS 11/29/2007   ADENOCARCINOMA, BREAST 10/16/2007   VENOUS INSUFFICIENCY 10/16/2007   Hypothyroidism 04/23/2007   ANXIETY 04/23/2007   Essential hypertension 28/78/6767   Diastolic dysfunction, left ventricle as evidenced by moderate LVH on echocardiogram June 2013 04/23/2007   GERD 04/23/2007   PCP:  [name], MD  Pharmacy:   [place], [place] [address] Phone: [phone] Fax: [phone]     Social Determinants of Health (SDOH) Interventions    Readmission Risk Interventions     No data to display

## 2022-03-25 DIAGNOSIS — N170 Acute kidney failure with tubular necrosis: Secondary | ICD-10-CM | POA: Diagnosis not present

## 2022-03-25 DIAGNOSIS — R1013 Epigastric pain: Secondary | ICD-10-CM | POA: Diagnosis not present

## 2022-03-25 DIAGNOSIS — I5032 Chronic diastolic (congestive) heart failure: Secondary | ICD-10-CM | POA: Diagnosis not present

## 2022-03-25 DIAGNOSIS — R55 Syncope and collapse: Secondary | ICD-10-CM | POA: Diagnosis not present

## 2022-03-25 LAB — T4, FREE: Free T4: 0.97 ng/dL (ref 0.61–1.12)

## 2022-03-25 LAB — TSH: TSH: 26.084 u[IU]/mL — ABNORMAL HIGH (ref 0.350–4.500)

## 2022-03-25 MED ORDER — SACCHAROMYCES BOULARDII 250 MG PO CAPS: 250.0000 mg | ORAL_CAPSULE | Freq: Two times a day (BID) | ORAL | Status: AC

## 2022-03-25 MED ORDER — DOCUSATE SODIUM 100 MG PO CAPS: 100.0000 mg | ORAL_CAPSULE | Freq: Two times a day (BID) | ORAL | 2 refills | Status: AC

## 2022-03-25 MED ORDER — FOLIC ACID 1 MG PO TABS: 1.0000 mg | ORAL_TABLET | Freq: Every day | ORAL | Status: AC

## 2022-03-25 MED ORDER — FOSFOMYCIN TROMETHAMINE 3 G PO PACK
3.0000 g | PACK | Freq: Once | ORAL | Status: AC
  Administered 2022-03-25: 3 g via ORAL
  Filled 2022-03-25: qty 3

## 2022-03-25 MED ORDER — LEVOTHYROXINE SODIUM 88 MCG PO TABS: 88.0000 ug | ORAL_TABLET | Freq: Every day | ORAL | Status: DC

## 2022-03-25 MED ORDER — LEVOTHYROXINE SODIUM 88 MCG PO TABS: 88.0000 ug | ORAL_TABLET | Freq: Every day | ORAL | Status: AC

## 2022-03-25 MED ORDER — CIPROFLOXACIN HCL 500 MG PO TABS: 500.0000 mg | ORAL_TABLET | Freq: Two times a day (BID) | ORAL | 0 refills | Status: DC

## 2022-03-25 MED ORDER — POLYETHYLENE GLYCOL 3350 17 G PO PACK: 17.0000 g | PACK | Freq: Every day | ORAL | 0 refills | Status: AC

## 2022-03-25 NOTE — TOC Progression Note (Signed)
Transition of Care [facility]) - Initial/Assessment Note    Patient Details  Name: [name] MRN: [id number] Date of Birth: [birth year]  Transition of Care [facility]) CM/SW Contact:    Milinda Antis, [place] Phone Number: 03/25/2022, 10:18 AM  Clinical Narrative:                 CSW contacted [facility] to informed the facility that the patient is medically ready to discharge and to request transfer information.  CSW is awaiting a response.  Discharge pending: discharge summary and transfer information from SNF.         Patient Goals and CMS Choice        Expected Discharge Plan and Services           Expected Discharge Date: 03/25/22                                    Prior Living Arrangements/Services                       Activities of Daily Living      Permission Sought/Granted                  Emotional Assessment              Admission diagnosis:  Abdominal pain, epigastric [R10.13] Hypokalemia [E87.6] Syncope [R55] Sinus bradycardia [R00.1] Gallstones [K80.20] Symptomatic anemia [D64.9] Patient Active Problem List   Diagnosis Date Noted   Constipation 03/24/2022   Syncope, vasovagal 03/23/2022   Syncope 03/23/2022   Frequent falls with increased weakness 03/06/2022   Chronic diastolic CHF (congestive heart failure) ([place]) 03/06/2022   Memory loss 03/06/2022   Acute on chronic heart failure with preserved ejection fraction (HFpEF) ([place]) 02/04/2022   Hypertensive emergency 02/04/2022   Paroxysmal atrial fibrillation ([place]) 02/04/2022   History of CVA (cerebrovascular accident) 02/04/2022   Left breast mass 78/29/5621   Acute metabolic encephalopathy 30/86/5784   UTI (urinary tract infection) 01/01/2022   Altered mental status    Acute diastolic heart failure ([place]) 11/29/2021   Acute coronary syndrome ([place]) 08/30/2019   TIA (transient ischemic attack)    CVA (cerebral vascular accident) ([place]) 05/18/2019   Acute renal  failure superimposed on stage 3b chronic kidney disease ([place]) 05/18/2019   History of breast cancer 05/18/2019   History of colon cancer 05/18/2019   Hypochromic anemia 05/18/2019   Rectal cancer ([place]) 12/16/2015   Depression 03/07/2014   Hip fracture ([place]) 02/22/2014   Fall 02/21/2014   Fracture of femoral neck, left ([place]) 02/21/2014   Diarrhea 05/07/2012   Acute pulmonary embolism ([place]) 01/04/2012   Acute pericardial effusion 01/04/2012   Fracture of medial malleolus, left, closed, nondisplaced -12/19/2011 12/22/2011   Fracture of left distal radius - 12/19/2011 12/21/2011   Fracture of lateral malleolus of right ankle - 12/19/2011 12/21/2011   Foot fracture, left - 12/19/2011 12/21/2011   Closed fracture of distal clavicle 12/21/2011   PERIPHERAL VASCULAR DISEASE 03/17/2008   GASTRITIS 11/29/2007   ADENOCARCINOMA, BREAST 10/16/2007   VENOUS INSUFFICIENCY 10/16/2007   Hypothyroidism 04/23/2007   ANXIETY 04/23/2007   Essential hypertension 69/62/9528   Diastolic dysfunction, left ventricle as evidenced by moderate LVH on echocardiogram June 2013 04/23/2007   GERD 04/23/2007   PCP:  [name], MD Pharmacy:   [place], [place]  [place] [address] Phone: [phone] Fax: [phone]     Social Determinants of Health (SDOH) Interventions    Readmission Risk Interventions     No data to display

## 2022-03-25 NOTE — TOC Transition Note (Signed)
Transition of Care [facility]) - CM/SW Discharge Note   Patient Details  Name: [name] MRN: [id number] Date of Birth: 08/09/1924  [facility]) CM/SW Contact:  Milinda Antis, [place] Phone Number: 03/25/2022, 12:37 PM   Clinical Narrative:    Patient will DC to:  [facility] Anticipated DC date:  03/25/2022 Family notified:  Yes Transport by:  personal vehicle (daughter)   Per MD patient ready for DC to SNF. RN to call report prior to discharge ([phone] room 405P). RN,  patient's family, and facility notified of DC. Discharge Summary sent to facility. DC packet on chart. Patient's daughter will transport the patient.   CSW will sign off for now as social work intervention is no longer needed. Please consult [place] again if new needs arise.     Final next level of care: Skilled Nursing Facility Barriers to Discharge: Barriers Resolved   Patient Goals and CMS Choice        Discharge Placement              Patient chooses bed at:  [facility]) Patient to be transferred to facility by: [place] Name of family member notified: Dorothe Pea, Daughter Patient and family notified of of transfer: 03/25/22  Discharge Plan and Services                                     Social Determinants of Health (SDOH) Interventions     Readmission Risk Interventions     No data to display

## 2022-03-25 NOTE — Progress Notes (Signed)
[name] to be D/C'd  per MD order.  Discussed with Lanelle Bal at [facility] the discharge information and all question answer.  VSS, Skin clean, dry and intact without evidence of skin break down, no evidence of skin tears noted.  IV catheter discontinued intact. Site without signs and symptoms of complications. Dressing and pressure applied.  An After Visit Summary was printed and given to the daughter to give to [facility].  Patient instructed to return to ED, call 911, or call MD for any changes in condition.   Patient to be escorted via [place], and D/C home via private auto.

## 2022-03-25 NOTE — Progress Notes (Addendum)
Pharmacy Miscellaneous Discharge Note  Patient being discharged on ciprofloxacin for concern of UTI. Discussed with Dr. Tana Coast and will give fosfomycin while inpatient and cancel ciprofloxacin discharge prescription. Prescription already sent to [facility]. [facility] and verbally cancelled prescription with pharmacist (not yet received but [place] to cancel when receives - likely in process of being transmitted since sent recently). Called [facility] and left message for nurse supervisor to inform her of plan to give fosfomycin here and no need to receive ciprofloxacin at their facility. Asked [facility] to call back to confirm messaged received.   Cristela Felt, PharmD, BCPS Clinical Pharmacist 03/25/2022 10:33 AM

## 2022-03-25 NOTE — Discharge Summary (Addendum)
Physician Discharge Summary   Patient: [name] MRN: [id number] DOB: Aug 31, 1924  Admit date:     03/23/2022  Discharge date: 03/25/22  Discharge Physician: Estill Cotta, MD    PCP: [name], MD   Recommendations at discharge:   Patient received fosfomycin dose while inpatient before discharge, does not need any further antibiotics at the facility Needs to have good daily bowel regimen for constipation Continue MiraLAX 17 g daily Continue Colace 100 mg p.o. twice daily Continue Dulcolax suppository as needed, Fleet enema as needed (for severe constipation)  Discharge Diagnoses:    Syncope   Frequent falls with increased weakness   Pseudomonas UTI (urinary tract infection)   Severe constipation   Stage 3b chronic kidney disease (HCC)   Paroxysmal atrial fibrillation (HCC)   Hypothyroidism   Chronic diastolic CHF (congestive heart failure) ([place])   Essential hypertension   GERD   History of CVA (cerebrovascular accident) Dementia Folic acid deficiency   Hospital Course:    Patient is a 88 year old female with history of  breast CA s/p mastectomy with concerning recurrent breast lesions, chronic HFpEF, GERD, HTN, hypothyroidism, CVA, PAF not on anticoagulation secondary to frequent falls, CKD stage IIIa, recent Pseudomonas UTI, memory loss sent from nursing home for evaluation of bradycardia and near syncope.  Patient had reported abdominal pain, cramping-like for some time, no nausea vomiting or diarrhea.  She has chronic constipation, described pain as epigastric cramping, no fevers or chills, cough or shortness of breath.  On the day of admission, patient was found looking pale and bradycardia with heart rate in 30s, BP dropped to 90s.  Patient did not remember what had happened. Heart rate in 60s in ED, SBP 150s to 170s, chest x-ray showed no pneumonia.  UA positive for UTI. CT abdomen and pelvis showed inflammation of bladder wall, moderate stool  burden.   Assessment and Plan:  Near syncope, likely vasovagal -Likely vasovagal, orthostatics negative.  -BP now elevated, started on Norvasc 5 mg daily    Constipation, abdominal pain -No significant acute findings on CT abdomen other than moderate stool burden -Patient had enema in the ED and had BM.  Placed on bowel regimen, continue MiraLAX 17 g daily, Colace 100 mg twice daily, Dulcolax suppository as needed -Fleet enema as needed for severe constipation.       Frequent falls with increased weakness -Continue PT OT at the facility     UTI (urinary tract infection) -Multiple episodes of UTIs this year with Klebsiella in July, Pseudomonas 2 weeks PTA.  May need chronic suppression antibiotic treatment outpatient, will defer to PCP - Urine culture and sensitivities positive for Pseudomonas, patient was placed on IV cefepime while inpatient, will give 1 dose of fosfomycin prior to discharge.  Does not need any further antibiotics.   stage 3b chronic kidney disease ([place]) -Baseline creatinine 1.0-1.1 -Presented with creatinine of 0.6, currently 1.0 at baseline     Paroxysmal atrial fibrillation (HCC) with bradycardia  -Heart rate mentioned in 30s, bradycardia at the SNF, currently in 60s -For now continue amiodarone, however metoprolol has been held, continue Norvasc for hypertension -Not on anticoagulation due to falls.     Hypothyroidism Continue Synthroid, check TSH 75 mcg daily -TSH 36.4 on 03/06/2022, free T4 0.6  -Recent admission 9/3- 9/7, patient was noted to have stopped taking Synthroid and TSH was elevated, patient was restarted on Synthroid on home dose. -TSH 26.0, free T4 0.9, increased Synthroid to 88 mcg daily  Chronic diastolic CHF (congestive heart failure) (HCC) -Euvolemic, patient was placed on gentle hydration --On Lasix 20 mg daily as needed for fluid or edema, continue   Hypokalemia -Resolved   Normocytic anemia, folic acid  deficiency -Hemoglobin 8.3 on admission, no obvious bleeding.  Hemoglobin 11.7 on 9/6, -improved to 11.2 today -Recent iron studies showed low folate 5.3 - placed on folic acid 1 mg daily    History of CVA -Continue Plavix   GERD Continue PPI   History of dementia, memory issues -Continue Seroquel at bedtime       Pain control - [facility] Controlled Substance Reporting System database was reviewed. and patient was instructed, not to drive, operate heavy machinery, perform activities at heights, swimming or participation in water activities or provide baby-sitting services while on Pain, Sleep and Anxiety Medications; until their outpatient Physician has advised to do so again. Also recommended to not to take more than prescribed Pain, Sleep and Anxiety Medications.  Consultants: None Procedures performed: None Disposition: Skilled nursing facility Diet recommendation:  Discharge Diet Orders (From admission, onward)     Start     Ordered   03/25/22 0000  Diet - low sodium heart healthy        03/25/22 0952           Regular diet DISCHARGE MEDICATION: Allergies as of 03/25/2022       Reactions   Codeine Itching, Anxiety, Other (See Comments)   Caused the patient to feel nervous/confused.   Pregabalin Anxiety, Other (See Comments)   Caused nervousness        Medication List     STOP taking these medications    metoprolol tartrate 25 MG tablet Commonly known as: LOPRESSOR       TAKE these medications    acetaminophen 500 MG tablet Commonly known as: TYLENOL Take 1,000 mg by mouth every 6 (six) hours as needed for headache (pain).   amiodarone 200 MG tablet Commonly known as: PACERONE Take 200 mg by mouth daily at 12 noon.   amLODipine 5 MG tablet Commonly known as: NORVASC Take 5 mg by mouth daily.   atorvastatin 40 MG tablet Commonly known as: LIPITOR Take 40 mg by mouth every evening.   bisacodyl 10 MG suppository Commonly known as:  DULCOLAX Place 10 mg rectally daily as needed for moderate constipation.   clopidogrel 75 MG tablet Commonly known as: PLAVIX Take 1 tablet (75 mg total) by mouth daily. What changed: when to take this   cyanocobalamin 1000 MCG tablet Take 1 tablet (1,000 mcg total) by mouth daily.   diclofenac sodium 1 % Gel Commonly known as: VOLTAREN APPLY 2-4 GRAMS TO AFFECTED AREA 2-3 TIMES A DAY AS NEEDED. What changed: See the new instructions.   docusate sodium 100 MG capsule Commonly known as: Colace Take 1 capsule (100 mg total) by mouth 2 (two) times daily.   esomeprazole 40 MG capsule Commonly known as: NEXIUM Take 40 mg by mouth every morning.   folic acid 1 MG tablet Commonly known as: FOLVITE Take 1 tablet (1 mg total) by mouth daily. Start taking on: March 26, 2022   furosemide 40 MG tablet Commonly known as: LASIX Take 0.5 tablets (20 mg total) by mouth daily as needed for fluid or edema.   ipratropium-albuterol 0.5-2.5 (3) MG/3ML Soln Commonly known as: DUONEB Take 3 mLs by nebulization every 4 (four) hours as needed (wheezing shortness of breath).   isosorbide mononitrate 30 MG 24 hr tablet Commonly known  as: IMDUR Take 30 mg by mouth every morning.   levothyroxine 88 MCG tablet Commonly known as: SYNTHROID Take 1 tablet (88 mcg total) by mouth daily before breakfast. What changed:  medication strength how much to take when to take this   losartan 25 MG tablet Commonly known as: COZAAR Take 25 mg by mouth daily.   melatonin 3 MG Tabs tablet Take 3 mg by mouth at bedtime as needed (sleep). X 14 days stop date 03-24-22   Multivitamin Adults 50+ Tabs Take 1 tablet by mouth daily.   Mylanta Maximum Strength 400-400-40 MG/5ML suspension Generic drug: alum & mag hydroxide-simeth Take 30 mLs by mouth every 6 (six) hours as needed for indigestion.   nitroGLYCERIN 0.4 MG SL tablet Commonly known as: NITROSTAT Place 1 tablet (0.4 mg total) under the tongue  every 5 (five) minutes x 3 doses as needed for chest pain.   polyethylene glycol 17 g packet Commonly known as: MIRALAX / GLYCOLAX Take 17 g by mouth daily. Start taking on: March 26, 2022   potassium chloride SA 20 MEQ tablet Commonly known as: Klor-Con M20 Take 1 tablet (20 mEq total) by mouth every Monday, Wednesday, and Friday. What changed:  when to take this additional instructions   QUEtiapine 25 MG tablet Commonly known as: SEROQUEL Take 25 mg by mouth at bedtime.   saccharomyces boulardii 250 MG capsule Commonly known as: Florastor Take 1 capsule (250 mg total) by mouth 2 (two) times daily for 7 days. Or any probiotic available   Systane 0.4-0.3 % Gel ophthalmic gel Generic drug: Polyethyl Glycol-Propyl Glycol Place 1 application into both eyes at bedtime.        Contact information for follow-up providers     [name], MD. Schedule an appointment as soon as possible for a visit in 2 week(s).   Specialty: Internal Medicine Why: for hospital follow-up Contact information: [address] [place] [address] [phone]              Contact information for after-discharge care     Destination     [facility] .   Service: Skilled Nursing Contact information: [address] [place] [phone]                    Discharge Exam: Danley Danker Weights   03/23/22 1159  Weight: 70 kg   S: No acute complaints, eating breakfast without any difficulty.  No chest pain or shortness of breath  Vitals:   03/24/22 2055 03/24/22 2100 03/25/22 0641 03/25/22 0726  BP: (!) 175/72 (!) 169/73 (!) 154/76 (!) 167/61  Pulse: 61 61 60 63  Resp: '16  15 16  '$ Temp: 98.2 F (36.8 C)  98.2 F (36.8 C) 98.3 F (36.8 C)  TempSrc: Oral  Oral Oral  SpO2: 97%  98% 99%  Weight:      Height:        Physical Exam General: Alert and oriented x 3, NAD Cardiovascular: S1 S2 clear, RRR.   Respiratory: CTAB, no wheezing, rales or rhonchi Gastrointestinal: Soft, nontender, nondistended, NBS Ext: no pedal edema bilaterally Neuro: no new deficits Psych: Normal affect and demeanor, pleasant   Condition at discharge: good  The results of significant diagnostics from this hospitalization (including imaging, microbiology, ancillary and laboratory) are listed below for reference.   Imaging Studies: CT ABDOMEN PELVIS W CONTRAST  Result Date: 03/23/2022 CLINICAL DATA:  Abdominal pain, acute, nonlocalized upper abd pain  EXAM: CT ABDOMEN AND PELVIS WITH CONTRAST TECHNIQUE: Multidetector CT imaging of the abdomen and pelvis was performed using the standard protocol following bolus administration of intravenous contrast. RADIATION DOSE REDUCTION: This exam was performed according to the departmental dose-optimization program which includes automated exposure control, adjustment of the mA and/or kV according to patient size and/or use of iterative reconstruction technique. CONTRAST:  96m OMNIPAQUE IOHEXOL 350 MG/ML SOLN COMPARISON:  CT 01/25/2022. FINDINGS: Lower chest: Unchanged cardiomegaly. There are bibasilar reticular opacities, interlobular septal thickening and mild ground-glass, similar to prior exam. Mitral annular calcifications. Apical left ventricular myocardial thinning, unchanged. Hepatobiliary: No focal liver abnormality. Layering hyperdensity within a nondilated gallbladder, likely mixture of sludge and stones. There is unchanged mild prominence of the intra and extrahepatic biliary ducts. Pancreas: No pancreatic ductal dilation or surrounding inflammatory change. Spleen: Normal in size without focal abnormality. Adrenals/Urinary Tract: Adrenal glands are unremarkable. No hydronephrosis or nephroureterolithiasis. Tiny renal cysts which require no follow-up imaging. The bladder is moderately distended with circumferential wall thickening. Stomach/Bowel: Moderate size hiatal hernia.  There are postsurgical changes in the region of the esophageal hiatus. There is no evidence of bowel obstruction.The appendix is not definitively identified. There is a significant stool burden in the colon. Postsurgical changes at the rectosigmoid junction. Sigmoid diverticulosis. Vascular/Lymphatic: Aortoiliac atherosclerosis. No AAA. No lymphadenopathy. Reproductive: Prior hysterectomy. Other: No abdominal wall hernia or abnormality. No abdominopelvic ascites. Musculoskeletal: No acute osseous abnormality. No suspicious osseous lesion. Prior left hip arthroplasty with associated streak artifact. Multilevel degenerative changes of the spine. Old rib injuries noted. Metallic densities overlie the right lower chest wall, unchanged from prior. IMPRESSION: Moderate bladder distension with circumferential wall thickening, can be seen with cystitis, correlate with urinalysis. Significant stool burden in the colon suggesting constipation. Cardiomegaly with findings of mild pulmonary edema in the lung bases. Moderate-sized hiatal hernia.  Cholelithiasis. Sigmoid diverticulosis. Electronically Signed   By: JMaurine SimmeringM.D.   On: 03/23/2022 14:03   DG Chest 2 View  Result Date: 03/23/2022 CLINICAL DATA:  Hypotension with bradycardia. EXAM: CHEST - 2 VIEW COMPARISON:  Radiographs 03/06/2022, 03/04/2022 and 02/28/2022. CT 02/16/2022. FINDINGS: Stable cardiomegaly and aortic atherosclerosis. There is chronic subpleural reticulation in both lungs without evidence of edema, focal airspace disease, pleural effusion or pneumothorax. There are surgical clips in the right breast and right axilla. Moderately advanced glenohumeral degenerative changes are present bilaterally. There is an old mid right clavicle fracture and mild thoracic spondylosis. Numerous telemetry leads overlie the chest. IMPRESSION: Stable cardiomegaly and chronic pulmonary subpleural reticulation. No acute cardiopulmonary process. Electronically Signed   By:  WRichardean SaleM.D.   On: 03/23/2022 13:31   CT HEAD WO CONTRAST (5MM)  Result Date: 03/06/2022 CLINICAL DATA:  Head trauma, moderate-severe. Fall. History of dementia. EXAM: CT HEAD WITHOUT CONTRAST TECHNIQUE: Contiguous axial images were obtained from the base of the skull through the vertex without intravenous contrast. RADIATION DOSE REDUCTION: This exam was performed according to the departmental dose-optimization program which includes automated exposure control, adjustment of the mA and/or kV according to patient size and/or use of iterative reconstruction technique. COMPARISON:  Head CT 02/05/2022 and MRI 01/01/2022 FINDINGS: Brain: There is no evidence of an acute infarct, intracranial hemorrhage, mass, midline shift, or extra-axial fluid collection. Patchy hypodensities in the cerebral white matter bilaterally are unchanged and nonspecific but compatible with mild chronic small vessel ischemic disease, and small chronic right cerebellar infarcts are again seen. There is moderate cerebral atrophy. Heterogeneous hypoattenuation of the basal ganglia  corresponds to dilated perivascular spaces on MRI. Scattered chronic sulcal calcifications are again noted. Vascular: Calcified atherosclerosis at the skull base. Skull: No fracture or suspicious osseous lesion. Sinuses/Orbits: Visualized paranasal sinuses and mastoid air cells are clear. Bilateral cataract extraction. Other: None. IMPRESSION: 1. No evidence of acute intracranial abnormality. 2. Chronic small vessel ischemic disease and cerebral atrophy. Electronically Signed   By: Logan Bores M.D.   On: 03/06/2022 20:04   DG Knee Complete 4 Views Right  Result Date: 03/06/2022 CLINICAL DATA:  Right knee pain after fall. EXAM: RIGHT KNEE - COMPLETE 4+ VIEW COMPARISON:  None Available. FINDINGS: No evidence of fracture, dislocation, or joint effusion. Severe narrowing of medial joint space is noted. Chondrocalcinosis is noted laterally. Soft tissues are  unremarkable. IMPRESSION: Severe degenerative joint disease.  No acute abnormality seen. Electronically Signed   By: Marijo Conception M.D.   On: 03/06/2022 13:36   DG Chest Port 1 View  Result Date: 03/06/2022 CLINICAL DATA:  Fall. EXAM: PORTABLE CHEST 1 VIEW COMPARISON:  March 04, 2022. FINDINGS: Stable cardiomegaly. Mild bibasilar subsegmental atelectasis or scarring is noted. Old right clavicular fracture. IMPRESSION: Mild bibasilar subsegmental atelectasis or scarring. Aortic Atherosclerosis (ICD10-I70.0). Electronically Signed   By: Marijo Conception M.D.   On: 03/06/2022 13:34   DG Pelvis 1-2 Views  Result Date: 03/06/2022 CLINICAL DATA:  Fall. EXAM: PELVIS - 1-2 VIEW COMPARISON:  February 22, 2014. FINDINGS: There is no evidence of pelvic fracture or diastasis. Status post left hip arthroplasty. No pelvic bone lesions are seen. IMPRESSION: No acute abnormality is noted. Electronically Signed   By: Marijo Conception M.D.   On: 03/06/2022 13:33   DG Chest 2 View  Result Date: 03/04/2022 CLINICAL DATA:  Shoulder pain. EXAM: CHEST - 2 VIEW COMPARISON:  February 28, 2022. FINDINGS: Stable cardiomediastinal silhouette. Old right clavicular fracture is noted. Surgical clips are noted in right axillary region. Mild bibasilar subsegmental atelectasis or scarring is noted. Bony thorax is unremarkable. IMPRESSION: Mild bibasilar subsegmental atelectasis or scarring. Aortic Atherosclerosis (ICD10-I70.0). Electronically Signed   By: Marijo Conception M.D.   On: 03/04/2022 13:05   DG Hip Unilat W or Wo Pelvis 2-3 Views Right  Result Date: 03/04/2022 CLINICAL DATA:  Right hip pain. EXAM: DG HIP (WITH OR WITHOUT PELVIS) 2-3V RIGHT COMPARISON:  None Available. FINDINGS: There is no evidence of hip fracture or dislocation. There is no evidence of arthropathy or other focal bone abnormality. IMPRESSION: Negative. Electronically Signed   By: Marijo Conception M.D.   On: 03/04/2022 13:03   DG Shoulder Left  Result Date:  03/04/2022 CLINICAL DATA:  Left shoulder pain. EXAM: LEFT SHOULDER - 2+ VIEW COMPARISON:  March 10, 2020. FINDINGS: There is no definite evidence of fracture. Probable severe degenerative changes seen involving the glenohumeral joint as noted on prior exam. Probable mild anterior subluxation of the humeral head relative to glenoid fossa is noted as well. IMPRESSION: No definite fracture is noted. Severe osteoarthritis of the glenohumeral joint. Probable mild anterior subluxation of the humeral head relative to glenoid fossa. Electronically Signed   By: Marijo Conception M.D.   On: 03/04/2022 13:01   DG Knee Complete 4 Views Right  Result Date: 03/04/2022 CLINICAL DATA:  Right knee pain. EXAM: RIGHT KNEE - COMPLETE 4+ VIEW COMPARISON:  January 26, 2022. FINDINGS: No fracture or dislocation is noted. Small suprapatellar joint effusion is noted. Severe narrowing is seen involving the medial joint space with osteophyte formation. Mild  narrowing and chondrocalcinosis is noted laterally. IMPRESSION: Small suprapatellar joint effusion. Severe degenerative joint disease is noted medially. No fracture or dislocation is noted. Electronically Signed   By: Marijo Conception M.D.   On: 03/04/2022 12:58   DG Chest Port 1 View  Result Date: 02/28/2022 CLINICAL DATA:  Chest pain.  Anxiety. EXAM: PORTABLE CHEST 1 VIEW COMPARISON:  02/26/2022 FINDINGS: Artifact overlies the chest. Chronic cardiomegaly and aortic atherosclerosis. Mildly prominent chronic interstitial lung markings. No evidence of active infiltrate, mass, effusion or collapse. Surgical clips of the right chest wall. Chronic degenerative changes of the shoulders left worse than right. IMPRESSION: No active disease. Cardiomegaly. Aortic atherosclerosis. Chronic pulmonary scarring. Electronically Signed   By: Nelson Chimes M.D.   On: 02/28/2022 13:04   DG Chest 2 View  Result Date: 02/26/2022 CLINICAL DATA:  Shortness of breath EXAM: CHEST - 2 VIEW COMPARISON:   02/24/2022 FINDINGS: Lungs are clear.  No pleural effusion or pneumothorax. The heart is top-normal in size.  Thoracic aortic atherosclerosis. Degenerative changes of the left shoulder. Surgical clips along the right lateral chest wall/axilla. Mild degenerative changes of the visualized thoracolumbar spine. IMPRESSION: No evidence of acute cardiopulmonary disease. Electronically Signed   By: Julian Hy M.D.   On: 02/26/2022 18:34   DG Chest 1 View  Result Date: 02/24/2022 CLINICAL DATA:  Chest pain, epigastric pain. Pain radiates to left arm. EXAM: CHEST  1 VIEW COMPARISON:  Chest radiograph 02/18/2022, chest CT 02/16/2022 FINDINGS: Stable cardiomegaly. Unchanged mediastinal contours with aortic atherosclerosis. There is new interstitial and septal thickening typical of pulmonary edema. No confluent airspace disease or large pleural effusion. There is mild atelectasis at the right lung base. Chronic deformity of the left proximal humerus. Right axillary surgical clips. IMPRESSION: New interstitial and septal thickening typical of pulmonary edema. Stable cardiomegaly. Electronically Signed   By: Keith Rake M.D.   On: 02/24/2022 18:03    Microbiology: Results for orders placed or performed during the hospital encounter of 03/06/22  Urine Culture     Status: Abnormal   Collection Time: 03/06/22  4:20 PM   Specimen: Urine, Clean Catch  Result Value Ref Range Status   Specimen Description URINE, CLEAN CATCH  Final   Special Requests   Final    Normal Performed at [facility], [place] [address] [place], [place] [address]    Culture >=100,000 COLONIES/mL PSEUDOMONAS AERUGINOSA (A)  Final   Report Status 03/10/2022 FINAL  Final   Organism ID, Bacteria PSEUDOMONAS AERUGINOSA (A)  Final      Susceptibility   Pseudomonas aeruginosa - MIC*    CEFTAZIDIME 4 SENSITIVE Sensitive     CIPROFLOXACIN <=0.25 SENSITIVE Sensitive     GENTAMICIN <=1 SENSITIVE Sensitive     IMIPENEM 1 SENSITIVE  Sensitive     PIP/TAZO 8 SENSITIVE Sensitive     CEFEPIME 2 SENSITIVE Sensitive     * >=100,000 COLONIES/mL PSEUDOMONAS AERUGINOSA    Labs: CBC: Recent Labs  Lab 03/23/22 1218 03/23/22 2200 03/24/22 0112  WBC 8.0  --  9.3  HGB 8.3* 11.3* 11.2*  HCT 27.4* 35.6* 35.6*  MCV 88.4  --  85.0  PLT 185  --  242   Basic Metabolic Panel: Recent Labs  Lab 03/23/22 1218 03/23/22 1411 03/24/22 0112  NA 137  --  134*  K 3.3*  --  5.1  CL 115*  --  105  CO2 19*  --  23  GLUCOSE 78  --  110*  BUN 10  --  12  CREATININE 0.60  --  1.05*  CALCIUM 6.7*  --  9.6  MG  --  2.2  --    Liver Function Tests: Recent Labs  Lab 03/23/22 1218  AST 21  ALT 20  ALKPHOS 30*  BILITOT 0.4  PROT 4.3*  ALBUMIN 2.1*   CBG: No results for input(s): "GLUCAP" in the last 168 hours.  Discharge time spent: greater than 30 minutes.  Signed: Estill Cotta, MD [facility] 03/25/2022

## 2022-03-29 DIAGNOSIS — E039 Hypothyroidism, unspecified: Secondary | ICD-10-CM | POA: Diagnosis not present

## 2022-03-29 DIAGNOSIS — R112 Nausea with vomiting, unspecified: Secondary | ICD-10-CM | POA: Diagnosis not present

## 2022-03-29 DIAGNOSIS — E860 Dehydration: Secondary | ICD-10-CM | POA: Diagnosis not present

## 2022-03-29 DIAGNOSIS — F0390 Unspecified dementia without behavioral disturbance: Secondary | ICD-10-CM | POA: Diagnosis not present

## 2022-03-30 DIAGNOSIS — E875 Hyperkalemia: Secondary | ICD-10-CM | POA: Diagnosis not present

## 2022-03-30 DIAGNOSIS — J189 Pneumonia, unspecified organism: Secondary | ICD-10-CM | POA: Diagnosis not present

## 2022-03-30 DIAGNOSIS — N1832 Chronic kidney disease, stage 3b: Secondary | ICD-10-CM | POA: Diagnosis not present

## 2022-03-30 DIAGNOSIS — E871 Hypo-osmolality and hyponatremia: Secondary | ICD-10-CM | POA: Diagnosis not present

## 2022-03-30 DIAGNOSIS — D72829 Elevated white blood cell count, unspecified: Secondary | ICD-10-CM | POA: Diagnosis not present

## 2022-04-03 DEATH — deceased
